# Patient Record
Sex: Male | Born: 1970 | Race: White | Hispanic: No | Marital: Married | Smoking: Former smoker
Health system: Southern US, Community
[De-identification: ages and names within clinical notes are randomized; demographics above are authoritative.]

## PROBLEM LIST (undated history)

## (undated) DIAGNOSIS — I639 Cerebral infarction, unspecified: Secondary | ICD-10-CM

## (undated) DIAGNOSIS — F909 Attention-deficit hyperactivity disorder, unspecified type: Secondary | ICD-10-CM

## (undated) DIAGNOSIS — Z87442 Personal history of urinary calculi: Secondary | ICD-10-CM

## (undated) DIAGNOSIS — E119 Type 2 diabetes mellitus without complications: Secondary | ICD-10-CM

## (undated) DIAGNOSIS — F329 Major depressive disorder, single episode, unspecified: Secondary | ICD-10-CM

## (undated) DIAGNOSIS — F32A Depression, unspecified: Secondary | ICD-10-CM

## (undated) DIAGNOSIS — F419 Anxiety disorder, unspecified: Secondary | ICD-10-CM

## (undated) DIAGNOSIS — I509 Heart failure, unspecified: Secondary | ICD-10-CM

## (undated) HISTORY — DX: Major depressive disorder, single episode, unspecified: F32.9

## (undated) HISTORY — DX: Anxiety disorder, unspecified: F41.9

## (undated) HISTORY — PX: CYSTOSCOPY WITH URETEROSCOPY, STONE BASKETRY AND STENT PLACEMENT: SHX6378

## (undated) HISTORY — DX: Depression, unspecified: F32.A

---

## 2004-03-18 ENCOUNTER — Emergency Department (HOSPITAL_COMMUNITY): Admission: EM | Admit: 2004-03-18 | Discharge: 2004-03-18 | Payer: Self-pay

## 2004-04-15 ENCOUNTER — Ambulatory Visit (HOSPITAL_BASED_OUTPATIENT_CLINIC_OR_DEPARTMENT_OTHER): Admission: RE | Admit: 2004-04-15 | Discharge: 2004-04-15 | Payer: Self-pay | Admitting: Urology

## 2005-03-08 ENCOUNTER — Emergency Department (HOSPITAL_COMMUNITY): Admission: EM | Admit: 2005-03-08 | Discharge: 2005-03-08 | Payer: Self-pay | Admitting: Emergency Medicine

## 2017-01-05 ENCOUNTER — Emergency Department (HOSPITAL_COMMUNITY): Payer: Self-pay

## 2017-01-05 ENCOUNTER — Encounter (HOSPITAL_COMMUNITY): Payer: Self-pay | Admitting: Emergency Medicine

## 2017-01-05 ENCOUNTER — Emergency Department (HOSPITAL_COMMUNITY)
Admission: EM | Admit: 2017-01-05 | Discharge: 2017-01-06 | Disposition: A | Discharge status: Home / Self Care | Payer: Self-pay | Attending: Emergency Medicine | Admitting: Emergency Medicine

## 2017-01-05 DIAGNOSIS — F14150 Cocaine abuse with cocaine-induced psychotic disorder with delusions: Secondary | ICD-10-CM | POA: Insufficient documentation

## 2017-01-05 DIAGNOSIS — Z79899 Other long term (current) drug therapy: Secondary | ICD-10-CM | POA: Insufficient documentation

## 2017-01-05 DIAGNOSIS — R404 Transient alteration of awareness: Secondary | ICD-10-CM | POA: Insufficient documentation

## 2017-01-05 DIAGNOSIS — F17210 Nicotine dependence, cigarettes, uncomplicated: Secondary | ICD-10-CM | POA: Insufficient documentation

## 2017-01-05 LAB — AMMONIA: AMMONIA: 34 umol/L (ref 9–35)

## 2017-01-05 LAB — URINALYSIS, ROUTINE W REFLEX MICROSCOPIC
Bilirubin Urine: NEGATIVE
Glucose, UA: NEGATIVE mg/dL
Hgb urine dipstick: NEGATIVE
KETONES UR: NEGATIVE mg/dL
Leukocytes, UA: NEGATIVE
Nitrite: NEGATIVE
PH: 5 (ref 5.0–8.0)
PROTEIN: 30 mg/dL — AB
SQUAMOUS EPITHELIAL / LPF: NONE SEEN
Specific Gravity, Urine: 1.026 (ref 1.005–1.030)

## 2017-01-05 LAB — CBC WITH DIFFERENTIAL/PLATELET
BASOS ABS: 0 10*3/uL (ref 0.0–0.1)
BASOS PCT: 0 %
EOS ABS: 0 10*3/uL (ref 0.0–0.7)
EOS PCT: 0 %
HCT: 47.6 % (ref 39.0–52.0)
Hemoglobin: 16.4 g/dL (ref 13.0–17.0)
Lymphocytes Relative: 20 %
Lymphs Abs: 3.7 10*3/uL (ref 0.7–4.0)
MCH: 31.1 pg (ref 26.0–34.0)
MCHC: 34.5 g/dL (ref 30.0–36.0)
MCV: 90.3 fL (ref 78.0–100.0)
Monocytes Absolute: 1.4 10*3/uL — ABNORMAL HIGH (ref 0.1–1.0)
Monocytes Relative: 8 %
Neutro Abs: 13.4 10*3/uL — ABNORMAL HIGH (ref 1.7–7.7)
Neutrophils Relative %: 72 %
PLATELETS: 300 10*3/uL (ref 150–400)
RBC: 5.27 MIL/uL (ref 4.22–5.81)
RDW: 13.7 % (ref 11.5–15.5)
WBC: 18.5 10*3/uL — AB (ref 4.0–10.5)

## 2017-01-05 LAB — COMPREHENSIVE METABOLIC PANEL
ALBUMIN: 4.9 g/dL (ref 3.5–5.0)
ALT: 36 U/L (ref 17–63)
AST: 31 U/L (ref 15–41)
Alkaline Phosphatase: 82 U/L (ref 38–126)
Anion gap: 11 (ref 5–15)
BUN: 11 mg/dL (ref 6–20)
CHLORIDE: 106 mmol/L (ref 101–111)
CO2: 22 mmol/L (ref 22–32)
Calcium: 9.4 mg/dL (ref 8.9–10.3)
Creatinine, Ser: 1.03 mg/dL (ref 0.61–1.24)
GFR calc Af Amer: >60 mL/min (ref >60)
GLUCOSE: 123 mg/dL — AB (ref 65–99)
POTASSIUM: 3.9 mmol/L (ref 3.5–5.1)
Sodium: 139 mmol/L (ref 135–145)
Total Bilirubin: 0.7 mg/dL (ref 0.3–1.2)
Total Protein: 8.3 g/dL — ABNORMAL HIGH (ref 6.5–8.1)

## 2017-01-05 LAB — RAPID URINE DRUG SCREEN, HOSP PERFORMED
AMPHETAMINES: NOT DETECTED
BENZODIAZEPINES: NOT DETECTED
Barbiturates: NOT DETECTED
Cocaine: POSITIVE — AB
OPIATES: NOT DETECTED
TETRAHYDROCANNABINOL: POSITIVE — AB

## 2017-01-05 LAB — ETHANOL: Alcohol, Ethyl (B): <5 mg/dL (ref <5)

## 2017-01-05 NOTE — ED Provider Notes (Signed)
[facility]-EMERGENCY DEPT Provider Note   CSN: [id number] Arrival date & time: 01/05/17  2041     History   Chief Complaint Chief Complaint  Patient presents with  . Altered Mental Status    HPI Angel Davies is a 46 y.o. male.  The history is provided by the patient.  Altered Mental Status   This is a new problem. The current episode started 3 to 5 hours ago. The problem has been gradually improving. Associated symptoms include confusion. Risk factors include illicit drug use. His past medical history is significant for psychotropic medication treatment and head trauma (patient reports hitting his head on the banister approximately 1-2pm.). His past medical history does not include seizures, CVA, TIA, AIDS or hypertension.   Last known normal at 1500pm today by her wife. Patient and wife endorsed possible marijuana use today.   Past Medical History:  Diagnosis Date  . Kidney stones     There are no active problems to display for this patient.   History reviewed. No pertinent surgical history.     Home Medications    Prior to Admission medications   Medication Sig Start Date End Date Taking? Authorizing Provider  amphetamine-dextroamphetamine (ADDERALL) 20 MG tablet Take 1 tablet by mouth 4 (four) times daily.   Yes [provider]  clonazePAM (KLONOPIN) 1 MG tablet Take 1 mg by mouth 2 (two) times daily as needed (jitters from adderall).    Yes [provider]    Family History No family history on file.  Social History Social History  Substance Use Topics  . Smoking status: Current Every Day Smoker    Packs/day: 1.00    Types: Cigarettes  . Smokeless tobacco: Never Used  . Alcohol use Yes     Comment: occasionally     Allergies   Patient has no known allergies.   Review of Systems Review of Systems  Unable to perform ROS: Mental status change  Psychiatric/Behavioral: Positive for confusion.     Physical Exam Updated Vital  Signs BP 137/80   Pulse 88   Temp 98.4 F (36.9 C) (Oral)   Resp 18   Ht 5\' 9"  (1.753 m)   Wt 74.8 kg (165 lb)   SpO2 97%   BMI 24.37 kg/m   Physical Exam  Constitutional: He appears well-developed and well-nourished. No distress.  HENT:  Head: Normocephalic and atraumatic.  Nose: Nose normal.  Eyes: Conjunctivae and EOM are normal. Pupils are equal, round, and reactive to light. Right eye exhibits no discharge. Left eye exhibits no discharge. No scleral icterus.  Neck: Normal range of motion. Neck supple.  Cardiovascular: Normal rate and regular rhythm.  Exam reveals no gallop and no friction rub.   No murmur heard. Pulmonary/Chest: Effort normal and breath sounds normal. No stridor. No respiratory distress. He has no rales.  Abdominal: Soft. He exhibits no distension. There is no tenderness.  Musculoskeletal: He exhibits no edema or tenderness.  Neurological: He is alert. He is disoriented.  Mental Status: Alert and oriented to person and place, not to time. Attention and concentration normal. Speech clear. Recent memory is impaired (1 out of 3 word recall)  Cranial Nerves  II Visual Fields: Intact to confrontation. Visual fields intact. III, IV, VI: Pupils equal and reactive to light and near. Full eye movement without nystagmus  V Facial Sensation: Normal. No weakness of masticatory muscles  VII: No facial weakness or asymmetry  VIII Auditory Acuity: Grossly normal  IX/X: The uvula is  midline; the palate elevates symmetrically  XI: Normal sternocleidomastoid and trapezius strength  XII: The tongue is midline. No atrophy or fasciculations.   Motor System: Muscle Strength: 5/5 and symmetric in the upper and lower extremities. No pronation or drift.  Muscle Tone: Tone and muscle bulk are normal in the upper and lower extremities.   Reflexes: DTRs: 1+ and symmetrical in all four extremities. Plantar responses are flexor bilaterally.  Coordination: Intact finger-to-nose,  heel-to-shin. No tremor.  Sensation: Intact to light touch, and pinprick. Negative Romberg test.  Gait: Routine and tandem gait normal.    Skin: Skin is warm and dry. No rash noted. He is not diaphoretic. No erythema.  Psychiatric: He has a normal mood and affect.  Vitals reviewed.    ED Treatments / Results  Labs (all labs ordered are listed, but only abnormal results are displayed) Labs Reviewed  CBC WITH DIFFERENTIAL/PLATELET - Abnormal; Notable for the following:       Result Value   WBC 18.5 (*)    Neutro Abs 13.4 (*)    Monocytes Absolute 1.4 (*)    All other components within normal limits  COMPREHENSIVE METABOLIC PANEL - Abnormal; Notable for the following:    Glucose, Bld 123 (*)    Total Protein 8.3 (*)    All other components within normal limits  RAPID URINE DRUG SCREEN, HOSP PERFORMED - Abnormal; Notable for the following:    Cocaine POSITIVE (*)    Tetrahydrocannabinol POSITIVE (*)    All other components within normal limits  URINALYSIS, ROUTINE W REFLEX MICROSCOPIC - Abnormal; Notable for the following:    APPearance CLOUDY (*)    Protein, ur 30 (*)    Bacteria, UA RARE (*)    All other components within normal limits  AMMONIA  ETHANOL    EKG  EKG Interpretation None       Radiology Ct Head Wo Contrast  Result Date: 01/05/2017 CLINICAL DATA:  Altered mental status.  Trauma today. EXAM: CT HEAD WITHOUT CONTRAST TECHNIQUE: Contiguous axial images were obtained from the base of the skull through the vertex without intravenous contrast. COMPARISON:  None. FINDINGS: Brain: No mass lesion, hemorrhage, hydrocephalus, acute infarct, intra-axial, or extra-axial fluid collection. Vascular: No hyperdense vessel or unexpected calcification. Skull: No significant soft tissue swelling.  No skull fracture. Sinuses/Orbits: Normal imaged portions of the orbits and globes. Other: None. IMPRESSION: Normal head CT. Electronically Signed   By: Jeronimo Greaves M.D.   On:  01/05/2017 21:31    Procedures Procedures (including critical care time)  Medications Ordered in ED Medications - No data to display   Initial Impression / Assessment and Plan / ED Course  I have reviewed the triage vital signs and the nursing notes.  Pertinent labs & imaging results that were available during my care of the patient were reviewed by me and considered in my medical decision making (see chart for details).     Altered mental status workup obtained. Low suspicion for stroke.  With the reported head trauma, CT scan was obtained. CT head without acute intracranial hemorrhage or mass effect. Possible postconcussive syndrome however the mechanism did not seem to be significant enough to cause severe symptoms.   Labs revealed leukocytosis. However, Patient and family denied recent fevers or infections in patient here is afebrile, doubt meningitis.   UDS revealed positive THC and cocaine. Leukocytosis likely secondary to cocaine use. On confronting the patient, he denied any cocaine use but says that it could be possible  that the marijuana that he had smoked was laced. On reassessment. Patient's mental status is improving. This is likely secondary to cocaine-induced psychosis.  Given his improving mental status. Feel that the patient is appropriate for discharge to the care of his family members with strict return precautions.  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 Discharge instructions discussed at great length. The patient was given strict return precautions who verbalized understanding of the instructions. No further questions at time of discharge.    Discharge  Medication List as of 01/05/2017 11:59 PM      Follow Up: Primary care provider  Schedule an appointment as soon as possible for a visit  As needed      Adalberto Metzgar, Amadeo GarnetPedro Eduardo, MD 01/06/17 [phone]

## 2017-01-05 NOTE — ED Triage Notes (Addendum)
Pt comes in with wife with complaints of AMS since 1715.  Pt's wife states today she was speaking to him on the phone and he was slurring his speech.  She came home from work and he was pacing around looking for car keys for a car that he has not had for 6-7 years.  Pt was unable to tell the correct day or current president.  Pt's wife asked patient during triage what kind of car he drove and he did not answer correctly.  Pt was unable to tell me why he was here. He kept saying to ask his wife.  Pt also has small red area noted to left head where he said he hit his head in the storage room today.

## 2017-06-18 ENCOUNTER — Ambulatory Visit (INDEPENDENT_AMBULATORY_CARE_PROVIDER_SITE_OTHER): Payer: Managed Care, Other (non HMO) | Admitting: Neurology

## 2017-06-18 ENCOUNTER — Encounter: Payer: Self-pay | Admitting: Neurology

## 2017-06-18 ENCOUNTER — Encounter (INDEPENDENT_AMBULATORY_CARE_PROVIDER_SITE_OTHER): Payer: Self-pay

## 2017-06-18 VITALS — BP 160/91 | HR 112 | Ht 69.0 in | Wt 195.8 lb

## 2017-06-18 DIAGNOSIS — R51 Headache: Secondary | ICD-10-CM

## 2017-06-18 DIAGNOSIS — F1210 Cannabis abuse, uncomplicated: Secondary | ICD-10-CM

## 2017-06-18 DIAGNOSIS — F1490 Cocaine use, unspecified, uncomplicated: Secondary | ICD-10-CM

## 2017-06-18 DIAGNOSIS — R419 Unspecified symptoms and signs involving cognitive functions and awareness: Secondary | ICD-10-CM

## 2017-06-18 DIAGNOSIS — R410 Disorientation, unspecified: Secondary | ICD-10-CM

## 2017-06-18 DIAGNOSIS — R413 Other amnesia: Secondary | ICD-10-CM | POA: Diagnosis not present

## 2017-06-18 DIAGNOSIS — R519 Headache, unspecified: Secondary | ICD-10-CM

## 2017-06-18 DIAGNOSIS — H5462 Unqualified visual loss, left eye, normal vision right eye: Secondary | ICD-10-CM | POA: Diagnosis not present

## 2017-06-18 NOTE — Progress Notes (Signed)
[facility]    Provider:  Dr Lucia GaskinsAhern Referring Provider: Milagros EvenerKaur, Rupinder, MD Primary Care Physician:  Patient, No Pcp Per  CC:  History of strokes in family  HPI:  Angel Davies is a 46 y.o. male here as a referral from Dr. Evelene CroonKaur for headaches. PMHx depression and anxiety, kidney stones. Here with wife who provides much information. He went to the ED, this happened in June. He hit his head, he fell down some stairs and since then he has vision loss just in the left eye peripherally and headaches. The back of his head hurts. His left hand and foot went numb gradually returned but he is afraid of strokes, but he was laying on his left side which may have caused the problem. His mother had six "massive strokes" and he is scared. He hit his head on the top left (point to the left frontal area), had a significant "knot" on his head. He smokes pot every once in a while, several times a week, he feels he has short-term memory loss. No other drug or alcohol use. He has cut back on his alcohol, at one point he was drinking 5 drinks  2-3 times a week and now is less. He is not using cocaine anymore. No other focal neurologic deficits, associated symptoms, inciting events or modifiable factors.  Reviewed notes, labs and imaging from outside physicians, which showed:    Ct showed No acute intracranial abnormalities including mass lesion or mass effect, hydrocephalus, extra-axial fluid collection, midline shift, hemorrhage, or acute infarction, large ischemic events (personally reviewed images)  Drug screen +cocaine and +THC    Review of Systems: Patient complains of symptoms per HPI as well as the following symptoms: blurred vision, fatigue. Pertinent negatives and positives per HPI. All others negative.   Social History   Socioeconomic History  . Marital status: Married    Spouse name: Not on file  . Number of children: Not on file  . Years of education: Not on file  . Highest  education level: Not on file  Social Needs  . Financial resource strain: Not on file  . Food insecurity - worry: Not on file  . Food insecurity - inability: Not on file  . Transportation needs - medical: Not on file  . Transportation needs - non-medical: Not on file  Occupational History  . Not on file  Tobacco Use  . Smoking status: Current Every Day Smoker    Packs/day: 1.00    Types: Cigarettes  . Smokeless tobacco: Never Used  Substance and Sexual Activity  . Alcohol use: Yes    Comment: occasionally  . Drug use: Yes    Types: Marijuana  . Sexual activity: Not on file  Other Topics Concern  . Not on file  Social History Narrative  . Not on file    Family History  Problem Relation Age of Onset  . Stroke Mother   . Colon cancer Mother   . Prostate cancer Father   . Seizures Neg Hx     Past Medical History:  Diagnosis Date  . Anxiety   . Depression   . Kidney stones     Past Surgical History:  Procedure Laterality Date  . NO PAST SURGERIES      Current Outpatient Medications  Medication Sig Dispense Refill  . amphetamine-dextroamphetamine (ADDERALL) 20 MG tablet Take 1 tablet by mouth 4 (four) times daily.    . clonazePAM (KLONOPIN) 1 MG tablet Take 1 mg by mouth  2 (two) times daily as needed (jitters from adderall).      No current facility-administered medications for this visit.     Allergies as of 06/18/2017  . (No Known Allergies)    Vitals: BP (!) 160/91   Pulse (!) 112   Ht 5\' 9"  (1.753 m)   Wt 195 lb 12.8 oz (88.8 kg)   BMI 28.91 kg/m  Last Weight:  Wt Readings from Last 1 Encounters:  06/18/17 195 lb 12.8 oz (88.8 kg)   Last Height:   Ht Readings from Last 1 Encounters:  06/18/17 5\' 9"  (1.753 m)    Physical exam: Exam: Gen: NAD, conversant, well nourised, obese, well groomed                     CV: RRR, no MRG. No Carotid Bruits. No peripheral edema, warm, nontender Eyes: Conjunctivae clear without exudates or  hemorrhage  Neuro: Detailed Neurologic Exam  Speech:    Speech is normal; fluent and spontaneous with normal comprehension.  Cognition:    The patient is oriented to person, place, and time;     recent and remote memory intact;     language fluent;     normal attention, concentration,     fund of knowledge Cranial Nerves:    The pupils are equal, round, and reactive to light. The fundi are normal and spontaneous venous pulsations are present. Vision loss left upper quadrant left eye. Extraocular movements are intact. Trigeminal sensation is intact and the muscles of mastication are normal. The face is symmetric. The palate elevates in the midline. Hearing intact. Voice is normal. Shoulder shrug is normal. The tongue has normal motion without fasciculations.   Coordination:    Normal finger to nose and heel to shin. Normal rapid alternating movements.   Gait:    Heel-toe and tandem gait are normal.   Motor Observation:    No asymmetry, no atrophy, and no involuntary movements noted. Tone:    Normal muscle tone.    Posture:    Posture is normal. normal erect    Strength:    Strength is V/V in the upper and lower limbs.      Sensation: intact to LT     Reflex Exam:  DTR's:    Deep tendon reflexes in the upper and lower extremities are normal bilaterally.   Toes:    The toes are downgoing bilaterally.   Clonus:    Clonus is absent.     Assessment/Plan:  46 year old male here for left eye loss of vision in the setting of cocaine use, confusion and fall and head trauma.    MRI of the brain w/wo contrast for alteration or awareness, eval for seizure focus, headache, left eye loss to eval for lesion, space occupying mass, compression optic nerve lesion.  Ophthalmology for left eye vision loss, upper outer quandrant left eye impaired on exam  Discussed if he is afraid of having strokes he should probably not do any cocaine, manage risk factors such as blood pressure,  weight, cholesterol, diet, exercise and any other risk factors such as smoking.  Memory loss: smoke marijuana regularly, recommend stopping  Orders Placed This Encounter  Procedures  . MR BRAIN W WO CONTRAST  . Ambulatory referral to Ophthalmology   Discussed: To prevent or relieve headaches, try the following: Cool Compress. Lie down and place a cool compress on your head.  Avoid headache triggers. If certain foods or odors seem to have triggered  your migraines in the past, avoid them. A headache diary might help you identify triggers.  Include physical activity in your daily routine. Try a daily walk or other moderate aerobic exercise.  Manage stress. Find healthy ways to cope with the stressors, such as delegating tasks on your to-do list.  Practice relaxation techniques. Try deep breathing, yoga, massage and visualization.  Eat regularly. Eating regularly scheduled meals and maintaining a healthy diet might help prevent headaches. Also, drink plenty of fluids.  Follow a regular sleep schedule. Sleep deprivation might contribute to headaches Consider biofeedback. With this mind-body technique, you learn to control certain bodily functions - such as muscle tension, heart rate and blood pressure - to prevent headaches or reduce headache pain.    Proceed to emergency room if you experience new or worsening symptoms or symptoms do not resolve, if you have new neurologic symptoms or if headache is severe, or for any concerning symptom.    Naomie DeanAntonia Ica Daye, MD  [facility] [address] [place], [place] [id number]  Phone [phone] Fax [phone]

## 2017-06-18 NOTE — Patient Instructions (Signed)
MRI brain  Primary care Ophthalmology

## 2017-06-20 ENCOUNTER — Encounter: Payer: Self-pay | Admitting: Neurology

## 2017-06-20 DIAGNOSIS — F1490 Cocaine use, unspecified, uncomplicated: Secondary | ICD-10-CM | POA: Insufficient documentation

## 2017-06-20 DIAGNOSIS — F1210 Cannabis abuse, uncomplicated: Secondary | ICD-10-CM | POA: Insufficient documentation

## 2017-07-02 ENCOUNTER — Encounter (HOSPITAL_COMMUNITY): Payer: Self-pay | Admitting: Neurology

## 2017-07-02 ENCOUNTER — Emergency Department (HOSPITAL_COMMUNITY): Payer: Managed Care, Other (non HMO)

## 2017-07-02 ENCOUNTER — Inpatient Hospital Stay (HOSPITAL_COMMUNITY): Payer: Managed Care, Other (non HMO)

## 2017-07-02 ENCOUNTER — Other Ambulatory Visit: Payer: Self-pay

## 2017-07-02 ENCOUNTER — Inpatient Hospital Stay (HOSPITAL_COMMUNITY)
Admission: EM | Admit: 2017-07-02 | Discharge: 2017-07-06 | DRG: 064 | Disposition: A | Discharge status: Rehabilitation Facility | Payer: Managed Care, Other (non HMO) | Attending: Family Medicine | Admitting: Family Medicine

## 2017-07-02 DIAGNOSIS — F1410 Cocaine abuse, uncomplicated: Secondary | ICD-10-CM | POA: Diagnosis present

## 2017-07-02 DIAGNOSIS — I255 Ischemic cardiomyopathy: Secondary | ICD-10-CM | POA: Diagnosis present

## 2017-07-02 DIAGNOSIS — I63531 Cerebral infarction due to unspecified occlusion or stenosis of right posterior cerebral artery: Secondary | ICD-10-CM | POA: Diagnosis present

## 2017-07-02 DIAGNOSIS — Z823 Family history of stroke: Secondary | ICD-10-CM

## 2017-07-02 DIAGNOSIS — F329 Major depressive disorder, single episode, unspecified: Secondary | ICD-10-CM | POA: Diagnosis present

## 2017-07-02 DIAGNOSIS — R471 Dysarthria and anarthria: Secondary | ICD-10-CM | POA: Diagnosis present

## 2017-07-02 DIAGNOSIS — F419 Anxiety disorder, unspecified: Secondary | ICD-10-CM | POA: Diagnosis present

## 2017-07-02 DIAGNOSIS — R29810 Facial weakness: Secondary | ICD-10-CM | POA: Diagnosis present

## 2017-07-02 DIAGNOSIS — R030 Elevated blood-pressure reading, without diagnosis of hypertension: Secondary | ICD-10-CM | POA: Diagnosis present

## 2017-07-02 DIAGNOSIS — I2129 ST elevation (STEMI) myocardial infarction involving other sites: Secondary | ICD-10-CM | POA: Diagnosis not present

## 2017-07-02 DIAGNOSIS — E669 Obesity, unspecified: Secondary | ICD-10-CM | POA: Diagnosis present

## 2017-07-02 DIAGNOSIS — E782 Mixed hyperlipidemia: Secondary | ICD-10-CM | POA: Diagnosis present

## 2017-07-02 DIAGNOSIS — R7303 Prediabetes: Secondary | ICD-10-CM | POA: Diagnosis present

## 2017-07-02 DIAGNOSIS — F32A Depression, unspecified: Secondary | ICD-10-CM | POA: Diagnosis present

## 2017-07-02 DIAGNOSIS — I2510 Atherosclerotic heart disease of native coronary artery without angina pectoris: Secondary | ICD-10-CM | POA: Diagnosis present

## 2017-07-02 DIAGNOSIS — I513 Intracardiac thrombosis, not elsewhere classified: Secondary | ICD-10-CM | POA: Diagnosis not present

## 2017-07-02 DIAGNOSIS — R7309 Other abnormal glucose: Secondary | ICD-10-CM | POA: Diagnosis not present

## 2017-07-02 DIAGNOSIS — F17210 Nicotine dependence, cigarettes, uncomplicated: Secondary | ICD-10-CM | POA: Diagnosis present

## 2017-07-02 DIAGNOSIS — F1090 Alcohol use, unspecified, uncomplicated: Secondary | ICD-10-CM | POA: Diagnosis present

## 2017-07-02 DIAGNOSIS — Z7289 Other problems related to lifestyle: Secondary | ICD-10-CM | POA: Diagnosis present

## 2017-07-02 DIAGNOSIS — I639 Cerebral infarction, unspecified: Secondary | ICD-10-CM | POA: Diagnosis present

## 2017-07-02 DIAGNOSIS — I429 Cardiomyopathy, unspecified: Secondary | ICD-10-CM

## 2017-07-02 DIAGNOSIS — R269 Unspecified abnormalities of gait and mobility: Secondary | ICD-10-CM | POA: Diagnosis not present

## 2017-07-02 DIAGNOSIS — I236 Thrombosis of atrium, auricular appendage, and ventricle as current complications following acute myocardial infarction: Secondary | ICD-10-CM

## 2017-07-02 DIAGNOSIS — Z79899 Other long term (current) drug therapy: Secondary | ICD-10-CM

## 2017-07-02 DIAGNOSIS — F909 Attention-deficit hyperactivity disorder, unspecified type: Secondary | ICD-10-CM | POA: Diagnosis present

## 2017-07-02 DIAGNOSIS — F1210 Cannabis abuse, uncomplicated: Secondary | ICD-10-CM | POA: Diagnosis present

## 2017-07-02 DIAGNOSIS — I5023 Acute on chronic systolic (congestive) heart failure: Secondary | ICD-10-CM | POA: Diagnosis present

## 2017-07-02 DIAGNOSIS — Z789 Other specified health status: Secondary | ICD-10-CM | POA: Diagnosis present

## 2017-07-02 DIAGNOSIS — R791 Abnormal coagulation profile: Secondary | ICD-10-CM | POA: Diagnosis not present

## 2017-07-02 DIAGNOSIS — R29702 NIHSS score 2: Secondary | ICD-10-CM | POA: Diagnosis present

## 2017-07-02 DIAGNOSIS — I672 Cerebral atherosclerosis: Secondary | ICD-10-CM | POA: Diagnosis present

## 2017-07-02 DIAGNOSIS — I6312 Cerebral infarction due to embolism of basilar artery: Secondary | ICD-10-CM | POA: Diagnosis not present

## 2017-07-02 DIAGNOSIS — E785 Hyperlipidemia, unspecified: Secondary | ICD-10-CM

## 2017-07-02 DIAGNOSIS — I69354 Hemiplegia and hemiparesis following cerebral infarction affecting left non-dominant side: Secondary | ICD-10-CM | POA: Diagnosis not present

## 2017-07-02 DIAGNOSIS — Z6829 Body mass index (BMI) 29.0-29.9, adult: Secondary | ICD-10-CM | POA: Diagnosis not present

## 2017-07-02 DIAGNOSIS — I63012 Cerebral infarction due to thrombosis of left vertebral artery: Secondary | ICD-10-CM | POA: Diagnosis not present

## 2017-07-02 DIAGNOSIS — H5462 Unqualified visual loss, left eye, normal vision right eye: Secondary | ICD-10-CM | POA: Diagnosis present

## 2017-07-02 DIAGNOSIS — R739 Hyperglycemia, unspecified: Secondary | ICD-10-CM | POA: Diagnosis present

## 2017-07-02 DIAGNOSIS — I5021 Acute systolic (congestive) heart failure: Secondary | ICD-10-CM

## 2017-07-02 DIAGNOSIS — G8190 Hemiplegia, unspecified affecting unspecified side: Secondary | ICD-10-CM | POA: Diagnosis not present

## 2017-07-02 DIAGNOSIS — I110 Hypertensive heart disease with heart failure: Secondary | ICD-10-CM | POA: Diagnosis present

## 2017-07-02 DIAGNOSIS — I6350 Cerebral infarction due to unspecified occlusion or stenosis of unspecified cerebral artery: Secondary | ICD-10-CM | POA: Diagnosis not present

## 2017-07-02 DIAGNOSIS — M791 Myalgia, unspecified site: Secondary | ICD-10-CM | POA: Diagnosis not present

## 2017-07-02 DIAGNOSIS — G8194 Hemiplegia, unspecified affecting left nondominant side: Secondary | ICD-10-CM | POA: Diagnosis not present

## 2017-07-02 DIAGNOSIS — I6340 Cerebral infarction due to embolism of unspecified cerebral artery: Secondary | ICD-10-CM | POA: Diagnosis not present

## 2017-07-02 DIAGNOSIS — I238 Other current complications following acute myocardial infarction: Secondary | ICD-10-CM | POA: Diagnosis not present

## 2017-07-02 DIAGNOSIS — K649 Unspecified hemorrhoids: Secondary | ICD-10-CM | POA: Diagnosis not present

## 2017-07-02 DIAGNOSIS — D72829 Elevated white blood cell count, unspecified: Secondary | ICD-10-CM | POA: Diagnosis present

## 2017-07-02 DIAGNOSIS — I69398 Other sequelae of cerebral infarction: Secondary | ICD-10-CM | POA: Diagnosis not present

## 2017-07-02 DIAGNOSIS — F1010 Alcohol abuse, uncomplicated: Secondary | ICD-10-CM | POA: Diagnosis present

## 2017-07-02 DIAGNOSIS — I2589 Other forms of chronic ischemic heart disease: Secondary | ICD-10-CM | POA: Diagnosis not present

## 2017-07-02 DIAGNOSIS — I5030 Unspecified diastolic (congestive) heart failure: Secondary | ICD-10-CM | POA: Diagnosis not present

## 2017-07-02 HISTORY — DX: Cerebral infarction, unspecified: I63.9

## 2017-07-02 HISTORY — DX: Personal history of urinary calculi: Z87.442

## 2017-07-02 HISTORY — DX: Attention-deficit hyperactivity disorder, unspecified type: F90.9

## 2017-07-02 LAB — URINALYSIS, ROUTINE W REFLEX MICROSCOPIC
Bilirubin Urine: NEGATIVE
Glucose, UA: NEGATIVE mg/dL
Hgb urine dipstick: NEGATIVE
KETONES UR: NEGATIVE mg/dL
LEUKOCYTES UA: NEGATIVE
NITRITE: NEGATIVE
PH: 5 (ref 5.0–8.0)
Protein, ur: NEGATIVE mg/dL
SPECIFIC GRAVITY, URINE: 1.016 (ref 1.005–1.030)

## 2017-07-02 LAB — COMPREHENSIVE METABOLIC PANEL
ALBUMIN: 4.4 g/dL (ref 3.5–5.0)
ALT: 34 U/L (ref 17–63)
AST: 32 U/L (ref 15–41)
Alkaline Phosphatase: 83 U/L (ref 38–126)
Anion gap: 12 (ref 5–15)
BUN: 12 mg/dL (ref 6–20)
CHLORIDE: 106 mmol/L (ref 101–111)
CO2: 19 mmol/L — ABNORMAL LOW (ref 22–32)
Calcium: 9.6 mg/dL (ref 8.9–10.3)
Creatinine, Ser: 1.14 mg/dL (ref 0.61–1.24)
GFR calc Af Amer: >60 mL/min (ref >60)
GFR calc non Af Amer: >60 mL/min (ref >60)
GLUCOSE: 140 mg/dL — AB (ref 65–99)
POTASSIUM: 4.6 mmol/L (ref 3.5–5.1)
SODIUM: 137 mmol/L (ref 135–145)
Total Bilirubin: 0.6 mg/dL (ref 0.3–1.2)
Total Protein: 7.9 g/dL (ref 6.5–8.1)

## 2017-07-02 LAB — CBC
HCT: 48.4 % (ref 39.0–52.0)
HEMOGLOBIN: 16.4 g/dL (ref 13.0–17.0)
MCH: 31 pg (ref 26.0–34.0)
MCHC: 33.9 g/dL (ref 30.0–36.0)
MCV: 91.5 fL (ref 78.0–100.0)
Platelets: 310 10*3/uL (ref 150–400)
RBC: 5.29 MIL/uL (ref 4.22–5.81)
RDW: 13.8 % (ref 11.5–15.5)
WBC: 13.6 10*3/uL — ABNORMAL HIGH (ref 4.0–10.5)

## 2017-07-02 LAB — I-STAT TROPONIN, ED: TROPONIN I, POC: 0 ng/mL (ref 0.00–0.08)

## 2017-07-02 LAB — DIFFERENTIAL
BASOS ABS: 0 10*3/uL (ref 0.0–0.1)
BASOS PCT: 0 %
EOS ABS: 0 10*3/uL (ref 0.0–0.7)
Eosinophils Relative: 0 %
Lymphocytes Relative: 23 %
Lymphs Abs: 3.1 10*3/uL (ref 0.7–4.0)
Monocytes Absolute: 1.1 10*3/uL — ABNORMAL HIGH (ref 0.1–1.0)
Monocytes Relative: 8 %
NEUTROS PCT: 69 %
Neutro Abs: 9.3 10*3/uL — ABNORMAL HIGH (ref 1.7–7.7)

## 2017-07-02 LAB — I-STAT CHEM 8, ED
BUN: 13 mg/dL (ref 6–20)
CHLORIDE: 106 mmol/L (ref 101–111)
CREATININE: 1 mg/dL (ref 0.61–1.24)
Calcium, Ion: 1.14 mmol/L — ABNORMAL LOW (ref 1.15–1.40)
GLUCOSE: 153 mg/dL — AB (ref 65–99)
HCT: 51 % (ref 39.0–52.0)
Hemoglobin: 17.3 g/dL — ABNORMAL HIGH (ref 13.0–17.0)
POTASSIUM: 4.4 mmol/L (ref 3.5–5.1)
Sodium: 140 mmol/L (ref 135–145)
TCO2: 21 mmol/L — ABNORMAL LOW (ref 22–32)

## 2017-07-02 LAB — PROTIME-INR
INR: 0.96
Prothrombin Time: 12.7 seconds (ref 11.4–15.2)

## 2017-07-02 LAB — APTT: APTT: 28 s (ref 24–36)

## 2017-07-02 LAB — ETHANOL

## 2017-07-02 LAB — CBG MONITORING, ED: Glucose-Capillary: 147 mg/dL — ABNORMAL HIGH (ref 65–99)

## 2017-07-02 MED ORDER — VITAMIN B-1 100 MG PO TABS
100.0000 mg | ORAL_TABLET | Freq: Every day | ORAL | Status: DC
  Administered 2017-07-02 – 2017-07-06 (×4): 100 mg via ORAL
  Filled 2017-07-02 (×4): qty 1

## 2017-07-02 MED ORDER — AMPHETAMINE-DEXTROAMPHETAMINE 10 MG PO TABS
20.0000 mg | ORAL_TABLET | Freq: Four times a day (QID) | ORAL | Status: DC
  Filled 2017-07-02: qty 2

## 2017-07-02 MED ORDER — ACETAMINOPHEN 325 MG PO TABS: 650.0000 mg | ORAL_TABLET | ORAL | Status: DC | PRN

## 2017-07-02 MED ORDER — CLONAZEPAM 0.5 MG PO TABS: 1.0000 mg | ORAL_TABLET | Freq: Three times a day (TID) | ORAL | Status: DC | PRN

## 2017-07-02 MED ORDER — TRAZODONE HCL 50 MG PO TABS
100.0000 mg | ORAL_TABLET | Freq: Every evening | ORAL | Status: DC | PRN
  Filled 2017-07-02 (×2): qty 3

## 2017-07-02 MED ORDER — ASPIRIN 325 MG PO TABS
325.0000 mg | ORAL_TABLET | Freq: Every day | ORAL | Status: DC
  Administered 2017-07-02 – 2017-07-05 (×4): 325 mg via ORAL
  Filled 2017-07-02 (×4): qty 1

## 2017-07-02 MED ORDER — ATORVASTATIN CALCIUM 40 MG PO TABS
40.0000 mg | ORAL_TABLET | Freq: Every day | ORAL | Status: DC
Start: 2017-07-03 — End: 2017-07-03

## 2017-07-02 MED ORDER — LORAZEPAM 1 MG PO TABS: 1.0000 mg | ORAL_TABLET | Freq: Four times a day (QID) | ORAL | Status: AC | PRN

## 2017-07-02 MED ORDER — ASPIRIN 300 MG RE SUPP: 300.0000 mg | Freq: Every day | RECTAL | Status: DC

## 2017-07-02 MED ORDER — NICOTINE 14 MG/24HR TD PT24
14.0000 mg | MEDICATED_PATCH | Freq: Every day | TRANSDERMAL | Status: DC
  Administered 2017-07-03 – 2017-07-06 (×3): 14 mg via TRANSDERMAL
  Filled 2017-07-02 (×4): qty 1

## 2017-07-02 MED ORDER — HYDRALAZINE HCL 20 MG/ML IJ SOLN: 10.0000 mg | Freq: Four times a day (QID) | INTRAMUSCULAR | Status: DC | PRN

## 2017-07-02 MED ORDER — STROKE: EARLY STAGES OF RECOVERY BOOK
Freq: Once | Status: DC
  Filled 2017-07-02: qty 1

## 2017-07-02 MED ORDER — ACETAMINOPHEN 650 MG RE SUPP: 650.0000 mg | RECTAL | Status: DC | PRN

## 2017-07-02 MED ORDER — ENOXAPARIN SODIUM 40 MG/0.4ML ~~LOC~~ SOLN
40.0000 mg | SUBCUTANEOUS | Status: DC
  Administered 2017-07-02 – 2017-07-04 (×3): 40 mg via SUBCUTANEOUS
  Filled 2017-07-02 (×3): qty 0.4

## 2017-07-02 MED ORDER — FOLIC ACID 1 MG PO TABS
1.0000 mg | ORAL_TABLET | Freq: Every day | ORAL | Status: DC
  Administered 2017-07-02 – 2017-07-06 (×4): 1 mg via ORAL
  Filled 2017-07-02 (×4): qty 1

## 2017-07-02 MED ORDER — ACETAMINOPHEN 160 MG/5ML PO SOLN: 650.0000 mg | ORAL | Status: DC | PRN

## 2017-07-02 MED ORDER — ADULT MULTIVITAMIN W/MINERALS CH
1.0000 | ORAL_TABLET | Freq: Every day | ORAL | Status: DC
  Administered 2017-07-02 – 2017-07-06 (×4): 1 via ORAL
  Filled 2017-07-02 (×4): qty 1

## 2017-07-02 MED ORDER — THIAMINE HCL 100 MG/ML IJ SOLN: 100.0000 mg | Freq: Every day | INTRAMUSCULAR | Status: DC

## 2017-07-02 MED ORDER — LORAZEPAM 2 MG/ML IJ SOLN: 1.0000 mg | Freq: Four times a day (QID) | INTRAMUSCULAR | Status: AC | PRN

## 2017-07-02 NOTE — H&P (Signed)
History and Physical    Angel Davies ONG:[id number] DOB: 06/08/1971 DOA: 07/02/2017   PCP: Patient, No Pcp Per   Attending physician: Mariea ClontsEmokpae  Patient coming from/Resides with: Private residence/wife  Chief Complaint: Ataxia/strokelike symptoms  HPI: Angel CooperJames D Davies is a 46 y.o. male with medical history significant for ADHD, obesity, anxiety with depression.  Patient presented via private vehicle with strokelike symptoms.  Reportedly went to bed around 2 AM last night and was normal.  Awakened around 6 AM.  When he stood up he was unable to weight-bear on the left leg and at that time noticed not only left leg weakness but left-sided numbness.  His speech was slurred and he apparently had a left facial droop.  Apparently he did not notify anyone of these abnormalities noting he presented to the ER around 2:54 PM when his mother brought him to the hospital.  According to his stepbrother at bedside his wife was notified around 5312 PM.  Patient admits to drinking half a box of wine last night which is not typical for him.  He also admitted to using cocaine last night as well.  In review of the medical record he has been evaluated by neurology Dr. Lucia GaskinsAhern on 11/19 with a chief complaint of history of strokes in the family.  In review of that note the patient had fallen down in June and gone to the ER.  The fall was preceded by vision loss in the left eye peripherally and was associated with headaches.  At the same time his left hand and foot went numb gradually but returned to baseline.  At that same neurology visit he was also concerned about short-term memory deficits but it was also documented that he has a history of regular use of marijuana 4 times a week.  An outpatient MRI of the brain with and without contrast had been planned for 07/06/17.  CT of the head completed in the ER today revealed small bilateral occipital infarcts that have become apparent since 01/15/17 but the radiologist also  documents that these are chronic in appearance.  On exam patient still has slurred speech, short-term memory deficits at times and drowsiness, left facial drooping, left lower extremity weakness and subtle left upper extremity weakness with noted dysmetria of left upper extremity.  He was mildly hypertensive with a BP of 154/93.  Also was noted with mild hyperglycemia and mild leukocytosis.  Neurology has been consulted.  A code stroke was called but due to no evidence of large vessel occlusion and significant time passing its last seen normal he was not considered a candidate for thrombolytic therapies.  ED Course:  Vital Signs: BP (!) 166/97 (BP Location: Right Arm)   Pulse 97   Temp 97.6 F (36.4 C) (Oral)   Resp 19   Ht 5\' 9"  (1.753 m)   Wt 87.5 kg (193 lb)   SpO2 96%   BMI 28.50 kg/m  CT head without contrast: As above Lab data: Sodium 137, potassium 4.6, chloride 106, CO2 19, glucose 140, BUN 12, creatinine 1.14, anion gap 12, LFTs normal, poc troponin 0 0.00, white count 13,600 with neutrophils 89% and absolute neutrophils 9.3%, hemoglobin 16.4, platelets 310,000, coags normal, serum ETOH and UDS pending. Medications and treatments: None  Review of Systems:  In addition to the HPI above,  No Fever-chills, myalgias or other constitutional symptoms No Headache, changes with Vision or hearing, tingling, dizziness, tremors or seizure activity No problems swallowing food or Liquids, indigestion/reflux, choking  or coughing while eating, abdominal pain with or after eating No Chest pain, Cough or Shortness of Breath, palpitations, orthopnea or DOE No Abdominal pain, N/V, melena,hematochezia, dark tarry stools, constipation No dysuria, malodorous urine, hematuria or flank pain No new skin rashes, lesions, masses or bruises, No new joint pains, aches, swelling or redness No recent unintentional weight gain or loss No polyuria, polydypsia or polyphagia   Past Medical History:  Diagnosis  Date  . Anxiety   . Depression   . Kidney stones     Past Surgical History:  Procedure Laterality Date  . NO PAST SURGERIES      Social History   Socioeconomic History  . Marital status: Married    Spouse name: Not on file  . Number of children: Not on file  . Years of education: Not on file  . Highest education level: Not on file  Social Needs  . Financial resource strain: Not on file  . Food insecurity - worry: Not on file  . Food insecurity - inability: Not on file  . Transportation needs - medical: Not on file  . Transportation needs - non-medical: Not on file  Occupational History  . Not on file  Tobacco Use  . Smoking status: Current Every Day Smoker    Packs/day: 1.00    Types: Cigarettes  . Smokeless tobacco: Never Used  Substance and Sexual Activity  . Alcohol use: Yes    Comment: occasionally  . Drug use: Yes    Types: Marijuana  . Sexual activity: Not on file  Other Topics Concern  . Not on file  Social History Narrative  . 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 Stroke Mother   . Colon cancer Mother   . Prostate cancer Father   . Seizures Neg Hx      Prior to Admission medications   Medication Sig Start Date End Date Taking? Authorizing Provider  acetaminophen (TYLENOL) 500 MG tablet Take 1,000 mg by mouth every 6 (six) hours as needed for headache (pain).   Yes [provider]  amphetamine-dextroamphetamine (ADDERALL) 20 MG tablet Take 20 mg by mouth 4 (four) times daily.    Yes [provider]  clonazePAM (KLONOPIN) 1 MG tablet Take 1 mg by mouth 3 (three) times daily as needed (jitters from adderall).    Yes [provider]  traZODone (DESYREL) 50 MG tablet Take 100-150 mg by mouth at bedtime as needed for sleep.   Yes [provider]    Physical  Exam: Vitals:   07/02/17 1455 07/02/17 1507  BP:  (!) 166/97  Pulse:  97  Resp:  19  Temp:  97.6 F (36.4 C)  TempSrc:  Oral  SpO2:  96%  Weight: 87.5 kg (193 lb)   Height: 5\' 9"  (1.753 m)       Constitutional: NAD, calm, comfortable and somewhat drowsy Eyes: PERRL, lids and conjunctivae normal ENMT: Mucous membranes are moist. Posterior pharynx clear of any exudate or lesions. Normal dentition.  Neck: normal, supple, no masses, no thyromegaly Respiratory: clear to auscultation bilaterally, no wheezing, no crackles. Normal respiratory effort. No accessory muscle use.  Cardiovascular: Regular rate and rhythm, no murmurs / rubs / gallops. No extremity edema. 2+ pedal pulses. No carotid bruits.  Abdomen: no tenderness, no masses palpated. No hepatosplenomegaly. Bowel sounds positive.  Musculoskeletal: no clubbing / cyanosis. No joint deformity upper and lower extremities. Good ROM, no contractures. Normal muscle tone.  Skin: no rashes, lesions, ulcers. No induration Neurologic: CN 2-12 grossly intact except for noted subtle left facial drooping. Sensation intact, DTR normal. Strength 5/5 on the right.  Left upper extremity subtly weak with dysmetria on testing; left lower extremity with both flexor and extensor weakness 4/5 Psychiatric: Normal judgment and insight although at times is somewhat drowsy and has some difficulty with short-term memory recall.  Fully awake he is oriented x 3. Normal mood and once fully awake he began making jokes.    Labs on Admission: I have personally reviewed following labs and imaging studies  CBC: Recent Labs  Lab 07/02/17 1440 07/02/17 1448  WBC 13.6*  --   NEUTROABS 9.3*  --   HGB 16.4 17.3*  HCT 48.4 51.0  MCV 91.5  --   PLT 310  --    Basic Metabolic Panel: Recent Labs  Lab 07/02/17 1440 07/02/17 1448  NA 137 140  K 4.6 4.4  CL 106 106  CO2 19*  --   GLUCOSE 140* 153*  BUN 12 13  CREATININE 1.14 1.00  CALCIUM 9.6  --     GFR: Estimated Creatinine Clearance: 100 mL/min (by C-G formula based on SCr of 1 mg/dL). Liver Function Tests: Recent Labs  Lab 07/02/17 1440  AST 32  ALT 34  ALKPHOS 83  BILITOT 0.6  PROT 7.9  ALBUMIN 4.4   No results for input(s): LIPASE, AMYLASE in the last 168 hours. No results for input(s): AMMONIA in the last 168 hours. Coagulation Profile: Recent Labs  Lab 07/02/17 1440  INR 0.96   Cardiac Enzymes: No results for input(s): CKTOTAL, CKMB, CKMBINDEX, TROPONINI in the last 168 hours. BNP (last 3 results) No results for input(s): PROBNP in the last 8760 hours. HbA1C: No results for input(s): HGBA1C in the last 72 hours. CBG: Recent Labs  Lab 07/02/17 1440  GLUCAP 147*   Lipid Profile: No results for input(s): CHOL, HDL, LDLCALC, TRIG, CHOLHDL, LDLDIRECT in the last 72 hours. Thyroid Function Tests: No results for input(s): TSH, T4TOTAL, FREET4, T3FREE, THYROIDAB in the last 72 hours. Anemia Panel: No results for input(s): VITAMINB12, FOLATE, FERRITIN, TIBC, IRON, RETICCTPCT in the last 72 hours. Urine analysis:    Component Value Date/Time   COLORURINE YELLOW 01/05/2017 2203   APPEARANCEUR CLOUDY (A) 01/05/2017 2203   LABSPEC 1.026 01/05/2017 2203   PHURINE 5.0 01/05/2017 2203   GLUCOSEU NEGATIVE 01/05/2017 2203   HGBUR NEGATIVE 01/05/2017 2203   BILIRUBINUR NEGATIVE 01/05/2017 2203   KETONESUR NEGATIVE 01/05/2017 2203   PROTEINUR 30 (A) 01/05/2017 2203   NITRITE NEGATIVE 01/05/2017 2203   LEUKOCYTESUR NEGATIVE 01/05/2017 2203   Sepsis Labs: @LABRCNTIP (procalcitonin:4,lacticidven:4) )No results found for this or any previous visit (from the past 240 hour(s)).   Radiological Exams on Admission: Ct Head Code Stroke Wo Contrast  Result Date: 07/02/2017 CLINICAL DATA:  Code stroke. Left facial droop and weakness. Slurred speech. EXAM: CT HEAD WITHOUT CONTRAST TECHNIQUE: Contiguous axial images were obtained from the base of the skull through the vertex  without intravenous contrast. COMPARISON:  01/05/2017 FINDINGS: Brain: No evidence of acute infarction, hemorrhage, hydrocephalus, extra-axial collection or mass lesion/mass effect. Chronic small bilateral occipital infarcts. Vascular: No hyperdense vessel. Skull: No acute or aggressive finding Sinuses/Orbits: Negative Other: These results were communicated to Dr. Laurence Slate at 07/02/2017 2:49 pmon 07/02/2017 by text page via the [facility] messaging system. Aspects is  10. IMPRESSION: 1. No acute finding. 2. Small bilateral occipital infarcts that have become apparent since 01/15/2017 but are chronic. Electronically Signed   By: Marnee Spring M.D.   On: 07/02/2017 15:04    EKG: (Independently reviewed) sinus tachycardia with ventricular rate 108 bpm, QTC 487 ms, low voltage R waves in leads V2 and V3 associated elevated J-point; normal R wave rotation and no acute ischemic changes  Assessment/Plan Principal Problem:   CVA (cerebral vascular accident)  -Patient presents with new onset left-sided deficits with LSN around 2 AM -CT head demonstrates chronic bilateral occipital infarcts; EKG and telemetry with sinus rhythm -Telemetry -Positive family history of strokes on maternal side -Echocardiogram/bilateral carotid duplex -FLP/HgbA1c -Frequent neurological checks -NPO until completes swallow evaluation -MRI/MRA brain -Antiplatelet with aspirin -Neurology formally consulted -PT/OT/SLP evaluation  Active Problems:   Acute hyperglycemia -Likely secondary to acute stress from stroke symptoms -Follow-up on HgbA1c -Patient is obese and could have undetected diabetes    Elevated blood-pressure reading without diagnosis of hypertension -Continue to monitor trends -Given acute stroke symptoms will allow for permissive hypertension -IV hydralazine prn SBP>/= 220 or DBP>/= 110    Cocaine abuse/marijuana abuse -Patient admits to using cocaine last night prior to onset of symptoms -Counseled regarding  absolute cessation of both substances -UDS pending    Alcohol use -Patient denies daily use of alcohol -Admitted to drinking one half of a box 1 last night -CIWA prn    Anxiety/Depression -Continue preadmission Klonopin and trazodone    ADHD -Continue preadmission Adderall      DVT prophylaxis: Lovenox Code Status: Full Family Communication: Brother Disposition Plan: Home Consults called: Neurology/Aroor    Russella Dar ANP-BC [facility] Pager [phone]   If 7PM-7AM, please contact night-coverage [email or web address] Password [email or web address]  07/02/2017, 4:02 PM

## 2017-07-02 NOTE — ED Notes (Signed)
Pt aware that urine specimen needed.  Unable to urinate at this time.

## 2017-07-02 NOTE — Consult Note (Addendum)
Requesting Physician: Dr. Rubin Payor    Chief Complaint: Slurred speech & left sided weakness  History obtained from:  Patient and Chart Review  HPI:                                                                                                                                       Angel Davies is an 46 y.o. male with a past medical history of anxiety, depression, tobacco, marijuana and cocaine abuse presenting with complaints of slurred speech and left sided weakness. He reports that last night he was drinking ETOH and used cocaine. He went to bed around 2 AM and had no symptoms at that time. Upon waking this morning around 6 am his wife noted some slurred speech. He went back to sleep and upon waking later (~06-1229 pm) he had left leg weakness and fell trying to get out bed. He denies any UE extremity weakness at any point. He denies any sensory changes initially but now reports numbness in his left fingers and toes. Brother at bedside reports the patient's wife called him around 1230 this afternoon noting that the patient's voice sounded weird and he had fallen. The brother checked on the patient and he was walked to walk but his gait was abnormal like he was dragging that leg. Noted to have slurred speech at that time as well. Patient reports significant family history of strokes on his mother's side; unsure what age.   Date last known well: 07/02/17 Time last known well: 2 AM tPA Given: No, outside tPA window NIHSS: 2 Baseline MRS: 0  Past Medical History:  Diagnosis Date  . Anxiety   . Depression   . Kidney stones     Past Surgical History:  Procedure Laterality Date  . NO PAST SURGERIES      Family History  Problem Relation Age of Onset  . Stroke Mother   . Colon cancer Mother   . Prostate cancer Father   . Seizures Neg Hx    Social History:  reports that he has been smoking cigarettes.  He has been smoking about 1.00 pack per day. he has never used smokeless tobacco. He  reports that he drinks alcohol. He reports that he uses drugs. Drug: Marijuana.  Allergies: No Known Allergies  Medications:  I reviewed home medications   ROS:                                                                                                                                     14 systems reviewed and negative except above    Examination:                                                                                                      General: Appears well-developed and well-nourished.  Psych: Affect appropriate to situation Eyes: No scleral injection HENT: No OP obstrucion Head: Normocephalic.  Cardiovascular: Normal rate and regular rhythm.  Respiratory: Effort normal and breath sounds normal to anterior ascultation GI: Soft.  No distension. There is no tenderness.  Skin: WDI   Neurological Examination Mental Status: Alert, oriented, thought content appropriate.  Speech fluent with mild dysarthria but easily understandable; no evidence of aphasia.  Able to follow 3 step commands without difficulty. Cranial Nerves: II: Discs flat bilaterally; Visual fields grossly normal III,IV, VI: ptosis not present, extra-ocular motions intact bilaterally, pupils equal, round, reactive to light and accommodation V,VII: subtle left facial droop, facial light touch sensation normal bilaterally VIII: hearing normal bilaterally IX,X: uvula rises symmetrically XI: bilateral shoulder shrug XII: midline tongue extension Motor: Right : Upper extremity  5/5  Lower extremity  5/5     Left:     Upper extremity   5/5; mildly decreased left grip strength 4+/5  Lower extremity   5/5; 5/5 with plantar flexion, 4/5 on dorsiflexion Tone and bulk:normal tone throughout; no atrophy noted Sensory: Pinprick and light touch intact throughout, bilaterally Deep Tendon  Reflexes: 2+ and symmetric throughout Plantars: Right: downgoing   Left: downgoing Cerebellar: Mild dysmetria with finger-to-nose on left, normal rapid alternating movements and normal heel-to-shin test Gait: not tested     Lab Results: Basic Metabolic Panel: Recent Labs  Lab 07/02/17 1440 07/02/17 1448  NA 137 140  K 4.6 4.4  CL 106 106  CO2 19*  --   GLUCOSE 140* 153*  BUN 12 13  CREATININE 1.14 1.00  CALCIUM 9.6  --     CBC: Recent Labs  Lab 07/02/17 1440 07/02/17 1448  WBC 13.6*  --   NEUTROABS 9.3*  --   HGB 16.4 17.3*  HCT 48.4 51.0  MCV 91.5  --   PLT 310  --     Coagulation Studies: Recent Labs    07/02/17 1440  LABPROT 12.7  INR 0.96  Imaging: Ct Head Code Stroke Wo Contrast  Result Date: 07/02/2017 CLINICAL DATA:  Code stroke. Left facial droop and weakness. Slurred speech. EXAM: CT HEAD WITHOUT CONTRAST TECHNIQUE: Contiguous axial images were obtained from the base of the skull through the vertex without intravenous contrast. COMPARISON:  01/05/2017 FINDINGS: Brain: No evidence of acute infarction, hemorrhage, hydrocephalus, extra-axial collection or mass lesion/mass effect. Chronic small bilateral occipital infarcts. Vascular: No hyperdense vessel. Skull: No acute or aggressive finding Sinuses/Orbits: Negative Other: These results were communicated to Dr. Laurence SlateAroor at 07/02/2017 2:49 pmon 07/02/2017 by text page via the [facility] messaging system. Aspects is 10. IMPRESSION: 1. No acute finding. 2. Small bilateral occipital infarcts that have become apparent since 01/15/2017 but are chronic. Electronically Signed   By: Marnee SpringJonathon  Watts M.D.   On: 07/02/2017 15:04     ASSESSMENT AND PLAN  46 year old male presenting with slurred speech and left leg weakness. Last known normal approximately 2 AM this morning with symptom onset upon waking this morning around 6 AM. CT head with no acute abnormalities but does show evidence of chronic bilateral occipital infracts.  Deficits seem to be improving based on initial presentation with only mild dysarthria, left dysmetria and mild decreased dorsiflexion on examination. Symptoms concerning for new CVA. Will admit for further stroke workup.    Acute Ischemic Stroke   Risk factors: tobacco use, substance abuse Etiology: needs eval   Recommend # MRI of the brain without contrast #MRA Head and neck  #Transthoracic Echo  # Start patient on ASA 325 mg daily #Start or continue Atorvastatin 80 mg/other high intensity statin # BP goal: permissive HTN upto 210 systolic, PRNs above 210 # HBAIC and Lipid profile # Telemetry monitoring # Frequent neuro checks # NPO until passes stroke swallow screen  Please page stroke NP  Or  PA  Or MD from 8am -4 pm  as this patient from this time will be  followed by the stroke.   You can look them up on [email or web address]  Password [email or web address]   Valentino NoseNathan Boswell, MD Internal Medicine Teaching Program PGY-3   NEUROHOSPITALIST ADDENDUM Seen and examined the patient this AM. Formulated plan as documented above.   [age] m with substance abuse, tobacco abuse, likely HTN presents with left side ataxia and hemiparesis. Likely lacunar stroke ( pontine)- however needs MRI brain and stroke workup to determine etiology.   Georgiana SpinnerSushanth Aroor MD [facility] [phone]  If 7pm to 7am, please call on call as listed on AMION.

## 2017-07-02 NOTE — ED Notes (Signed)
activated code stroke with [facility]- spoke with thomas @ 14:36

## 2017-07-02 NOTE — ED Triage Notes (Signed)
Pt reports went to bed at 0200 last night (LSN), woke up at 0600 with left sided facial droop, slurred speech, left sided weakness. Pt is a x 4. Was ambulatory. His mother brought him here.

## 2017-07-02 NOTE — ED Provider Notes (Signed)
[facility]   CSN: [id number] Arrival date & time: 07/02/17  1428     History   Chief Complaint Chief Complaint  Patient presents with  . Code Stroke    HPI Angel Davies is a 46 y.o. male.  HPI Patient presents with weakness and difficulty speaking.   last normal at 2 AM last night.  Went to sleep at that time.  When he woke up this morning to say goodbye to his wife he was slurring speech.  He then went back to sleep because he had the day off.  Now difficulty walking continued slurred speech.  Has some left-sided facial droop also.  States his left side feels weak.  No headache.  Had a fall a few months ago and is seen by neurology for it.  Seen a couple weeks ago and was scheduled to have an MRI but has not had it yet.  Did have some left-sided weakness at that time.  Also states that his mother has had strokes.  Patient does smoke some marijuana and use cocaine.  Last use cocaine this weekend. Past Medical History:  Diagnosis Date  . Anxiety   . Depression   . Kidney stones     Patient Active Problem List   Diagnosis Date Noted  . Marijuana abuse 06/20/2017  . Cocaine use 06/20/2017    Past Surgical History:  Procedure Laterality Date  . NO PAST SURGERIES         Home Medications    Prior to Admission medications   Medication Sig Start Date End Date Taking? Authorizing Provider  acetaminophen (TYLENOL) 500 MG tablet Take 1,000 mg by mouth every 6 (six) hours as needed for headache (pain).   Yes [provider]  amphetamine-dextroamphetamine (ADDERALL) 20 MG tablet Take 20 mg by mouth 4 (four) times daily.    Yes [provider]  clonazePAM (KLONOPIN) 1 MG tablet Take 1 mg by mouth 3 (three) times daily as needed (jitters from adderall).    Yes [provider]  traZODone (DESYREL) 50 MG tablet Take 100-150 mg by mouth at bedtime as needed for sleep.   Yes [provider]     Family History Family History  Problem Relation Age of Onset  . Stroke Mother   . Colon cancer Mother   . Prostate cancer Father   . Seizures Neg Hx     Social History Social History   Tobacco Use  . Smoking status: Current Every Day Smoker    Packs/day: 1.00    Types: Cigarettes  . Smokeless tobacco: Never Used  Substance Use Topics  . Alcohol use: Yes    Comment: occasionally  . Drug use: Yes    Types: Marijuana     Allergies   Patient has no known allergies.   Review of Systems Review of Systems  Constitutional: Negative for appetite change.  HENT: Negative for congestion.   Eyes: Negative for pain.  Respiratory: Negative for shortness of breath.   Gastrointestinal: Negative for abdominal distention.  Genitourinary: Negative for enuresis.  Musculoskeletal: Negative for arthralgias.  Neurological: Positive for speech difficulty, weakness, numbness and headaches. Negative for syncope.  Hematological: Negative for adenopathy.  Psychiatric/Behavioral: Negative for confusion.     Physical Exam Updated Vital Signs BP (!) 166/97 (BP Location: Right Arm)   Pulse 97   Temp 97.6 F (36.4 C) (Oral)   Resp 19   Ht 5\' 9"  (1.753 m)   Wt  87.5 kg (193 lb)   SpO2 96%   BMI 28.50 kg/m   Physical Exam  Constitutional: He is oriented to person, place, and time. He appears well-developed.  HENT:  Head: Atraumatic.  Eyes: EOM are normal. Pupils are equal, round, and reactive to light.  Neck: Neck supple.  Pulmonary/Chest: Effort normal.  Abdominal: Soft.  Musculoskeletal: He exhibits no edema.  Neurological: He is alert and oriented to person, place, and time.  Patient is awake and appropriate.  Does have drooling out of the left side of his mouth.  Good eyebrow raise bilaterally.  Looks like he may have some difficulty smiling on the right side.   tongue sticks out on the midline.  Good grip strength bilaterally.  However there is some difficulty with  finger-nose and unsteadiness on the left upper extremity.  Sensation grossly intact.  Good straight leg raise bilaterally but some difficulty with heel shin with the left foot on the right shin.  Skin: Skin is warm. Capillary refill takes less than 2 seconds.     ED Treatments / Results  Labs (all labs ordered are listed, but only abnormal results are displayed) Labs Reviewed  CBC - Abnormal; Notable for the following components:      Result Value   WBC 13.6 (*)    All other components within normal limits  DIFFERENTIAL - Abnormal; Notable for the following components:   Neutro Abs 9.3 (*)    Monocytes Absolute 1.1 (*)    All other components within normal limits  I-STAT CHEM 8, ED - Abnormal; Notable for the following components:   Glucose, Bld 153 (*)    Calcium, Ion 1.14 (*)    TCO2 21 (*)    Hemoglobin 17.3 (*)    All other components within normal limits  CBG MONITORING, ED - Abnormal; Notable for the following components:   Glucose-Capillary 147 (*)    All other components within normal limits  PROTIME-INR  APTT  ETHANOL  COMPREHENSIVE METABOLIC PANEL  RAPID URINE DRUG SCREEN, HOSP PERFORMED  URINALYSIS, ROUTINE W REFLEX MICROSCOPIC  I-STAT TROPONIN, ED    EKG  EKG Interpretation  Date/Time:  Monday July 02 2017 15:01:53 EST Ventricular Rate:  108 PR Interval:    QRS Duration: 96 QT Interval:  363 QTC Calculation: 487 R Axis:   56 Text Interpretation:  Sinus tachycardia Anterior infarct, old Borderline repolarization abnormality Confirmed by Benjiman Core [phone]) on 07/02/2017 3:30:03 PM       Radiology Ct Head Code Stroke Wo Contrast  Result Date: 07/02/2017 CLINICAL DATA:  Code stroke. Left facial droop and weakness. Slurred speech. EXAM: CT HEAD WITHOUT CONTRAST TECHNIQUE: Contiguous axial images were obtained from the base of the skull through the vertex without intravenous contrast. COMPARISON:  01/05/2017 FINDINGS: Brain: No evidence of acute  infarction, hemorrhage, hydrocephalus, extra-axial collection or mass lesion/mass effect. Chronic small bilateral occipital infarcts. Vascular: No hyperdense vessel. Skull: No acute or aggressive finding Sinuses/Orbits: Negative Other: These results were communicated to Dr. Laurence Slate at 07/02/2017 2:49 pmon 07/02/2017 by text page via the [facility] messaging system. Aspects is 10. IMPRESSION: 1. No acute finding. 2. Small bilateral occipital infarcts that have become apparent since 01/15/2017 but are chronic. Electronically Signed   By: Marnee Spring M.D.   On: 07/02/2017 15:04    Procedures Procedures (including critical care time)  Medications Ordered in ED Medications - No data to display   Initial Impression / Assessment and Plan / ED Course  I have reviewed  the triage vital signs and the nursing notes.  Pertinent labs & imaging results that were available during my care of the patient were reviewed by me and considered in my medical decision making (see chart for details).     Patient with difficulty speaking and difficulty moving his left side.  Likely a stroke however he is out of the TPA window due to last normal of around 2 in the morning last night.  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  Will admit to medicine with neurology consult.  Final Clinical Impressions(s) / ED Diagnoses   Final diagnoses:  Cerebrovascular accident (CVA), unspecified mechanism [facility](HCC)    ED Discharge Orders    None       Benjiman CorePickering, Sukhman Kocher, MD 07/02/17 [phone]

## 2017-07-02 NOTE — Progress Notes (Signed)
New Admission Note:  Arrival Method: On stretcher from ED Mental Orientation: Alert x $ Telemetry:3w20 Assessment: Completed Skin: assessment complete  IV: L hand Pain:0/10 Safety Measures: Safety Fall Prevention Plan was given, discussed. Admission: Completed 3W20: Patient has been orientated to the room, unit and the staff. Family: At the bedside   Orders have been reviewed and implemented. Will continue to monitor the patient. Call light has been placed within reach and bed alarm has been activated.   Angel Davies ,RN

## 2017-07-03 ENCOUNTER — Inpatient Hospital Stay (HOSPITAL_COMMUNITY): Payer: Managed Care, Other (non HMO)

## 2017-07-03 DIAGNOSIS — F419 Anxiety disorder, unspecified: Secondary | ICD-10-CM

## 2017-07-03 DIAGNOSIS — I639 Cerebral infarction, unspecified: Secondary | ICD-10-CM

## 2017-07-03 DIAGNOSIS — I6340 Cerebral infarction due to embolism of unspecified cerebral artery: Secondary | ICD-10-CM

## 2017-07-03 DIAGNOSIS — F1410 Cocaine abuse, uncomplicated: Secondary | ICD-10-CM

## 2017-07-03 DIAGNOSIS — R030 Elevated blood-pressure reading, without diagnosis of hypertension: Secondary | ICD-10-CM

## 2017-07-03 DIAGNOSIS — I5030 Unspecified diastolic (congestive) heart failure: Secondary | ICD-10-CM

## 2017-07-03 DIAGNOSIS — G8194 Hemiplegia, unspecified affecting left nondominant side: Secondary | ICD-10-CM

## 2017-07-03 DIAGNOSIS — I429 Cardiomyopathy, unspecified: Secondary | ICD-10-CM

## 2017-07-03 DIAGNOSIS — E782 Mixed hyperlipidemia: Secondary | ICD-10-CM

## 2017-07-03 DIAGNOSIS — E785 Hyperlipidemia, unspecified: Secondary | ICD-10-CM

## 2017-07-03 DIAGNOSIS — F909 Attention-deficit hyperactivity disorder, unspecified type: Secondary | ICD-10-CM

## 2017-07-03 LAB — CBC WITH DIFFERENTIAL/PLATELET
BASOS PCT: 0 %
Basophils Absolute: 0 10*3/uL (ref 0.0–0.1)
EOS ABS: 0.2 10*3/uL (ref 0.0–0.7)
Eosinophils Relative: 2 %
HEMATOCRIT: 48.7 % (ref 39.0–52.0)
HEMOGLOBIN: 16.4 g/dL (ref 13.0–17.0)
LYMPHS ABS: 5.1 10*3/uL — AB (ref 0.7–4.0)
Lymphocytes Relative: 40 %
MCH: 30.8 pg (ref 26.0–34.0)
MCHC: 33.7 g/dL (ref 30.0–36.0)
MCV: 91.4 fL (ref 78.0–100.0)
MONOS PCT: 10 %
Monocytes Absolute: 1.3 10*3/uL — ABNORMAL HIGH (ref 0.1–1.0)
NEUTROS ABS: 6 10*3/uL (ref 1.7–7.7)
NEUTROS PCT: 48 %
Platelets: 281 10*3/uL (ref 150–400)
RBC: 5.33 MIL/uL (ref 4.22–5.81)
RDW: 13.7 % (ref 11.5–15.5)
WBC: 12.6 10*3/uL — AB (ref 4.0–10.5)

## 2017-07-03 LAB — BASIC METABOLIC PANEL
Anion gap: 9 (ref 5–15)
BUN: 7 mg/dL (ref 6–20)
CALCIUM: 9.4 mg/dL (ref 8.9–10.3)
CHLORIDE: 105 mmol/L (ref 101–111)
CO2: 23 mmol/L (ref 22–32)
CREATININE: 0.85 mg/dL (ref 0.61–1.24)
GFR calc Af Amer: >60 mL/min (ref >60)
GFR calc non Af Amer: >60 mL/min (ref >60)
GLUCOSE: 143 mg/dL — AB (ref 65–99)
POTASSIUM: 3.8 mmol/L (ref 3.5–5.1)
Sodium: 137 mmol/L (ref 135–145)

## 2017-07-03 LAB — LIPID PANEL
CHOL/HDL RATIO: 9.8 ratio
CHOLESTEROL: 303 mg/dL — AB (ref 0–200)
HDL: 31 mg/dL — AB (ref >40)
LDL Cholesterol: UNDETERMINED mg/dL (ref 0–99)
TRIGLYCERIDES: 539 mg/dL — AB (ref <150)
VLDL: UNDETERMINED mg/dL (ref 0–40)

## 2017-07-03 LAB — HIV ANTIBODY (ROUTINE TESTING W REFLEX): HIV Screen 4th Generation wRfx: NONREACTIVE

## 2017-07-03 LAB — HEMOGLOBIN A1C
Hgb A1c MFr Bld: 6 % — ABNORMAL HIGH (ref 4.8–5.6)
MEAN PLASMA GLUCOSE: 125.5 mg/dL

## 2017-07-03 LAB — RAPID URINE DRUG SCREEN, HOSP PERFORMED
AMPHETAMINES: NOT DETECTED
BARBITURATES: NOT DETECTED
Benzodiazepines: NOT DETECTED
COCAINE: POSITIVE — AB
OPIATES: NOT DETECTED
TETRAHYDROCANNABINOL: POSITIVE — AB

## 2017-07-03 MED ORDER — FENOFIBRATE 160 MG PO TABS
160.0000 mg | ORAL_TABLET | Freq: Every day | ORAL | Status: DC
  Administered 2017-07-03 – 2017-07-06 (×3): 160 mg via ORAL
  Filled 2017-07-03 (×3): qty 1

## 2017-07-03 MED ORDER — IOPAMIDOL (ISOVUE-370) INJECTION 76%
INTRAVENOUS | Status: AC
  Administered 2017-07-03: 50 mL via INTRAVENOUS
  Filled 2017-07-03: qty 50

## 2017-07-03 MED ORDER — ATORVASTATIN CALCIUM 80 MG PO TABS
80.0000 mg | ORAL_TABLET | Freq: Every day | ORAL | Status: DC
  Administered 2017-07-03 – 2017-07-05 (×3): 80 mg via ORAL
  Filled 2017-07-03 (×3): qty 1

## 2017-07-03 NOTE — Progress Notes (Signed)
  Echocardiogram 2D Echocardiogram has been performed.  Angel SavoyCasey N Lynn Davies 07/03/2017, 1:56 PM

## 2017-07-03 NOTE — Progress Notes (Signed)
PROGRESS NOTE  CORDIE BUENING ZOX:[id number] DOB: 1971-07-10 DOA: 07/02/2017 PCP: Patient, No Pcp Per  HPI/Recap of past 24 hours: HPI from Junious Silk NP on 07/02/17  EMIN FOREE is a 46 y.o. male with medical history significant for ADHD, obesity, anxiety with depression.  Patient presented via private vehicle with strokelike symptoms.  Reportedly went to bed, woke up, was unable to bear weight on the left leg and at that time noticed not only left leg weakness but left-sided numbness, slurred speech and a left facial droop. Patient didn't seek any medical attention till later in the afternoon on 07/02/17. Patient admits to drinking half a box of wine and using cocaine the night before presentation. Pt also uses marijuana 4 times a week  Of note, pt had been seen by neurology Dr. Lucia Gaskins on 11/19 with a chief complaint of history of strokes in the family, hx of fall, last in June, which was preceded by vision loss in the left eye peripherally and was associated with headaches. An outpatient MRI of the brain with and without contrast had been planned for 07/06/17. In the ED, CT of the head revealed small bilateral occipital infarcts. Neurology was consulted.  A code stroke was called but pt was not a candidate for thrombolytic therapy  Today, pt reported feeling "ok". Denies any new complaints, no chest pain, SOB, abdominal pain, N/V/D/C, fever/chills. Pt still noted with LUE & LLE weakness, facial droop with improved speech. Wife at bedside.   Assessment/Plan: Principal Problem:   CVA (cerebral vascular accident) (HCC) Active Problems:   Alcohol use   Anxiety   Depression   Acute hyperglycemia   Elevated blood-pressure reading without diagnosis of hypertension   ADHD   Cocaine abuse (HCC)  # Acute right pontine infarct Pt with LUE/LLE weakness, facial droop Strong positive FH of strokes, cocaine abuse, HLD, HTN -CT head demonstrates chronic bilateral occipital infarcts -MRI head:  Acute 2.3 x 1.2 cm nonhemorrhagic RIGHT pontine infarct. Subacute small RIGHT   temporal occipital lobe/PCA territory infarct. Old LEFT occipital lobe/PCA territory infarct.  -CT angio head/neck: Prominent noncalcified atherosclerotic plaque in the left subclavian artery with    up to 40% stenosis. No superimposed ulceration. Intracranial atherosclerosis along the bilateral    carotid siphons with mild left paraclinoid ICA narrowing. Known high-grade right P2 segment    Stenosis -Echocardiogram: LVEF 20-25%, Grade II DD, +RWMA  -Telemetry, EKG SR -Lipid panel: TC 303, TG 539, LDL unable to calculate -A1c: 6, pre-diabetic -ASA 325mg , lipitor 80mg , fenofibrate 160mg  daily -Frequent neuro checks -Neurology consulted, apprec recs -Consider consulting cardiology in am for a recent drop in 20-25%, for possible TEE/loop recorder -PT/OT/SLP evaluation, recommend CIR, consult placed  #HLD -Lipid panel: TC 303, TG 539, LDL unable to calculate -Lipitor + fenofibrate  #Newly diagnosed CHF -Appears euvolemic, ??ischemic -LVEF 20-25%, Grade II DD, +RWMA -Consider consulting cardiology in am for a recent drop in 20-25%, for possible TEE/loop recorder  #Pre-diabetes -A1c 6 -Follow up as outpt, lifestyle modification advised  #??hypertension -Continue to monitor trends -Given acute stroke symptoms will allow for permissive hypertension -IV hydralazine prn SBP>/= 220 or DBP>/= 110  #Polysubstance abuse +Cocaine, marijuana, tobacco abuse -UDS, + for cocaine, THC -Counseled regarding absolute cessation of both substances -Nicotine patch provided  #Alcohol use -Patient denies daily use of alcohol -Admitted to drinking one half of a box of wine on 07/02/17 -CIWA prn -FA, MVT, Thiamine  #Anxiety/Depression -Continue home Klonopin and trazodone  #ADHD -Continue  home Adderall    Code Status: Full  Family Communication: Spoke with wife  Disposition Plan: CIR     Consultants:  Neurology  Procedures:  None   Antimicrobials:  None  DVT prophylaxis:  Lovenox   Objective: Vitals:   07/03/17 0400 07/03/17 0833 07/03/17 1437 07/03/17 1648  BP: 130/86 127/70 (!) 147/82 127/73  Pulse: 91 85 74 86  Resp: 18 18 20 20   Temp: 98.1 F (36.7 C) 98.4 F (36.9 C) 97.9 F (36.6 C) 98.3 F (36.8 C)  TempSrc: Oral Oral Oral Oral  SpO2: 97% 98% 95% 95%  Weight:      Height:        Intake/Output Summary (Last 24 hours) at 07/03/2017 1657 Last data filed at 07/03/2017 0600 Gross per 24 hour  Intake -  Output 400 ml  Net -400 ml   Filed Weights   07/02/17 1455  Weight: 87.5 kg (193 lb)    Exam:   General:  AAO X 3, facial droop, improved speech  Cardiovascular: S1-S2 present, no added heart sound  Respiratory: Chest clear B/L  Abdomen: Soft, non-tender, non-distended, BS present  Musculoskeletal: No pedal edema bilaterally   Skin: Normal  Psychiatry: Depressed mood  Neuro: LUE & LLE 4/5, RUE/RLE 5/5, +dysmetria with finger to nose on left   Data Reviewed: CBC: Recent Labs  Lab 07/02/17 1440 07/02/17 1448 07/03/17 0759  WBC 13.6*  --  12.6*  NEUTROABS 9.3*  --  6.0  HGB 16.4 17.3* 16.4  HCT 48.4 51.0 48.7  MCV 91.5  --  91.4  PLT 310  --  281   Basic Metabolic Panel: Recent Labs  Lab 07/02/17 1440 07/02/17 1448 07/03/17 0759  NA 137 140 137  K 4.6 4.4 3.8  CL 106 106 105  CO2 19*  --  23  GLUCOSE 140* 153* 143*  BUN 12 13 7   CREATININE 1.14 1.00 0.85  CALCIUM 9.6  --  9.4   GFR: Estimated Creatinine Clearance: 117.6 mL/min (by C-G formula based on SCr of 0.85 mg/dL). Liver Function Tests: Recent Labs  Lab 07/02/17 1440  AST 32  ALT 34  ALKPHOS 83  BILITOT 0.6  PROT 7.9  ALBUMIN 4.4   No results for input(s): LIPASE, AMYLASE in the last 168 hours. No results for input(s): AMMONIA in the last 168 hours. Coagulation Profile: Recent Labs  Lab 07/02/17 1440  INR 0.96   Cardiac  Enzymes: No results for input(s): CKTOTAL, CKMB, CKMBINDEX, TROPONINI in the last 168 hours. BNP (last 3 results) No results for input(s): PROBNP in the last 8760 hours. HbA1C: Recent Labs    07/03/17 0233  HGBA1C 6.0*   CBG: Recent Labs  Lab 07/02/17 1440  GLUCAP 147*   Lipid Profile: Recent Labs    07/03/17 0233  CHOL 303*  HDL 31*  LDLCALC UNABLE TO CALCULATE IF TRIGLYCERIDE OVER 400 mg/dL  TRIG [id number]*  CHOLHDL 9.8   Thyroid Function Tests: No results for input(s): TSH, T4TOTAL, FREET4, T3FREE, THYROIDAB in the last 72 hours. Anemia Panel: No results for input(s): VITAMINB12, FOLATE, FERRITIN, TIBC, IRON, RETICCTPCT in the last 72 hours. Urine analysis:    Component Value Date/Time   COLORURINE YELLOW 07/02/2017 1758   APPEARANCEUR CLEAR 07/02/2017 1758   LABSPEC 1.016 07/02/2017 1758   PHURINE 5.0 07/02/2017 1758   GLUCOSEU NEGATIVE 07/02/2017 1758   HGBUR NEGATIVE 07/02/2017 1758   BILIRUBINUR NEGATIVE 07/02/2017 1758   KETONESUR NEGATIVE 07/02/2017 1758   PROTEINUR NEGATIVE 07/02/2017 1758  NITRITE NEGATIVE 07/02/2017 1758   LEUKOCYTESUR NEGATIVE 07/02/2017 1758   Sepsis Labs: @LABRCNTIP (procalcitonin:4,lacticidven:4)  )No results found for this or any previous visit (from the past 240 hour(s)).    Studies: Ct Angio Head W Or Wo Contrast  Result Date: 07/03/2017 CLINICAL DATA:  Stroke follow-up. EXAM: CT ANGIOGRAPHY HEAD AND NECK TECHNIQUE: Multidetector CT imaging of the head and neck was performed using the standard protocol during bolus administration of intravenous contrast. Multiplanar CT image reconstructions and MIPs were obtained to evaluate the vascular anatomy. Carotid stenosis measurements (when applicable) are obtained utilizing NASCET criteria, using the distal internal carotid diameter as the denominator. CONTRAST:  <See Chart> ISOVUE-370 IOPAMIDOL (ISOVUE-370) INJECTION 76%50 cc Isovue 370 intravenous COMPARISON:  Brain MRI and intracranial  MRA from yesterday FINDINGS: CTA NECK FINDINGS Aortic arch: 3 vessel branching. Noncalcified atheromatous wall thickening. No acute finding or dilatation. Right carotid system: Questionable atheromatous wall thickening of the common carotid artery and ICA bulb. No ulceration, dissection, or beading. Left carotid system: No stenosis, ulceration, or beading. No definitive atherosclerosis. Vertebral arteries: Proximal subclavian noncalcified atherosclerosis with up to 40% stenosis. Right dominant vertebral artery. Limited visualization of distal left V1 segment due to neighboring streak artifact from intravenous contrast. No dissection or beading is visualized. Skeleton: No acute or aggressive finding Other neck: No noted mass or inflammation. Upper chest: Centrilobular emphysema. Review of the MIP images confirms the above findings CTA HEAD FINDINGS Anterior circulation: Atheromatous plaque on the carotid siphons that is mild. Mild narrowing at the left paraclinoid ICA segment. No branch occlusion or flow limiting stenosis. Negative for aneurysm; bulbous appearance of the left MCA bifurcation on thick MIPS is related to trifurcation. Reference 3D reformats. Posterior circulation: Dominant right vertebral artery. Most of left vertebral flow is into the left PICA. Dominant rightAICA. No branch occlusion. High-grade right P2 segment stenosis. Negative for aneurysm. Venous sinuses: Patent Anatomic variants: None significant Delayed phase: No abnormal intracranial enhancement. Known acute right pontine infarct. Known bilateral occipital infarcts. Review of the MIP images confirms the above findings IMPRESSION: 1. No emergent vascular finding. 2. Prominent noncalcified atherosclerotic plaque in the left subclavian artery with up to 40% stenosis. No superimposed ulceration. 3. Intracranial atherosclerosis along the bilateral carotid siphons with mild left paraclinoid ICA narrowing. Known high-grade right P2 segment stenosis.  4.  Emphysema (ICD10-J43.9). Electronically Signed   By: Marnee Spring M.D.   On: 07/03/2017 15:03   Ct Angio Neck W Or Wo Contrast  Result Date: 07/03/2017 CLINICAL DATA:  Stroke follow-up. EXAM: CT ANGIOGRAPHY HEAD AND NECK TECHNIQUE: Multidetector CT imaging of the head and neck was performed using the standard protocol during bolus administration of intravenous contrast. Multiplanar CT image reconstructions and MIPs were obtained to evaluate the vascular anatomy. Carotid stenosis measurements (when applicable) are obtained utilizing NASCET criteria, using the distal internal carotid diameter as the denominator. CONTRAST:  <See Chart> ISOVUE-370 IOPAMIDOL (ISOVUE-370) INJECTION 76%50 cc Isovue 370 intravenous COMPARISON:  Brain MRI and intracranial MRA from yesterday FINDINGS: CTA NECK FINDINGS Aortic arch: 3 vessel branching. Noncalcified atheromatous wall thickening. No acute finding or dilatation. Right carotid system: Questionable atheromatous wall thickening of the common carotid artery and ICA bulb. No ulceration, dissection, or beading. Left carotid system: No stenosis, ulceration, or beading. No definitive atherosclerosis. Vertebral arteries: Proximal subclavian noncalcified atherosclerosis with up to 40% stenosis. Right dominant vertebral artery. Limited visualization of distal left V1 segment due to neighboring streak artifact from intravenous contrast. No dissection or beading is visualized. Skeleton:  No acute or aggressive finding Other neck: No noted mass or inflammation. Upper chest: Centrilobular emphysema. Review of the MIP images confirms the above findings CTA HEAD FINDINGS Anterior circulation: Atheromatous plaque on the carotid siphons that is mild. Mild narrowing at the left paraclinoid ICA segment. No branch occlusion or flow limiting stenosis. Negative for aneurysm; bulbous appearance of the left MCA bifurcation on thick MIPS is related to trifurcation. Reference 3D reformats.  Posterior circulation: Dominant right vertebral artery. Most of left vertebral flow is into the left PICA. Dominant rightAICA. No branch occlusion. High-grade right P2 segment stenosis. Negative for aneurysm. Venous sinuses: Patent Anatomic variants: None significant Delayed phase: No abnormal intracranial enhancement. Known acute right pontine infarct. Known bilateral occipital infarcts. Review of the MIP images confirms the above findings IMPRESSION: 1. No emergent vascular finding. 2. Prominent noncalcified atherosclerotic plaque in the left subclavian artery with up to 40% stenosis. No superimposed ulceration. 3. Intracranial atherosclerosis along the bilateral carotid siphons with mild left paraclinoid ICA narrowing. Known high-grade right P2 segment stenosis. 4.  Emphysema (ICD10-J43.9). Electronically Signed   By: Marnee Spring M.D.   On: 07/03/2017 15:03   Mr Brain Wo Contrast  Result Date: 07/02/2017 CLINICAL DATA:  Ataxia.  LEFT-sided numbness.  Suspect stroke. EXAM: MRI HEAD WITHOUT CONTRAST MRA HEAD WITHOUT CONTRAST TECHNIQUE: Multiplanar, multiecho pulse sequences of the brain and surrounding structures were obtained without intravenous contrast. Angiographic images of the head were obtained using MRA technique without contrast. COMPARISON:  CT HEAD July 02, 2017 at 1447 hours FINDINGS: MRI HEAD FINDINGS BRAIN: 2.3 x 1.2 cm reduced diffusion RIGHT pons with low ADC values. Faint reduced diffusion RIGHT temporal occipital lobe with T2 shine through and, intermediate FLAIR signal, minimal T1 shortening consistent with mineralization. Small area mesial LEFT occipital lobe encephalomalacia. No susceptibility artifact to suggest hemorrhage. No midline shift, mass effect or masses. Ventricles and sulci are normal for patient's age. No abnormal extra-axial fluid collections. VASCULAR: See below. SKULL AND UPPER CERVICAL SPINE: No abnormal sellar expansion. No suspicious calvarial bone marrow signal.  Craniocervical junction maintained. SINUSES/ORBITS: Trace paranasal sinus mucosal thickening. Mastoid air cells are well aerated. The included ocular globes and orbital contents are non-suspicious. OTHER: None. MRA HEAD FINDINGS ANTERIOR CIRCULATION: Normal flow related enhancement of the included cervical, petrous, cavernous and supraclinoid internal carotid arteries. Patent anterior communicating artery. Patent anterior and middle cerebral arteries, including distal segments. No large vessel occlusion, flow limiting stenosis, aneurysm. POSTERIOR CIRCULATION: RIGHT vertebral artery is dominant. Basilar artery is patent, with normal flow related enhancement of the main branch vessels. Proximal bilateral posterior cerebral artery's patent. Severe stenosis distal RIGHT P2 segment, moderate irregularity and poor flow related enhancement distal bilateral posterior cerebral artery's. No large vessel occlusion, aneurysm. ANATOMIC VARIANTS: None. Source images and MIP images were reviewed. IMPRESSION: MRI HEAD: 1. 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 2. Subacute small RIGHT temporal occipital lobe/PCA territory infarct. Old LEFT occipital lobe/PCA territory infarct. MRA HEAD: 1. No emergent large vessel occlusion. 2. Severe stenosis distal RIGHT P2 segment and, poor flow related enhancement distal posterior cerebral artery's. Given posterior circulation territory infarcts of varying ages, recommend CTA HEAD and neck to exclude vertebral artery injury and further assess posterior cerebral artery's. These results will be called to the ordering clinician or representative by the Radiologist Assistant, and communication documented in the PACS or zVision Dashboard. Electronically Signed   By: Awilda Metro M.D.   On: 07/02/2017 21:13   Mr [facility]  Wo Contrast  Result Date: 07/02/2017 CLINICAL DATA:  Ataxia.  LEFT-sided numbness.  Suspect stroke. EXAM: MRI HEAD WITHOUT CONTRAST MRA HEAD WITHOUT  CONTRAST TECHNIQUE: Multiplanar, multiecho pulse sequences of the brain and surrounding structures were obtained without intravenous contrast. Angiographic images of the head were obtained using MRA technique without contrast. COMPARISON:  CT HEAD July 02, 2017 at 1447 hours FINDINGS: MRI HEAD FINDINGS BRAIN: 2.3 x 1.2 cm reduced diffusion RIGHT pons with low ADC values. Faint reduced diffusion RIGHT temporal occipital lobe with T2 shine through and, intermediate FLAIR signal, minimal T1 shortening consistent with mineralization. Small area mesial LEFT occipital lobe encephalomalacia. No susceptibility artifact to suggest hemorrhage. No midline shift, mass effect or masses. Ventricles and sulci are normal for patient's age. No abnormal extra-axial fluid collections. VASCULAR: See below. SKULL AND UPPER CERVICAL SPINE: No abnormal sellar expansion. No suspicious calvarial bone marrow signal. Craniocervical junction maintained. SINUSES/ORBITS: Trace paranasal sinus mucosal thickening. Mastoid air cells are well aerated. The included ocular globes and orbital contents are non-suspicious. OTHER: None. MRA HEAD FINDINGS ANTERIOR CIRCULATION: Normal flow related enhancement of the included cervical, petrous, cavernous and supraclinoid internal carotid arteries. Patent anterior communicating artery. Patent anterior and middle cerebral arteries, including distal segments. No large vessel occlusion, flow limiting stenosis, aneurysm. POSTERIOR CIRCULATION: RIGHT vertebral artery is dominant. Basilar artery is patent, with normal flow related enhancement of the main branch vessels. Proximal bilateral posterior cerebral artery's patent. Severe stenosis distal RIGHT P2 segment, moderate irregularity and poor flow related enhancement distal bilateral posterior cerebral artery's. No large vessel occlusion, aneurysm. ANATOMIC VARIANTS: None. Source images and MIP images were reviewed. IMPRESSION: MRI HEAD: 1. Acute 2.3 x 1.2 cm  nonhemorrhagic RIGHT pontine infarct. 2. Subacute small RIGHT temporal occipital lobe/PCA territory infarct. Old LEFT occipital lobe/PCA territory infarct. MRA HEAD: 1. No emergent large vessel occlusion. 2. Severe stenosis distal RIGHT P2 segment and, poor flow related enhancement distal posterior cerebral artery's. Given posterior circulation territory infarcts of varying ages, recommend CTA HEAD and neck to exclude vertebral artery injury and further assess posterior cerebral artery's. These results will be called to the ordering clinician or representative by the Radiologist Assistant, and communication documented in the PACS or zVision Dashboard. Electronically Signed   By: Awilda Metroourtnay  Bloomer M.D.   On: 07/02/2017 21:13    Scheduled Meds: .  stroke: mapping our early stages of recovery book   Does not apply Once  . aspirin  300 mg Rectal Daily   Or  . aspirin  325 mg Oral Daily  . atorvastatin  80 mg Oral q1800  . enoxaparin (LOVENOX) injection  40 mg Subcutaneous Q24H  . fenofibrate  160 mg Oral Daily  . folic acid  1 mg Oral Daily  . multivitamin with minerals  1 tablet Oral Daily  . nicotine  14 mg Transdermal Daily  . thiamine  100 mg Oral Daily    Continuous Infusions:   LOS: 1 day     Briant CedarNkeiruka J Grantley Savage, MD [facility]   If 7PM-7AM, please contact night-coverage [email or web address] Password [email or web address] 07/03/2017, 4:57 PM

## 2017-07-03 NOTE — Evaluation (Signed)
Occupational Therapy Evaluation Patient Details Name: Angel CooperJames D Davies MRN: [id number] DOB: 12/11/1970 Today's Date: 07/03/2017    History of Present Illness 46 y.o. male with medical history significant for ADHD, obesity, anxiety with depression. Presenting with L-sided weakness/numbness, left facial droop, and slurred speech. MRI showing 2.3 x 1.2 cm reduced diffusion RIGHT pons and faint reduced diffusion RIGHT temporal occipital lobe.   Clinical Impression   PTA, pt was living with his wife and was independent with ADLs, driving, and working. Pt presenting with decreased coordination/strength with LUE and LLE, vision deficits, and poor balance. Pt requiring Min A for UB ADLs and Max A for LB ADLs. Pt requiring Mod-Max A for functional mobility due to poor dynamic balance. Pt will require acute OT to facilitate safe dc. Due to pt's age, PLOF, and deficits, recommend dc to CIR for intensive OT to optimize safety, independence, and return to PLOF.    Follow Up Recommendations  CIR    Equipment Recommendations  Other (comment)(Defer to next venue)    Recommendations for Other Services PT consult     Precautions / Restrictions Precautions Precautions: Fall Restrictions Weight Bearing Restrictions: No      Mobility Bed Mobility Overal bed mobility: Needs Assistance Bed Mobility: Supine to Sit     Supine to sit: Min guard     General bed mobility comments: Min Guard for safety  Transfers Overall transfer level: Needs assistance Equipment used: None Transfers: Sit to/from Stand Sit to Stand: Min assist         General transfer comment: Min A for safety and standing balance during sit<>Stand.     Balance Overall balance assessment: Needs assistance Sitting-balance support: No upper extremity supported;Feet supported Sitting balance-Leahy Scale: Fair     Standing balance support: No upper extremity supported;During functional activity Standing balance-Leahy Scale:  Poor Standing balance comment: Pt able to maintain static standing balance with Min A. Poor strength and balance with dynamic movement requiring Mod-Max A                           ADL either performed or assessed with clinical judgement   ADL Overall ADL's : Needs assistance/impaired Eating/Feeding: Set up;Sitting   Grooming: Set up;Sitting   Upper Body Bathing: Minimal assistance;Sitting;Cueing for compensatory techniques Upper Body Bathing Details (indicate cue type and reason): Pt performing UB bathing while seated in recliner with Min A for back and cues for compensatory techniques due to weakness in LUE. Pt able to bring LUE to R axialry area with increased time and effort, pt reporting "It is hard to apply any pressure to clean under my arm." Lower Body Bathing: Maximal assistance;Sit to/from stand   Upper Body Dressing : Minimal assistance;Sitting Upper Body Dressing Details (indicate cue type and reason): Pt doffing/donning gown with Min A for managing gown and VCs for compensatory tehcniques. Donning LUE first. Lower Body Dressing: Maximal assistance;Sit to/from stand Lower Body Dressing Details (indicate cue type and reason): Max A for standing balance during dynamic movements. Max hand over hand to facilitate donning of socks; pt able to put L thumb inside sock and then required Max A to maintain grasp on sock.  Toilet Transfer: Maximal assistance;Ambulation Toilet Transfer Details (indicate cue type and reason): Simulated to recliner. Max A due to poor balance and buckling of knees.         Functional mobility during ADLs: Maximal assistance General ADL Comments: Pt demonstrating significant change from baseline function  and good potential for rehab. Pt requiring increase physical A for ADLs in standing due to poor balance. Demonstrates poor functional use of LUE.     Vision Baseline Vision/History: No visual deficits Patient Visual Report: Blurring of  vision;Other (comment)("Grey area on L visual field") Vision Assessment?: Vision impaired- to be further tested in functional context;Yes Eye Alignment: Within Functional Limits Tracking/Visual Pursuits: Decreased smoothness of horizontal tracking;Decreased smoothness of vertical tracking Convergence: Within functional limits     Perception     Praxis      Pertinent Vitals/Pain Pain Assessment: 0-10 Pain Score: 2  Pain Location: Back Pain Descriptors / Indicators: Constant;Discomfort Pain Intervention(s): Monitored during session;Repositioned     Hand Dominance Right   Extremity/Trunk Assessment Upper Extremity Assessment Upper Extremity Assessment: LUE deficits/detail LUE Deficits / Details: Pt at brunnstrom level 4 with decreased fucnitonal use of LUE. Pt tendency for flexion synergy pattern. Decreased strength and AROM. Difficulty brining LUE to mouth. Poor finger opposition only able to bring thumb to index finger.  LUE Coordination: decreased fine motor;decreased gross motor   Lower Extremity Assessment Lower Extremity Assessment: Defer to PT evaluation       Communication Communication Communication: No difficulties;Other (comment)(Slow processing)   Cognition Arousal/Alertness: Awake/alert Behavior During Therapy: WFL for tasks assessed/performed Overall Cognitive Status: Within Functional Limits for tasks assessed                                 General Comments: Pt oriented and able to verablize needs.    General Comments  Wife present throughout session.    Exercises Exercises: Other exercises Other Exercises Other Exercises: Provided education on flexion synergy patterns. Educating pt on hand and elbow AROM as well as elevation.    Shoulder Instructions      Home Living Family/patient expects to be discharged to:: Private residence Living Arrangements: Parent;Spouse/significant other Available Help at Discharge: Family;Available 24  hours/day Type of Home: House Home Access: Stairs to enter Entergy CorporationEntrance Stairs-Number of Steps: 1   Home Layout: Two level Alternate Level Stairs-Number of Steps: flight   Bathroom Shower/Tub: IT trainerTub/shower unit;Curtain   Bathroom Toilet: Standard     Home Equipment: Tub bench(Has DME for dad)          Prior Functioning/Environment Level of Independence: Independent        Comments: Warehouse work; usually desk work. driving        OT Problem List: Decreased strength;Decreased range of motion;Decreased activity tolerance;Impaired balance (sitting and/or standing);Impaired vision/perception;Decreased safety awareness;Decreased knowledge of use of DME or AE;Decreased knowledge of precautions;Impaired UE functional use      OT Treatment/Interventions: Self-care/ADL training;Therapeutic exercise;Energy conservation;DME and/or AE instruction;Therapeutic activities;Patient/family education    OT Goals(Current goals can be found in the care plan section) Acute Rehab OT Goals Patient Stated Goal: Get back pt PLOF OT Goal Formulation: With patient Time For Goal Achievement: 07/17/17 Potential to Achieve Goals: Good  OT Frequency: Min 2X/week   Barriers to D/C:            Co-evaluation              AM-PAC PT "6 Clicks" Daily Activity     Outcome Measure Help from another person eating meals?: A Little Help from another person taking care of personal grooming?: A Little Help from another person toileting, which includes using toliet, bedpan, or urinal?: A Lot Help from another person bathing (including washing, rinsing, drying)?: A  Lot Help from another person to put on and taking off regular upper body clothing?: A Little Help from another person to put on and taking off regular lower body clothing?: A Lot 6 Click Score: 15   End of Session Equipment Utilized During Treatment: Gait belt Nurse Communication: Mobility status  Activity Tolerance: Patient tolerated  treatment well Patient left: in chair;with call bell/phone within reach;with chair alarm set;with family/visitor present  OT Visit Diagnosis: Unsteadiness on feet (R26.81);Other abnormalities of gait and mobility (R26.89);Muscle weakness (generalized) (M62.81);Hemiplegia and hemiparesis Hemiplegia - Right/Left: Left Hemiplegia - dominant/non-dominant: Non-Dominant Hemiplegia - caused by: Cerebral infarction                Time: 9528-4132 OT Time Calculation (min): 32 min Charges:  OT General Charges $OT Visit: 1 Visit OT Evaluation $OT Eval Moderate Complexity: 1 Mod OT Treatments $Self Care/Home Management : 8-22 mins G-Codes:     Kalin Amrhein MSOT, OTR/L Acute Rehab Pager: [phone] Office: [phone]  Theodoro Grist Jevon Shells 07/03/2017, 10:59 AM

## 2017-07-03 NOTE — Progress Notes (Signed)
STROKE TEAM PROGRESS NOTE   SUBJECTIVE (INTERVAL HISTORY) Wife is at the bedside. Patient is found sitting in chair in NAD  Overall he feels his condition is unchanged. Depressed affect. Voices no new complaints. No new events reported overnight.  OBJECTIVE Lab Results: CBC:  Recent Labs  Lab 07/02/17 1440 07/02/17 1448 07/03/17 0759  WBC 13.6*  --  12.6*  HGB 16.4 17.3* 16.4  HCT 48.4 51.0 48.7  MCV 91.5  --  91.4  PLT 310  --  281   BMP: Recent Labs  Lab 07/02/17 1440 07/02/17 1448 07/03/17 0759  NA 137 140 137  K 4.6 4.4 3.8  CL 106 106 105  CO2 19*  --  23  GLUCOSE 140* 153* 143*  BUN 12 13 7   CREATININE 1.14 1.00 0.85  CALCIUM 9.6  --  9.4   Coagulation Studies:  Recent Labs    07/02/17 1440  APTT 28  INR 0.96   Urine Drug Screen:     Component Value Date/Time   LABOPIA NONE DETECTED 07/02/2017 1758   COCAINSCRNUR POSITIVE (A) 07/02/2017 1758   LABBENZ NONE DETECTED 07/02/2017 1758   AMPHETMU NONE DETECTED 07/02/2017 1758   THCU POSITIVE (A) 07/02/2017 1758   LABBARB NONE DETECTED 07/02/2017 1758    Alcohol Level:  Recent Labs  Lab 07/02/17 1551  ETH <10   PHYSICAL EXAM Temp:  [97.6 F (36.4 C)-98.4 F (36.9 C)] 98.4 F (36.9 C) (12/04 0833) Pulse Rate:  [78-99] 85 (12/04 0833) Resp:  [13-20] 18 (12/04 0833) BP: (120-166)/(62-97) 127/70 (12/04 0833) SpO2:  [93 %-98 %] 98 % (12/04 0833) Weight:  [87.5 kg (193 lb)] 87.5 kg (193 lb) (12/03 1455) General - Well nourished, well developed, in no apparent distress Respiratory - Lungs clear bilaterally. No wheezing. Cardiovascular - Regular rate and rhythm   Neurological Examination Mental Status: Alert, oriented, thought content appropriate.  Speech fluent with mild dysarthria but easily understandable; no evidence of aphasia.  Able to follow 3 step commands without difficulty. Cranial Nerves: II: +Left upper and lower quadrant vision loss III,IV, VI: ptosis not present, extra-ocular motions  intact bilaterally, pupils equal, round, reactive to light and accommodation.  V,VII: subtle left facial droop, facial light touch sensation normal bilaterally VIII: hearing normal bilaterally IX,X: uvula rises symmetrically XI: bilateral shoulder shrug XII: midline tongue extension Motor: Right :  Upper extremity  5/5             Lower extremity  5/5                             Left:     Upper extremity   4/5; mildly decreased left grip strength 4/5             Lower extremity   5/5; 5/5 with plantar flexion, 4/5 on dorsiflexion Tone and bulk:normal tone throughout; no atrophy noted Sensory: Pinprick and light touch intact throughout, bilaterally Deep Tendon Reflexes: 2+ and symmetric throughout Plantars: Right: downgoing                                Left: downgoing Cerebellar: +dysmetria with finger-to-nose on left,  Gait: not tested  IMAGING: I have personally reviewed the radiological images below and agree with the radiology interpretations.  MRI/ MRA Brain& Head Wo Contrast Result Date: 07/02/2017 IMPRESSION: MRI HEAD: 1. 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 2. Subacute small RIGHT temporal occipital lobe/PCA territory infarct. Old LEFT occipital lobe/PCA territory infarct. MRA HEAD: 1. No emergent large vessel occlusion. 2. Severe stenosis distal RIGHT P2 segment and, poor flow related enhancement distal posterior cerebral artery's. Given posterior circulation territory infarcts of varying ages, recommend CTA HEAD and neck to exclude vertebral artery injury and further assess posterior cerebral artery's.   Ct Head Code Stroke Wo Contrast Result Date: 07/02/2017 IMPRESSION: 1. No acute finding. 2. Small bilateral occipital infarcts that have become apparent since 01/15/2017 but are chronic.   Echocardiogram:                                               PENDING CTA Head/Neck:                                                 PENDING _____________________________________________________________________ ASSESSMENT: [name] is a 46 y.o. male with PMH Polysubstance Abuse, Anxiety and Depression presenting with slurred speech, Left arm/leg weakness. CT head with no acute abnormalities but does show evidence of chronic bilateral occipital infracts. 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 CTA Head and Neck are pending along with ECHO w/bubble.  07/03/17: Patient continues to have Left eye vision loss, Left sided weakness, Left facial droop. Imaging thus far and lab results reviewed with patient and wife at bedside. Long discussion regarding stroke risk factors and lifestyle choices. Patient explains that he recently went to see an outpatient Neurologist Dr Lucia GaskinsAhern on 06/18/17 for a complete stroke work up because of his family hx and he has been experiencing some H/A's and Left low quadrant vision loss x few months after experiencing a fall with head trauma. She ordered an MRI Brain schedule for 07/06/17 and a referral to outpatient Opthalmology. Awaiting results of CTA and ECHO to make further recommendations. 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 Stroke Risk Factors: family history, hyperlipidemia, smoking and polysubstance abuse Other Stroke Risk Factors: Cigarette smoker, Polysubstance Abuse, ETOH use, Family hx stroke   Outstanding Stroke Work-up Studies: Echocardiogram w/bubble:                                     PENDING CTA Head/Neck:                                                     PENDING  PLAN  07/03/2017: Continue Aspirin/ Statin & Fenofibrate Ongoing aggressive stroke risk factor management Aggressive PT/OT/SLP CIR Consultation Case Management consultation for PCP Patient counseled to be compliant with his antithrombotic medications  HYPERTENSION:  Stable Permissive hypertension (OK if <220/120) for 24-48 hours post stroke and then gradually normalized within 5-7 days. Long term BP goal normotensive. Home Meds: NONE  HYPERLIPIDEMIA:    Component Value Date/Time   CHOL 303 (H) 07/03/2017 0233   TRIG 539 (H) 07/03/2017 0233   HDL 31 (L) 07/03/2017 0233   CHOLHDL 9.8 07/03/2017 0233   VLDL UNABLE TO CALCULATE IF TRIGLYCERIDE OVER 400 mg/dL 16/05/9603 5409   LDLCALC UNABLE TO CALCULATE IF TRIGLYCERIDE OVER 400 mg/dL 81/19/1478 2956  Home Meds:  NONE LDL  goal < 70 Started on  Lipitor to 80 mg daily and Fenofibrate 160 mg daily Continue statin at discharge  DIABETES: Lab Results  Component Value Date   HGBA1C 6.0 (H) 07/03/2017  HgbA1c goal < 7.0  TOBACCO ABUSE Current smoker Smoking cessation counseling provided Nicotine patch provided   ADHD Adderall 20 mg QID - attempt to decrease dose at discharge   Polysubstance Abuse Counseled to quit CIWA PRN Management per Medicine Team  Other Active Problems: Principal Problem:   CVA (cerebral vascular accident) [facility]) Active Problems:   Alcohol use   Anxiety   Depression   Acute hyperglycemia   Elevated blood-pressure reading without diagnosis of hypertension   ADHD   Cocaine abuse [facility])  Hospital day # 1  VTE prophylaxis: Lovenox  Diet : Diet Heart Room service appropriate? Yes; Fluid consistency: Thin   Prior Home Stroke Medications: No antithrombotic Hospital Current Stroke Medications:  aspirin 325 mg daily and Lipitor 80 mg and Fenofibrate 160 mg Stroke New Meds Plan: Now on aspirin 325 mg daily and Lipitor  80 mg and Fenofibrate 160 mg Discharge Stroke Meds: Please discharge patient on: Aspirin 325 mg daily and Lipitor 80 mg and Fenofibrate 160 mg  Disposition: 01-Home or Self Care Therapy Recs:   Follow Recs:  Follow-up Information    Anson Fret, MD. Schedule an appointment as soon as possible for a visit in 6 week(s).   Specialty:  Neurology Contact information: [address] [place] [address] [phone]          Patient, No Pcp Per- Case Management aware  FAMILY UPDATES: Wife at bedside  TEAM UPDATES: Briant Cedar, MD  Brita Romp Stroke Neurology Team 07/03/2017 12:37 PM   Attending note: I reviewed above note and agree with the assessment and plan. I have made any additions or clarifications directly to the above note. Pt was seen and examined.   46 year old male with history of smoker, anxiety, substance abuse admitted for left sided weakness and slurred speech.  MRI showed acute right pontine infarct, subacute right PCA infarct, and old left PCA infarct.  MRA showed right P2 stenosis and left siphon atherosclerosis.  CTA head and neck confirmed high-grade right P2 stenosis and bilateral carotid siphon atherosclerosis.  TTE showed EF 20-25%.  LDL unable to calculate due to high TG at 539, A1c 6.0.  UDS showed positive cocaine and THC.  His cardiomyopathy may likely due to substance abuse. Recommend cardiology consultation for cardiomyopathy.  He is currently on aspirin and Lipitor 80 with fenofibrate.  Due to stroke with low EF, may consider Coumadin for stroke prevention, and repeat 2D echo in 3 months, if EF more than 30%, may consider to discontinue Coumadin.   Marvel Plan, MD PhD Stroke Neurology 07/03/2017 5:59 PM   To contact Stroke Continuity provider, please refer to [email or web address]. After hours, contact General Neurology

## 2017-07-03 NOTE — Evaluation (Signed)
Speech Language Pathology Evaluation Patient Details Name: Claretha CooperJames D Quist MRN: [id number] DOB: 10/06/1970 Today's Date: 07/03/2017 Time: [id number] SLP Time Calculation (min) (ACUTE ONLY): 35 min  Problem List:  Patient Active Problem List   Diagnosis Date Noted  . CVA (cerebral vascular accident) (HCC) 07/02/2017  . Alcohol use 07/02/2017  . Anxiety 07/02/2017  . Depression 07/02/2017  . Acute hyperglycemia 07/02/2017  . Elevated blood-pressure reading without diagnosis of hypertension 07/02/2017  . ADHD 07/02/2017  . Cocaine abuse (HCC) 07/02/2017  . Marijuana abuse 06/20/2017  . Cocaine use 06/20/2017   Past Medical History:  Past Medical History:  Diagnosis Date  . ADHD (attention deficit hyperactivity disorder)   . Anxiety   . CVA (cerebral vascular accident) (HCC) 07/02/2017   "left sided weakness" (07/02/2017)  . Depression   . History of kidney stones    Past Surgical History:  Past Surgical History:  Procedure Laterality Date  . CYSTOSCOPY WITH URETEROSCOPY, STONE BASKETRY AND STENT PLACEMENT     HPI:  46 y.o.malewith PMH Polysubstance Abuse, Anxiety and Depressionpresenting with slurred speech, Left arm/leg weakness. MRI: 1. 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 2. Subacute small RIGHT temporal occipital lobe/PCA territory infarct. Old LEFT occipital lobe/PCA territory infarct.    Assessment / Plan / Recommendation Clinical Impression  Pt presents primarily with a dysarthria of speech.  Cognitive assessment revealed adequate short-term recall, higher level attention, problem solving, naming fluency, divergent thinking.  Pt with good sense of humor.  Intermittently emotional during assessment, but appropriate to the context of the conversation.  SLP will follow acutely for speech therapy.      SLP Assessment  SLP Recommendation/Assessment: Patient needs continued Speech Lanaguage Pathology Services    Follow Up Recommendations  Inpatient  Rehab    Frequency and Duration min 2x/week  1 week      SLP Evaluation Cognition  Overall Cognitive Status: Within Functional Limits for tasks assessed Arousal/Alertness: Awake/alert Orientation Level: Oriented X4 Attention: Alternating Alternating Attention: Appears intact Memory: Appears intact Awareness: Appears intact Problem Solving: Appears intact Executive Function: Reasoning Reasoning: Appears intact       Comprehension  Auditory Comprehension Overall Auditory Comprehension: Appears within functional limits for tasks assessed    Expression Expression Primary Mode of Expression: Verbal Verbal Expression Overall Verbal Expression: Appears within functional limits for tasks assessed Written Expression Dominant Hand: Right   Oral / Motor  Oral Motor/Sensory Function Overall Oral Motor/Sensory Function: Other (comment)(mild left facial asymmetry) Motor Speech Overall Motor Speech: Impaired Articulation: Impaired Level of Impairment: Conversation Intelligibility: Intelligibility reduced Conversation: 75-100% accurate Motor Planning: Witnin functional limits   GO                    Blenda MountsCouture, Domonick Sittner Laurice 07/03/2017, 3:16 PM

## 2017-07-03 NOTE — Evaluation (Signed)
Physical Therapy Evaluation Patient Details Name: Angel CooperJames D Davies MRN: [id number] DOB: 06/17/1971 Today's Date: 07/03/2017   History of Present Illness  46 y.o. male with medical history significant for ADHD, obesity, anxiety with depression. Presenting with L-sided weakness/numbness, left facial droop, and slurred speech. MRI showing 2.3 x 1.2 cm reduced diffusion RIGHT pons and faint reduced diffusion RIGHT temporal occipital lobe.  Clinical Impression  Pt presents with left hemiparesis, impaired mobility, decreased strength, decreased coordination, and decreased balance secondary to above. Pt tolerates PT treatment session well. Pt demonstrates sit-to-stand with min assist and UE support. Pt ambulates with min assist and RUE support using right handrail in hallway. Pt demonstrates poor motor control, however is able to make corrections with verbal cueing. Pt is educated on weight-bearing through affected UE/LE during sit-to-stand to encourage quicker recovery. Pt is great candidate for CIR, with promising prognosis, strong family support, and high motivation to improve. PT will follow acutely.     Follow Up Recommendations CIR    Equipment Recommendations  None recommended by PT    Recommendations for Other Services       Precautions / Restrictions Precautions Precautions: Fall Restrictions Weight Bearing Restrictions: No      Mobility  Bed Mobility Overal bed mobility: Needs Assistance Bed Mobility: Supine to Sit     Supine to sit: Min assist     General bed mobility comments: Pt utilizes therapists arm with his RUE in order to pull himself up from supine to sitting EOB.   Transfers Overall transfer level: Needs assistance   Transfers: Sit to/from Stand Sit to Stand: Min assist;Min guard         General transfer comment: sit to-stand x5 with min A for safety and standing balance. Pt able to stand from EOB using back of chair for UE support and PT min guard. Pt stands  from chair with right UE support on railing and PT min assist.  Pt educated on placement of LUE on LLE in order to encourage weight bearing through paretic extremities.   Ambulation/Gait Ambulation/Gait assistance: Min assist Ambulation Distance (Feet): 20 Feet(+10 +10) Assistive device: None(railing on right side) Gait Pattern/deviations: Decreased step length - left;Narrow base of support;Step-through pattern;Decreased dorsiflexion - right     General Gait Details: Pt demosntrates abnormal gait pattern, taking large steps with right LE. pt with poor motor control, however pt able to decrease gait speed and take smaller steps with verbal cueing. Pt's left knee buckles during ambulation --> therapist blocks left knee for safety.    Stairs            Wheelchair Mobility    Modified Rankin (Stroke Patients Only)       Balance Overall balance assessment: Needs assistance Sitting-balance support: No upper extremity supported;Feet supported Sitting balance-Leahy Scale: Fair Sitting balance - Comments: pt able to sit EOB with BUEs in lap and shift weight forward.    Standing balance support: Single extremity supported;During functional activity Standing balance-Leahy Scale: Poor Standing balance comment: Pt able to stand with with external support of UEs and/or PT assist. Pt able to shift weight from one LE to the other, but only with external support.                              Pertinent Vitals/Pain Pain Assessment: No/denies pain    Home Living Family/patient expects to be discharged to:: Private residence Living Arrangements: Parent;Spouse/significant other Available Help at  Discharge: Other (Comment)(father who has caregiver support 3x/week s/p cva) Type of Home: House Home Access: Stairs to enter   [employer] of Steps: 1 Home Layout: Two level Home Equipment: Tub bench      Prior Function Level of Independence: Independent          Comments: Warehouse work; usually desk work. driving     Hand Dominance   Dominant Hand: Right    Extremity/Trunk Assessment   Upper Extremity Assessment Upper Extremity Assessment: Defer to OT evaluation;LUE deficits/detail LUE Coordination: decreased fine motor;decreased gross motor    Lower Extremity Assessment Lower Extremity Assessment: LLE deficits/detail LLE Deficits / Details: hip flexion 4-/5, knee extension 4+/5, ankle dorsiflexion 2+/5 LLE Coordination: decreased gross motor;decreased fine motor    Cervical / Trunk Assessment Cervical / Trunk Assessment: Normal  Communication      Cognition Arousal/Alertness: Awake/alert Behavior During Therapy: WFL for tasks assessed/performed;Impulsive Overall Cognitive Status: Within Functional Limits for tasks assessed Area of Impairment: Memory;Problem solving                     Memory: Decreased short-term memory       Problem Solving: Requires verbal cues;Difficulty sequencing General Comments: Pt jokes around intermittently, making cognition difficult to assess. 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 Pt unable to recall many details of OT session earlier in the day. Pt does well with verbal cueing in order to carry out tasks.       General Comments General comments (skin integrity, edema, etc.): Pt's wife present throughout session. Pt used to be a [employer] features editor. Pt motivated to return to prior level of function.     Exercises     Assessment/Plan    PT Assessment Patient needs continued PT services  PT Problem List Decreased strength;Decreased safety awareness;Decreased mobility;Decreased balance;Decreased coordination;Impaired tone;Decreased cognition       PT Treatment Interventions DME instruction;Therapeutic activities;Gait training;Therapeutic exercise;Patient/family education;Stair training;Balance  training;Functional mobility training;Neuromuscular re-education    PT Goals (Current goals can be found in the Care Plan section)  Acute Rehab PT Goals Patient Stated Goal: return to prior level of function PT Goal Formulation: With patient Time For Goal Achievement: 07/17/17 Potential to Achieve Goals: Good    Frequency Min 3X/week   Barriers to discharge        Co-evaluation               AM-PAC PT "6 Clicks" Daily Activity  Outcome Measure Difficulty turning over in bed (including adjusting bedclothes, sheets and blankets)?: None Difficulty moving from lying on back to sitting on the side of the bed? : Unable Difficulty sitting down on and standing up from a chair with arms (e.g., wheelchair, bedside commode, etc,.)?: Unable Help needed moving to and from a bed to chair (including a wheelchair)?: A Little Help needed walking in hospital room?: A Little Help needed climbing 3-5 steps with a railing? : A Lot 6 Click Score: 14    End of Session Equipment Utilized During Treatment: Gait belt Activity Tolerance: Patient tolerated treatment well Patient left: in chair;with family/visitor present;with call bell/phone within reach   PT Visit Diagnosis: Hemiplegia and hemiparesis;Other abnormalities of gait and mobility (R26.89);Difficulty in walking, not elsewhere classified (R26.2) Hemiplegia - Right/Left: Left Hemiplegia - dominant/non-dominant: Dominant Hemiplegia - caused by: Unspecified    Time: 1315-1330 PT Time Calculation (min) (ACUTE ONLY): 15 min  Charges:   PT Evaluation $PT Eval Low Complexity: 1 Low     PT G Codes:        Angel Davies, SPT  Angel Davies 07/03/2017, 3:56 PM

## 2017-07-03 NOTE — Progress Notes (Signed)
Rehab Admissions Coordinator Note:  Patient was screened by Trish MageLogue, Ajahni Nay M for appropriateness for an Inpatient Acute Rehab Consult.  At this time, we are recommending Inpatient Rehab consult.  Trish MageLogue, Ameliyah Sarno M 07/03/2017, 12:17 PM  I can be reached at [phone].

## 2017-07-03 NOTE — Consult Note (Signed)
Physical Medicine and Rehabilitation Consult   Reason for Consult: Left sided weakness and slurred speech Referring Physician: Dr. Pearlean Brownie.    HPI: Angel Davies is a 46 y.o. male with history of anxiety, depression/anxiety, fall 12/2016 with loss of peripheral vision and onset of HA, polysubtance abuse who was admitted on 07/02/17 with fall, slurred speech and left sided weakness. Patient reported that he was drinking ETOH and used cocaine the night before--UDS positive for cocaine and THC. CT head negative. MRI/MRA brain done revealing " Acute RIGHT pontine infarct, Subacute small RIGHT temporal occipital lobe/PCA territory Infarct and severe stenosis distal rigth P2 segment --CTA recommended to rule out [facility] injury.  Work up underway and therapy evaluations doone    Review of Systems  Constitutional: Negative for chills and fever.  HENT: Negative for hearing loss and tinnitus.   Eyes: Negative for blurred vision and double vision.  Respiratory: Negative for cough and shortness of breath.   Cardiovascular: Negative for chest pain and palpitations.  Gastrointestinal: Positive for heartburn. Negative for abdominal pain.  Genitourinary: Negative for dysuria, frequency and urgency.  Musculoskeletal: Negative for back pain and myalgias.  Neurological: Positive for speech change, focal weakness, weakness and headaches.       LLE spasms  Psychiatric/Behavioral: Negative for memory loss. The patient does not have insomnia.       Past Medical History:  Diagnosis Date  . ADHD (attention deficit hyperactivity disorder)   . Anxiety   . CVA (cerebral vascular accident) (HCC) 07/02/2017   "left sided weakness" (07/02/2017)  . Depression   . History of kidney stones     Past Surgical History:  Procedure Laterality Date  . CYSTOSCOPY WITH URETEROSCOPY, STONE BASKETRY AND STENT PLACEMENT      Family History  Problem Relation Age of Onset  . Stroke Mother   . Colon cancer Mother   .  Prostate cancer Father   . Seizures Neg Hx     Social History:  Married. Independent and  reports that he has been smoking cigarettes--1 PPD.  He has a 30.00 pack-year smoking history.  He has never used smokeless tobacco. He reports that he drinks about 2.4 oz of alcohol per week. He reports that he uses drugs. Drugs: Marijuana and "Crack" cocaine.    Allergies: No Known Allergies    Medications Prior to Admission  Medication Sig Dispense Refill  . acetaminophen (TYLENOL) 500 MG tablet Take 1,000 mg by mouth every 6 (six) hours as needed for headache (pain).    Marland Kitchen amphetamine-dextroamphetamine (ADDERALL) 20 MG tablet Take 20 mg by mouth 4 (four) times daily.     . clonazePAM (KLONOPIN) 1 MG tablet Take 1 mg by mouth 3 (three) times daily as needed (jitters from adderall).     . traZODone (DESYREL) 50 MG tablet Take 100-150 mg by mouth at bedtime as needed for sleep.      Home: Home Living Family/patient expects to be discharged to:: Private residence Living Arrangements: Parent, Spouse/significant other Available Help at Discharge: Other (Comment)(father who has caregiver support 3x/week s/p cva) Type of Home: House Home Access: Stairs to enter [employer] of Steps: 1 Home Layout: Two level Alternate Level Stairs-Number of Steps: flight Bathroom Shower/Tub: Tub/shower unit, Engineer, building services: Standard Home Equipment: Tub bench  Lives With: Spouse  Functional History: Prior Function Level of Independence: Independent Comments: Warehouse work; usually desk work. driving Functional Status:  Mobility: Bed Mobility Overal bed mobility: Needs Assistance Bed Mobility:  Supine to Sit Supine to sit: Min assist General bed mobility comments: Pt utilizes therapists arm with his RUE in order to pull himself up from supine to sitting EOB.  Transfers Overall transfer level: Needs assistance Equipment used: None Transfers: Sit to/from Stand Sit to Stand: Min assist,  Min guard General transfer comment: sit to-stand x5 with min A for safety and standing balance. Pt able to stand from EOB using back of chair for UE support and PT min guard. Pt stands from chair with right UE support on railing and PT min assist.  Pt educated on placement of LUE on LLE in order to encourage weight bearing through paretic extremities.  Ambulation/Gait Ambulation/Gait assistance: Min assist Ambulation Distance (Feet): 20 Feet(+10 +10) Assistive device: None(railing on right side) Gait Pattern/deviations: Decreased step length - left, Narrow base of support, Step-through pattern, Decreased dorsiflexion - right General Gait Details: Pt demosntrates abnormal gait pattern, taking large steps with right LE. pt with poor motor control, however pt able to decrease gait speed and take smaller steps with verbal cueing. Pt's left knee buckles during ambulation --> therapist blocks left knee for safety.      ADL: ADL Overall ADL's : Needs assistance/impaired Eating/Feeding: Set up, Sitting Grooming: Set up, Sitting Upper Body Bathing: Minimal assistance, Sitting, Cueing for compensatory techniques Upper Body Bathing Details (indicate cue type and reason): Pt performing UB bathing while seated in recliner with Min A for back and cues for compensatory techniques due to weakness in LUE. Pt able to bring LUE to R axialry area with increased time and effort, pt reporting "It is hard to apply any pressure to clean under my arm." Lower Body Bathing: Maximal assistance, Sit to/from stand Upper Body Dressing : Minimal assistance, Sitting Upper Body Dressing Details (indicate cue type and reason): Pt doffing/donning gown with Min A for managing gown and VCs for compensatory tehcniques. Donning LUE first. Lower Body Dressing: Maximal assistance, Sit to/from stand Lower Body Dressing Details (indicate cue type and reason): Max A for standing balance during dynamic movements. Max hand over hand to  facilitate donning of socks; pt able to put L thumb inside sock and then required Max A to maintain grasp on sock.  Toilet Transfer: Maximal assistance, Ambulation Toilet Transfer Details (indicate cue type and reason): Simulated to recliner. Max A due to poor balance and buckling of knees. Functional mobility during ADLs: Maximal assistance General ADL Comments: Pt demonstrating good potensial for therapy. Pt    Cognition: Cognition Overall Cognitive Status: Within Functional Limits for tasks assessed Arousal/Alertness: Awake/alert Orientation Level: Oriented X4 Attention: Alternating Alternating Attention: Appears intact Memory: Appears intact Awareness: Appears intact Problem Solving: Appears intact Executive Function: Reasoning Reasoning: Appears intact Cognition Arousal/Alertness: Awake/alert Behavior During Therapy: WFL for tasks assessed/performed, Impulsive Overall Cognitive Status: Within Functional Limits for tasks assessed Area of Impairment: Memory, Problem solving Memory: Decreased short-term memory Problem Solving: Requires verbal cues, Difficulty sequencing General Comments: Pt jokes around intermittently, making cognition difficult to assess. 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 Pt unable to recall many details of OT session earlier in the day. Pt does well with verbal cueing in order to carry out tasks.    Blood pressure (!) 147/82, pulse 74, temperature 97.9 F (36.6 C), temperature source Oral, resp. rate 20, height 5\' 9"  (1.753 m), weight 87.5 kg (193 lb), SpO2 95 %. Physical Exam  Nursing note and vitals  reviewed. Constitutional: He is oriented to person, place, and time. He appears well-developed and well-nourished. No distress.  HENT:  Head: Normocephalic and atraumatic.  Eyes: Conjunctivae and EOM are normal. Pupils are equal, round, and reactive to light. Right eye exhibits no  discharge. Left eye exhibits no discharge.  Neck: Normal range of motion. Neck supple.  Cardiovascular: Normal rate and regular rhythm.  Respiratory: Effort normal and breath sounds normal. No stridor. No respiratory distress. He has no wheezes.  GI: Soft. Bowel sounds are normal. He exhibits no distension. There is no tenderness.  Musculoskeletal:  LLE spasms noted  Neurological: He is alert and oriented to person, place, and time.  Left facial weakness with moderate dysarthria. Left visual field deficits unchanged. Able to follow one and two step motor commands. Left sided weakness noted.  Patient is grossly  2- out of 5 left upper extremity proximal to distal.  Left lower extremity 3- out of 5 hip flexors, knee extensors 2 traced distally at the ankle.  Left heel cord tight  Skin: Skin is warm and dry. He is not diaphoretic.  Psychiatric: He has a normal mood and affect. His behavior is normal. Thought content normal.    Results for orders placed or performed during the hospital encounter of 07/02/17 (from the past 24 hour(s))  Urine rapid drug screen (hosp performed)     Status: Abnormal   Collection Time: 07/02/17  5:58 PM  Result Value Ref Range   Opiates NONE DETECTED NONE DETECTED   Cocaine POSITIVE (A) NONE DETECTED   Benzodiazepines NONE DETECTED NONE DETECTED   Amphetamines NONE DETECTED NONE DETECTED   Tetrahydrocannabinol POSITIVE (A) NONE DETECTED   Barbiturates NONE DETECTED NONE DETECTED  Urinalysis, Routine w reflex microscopic     Status: None   Collection Time: 07/02/17  5:58 PM  Result Value Ref Range   Color, Urine YELLOW YELLOW   APPearance CLEAR CLEAR   Specific Gravity, Urine 1.016 1.005 - 1.030   pH 5.0 5.0 - 8.0   Glucose, UA NEGATIVE NEGATIVE mg/dL   Hgb urine dipstick NEGATIVE NEGATIVE   Bilirubin Urine NEGATIVE NEGATIVE   Ketones, ur NEGATIVE NEGATIVE mg/dL   Protein, ur NEGATIVE NEGATIVE mg/dL   Nitrite NEGATIVE NEGATIVE   Leukocytes, UA NEGATIVE  NEGATIVE  Hemoglobin A1c     Status: Abnormal   Collection Time: 07/03/17  2:33 AM  Result Value Ref Range   Hgb A1c MFr Bld 6.0 (H) 4.8 - 5.6 %   Mean Plasma Glucose 125.5 mg/dL  Lipid panel     Status: Abnormal   Collection Time: 07/03/17  2:33 AM  Result Value Ref Range   Cholesterol 303 (H) 0 - 200 mg/dL   Triglycerides 295539 (H) <150 mg/dL   HDL 31 (L) >28>40 mg/dL   Total CHOL/HDL Ratio 9.8 RATIO   VLDL UNABLE TO CALCULATE IF TRIGLYCERIDE OVER 400 mg/dL 0 - 40 mg/dL   LDL Cholesterol UNABLE TO CALCULATE IF TRIGLYCERIDE OVER 400 mg/dL 0 - 99 mg/dL  Basic metabolic panel     Status: Abnormal   Collection Time: 07/03/17  7:59 AM  Result Value Ref Range   Sodium 137 135 - 145 mmol/L   Potassium 3.8 3.5 - 5.1 mmol/L   Chloride 105 101 - 111 mmol/L   CO2 23 22 - 32 mmol/L   Glucose, Bld 143 (H) 65 - 99 mg/dL   BUN 7 6 - 20 mg/dL   Creatinine, Ser 4.130.85 0.61 - 1.24 mg/dL   Calcium  9.4 8.9 - 10.3 mg/dL   GFR calc non Af Amer >60 >60 mL/min   GFR calc Af Amer >60 >60 mL/min   Anion gap 9 5 - 15  CBC with Differential/Platelet     Status: Abnormal   Collection Time: 07/03/17  7:59 AM  Result Value Ref Range   WBC 12.6 (H) 4.0 - 10.5 K/uL   RBC 5.33 4.22 - 5.81 MIL/uL   Hemoglobin 16.4 13.0 - 17.0 g/dL   HCT 69.6 29.5 - 28.4 %   MCV 91.4 78.0 - 100.0 fL   MCH 30.8 26.0 - 34.0 pg   MCHC 33.7 30.0 - 36.0 g/dL   RDW 13.2 44.0 - 10.2 %   Platelets 281 150 - 400 K/uL   Neutrophils Relative % 48 %   Neutro Abs 6.0 1.7 - 7.7 K/uL   Lymphocytes Relative 40 %   Lymphs Abs 5.1 (H) 0.7 - 4.0 K/uL   Monocytes Relative 10 %   Monocytes Absolute 1.3 (H) 0.1 - 1.0 K/uL   Eosinophils Relative 2 %   Eosinophils Absolute 0.2 0.0 - 0.7 K/uL   Basophils Relative 0 %   Basophils Absolute 0.0 0.0 - 0.1 K/uL   Ct Angio Head W Or Wo Contrast  Result Date: 07/03/2017 CLINICAL DATA:  Stroke follow-up. EXAM: CT ANGIOGRAPHY HEAD AND NECK TECHNIQUE: Multidetector CT imaging of the head and neck was  performed using the standard protocol during bolus administration of intravenous contrast. Multiplanar CT image reconstructions and MIPs were obtained to evaluate the vascular anatomy. Carotid stenosis measurements (when applicable) are obtained utilizing NASCET criteria, using the distal internal carotid diameter as the denominator. CONTRAST:  <See Chart> ISOVUE-370 IOPAMIDOL (ISOVUE-370) INJECTION 76%50 cc Isovue 370 intravenous COMPARISON:  Brain MRI and intracranial MRA from yesterday FINDINGS: CTA NECK FINDINGS Aortic arch: 3 vessel branching. Noncalcified atheromatous wall thickening. No acute finding or dilatation. Right carotid system: Questionable atheromatous wall thickening of the common carotid artery and ICA bulb. No ulceration, dissection, or beading. Left carotid system: No stenosis, ulceration, or beading. No definitive atherosclerosis. Vertebral arteries: Proximal subclavian noncalcified atherosclerosis with up to 40% stenosis. Right dominant vertebral artery. Limited visualization of distal left V1 segment due to neighboring streak artifact from intravenous contrast. No dissection or beading is visualized. Skeleton: No acute or aggressive finding Other neck: No noted mass or inflammation. Upper chest: Centrilobular emphysema. Review of the MIP images confirms the above findings CTA HEAD FINDINGS Anterior circulation: Atheromatous plaque on the carotid siphons that is mild. Mild narrowing at the left paraclinoid ICA segment. No branch occlusion or flow limiting stenosis. Negative for aneurysm; bulbous appearance of the left MCA bifurcation on thick MIPS is related to trifurcation. Reference 3D reformats. Posterior circulation: Dominant right vertebral artery. Most of left vertebral flow is into the left PICA. Dominant rightAICA. No branch occlusion. High-grade right P2 segment stenosis. Negative for aneurysm. Venous sinuses: Patent Anatomic variants: None significant Delayed phase: No abnormal  intracranial enhancement. Known acute right pontine infarct. Known bilateral occipital infarcts. Review of the MIP images confirms the above findings IMPRESSION: 1. No emergent vascular finding. 2. Prominent noncalcified atherosclerotic plaque in the left subclavian artery with up to 40% stenosis. No superimposed ulceration. 3. Intracranial atherosclerosis along the bilateral carotid siphons with mild left paraclinoid ICA narrowing. Known high-grade right P2 segment stenosis. 4.  Emphysema (ICD10-J43.9). Electronically Signed   By: Marnee Spring M.D.   On: 07/03/2017 15:03   Ct Angio Neck W Or Wo Contrast  Result  Date: 07/03/2017 CLINICAL DATA:  Stroke follow-up. EXAM: CT ANGIOGRAPHY HEAD AND NECK TECHNIQUE: Multidetector CT imaging of the head and neck was performed using the standard protocol during bolus administration of intravenous contrast. Multiplanar CT image reconstructions and MIPs were obtained to evaluate the vascular anatomy. Carotid stenosis measurements (when applicable) are obtained utilizing NASCET criteria, using the distal internal carotid diameter as the denominator. CONTRAST:  <See Chart> ISOVUE-370 IOPAMIDOL (ISOVUE-370) INJECTION 76%50 cc Isovue 370 intravenous COMPARISON:  Brain MRI and intracranial MRA from yesterday FINDINGS: CTA NECK FINDINGS Aortic arch: 3 vessel branching. Noncalcified atheromatous wall thickening. No acute finding or dilatation. Right carotid system: Questionable atheromatous wall thickening of the common carotid artery and ICA bulb. No ulceration, dissection, or beading. Left carotid system: No stenosis, ulceration, or beading. No definitive atherosclerosis. Vertebral arteries: Proximal subclavian noncalcified atherosclerosis with up to 40% stenosis. Right dominant vertebral artery. Limited visualization of distal left V1 segment due to neighboring streak artifact from intravenous contrast. No dissection or beading is visualized. Skeleton: No acute or aggressive  finding Other neck: No noted mass or inflammation. Upper chest: Centrilobular emphysema. Review of the MIP images confirms the above findings CTA HEAD FINDINGS Anterior circulation: Atheromatous plaque on the carotid siphons that is mild. Mild narrowing at the left paraclinoid ICA segment. No branch occlusion or flow limiting stenosis. Negative for aneurysm; bulbous appearance of the left MCA bifurcation on thick MIPS is related to trifurcation. Reference 3D reformats. Posterior circulation: Dominant right vertebral artery. Most of left vertebral flow is into the left PICA. Dominant rightAICA. No branch occlusion. High-grade right P2 segment stenosis. Negative for aneurysm. Venous sinuses: Patent Anatomic variants: None significant Delayed phase: No abnormal intracranial enhancement. Known acute right pontine infarct. Known bilateral occipital infarcts. Review of the MIP images confirms the above findings IMPRESSION: 1. No emergent vascular finding. 2. Prominent noncalcified atherosclerotic plaque in the left subclavian artery with up to 40% stenosis. No superimposed ulceration. 3. Intracranial atherosclerosis along the bilateral carotid siphons with mild left paraclinoid ICA narrowing. Known high-grade right P2 segment stenosis. 4.  Emphysema (ICD10-J43.9). Electronically Signed   By: Marnee Spring M.D.   On: 07/03/2017 15:03   Mr Brain Wo Contrast  Result Date: 07/02/2017 CLINICAL DATA:  Ataxia.  LEFT-sided numbness.  Suspect stroke. EXAM: MRI HEAD WITHOUT CONTRAST MRA HEAD WITHOUT CONTRAST TECHNIQUE: Multiplanar, multiecho pulse sequences of the brain and surrounding structures were obtained without intravenous contrast. Angiographic images of the head were obtained using MRA technique without contrast. COMPARISON:  CT HEAD July 02, 2017 at 1447 hours FINDINGS: MRI HEAD FINDINGS BRAIN: 2.3 x 1.2 cm reduced diffusion RIGHT pons with low ADC values. Faint reduced diffusion RIGHT temporal occipital lobe  with T2 shine through and, intermediate FLAIR signal, minimal T1 shortening consistent with mineralization. Small area mesial LEFT occipital lobe encephalomalacia. No susceptibility artifact to suggest hemorrhage. No midline shift, mass effect or masses. Ventricles and sulci are normal for patient's age. No abnormal extra-axial fluid collections. VASCULAR: See below. SKULL AND UPPER CERVICAL SPINE: No abnormal sellar expansion. No suspicious calvarial bone marrow signal. Craniocervical junction maintained. SINUSES/ORBITS: Trace paranasal sinus mucosal thickening. Mastoid air cells are well aerated. The included ocular globes and orbital contents are non-suspicious. OTHER: None. MRA HEAD FINDINGS ANTERIOR CIRCULATION: Normal flow related enhancement of the included cervical, petrous, cavernous and supraclinoid internal carotid arteries. Patent anterior communicating artery. Patent anterior and middle cerebral arteries, including distal segments. No large vessel occlusion, flow limiting stenosis, aneurysm. POSTERIOR CIRCULATION: RIGHT vertebral artery  is dominant. Basilar artery is patent, with normal flow related enhancement of the main branch vessels. Proximal bilateral posterior cerebral artery's patent. Severe stenosis distal RIGHT P2 segment, moderate irregularity and poor flow related enhancement distal bilateral posterior cerebral artery's. No large vessel occlusion, aneurysm. ANATOMIC VARIANTS: None. Source images and MIP images were reviewed. IMPRESSION: MRI HEAD: 1. 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 2. Subacute small RIGHT temporal occipital lobe/PCA territory infarct. Old LEFT occipital lobe/PCA territory infarct. MRA HEAD: 1. No emergent large vessel occlusion. 2. Severe stenosis distal RIGHT P2 segment and, poor flow related enhancement distal posterior cerebral artery's. Given posterior circulation territory infarcts of varying ages, recommend CTA HEAD and neck to exclude vertebral  artery injury and further assess posterior cerebral artery's. These results will be called to the ordering clinician or representative by the Radiologist Assistant, and communication documented in the PACS or zVision Dashboard. Electronically Signed   By: Awilda Metro M.D.   On: 07/02/2017 21:13   Mr Maxine Glenn Head Wo Contrast  Result Date: 07/02/2017 CLINICAL DATA:  Ataxia.  LEFT-sided numbness.  Suspect stroke. EXAM: MRI HEAD WITHOUT CONTRAST MRA HEAD WITHOUT CONTRAST TECHNIQUE: Multiplanar, multiecho pulse sequences of the brain and surrounding structures were obtained without intravenous contrast. Angiographic images of the head were obtained using MRA technique without contrast. COMPARISON:  CT HEAD July 02, 2017 at 1447 hours FINDINGS: MRI HEAD FINDINGS BRAIN: 2.3 x 1.2 cm reduced diffusion RIGHT pons with low ADC values. Faint reduced diffusion RIGHT temporal occipital lobe with T2 shine through and, intermediate FLAIR signal, minimal T1 shortening consistent with mineralization. Small area mesial LEFT occipital lobe encephalomalacia. No susceptibility artifact to suggest hemorrhage. No midline shift, mass effect or masses. Ventricles and sulci are normal for patient's age. No abnormal extra-axial fluid collections. VASCULAR: See below. SKULL AND UPPER CERVICAL SPINE: No abnormal sellar expansion. No suspicious calvarial bone marrow signal. Craniocervical junction maintained. SINUSES/ORBITS: Trace paranasal sinus mucosal thickening. Mastoid air cells are well aerated. The included ocular globes and orbital contents are non-suspicious. OTHER: None. MRA HEAD FINDINGS ANTERIOR CIRCULATION: Normal flow related enhancement of the included cervical, petrous, cavernous and supraclinoid internal carotid arteries. Patent anterior communicating artery. Patent anterior and middle cerebral arteries, including distal segments. No large vessel occlusion, flow limiting stenosis, aneurysm. POSTERIOR CIRCULATION: RIGHT  vertebral artery is dominant. Basilar artery is patent, with normal flow related enhancement of the main branch vessels. Proximal bilateral posterior cerebral artery's patent. Severe stenosis distal RIGHT P2 segment, moderate irregularity and poor flow related enhancement distal bilateral posterior cerebral artery's. No large vessel occlusion, aneurysm. ANATOMIC VARIANTS: None. Source images and MIP images were reviewed. IMPRESSION: MRI HEAD: 1. 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 2. Subacute small RIGHT temporal occipital lobe/PCA territory infarct. Old LEFT occipital lobe/PCA territory infarct. MRA HEAD: 1. No emergent large vessel occlusion. 2. Severe stenosis distal RIGHT P2 segment and, poor flow related enhancement distal posterior cerebral artery's. Given posterior circulation territory infarcts of varying ages, recommend CTA HEAD and neck to exclude vertebral artery injury and further assess posterior cerebral artery's. These results will be called to the ordering clinician or representative by the Radiologist Assistant, and communication documented in the PACS or zVision Dashboard. Electronically Signed   By: Awilda Metro M.D.   On: 07/02/2017 21:13   Ct Head Code Stroke Wo Contrast  Result Date: 07/02/2017 CLINICAL DATA:  Code stroke. Left facial droop and weakness. Slurred speech. EXAM: CT HEAD WITHOUT CONTRAST TECHNIQUE: Contiguous  axial images were obtained from the base of the skull through the vertex without intravenous contrast. COMPARISON:  01/05/2017 FINDINGS: Brain: No evidence of acute infarction, hemorrhage, hydrocephalus, extra-axial collection or mass lesion/mass effect. Chronic small bilateral occipital infarcts. Vascular: No hyperdense vessel. Skull: No acute or aggressive finding Sinuses/Orbits: Negative Other: These results were communicated to Dr. Laurence Slate at 07/02/2017 2:49 pmon 07/02/2017 by text page via the [facility] messaging system. Aspects is 10. IMPRESSION: 1.  No acute finding. 2. Small bilateral occipital infarcts that have become apparent since 01/15/2017 but are chronic. Electronically Signed   By: Marnee Spring M.D.   On: 07/02/2017 15:04    Assessment/Plan: Diagnosis: AcuteRight pontine/subacute right temporal occipital infarcts with left hemiparesis 1. Does the need for close, 24 hr/day medical supervision in concert with the patient's rehab needs make it unreasonable for this patient to be served in a less intensive setting? Yes 2. Co-Morbidities requiring supervision/potential complications: Alcohol use, cocaine abuse, hypertension 3. Due to bladder management, bowel management, safety, skin/wound care, disease management, medication administration, pain management and patient education, does the patient require 24 hr/day rehab nursing? Yes 4. Does the patient require coordinated care of a physician, rehab nurse, PT (1-2 hrs/day, 5 days/week), OT (1-2 hrs/day, 5 days/week) and SLP (1-2 hrs/day, 5 days/week) to address physical and functional deficits in the context of the above medical diagnosis(es)? Yes Addressing deficits in the following areas: balance, endurance, locomotion, strength, transferring, bowel/bladder control, bathing, dressing, feeding, grooming, toileting, speech and psychosocial support 5. Can the patient actively participate in an intensive therapy program of at least 3 hrs of therapy per day at least 5 days per week? Yes 6. The potential for patient to make measurable gains while on inpatient rehab is excellent 7. Anticipated functional outcomes upon discharge from inpatient rehab are modified independent and supervision  with PT, modified independent and supervision with OT, modified independent with SLP. 8. Estimated rehab length of stay to reach the above functional goals is: 13-18 days 9. Anticipated D/C setting: Home 10. Anticipated post D/C treatments: HH therapy and Outpatient therapy 11. Overall Rehab/Functional  Prognosis: excellent  RECOMMENDATIONS: This patient's condition is appropriate for continued rehabilitative care in the following setting: CIR Patient has agreed to participate in recommended program. Yes Note that insurance prior authorization may be required for reimbursement for recommended care.  Comment: Rehab Admissions Coordinator to follow up.  Thanks,  Ranelle Oyster, MD, Earlie Counts, PA-C 07/03/2017

## 2017-07-03 NOTE — Progress Notes (Signed)
Pt was found sitting on the floor in his room near the bathroom.  He has urine all over his gown.  Fall was witnessed by his wife.  Staff was alerted by alarm. Wife stated that as she was getting the urinal in the bathroom, pt got up from the bed and walked to the bathroom loss his balance and fell on the floor.  Assessment done pt with no injuries. He denies pain or discomfort.  Pt cleaned up and assisted  back to bed.  Pt stable, neuro intact. Md notified. Pt and wife educated and instructed to notify staff for transferring. Pt urinal bedside. Safety measures in place.

## 2017-07-03 NOTE — Progress Notes (Signed)
Being taken out for further testing. Will follow up later to complete CIR consult.

## 2017-07-03 NOTE — Plan of Care (Signed)
  Education: Knowledge of disease or condition will improve 07/03/2017 0241 - Progressing by Olena Materobinson, Aisa Schoeppner G, RN Note POC reviewed with pt.

## 2017-07-04 DIAGNOSIS — I6312 Cerebral infarction due to embolism of basilar artery: Secondary | ICD-10-CM

## 2017-07-04 DIAGNOSIS — I5021 Acute systolic (congestive) heart failure: Secondary | ICD-10-CM

## 2017-07-04 LAB — CBC WITH DIFFERENTIAL/PLATELET
BASOS ABS: 0 10*3/uL (ref 0.0–0.1)
BASOS PCT: 0 %
Eosinophils Absolute: 0.2 10*3/uL (ref 0.0–0.7)
Eosinophils Relative: 2 %
HEMATOCRIT: 49.7 % (ref 39.0–52.0)
HEMOGLOBIN: 16.4 g/dL (ref 13.0–17.0)
Lymphocytes Relative: 50 %
Lymphs Abs: 5.7 10*3/uL — ABNORMAL HIGH (ref 0.7–4.0)
MCH: 30.5 pg (ref 26.0–34.0)
MCHC: 33 g/dL (ref 30.0–36.0)
MCV: 92.6 fL (ref 78.0–100.0)
Monocytes Absolute: 1.1 10*3/uL — ABNORMAL HIGH (ref 0.1–1.0)
Monocytes Relative: 10 %
NEUTROS ABS: 4.3 10*3/uL (ref 1.7–7.7)
NEUTROS PCT: 38 %
Platelets: 282 10*3/uL (ref 150–400)
RBC: 5.37 MIL/uL (ref 4.22–5.81)
RDW: 13.7 % (ref 11.5–15.5)
WBC: 11.4 10*3/uL — ABNORMAL HIGH (ref 4.0–10.5)

## 2017-07-04 LAB — GLUCOSE, CAPILLARY: Glucose-Capillary: 113 mg/dL — ABNORMAL HIGH (ref 65–99)

## 2017-07-04 LAB — BASIC METABOLIC PANEL
ANION GAP: 11 (ref 5–15)
BUN: 9 mg/dL (ref 6–20)
CALCIUM: 9.4 mg/dL (ref 8.9–10.3)
CO2: 23 mmol/L (ref 22–32)
Chloride: 102 mmol/L (ref 101–111)
Creatinine, Ser: 1.01 mg/dL (ref 0.61–1.24)
Glucose, Bld: 113 mg/dL — ABNORMAL HIGH (ref 65–99)
Potassium: 4.2 mmol/L (ref 3.5–5.1)
Sodium: 136 mmol/L (ref 135–145)

## 2017-07-04 NOTE — Progress Notes (Signed)
Physical Therapy Treatment Patient Details Name: Angel CooperJames D Olesen MRN: [id number] DOB: 12/24/1970 Today's Date: 07/04/2017    History of Present Illness 46 y.o. male with medical history significant for ADHD, obesity, anxiety with depression. Presenting with L-sided weakness/numbness, left facial droop, and slurred speech. MRI showing 2.3 x 1.2 cm reduced diffusion RIGHT pons and faint reduced diffusion RIGHT temporal occipital lobe.    PT Comments    Pt emotional today stating "I don't want to be a burden" but is motivated to walk again and participate in PT. Worked on L LE strengthening and gait training today. Pt with improved ambulation distance and ability to control L LE despite drop foot and L knee instability. Pt remains appropriate for CIR upon d/c as pt is 46yo and was indep and working PTA. Acute PT to con't to follow.   Follow Up Recommendations  CIR     Equipment Recommendations  None recommended by PT    Recommendations for Other Services       Precautions / Restrictions Precautions Precautions: Fall Restrictions Weight Bearing Restrictions: No    Mobility  Bed Mobility Overal bed mobility: Needs Assistance Bed Mobility: Rolling;Sidelying to Sit Rolling: Min guard Sidelying to sit: Min assist       General bed mobility comments: directional v/c's, for sequencing and attempt to use L UE  Transfers Overall transfer level: Needs assistance Equipment used: None Transfers: Sit to/from Stand Sit to Stand: Min assist         General transfer comment: pt placed L hand on knee to push up, pt with lean to the L requiring minA to maintain balance, L knee instability  Ambulation/Gait Ambulation/Gait assistance: Min assist Ambulation Distance (Feet): 30 Feet Assistive device: 1 person hand held assist(2nd person for chair follow) Gait Pattern/deviations: Decreased stride length;Step-through pattern;Decreased weight shift to left;Decreased dorsiflexion - left Gait  velocity: slow Gait velocity interpretation: Below normal speed for age/gender General Gait Details: pt with L LE weakness/instability causing buckling when in stance phase requiring modA to prevent. when pt focuses on terminal knee extension pt able to maintain 50% of time. v/c's for sequencing steps and modA for assisting with weight shifting   Stairs            Wheelchair Mobility    Modified Rankin (Stroke Patients Only) Modified Rankin (Stroke Patients Only) Pre-Morbid Rankin Score: Moderately severe disability     Balance Overall balance assessment: Needs assistance Sitting-balance support: No upper extremity supported;Feet supported Sitting balance-Leahy Scale: Fair Sitting balance - Comments: pt with posterior lean when performing LE seated ther ex   Standing balance support: Single extremity supported Standing balance-Leahy Scale: Poor Standing balance comment: pt with L LE instabilit                            Cognition Arousal/Alertness: Awake/alert Behavior During Therapy: WFL for tasks assessed/performed Overall Cognitive Status: Within Functional Limits for tasks assessed Area of Impairment: Problem solving                             Problem Solving: Requires verbal cues;Difficulty sequencing;Requires tactile cues General Comments: pt emotional today stating "I don't want to be a burden"      Exercises General Exercises - Lower Extremity Quad Sets: AROM;Left;10 reps;Supine Long Arc Quad: AROM;Left;10 reps;Seated(worked on eccentric control )    General Comments        Pertinent  Vitals/Pain Pain Assessment: No/denies pain Pain Intervention(s): Monitored during session    Home Living                      Prior Function            PT Goals (current goals can now be found in the care plan section) Acute Rehab PT Goals Patient Stated Goal: return to walking Progress towards PT goals: Progressing toward goals     Frequency    Min 4X/week      PT Plan Current plan remains appropriate    Co-evaluation              AM-PAC PT "6 Clicks" Daily Activity  Outcome Measure  Difficulty turning over in bed (including adjusting bedclothes, sheets and blankets)?: Unable Difficulty moving from lying on back to sitting on the side of the bed? : Unable Difficulty sitting down on and standing up from a chair with arms (e.g., wheelchair, bedside commode, etc,.)?: Unable Help needed moving to and from a bed to chair (including a wheelchair)?: A Little Help needed walking in hospital room?: A Little Help needed climbing 3-5 steps with a railing? : A Lot 6 Click Score: 11    End of Session Equipment Utilized During Treatment: Gait belt Activity Tolerance: Patient tolerated treatment well Patient left: in chair;with family/visitor present;with call bell/phone within reach   PT Visit Diagnosis: Hemiplegia and hemiparesis;Other abnormalities of gait and mobility (R26.89);Difficulty in walking, not elsewhere classified (R26.2) Hemiplegia - Right/Left: Left Hemiplegia - dominant/non-dominant: Dominant Hemiplegia - caused by: Unspecified     Time: [id number] PT Time Calculation (min) (ACUTE ONLY): 24 min  Charges:  $Gait Training: 8-22 mins $Neuromuscular Re-education: 8-22 mins                    G Codes:       Lewis ShockAshly Dianca Owensby, PT, DPT Pager #: [phone] Office #: [phone]    Jerrine Urschel M Kiyara Bouffard 07/04/2017, 1:49 PM

## 2017-07-04 NOTE — Progress Notes (Signed)
PROGRESS NOTE  Angel CooperJames D Pagliarulo ZOX:[id number] DOB: 02/06/1971 DOA: 07/02/2017 PCP: Patient, No Pcp Per  HPI/Recap of past 24 hours: HPI from [name], Allison NP on 07/02/17  Angel Davies is a 46 y.o. male with medical history significant for ADHD, obesity, anxiety with depression.  Patient presented via private vehicle with strokelike symptoms.  Reportedly went to bed, woke up, was unable to bear weight on the left leg and at that time noticed not only left leg weakness but left-sided numbness, slurred speech and a left facial droop. Patient didn't seek any medical attention till later in the afternoon on 07/02/17. Patient admits to drinking half a box of wine and using cocaine the night before presentation. Pt also uses marijuana 4 times a week  Of note, pt had been seen by neurology Dr. Lucia GaskinsAhern on 11/19 with a chief complaint of history of strokes in the family, hx of fall, last in June, which was preceded by vision loss in the left eye peripherally and was associated with headaches. An outpatient MRI of the brain with and without contrast had been planned for 07/06/17. In the ED, CT of the head revealed small bilateral occipital infarcts. Neurology was consulted.  A code stroke was called but pt was not a candidate for thrombolytic therapy  Today, pt reported feeling "ok". Denies any new complaints, no chest pain, SOB, abdominal pain, N/V/D/C, fever/chills. Pt still noted with LUE & LLE weakness, facial droop with improved speech. Wife at bedside.   Assessment/Plan: Principal Problem:   CVA (cerebral vascular accident) (HCC) Active Problems:   Alcohol use   Anxiety   Depression   Acute hyperglycemia   Elevated blood-pressure reading without diagnosis of hypertension   ADHD   Cocaine abuse (HCC)   Myocardiopathy (HCC)   Mixed hyperlipidemia  # Acute right pontine infarct Pt with LUE/LLE weakness, facial droop Strong positive FH of strokes, cocaine abuse, HLD, HTN -CT head demonstrates  chronic bilateral occipital infarcts -MRI head: Acute 2.3 x 1.2 cm nonhemorrhagic RIGHT pontine infarct. Subacute small RIGHT   temporal occipital lobe/PCA territory infarct. Old LEFT occipital lobe/PCA territory infarct.  -CT angio head/neck: Prominent noncalcified atherosclerotic plaque in the left subclavian artery with    up to 40% stenosis. No superimposed ulceration. Intracranial atherosclerosis along the bilateral    carotid siphons with mild left paraclinoid ICA narrowing. Known high-grade right P2 segment    Stenosis -Echocardiogram: LVEF 20-25%, Grade II DD, +RWMA  -Telemetry, EKG SR -Lipid panel: TC 303, TG 539, LDL unable to calculate -A1c: 6, pre-diabetic -ASA 325mg , lipitor 80mg , fenofibrate 160mg  daily -Frequent neuro checks -Neurology consulted, apprec recs -Consulted cardiology for cardiomyopathy -PT/OT/SLP evaluation, recommend CIR, consult placed  #HLD -Lipid panel: TC 303, TG 539, LDL unable to calculate -Lipitor + fenofibrate  #Newly diagnosed CHF -Appears euvolemic, ??ischemic -LVEF 20-25%, Grade II DD, +RWMA - Cards on board awaiting evaluation and recommendations.  #Pre-diabetes -A1c 6 -Follow up as outpt, lifestyle modification advised  #??hypertension -Continue to monitor trends -Given acute stroke symptoms will allow for permissive hypertension -IV hydralazine prn SBP>/= 220 or DBP>/= 110  #Polysubstance abuse +Cocaine, marijuana, tobacco abuse -UDS, + for cocaine, THC -Counseled regarding absolute cessation of both substances -Nicotine patch provided  #Alcohol use -Patient denies daily use of alcohol -Admitted to drinking one half of a box of wine on 07/02/17 -CIWA prn -FA, MVT, Thiamine  #Anxiety/Depression -Continue home Klonopin and trazodone  #ADHD -Continue home Adderall  Code Status: Full  Family Communication: Spoke  with wife  Disposition Plan: CIR    Consultants:  Neurology  Procedures:  None    Antimicrobials:  None  DVT prophylaxis:  Lovenox   Objective: Vitals:   07/04/17 0108 07/04/17 0505 07/04/17 0800 07/04/17 1420  BP: 116/79 127/86 106/72 110/68  Pulse: (!) 106 94 73 76  Resp: 18 18 18 18   Temp: 97.6 F (36.4 C) 97.9 F (36.6 C) 98.2 F (36.8 C) 98.3 F (36.8 C)  TempSrc: Oral Oral Oral Oral  SpO2: 98% 97% 97% 98%  Weight:      Height:        Intake/Output Summary (Last 24 hours) at 07/04/2017 1827 Last data filed at 07/04/2017 0100 Gross per 24 hour  Intake -  Output 400 ml  Net -400 ml   Filed Weights   07/02/17 1455  Weight: 87.5 kg (193 lb)    Exam:   General:  AAO X 3, facial droop, improved speech  Cardiovascular: S1-S2 present, no added heart sound  Respiratory: Chest clear B/L, equal chest rise  Abdomen: Soft, non-tender, non-distended, BS present  Musculoskeletal: No pedal edema bilaterally   Skin: Normal  Psychiatry: Depressed mood  Neuro: LUE & LLE 4/5, RUE/RLE 5/5, +dysmetria with finger to nose on left   Data Reviewed: CBC: Recent Labs  Lab 07/02/17 1440 07/02/17 1448 07/03/17 0759 07/04/17 0250  WBC 13.6*  --  12.6* 11.4*  NEUTROABS 9.3*  --  6.0 4.3  HGB 16.4 17.3* 16.4 16.4  HCT 48.4 51.0 48.7 49.7  MCV 91.5  --  91.4 92.6  PLT 310  --  281 282   Basic Metabolic Panel: Recent Labs  Lab 07/02/17 1440 07/02/17 1448 07/03/17 0759 07/04/17 0250  NA 137 140 137 136  K 4.6 4.4 3.8 4.2  CL 106 106 105 102  CO2 19*  --  23 23  GLUCOSE 140* 153* 143* 113*  BUN 12 13 7 9   CREATININE 1.14 1.00 0.85 1.01  CALCIUM 9.6  --  9.4 9.4   GFR: Estimated Creatinine Clearance: 97.9 mL/min (by C-G formula based on SCr of 1.01 mg/dL). Liver Function Tests: Recent Labs  Lab 07/02/17 1440  AST 32  ALT 34  ALKPHOS 83  BILITOT 0.6  PROT 7.9  ALBUMIN 4.4   No results for input(s): LIPASE, AMYLASE in the last 168 hours. No results for input(s): AMMONIA in the last 168 hours. Coagulation Profile: Recent Labs   Lab 07/02/17 1440  INR 0.96   Cardiac Enzymes: No results for input(s): CKTOTAL, CKMB, CKMBINDEX, TROPONINI in the last 168 hours. BNP (last 3 results) No results for input(s): PROBNP in the last 8760 hours. HbA1C: Recent Labs    07/03/17 0233  HGBA1C 6.0*   CBG: Recent Labs  Lab 07/02/17 1440  GLUCAP 147*   Lipid Profile: Recent Labs    07/03/17 0233  CHOL 303*  HDL 31*  LDLCALC UNABLE TO CALCULATE IF TRIGLYCERIDE OVER 400 mg/dL  TRIG 782*  CHOLHDL 9.8   Thyroid Function Tests: No results for input(s): TSH, T4TOTAL, FREET4, T3FREE, THYROIDAB in the last 72 hours. Anemia Panel: No results for input(s): VITAMINB12, FOLATE, FERRITIN, TIBC, IRON, RETICCTPCT in the last 72 hours. Urine analysis:    Component Value Date/Time   COLORURINE YELLOW 07/02/2017 1758   APPEARANCEUR CLEAR 07/02/2017 1758   LABSPEC 1.016 07/02/2017 1758   PHURINE 5.0 07/02/2017 1758   GLUCOSEU NEGATIVE 07/02/2017 1758   HGBUR NEGATIVE 07/02/2017 1758   BILIRUBINUR NEGATIVE 07/02/2017 1758   KETONESUR  NEGATIVE 07/02/2017 1758   PROTEINUR NEGATIVE 07/02/2017 1758   NITRITE NEGATIVE 07/02/2017 1758   LEUKOCYTESUR NEGATIVE 07/02/2017 1758   Sepsis Labs: @LABRCNTIP (procalcitonin:4,lacticidven:4)  )No results found for this or any previous visit (from the past 240 hour(s)).    Studies: No results found.  Scheduled Meds: .  stroke: mapping our early stages of recovery book   Does not apply Once  . aspirin  300 mg Rectal Daily   Or  . aspirin  325 mg Oral Daily  . atorvastatin  80 mg Oral q1800  . enoxaparin (LOVENOX) injection  40 mg Subcutaneous Q24H  . fenofibrate  160 mg Oral Daily  . folic acid  1 mg Oral Daily  . multivitamin with minerals  1 tablet Oral Daily  . nicotine  14 mg Transdermal Daily  . thiamine  100 mg Oral Daily    Continuous Infusions:   LOS: 2 days     Penny PiaVEGA, Marne Meline, MD [facility]   If 7PM-7AM, please contact  night-coverage [email or web address] Password [email or web address] 07/04/2017, 6:27 PM

## 2017-07-04 NOTE — Consult Note (Signed)
Advanced Heart Failure Team Consult Note   Primary Physician: None Primary Cardiologist:  New (Dr. Gala RomneyBensimhon)  Reason for Consultation: New systolic CHF  HPI:    Claretha CooperJames D Langill is seen today for evaluation of New systolic HF at the request of Dr. Cena BentonVega.   Claretha CooperJames D Angelini is a 46 y.o. male with PMH of ADHD, obesity, anxiety, and depression.   Pt admitted 07/02/17 with stroke like symptoms with left sided weakness, facial droop, and visual changes. Confirmed to have R pontine infarct and sub acute R temporal occipital/PCA territory infarct, along with old left occipital lobe/PCA territory infarct.. Pertinent labs on admission include, Triglyceride 539 (therfore unable to estimate LDL), A1c 6.0, Creatinine 1.611.14, K 4.6, negative troponin, Hgb 16.4  Pt had previously seen an outpatient neurologist 06/18/17 for complete stroke work up after headaches and left/lower quadrant vision loss were recurrent after a fall where he hit his head. CTA was planned for 07/06/17.  Pt was at his "[facility]" up until Monday morning, when his wife noticed his slurred speech and he was acting strange. She initially went to work but came back to check on him and made him seek care.   Pt states PTA he was working his job at [employer] without any difficult. He denies any DOE, lightheadedness, dizziness, or CP.  His mother had CAD and CHF, and his sister has "very high cholesterol". He did not know his biological father. Pt does not follow up with PCP regularly.  He last used cocaine 07/01/17. He states he "didn't have a problem with using" but states he used it at least once a week. He has 15 pack years of smoking with 1 ppd habit up to admission. Drinks 5-6 ETOH beverages weekly. Denies any history of IV drug use.   Echo 07/03/17 with LVEF 20-25%, grade 2 DD, Trivial MR, Trivial TR. Akinesis of the anterior and apical myocardium.  MRI Brain/Head 07/02/17 1. 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 2.  Subacute small RIGHT temporal occipital lobe/PCA territory infarct. Old LEFT occipital lobe/PCA territory infarct. MRA HEAD:  1. No emergent large vessel occlusion. 2. Severe stenosis distal RIGHT P2 segment and, poor flow related enhancement distal posterior cerebral artery's. Given posterior circulation territory infarcts of varying ages, recommend CTA HEAD and neck to exclude vertebral artery injury and further assess posterior cerebral artery's.  CTA Head/Neck 07/03/17 1. No emergent vascular finding. 2. Prominent noncalcified atherosclerotic plaque in the left subclavian artery with up to 40% stenosis. No superimposed ulceration. 3. Intracranial atherosclerosis along the bilateral carotid siphons with mild left paraclinoid ICA narrowing. Known high-grade right P2 segment stenosis. 4.  Emphysema (ICD10-J43.9).  Review of Systems: [y] = yes, [ ]  = no   General: Weight gain [ ] ; Weight loss [ ] ; Anorexia [ ] ; Fatigue [y]; Fever [ ] ; Chills [ ] ; Weakness [ ]   Cardiac: Chest pain/pressure [ ] ; Resting SOB [ ] ; Exertional SOB [ ] ; Orthopnea [ ] ; Pedal Edema [ ] ; Palpitations [ ] ; Syncope [ ] ; Presyncope [ ] ; Paroxysmal nocturnal dyspnea[ ]   Pulmonary: Cough [y]; Wheezing[ ] ; Hemoptysis[ ] ; Sputum [ ] ; Snoring [ ]   GI: Vomiting[ ] ; Dysphagia[ ] ; Melena[ ] ; Hematochezia [ ] ; Heartburn[ ] ; Abdominal pain [ ] ; Constipation [ ] ; Diarrhea [ ] ; BRBPR [ ]   GU: Hematuria[ ] ; Dysuria [ ] ; Nocturia[ ]   Vascular: Pain in legs with walking [ ] ; Pain in feet with lying flat [ ] ; Non-healing sores [ ] ; Stroke [ ] ;  TIA [ ] ; Slurred speech [ ] ;  Neuro: Headaches[ ] ; Vertigo[ ] ; Seizures[ ] ; Paresthesias[ ] ;Blurred vision [y]; Diplopia [ ] ; Vision changes [y]  Ortho/Skin: Arthritis [y]; Joint pain [ ] ; Muscle pain [ ] ; Joint swelling [ ] ; Back Pain [ ] ; Rash [ ]  Psych: Depression[ ] ; Anxiety[ ]   Heme: Bleeding problems [ ] ; Clotting disorders [ ] ; Anemia [ ]   Endocrine: Diabetes [ ] ; Thyroid  dysfunction[ ]   Home Medications Prior to Admission medications   Medication Sig Start Date End Date Taking? Authorizing Provider  acetaminophen (TYLENOL) 500 MG tablet Take 1,000 mg by mouth every 6 (six) hours as needed for headache (pain).   Yes [provider]  amphetamine-dextroamphetamine (ADDERALL) 20 MG tablet Take 20 mg by mouth 4 (four) times daily.    Yes [provider]  clonazePAM (KLONOPIN) 1 MG tablet Take 1 mg by mouth 3 (three) times daily as needed (jitters from adderall).    Yes [provider]  traZODone (DESYREL) 50 MG tablet Take 100-150 mg by mouth at bedtime as needed for sleep.   Yes [provider]   Past Medical History: Past Medical History:  Diagnosis Date  . ADHD (attention deficit hyperactivity disorder)   . Anxiety   . CVA (cerebral vascular accident) (HCC) 07/02/2017   "left sided weakness" (07/02/2017)  . Depression   . History of kidney stones    Past Surgical History: Past Surgical History:  Procedure Laterality Date  . CYSTOSCOPY WITH URETEROSCOPY, STONE BASKETRY AND STENT PLACEMENT     Family History: Family History  Problem Relation Age of Onset  . Stroke Mother   . Colon cancer Mother   . Prostate cancer Father   . Seizures Neg Hx    Social History: Social History   Socioeconomic History  . Marital status: Married    Spouse name: None  . Number of children: None  . Years of education: None  . Highest education level: None  Social Needs  . Financial resource strain: None  . Food insecurity - worry: None  . Food insecurity - inability: None  . Transportation needs - medical: None  . Transportation needs - non-medical: None  Occupational History  . None  Tobacco Use  . Smoking status: Current Every Day Smoker    Packs/day: 1.00    Years: 30.00    Pack years: 30.00    Types: Cigarettes  . Smokeless tobacco: Never Used  Substance and Sexual Activity  . Alcohol use: Yes    Alcohol/week: 2.4  oz    Types: 4 Glasses of wine per week  . Drug use: Yes    Types: Marijuana, "Crack" cocaine    Comment: 07/02/2017 "smokes marijuana a couple days/week"  . Sexual activity: Yes  Other Topics Concern  . None  Social History Narrative  . None   Allergies:  No Known Allergies  Objective:    Vital Signs:   Temp:  [97.6 F (36.4 C)-98.4 F (36.9 C)] 98.2 F (36.8 C) (12/05 0800) Pulse Rate:  [73-106] 73 (12/05 0800) Resp:  [18-20] 18 (12/05 0800) BP: (106-154)/(68-86) 106/72 (12/05 0800) SpO2:  [95 %-98 %] 97 % (12/05 0800) Last BM Date: 07/02/17  Weight change: Filed Weights   07/02/17 1455  Weight: 193 lb (87.5 kg)   Intake/Output:   Intake/Output Summary (Last 24 hours) at 07/04/2017 1400 Last data filed at 07/04/2017 0100 Gross per 24 hour  Intake -  Output 400 ml  Net -400 ml  Physical Exam    General:  Fatigued. NAD. Speech slightly slurred HEENT: normal Neck: supple. JVP does not appear elevated. Carotids 2+ bilat; no bruits. No lymphadenopathy or thyromegaly appreciated. Cor: PMI nondisplaced. Regular rate & rhythm. No rubs, gallops or murmurs. Lungs: clear Abdomen: soft, nontender, nondistended. No hepatosplenomegaly. No bruits or masses. Good bowel sounds. Extremities: no cyanosis, clubbing, rash, edema Neuro: alert & orientedx3, cranial nerves grossly intact. Moves R side without difficult. Left sided weakness ULE/LLE. Mild L facial droop.   Telemetry   NSR, personally reviewed  EKG    Sinus Tach 108 bpm, personally reviewed  Labs   Basic Metabolic Panel: Recent Labs  Lab 07/02/17 1440 07/02/17 1448 07/03/17 0759 07/04/17 0250  NA 137 140 137 136  K 4.6 4.4 3.8 4.2  CL 106 106 105 102  CO2 19*  --  23 23  GLUCOSE 140* 153* 143* 113*  BUN 12 13 7 9   CREATININE 1.14 1.00 0.85 1.01  CALCIUM 9.6  --  9.4 9.4   Liver Function Tests: Recent Labs  Lab 07/02/17 1440  AST 32  ALT 34  ALKPHOS 83  BILITOT 0.6  PROT 7.9  ALBUMIN 4.4    No results for input(s): LIPASE, AMYLASE in the last 168 hours. No results for input(s): AMMONIA in the last 168 hours.  CBC: Recent Labs  Lab 07/02/17 1440 07/02/17 1448 07/03/17 0759 07/04/17 0250  WBC 13.6*  --  12.6* 11.4*  NEUTROABS 9.3*  --  6.0 4.3  HGB 16.4 17.3* 16.4 16.4  HCT 48.4 51.0 48.7 49.7  MCV 91.5  --  91.4 92.6  PLT 310  --  281 282   Cardiac Enzymes: No results for input(s): CKTOTAL, CKMB, CKMBINDEX, TROPONINI in the last 168 hours.  BNP: BNP (last 3 results) No results for input(s): BNP in the last 8760 hours.  ProBNP (last 3 results) No results for input(s): PROBNP in the last 8760 hours.  CBG: Recent Labs  Lab 07/02/17 1440  GLUCAP 147*   Coagulation Studies: Recent Labs    07/02/17 1440  LABPROT 12.7  INR 0.96   Imaging   Ct Angio Head W Or Wo Contrast  Result Date: 07/03/2017 CLINICAL DATA:  Stroke follow-up. EXAM: CT ANGIOGRAPHY HEAD AND NECK TECHNIQUE: Multidetector CT imaging of the head and neck was performed using the standard protocol during bolus administration of intravenous contrast. Multiplanar CT image reconstructions and MIPs were obtained to evaluate the vascular anatomy. Carotid stenosis measurements (when applicable) are obtained utilizing NASCET criteria, using the distal internal carotid diameter as the denominator. CONTRAST:  <See Chart> ISOVUE-370 IOPAMIDOL (ISOVUE-370) INJECTION 76%50 cc Isovue 370 intravenous COMPARISON:  Brain MRI and intracranial MRA from yesterday FINDINGS: CTA NECK FINDINGS Aortic arch: 3 vessel branching. Noncalcified atheromatous wall thickening. No acute finding or dilatation. Right carotid system: Questionable atheromatous wall thickening of the common carotid artery and ICA bulb. No ulceration, dissection, or beading. Left carotid system: No stenosis, ulceration, or beading. No definitive atherosclerosis. Vertebral arteries: Proximal subclavian noncalcified atherosclerosis with up to 40% stenosis.  Right dominant vertebral artery. Limited visualization of distal left V1 segment due to neighboring streak artifact from intravenous contrast. No dissection or beading is visualized. Skeleton: No acute or aggressive finding Other neck: No noted mass or inflammation. Upper chest: Centrilobular emphysema. Review of the MIP images confirms the above findings CTA HEAD FINDINGS Anterior circulation: Atheromatous plaque on the carotid siphons that is mild. Mild narrowing at the left paraclinoid ICA segment. No branch  occlusion or flow limiting stenosis. Negative for aneurysm; bulbous appearance of the left MCA bifurcation on thick MIPS is related to trifurcation. Reference 3D reformats. Posterior circulation: Dominant right vertebral artery. Most of left vertebral flow is into the left PICA. Dominant rightAICA. No branch occlusion. High-grade right P2 segment stenosis. Negative for aneurysm. Venous sinuses: Patent Anatomic variants: None significant Delayed phase: No abnormal intracranial enhancement. Known acute right pontine infarct. Known bilateral occipital infarcts. Review of the MIP images confirms the above findings IMPRESSION: 1. No emergent vascular finding. 2. Prominent noncalcified atherosclerotic plaque in the left subclavian artery with up to 40% stenosis. No superimposed ulceration. 3. Intracranial atherosclerosis along the bilateral carotid siphons with mild left paraclinoid ICA narrowing. Known high-grade right P2 segment stenosis. 4.  Emphysema (ICD10-J43.9). Electronically Signed   By: Marnee Spring M.D.   On: 07/03/2017 15:03   Ct Angio Neck W Or Wo Contrast  Result Date: 07/03/2017 CLINICAL DATA:  Stroke follow-up. EXAM: CT ANGIOGRAPHY HEAD AND NECK TECHNIQUE: Multidetector CT imaging of the head and neck was performed using the standard protocol during bolus administration of intravenous contrast. Multiplanar CT image reconstructions and MIPs were obtained to evaluate the vascular anatomy.  Carotid stenosis measurements (when applicable) are obtained utilizing NASCET criteria, using the distal internal carotid diameter as the denominator. CONTRAST:  <See Chart> ISOVUE-370 IOPAMIDOL (ISOVUE-370) INJECTION 76%50 cc Isovue 370 intravenous COMPARISON:  Brain MRI and intracranial MRA from yesterday FINDINGS: CTA NECK FINDINGS Aortic arch: 3 vessel branching. Noncalcified atheromatous wall thickening. No acute finding or dilatation. Right carotid system: Questionable atheromatous wall thickening of the common carotid artery and ICA bulb. No ulceration, dissection, or beading. Left carotid system: No stenosis, ulceration, or beading. No definitive atherosclerosis. Vertebral arteries: Proximal subclavian noncalcified atherosclerosis with up to 40% stenosis. Right dominant vertebral artery. Limited visualization of distal left V1 segment due to neighboring streak artifact from intravenous contrast. No dissection or beading is visualized. Skeleton: No acute or aggressive finding Other neck: No noted mass or inflammation. Upper chest: Centrilobular emphysema. Review of the MIP images confirms the above findings CTA HEAD FINDINGS Anterior circulation: Atheromatous plaque on the carotid siphons that is mild. Mild narrowing at the left paraclinoid ICA segment. No branch occlusion or flow limiting stenosis. Negative for aneurysm; bulbous appearance of the left MCA bifurcation on thick MIPS is related to trifurcation. Reference 3D reformats. Posterior circulation: Dominant right vertebral artery. Most of left vertebral flow is into the left PICA. Dominant rightAICA. No branch occlusion. High-grade right P2 segment stenosis. Negative for aneurysm. Venous sinuses: Patent Anatomic variants: None significant Delayed phase: No abnormal intracranial enhancement. Known acute right pontine infarct. Known bilateral occipital infarcts. Review of the MIP images confirms the above findings IMPRESSION: 1. No emergent vascular  finding. 2. Prominent noncalcified atherosclerotic plaque in the left subclavian artery with up to 40% stenosis. No superimposed ulceration. 3. Intracranial atherosclerosis along the bilateral carotid siphons with mild left paraclinoid ICA narrowing. Known high-grade right P2 segment stenosis. 4.  Emphysema (ICD10-J43.9). Electronically Signed   By: Marnee Spring M.D.   On: 07/03/2017 15:03     Medications:    Current Medications: .  stroke: mapping our early stages of recovery book   Does not apply Once  . aspirin  300 mg Rectal Daily   Or  . aspirin  325 mg Oral Daily  . atorvastatin  80 mg Oral q1800  . enoxaparin (LOVENOX) injection  40 mg Subcutaneous Q24H  . fenofibrate  160 mg Oral  Daily  . folic acid  1 mg Oral Daily  . multivitamin with minerals  1 tablet Oral Daily  . nicotine  14 mg Transdermal Daily  . thiamine  100 mg Oral Daily     Infusions:   Patient Profile   TAEGAN STANDAGE is a 46 y.o. male with PMH of HTN, ADHD, obesity, anxiety, and depression.   Presented 07/02/17 with CVA. Found to have systolic CHF by echo.  UDS notably + for cocaine.  Assessment/Plan   1. 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 - With EF < 30%, will need anticoagulation moving forward. Suspect cardioembolic in etiology.  2. Acute systolic CHF - Echo 07/02/17 with LVEF 20-25%, grade 2 DD, Trivial MR, Trivial TR - Unclear etiology, suspect CAD based on Echo with WMAs and apex hypokinesis. ? Component from substance abuse as well. - BP stable in 110s. Will need to be gentle with HF med titration in setting of stroke.  - Will need L/RHC once recovered for etiology of CHF - Will need repeat echo 3 months after med optimization.   3. HTN - Will adjust meds in setting of treating his HF. Will not be overly aggressive with recent CVA.   4. Cocaine abuse - Stressed importance of complete  cessation.   5. Alcohol abuse - Stressed importance of complete cessation.  6. HLD - Started on lipitor and fenofibrate.  7. Hypertriglyceridemia - Now on Lipitor and Fenofibrate  Medication concerns reviewed with patient and pharmacy team. Barriers identified: Compliance may be an issue, but patient seems sincere to try and recover at this time.   Length of Stay: 2  Graciella Freer, PA-C  07/04/2017, 2:00 PM  Advanced Heart Failure Team Pager [phone] (M-F; 7a - 4p)  Please contact [facility] for night-coverage after hours (4p -7a ) and weekends on [email or web address]  Patient seen and examined with the above-signed Advanced Practice Provider and/or Housestaff. I personally reviewed laboratory data, imaging studies and relevant notes. I independently examined the patient and formulated the important aspects of the plan. I have edited the note to reflect any of my changes or salient points. I have personally discussed the plan with the patient and/or family.  46 y/o male with polysubstance abuse, HTN  and very strong FHx of CAD admitted with acute left-sided CVA. Found to have EF 20% with regional wall motion abnormalities and normal RV (echo reviewed personally). Suspect CVA almost certainly cardio-embolic in setting of low EF but he denies any antecedent symptoms of HF. Currently volume status looks ok on exam. Typically I would wait several weeks for cath after acute CVA but given high suspicion for underlying ischemic heart disease will proceed with L/R heart cath tomorrow. Will need anticoagulation with heparin/warfarin after cath. Continue statin. Will let BP ride for a day or two after CVA prior to starting HF meds.   Arvilla Meres, MD  8:25 PM

## 2017-07-04 NOTE — Progress Notes (Signed)
Nutrition Education Note  RD consulted for nutrition education regarding a Heart Healthy diet.  46 y.o. male with PMH Polysubstance Abuse, Anxiety and Depression presenting with slurred speech, Left arm/leg weakness. MRI: 1. 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 2. Subacute small RIGHT temporal occipital lobe/PCA territory infarct. Old LEFT occipital lobe/PCA territory infarct.     Lipid Panel     Component Value Date/Time   CHOL 303 (H) 07/03/2017 0233   TRIG 539 (H) 07/03/2017 0233   HDL 31 (L) 07/03/2017 0233   CHOLHDL 9.8 07/03/2017 0233   VLDL UNABLE TO CALCULATE IF TRIGLYCERIDE OVER 400 mg/dL 16/10/960412/10/2016 [id number]   LDLCALC UNABLE TO CALCULATE IF TRIGLYCERIDE OVER 400 mg/dL 81/19/147812/10/2016 [id number]    RD provided "Heart Healthy Nutrition Therapy" handout from the Academy of [employer]. Reviewed patient's dietary recall. Provided examples on ways to decrease sodium and fat intake in diet. Discouraged intake of processed foods and use of salt shaker. Encouraged fresh fruits and vegetables as well as whole grain sources of carbohydrates to maximize fiber intake. Teach back method used.  Expect fair to poor compliance.  Body mass index is 28.5 kg/m. Pt meets criteria for overweight based on current BMI. Per chart, pt with weight gain.   Current diet order is heart healthy, patient is consuming approximately 100% of meals at this time. Labs and medications reviewed. Pt on CIWA protocol and declines all supplements.   No further nutrition interventions warranted at this time. RD contact information provided. If additional nutrition issues arise, please re-consult RD.  Betsey Holidayasey Keauna Brasel MS, RD, LDN Pager #- [phone] After Hours Pager: [phone]

## 2017-07-04 NOTE — Progress Notes (Signed)
I met with pt and his wife to begin discussions for goals and expectations of an inpt rehab admit. They are in agreement. I will begin insurance approval with [employer]. Admission pending insurance approval. [phone]

## 2017-07-04 NOTE — H&P (Signed)
Physical Medicine and Rehabilitation Admission H&P    Chief Complaint  Patient presents with  . Functional deficits due to stroke with left sided weakness and slurred speech.     HPI: Angel Davies is a 46 y.o. male with history of ADHD, anxiety, depression/anxiety, fall 12/2016 with loss of peripheral vision and onset of HA, polysubtance abuse who was admitted on 07/02/17 with fall, slurred speech and left sided weakness. Patient reported that he was drinking ETOH and used cocaine the night before--UDS positive for cocaine and THC. CT head negative. MRI/MRA brain done revealing " Acute RIGHT pontine infarct, Subacute small RIGHT temporal occipital lobe/PCA territory infarct and severe stenosis distal rigth P2 segment.  CTA head/neck was negative for emergent vascular finding, 40% left SA stenosis due to plaque and emphysema.  2D echo showed EF 20-25% with moderately dilated LV, akinesis of anterior and apical myocardium with grade 2 diastolic dysfunction.   He was evaluated by Dr. Haroldine Laws and underwent cardiac cath on 12/6 revealing severe CAD with occluded mid LAD and well compensated filling pressures with moderately reduced cardiac output. Cardiac MRI ordered today for work up of decrease in EF out of proportion to CAD. To be treated medically with repeat echo in 3 months.   He continues on heparin/coumadin bridge and     Review of Systems  Constitutional: Negative for chills and fever.  HENT: Negative for hearing loss and tinnitus.   Eyes: Negative for blurred vision and double vision.  Cardiovascular: Negative for chest pain and palpitations.  Gastrointestinal: Positive for constipation. Negative for heartburn and nausea.  Genitourinary: Negative for dysuria and urgency.  Musculoskeletal: Negative for back pain, myalgias and neck pain.  Skin: Negative for itching.  Neurological: Positive for dizziness, sensory change, speech change, focal weakness and weakness. Negative for  headaches.  Psychiatric/Behavioral: Positive for substance abuse. The patient does not have insomnia.       Past Medical History:  Diagnosis Date  . ADHD (attention deficit hyperactivity disorder)   . Anxiety   . CVA (cerebral vascular accident) ([place]) 07/02/2017   "left sided weakness" (07/02/2017)  . Depression   . History of kidney stones     Past Surgical History:  Procedure Laterality Date  . CYSTOSCOPY WITH URETEROSCOPY, STONE BASKETRY AND STENT PLACEMENT    . RIGHT/LEFT HEART CATH AND CORONARY ANGIOGRAPHY N/A 07/05/2017   Procedure: RIGHT/LEFT HEART CATH AND CORONARY ANGIOGRAPHY;  Surgeon: Jolaine Artist, MD;  Location: [facility];  Service: Cardiovascular;  Laterality: N/A;    Family History  Problem Relation Age of Onset  . Stroke Mother   . Colon cancer Mother   . Prostate cancer Father   . Seizures Neg Hx     Social History:   Married. Independent and  reports that he has been smoking cigarettes--1 PPD.  He has a 30.00 pack-year smoking history.  He has never used smokeless tobacco. He reports that he drinks about 2 glasses of wine daily.  He denies use of illicit substances--did marijuana and cocaine once.    Allergies: No Known Allergies    Medications Prior to Admission  Medication Sig Dispense Refill  . acetaminophen (TYLENOL) 500 MG tablet Take 1,000 mg by mouth every 6 (six) hours as needed for headache (pain).    Marland Kitchen amphetamine-dextroamphetamine (ADDERALL) 20 MG tablet Take 20 mg by mouth 4 (four) times daily.     . clonazePAM (KLONOPIN) 1 MG tablet Take 1 mg by mouth 3 (three) times  daily as needed (jitters from adderall).     . traZODone (DESYREL) 50 MG tablet Take 100-150 mg by mouth at bedtime as needed for sleep.      Drug Regimen Review  Drug regimen was reviewed and remains appropriate with no significant issues identified  Home: Home Living Family/patient expects to be discharged to:: Private residence Living Arrangements:  Spouse/significant other Available Help at Discharge: Family, Available 24 hours/day Type of Home: House Home Access: Stairs to enter [employer] of Steps: 1 Home Layout: Two level Alternate Level Stairs-Number of Steps: flight Bathroom Shower/Tub: Tub/shower unit, Industrial/product designer: Yes Home Equipment: Tub bench Additional Comments: pt also assists with his Dad as caregiver  Lives With: Spouse   Functional History: Prior Function Level of Independence: Independent Comments: Warehouse work; usually desk work. driving  Functional Status:  Mobility: Bed Mobility Overal bed mobility: Needs Assistance Bed Mobility: Supine to Sit Rolling: Min guard Sidelying to sit: Min assist Supine to sit: Min guard General bed mobility comments: increased time and effort pulling up on rails. Transfers Overall transfer level: Needs assistance Equipment used: None Transfers: Sit to/from Stand Sit to Stand: Min assist General transfer comment: cues for technique.  Assist for safety and balance Ambulation/Gait Ambulation/Gait assistance: Mod assist Ambulation Distance (Feet): 40 Feet(x 3; and 50') Assistive device: 1 person hand held assist(and wall rail at times) Gait Pattern/deviations: Step-to pattern, Step-through pattern, Shuffle, Decreased dorsiflexion - left, Ataxic General Gait Details: cues for L foot placement due to tendency for ankle/foot supination.  Cues for "step L, control knee, step right"; seated rest at times and stop for trunk extension and rebalanceing for standing rest at times Gait velocity: slow Gait velocity interpretation: Below normal speed for age/gender    ADL: ADL Overall ADL's : Needs assistance/impaired Eating/Feeding: Set up, Sitting Grooming: Moderate assistance, Standing, Wash/dry hands Grooming Details (indicate cue type and reason): for standing balance Upper Body Bathing: Minimal assistance, Sitting,  Cueing for compensatory techniques Upper Body Bathing Details (indicate cue type and reason): Pt performing UB bathing while seated in recliner with Min A for back and cues for compensatory techniques due to weakness in LUE. Pt able to bring LUE to R axialry area with increased time and effort, pt reporting "It is hard to apply any pressure to clean under my arm." Lower Body Bathing: Maximal assistance, Sit to/from stand Upper Body Dressing : Minimal assistance, Sitting Upper Body Dressing Details (indicate cue type and reason): Pt doffing/donning gown with Min A for managing gown and VCs for compensatory tehcniques. Donning LUE first. Lower Body Dressing: Moderate assistance Lower Body Dressing Details (indicate cue type and reason): to don socks in sitting Toilet Transfer: Moderate assistance, Ambulation(HHA and gait belt) Toilet Transfer Details (indicate cue type and reason): Simulated with functional mobility in room Functional mobility during ADLs: Moderate assistance(HHA) General ADL Comments: Encouraged functional use of LUE and purposeful movement/exercises  Cognition: Cognition Overall Cognitive Status: Within Functional Limits for tasks assessed Arousal/Alertness: Awake/alert Orientation Level: Oriented X4 Attention: Alternating Alternating Attention: Appears intact Memory: Appears intact Awareness: Appears intact Problem Solving: Appears intact Executive Function: Reasoning Reasoning: Appears intact Cognition Arousal/Alertness: Awake/alert Behavior During Therapy: WFL for tasks assessed/performed Overall Cognitive Status: Within Functional Limits for tasks assessed Area of Impairment: Problem solving Memory: Decreased short-term memory Problem Solving: Requires verbal cues, Difficulty sequencing, Requires tactile cues General Comments: pt emotional today stating "I don't want to be a burden"   Blood pressure 122/88, pulse (!) 107,  temperature 98.1 F (36.7 C),  temperature source Oral, resp. rate (!) 24, height '5\' 9"'$  (1.753 m), weight 88 kg (194 lb 0.1 oz), SpO2 97 %. Physical Exam  Nursing note and vitals reviewed. Constitutional: He is oriented to person, place, and time. He appears well-developed and well-nourished. No distress.  HENT:  Head: Normocephalic and atraumatic.  Eyes: Conjunctivae and EOM are normal. Pupils are equal, round, and reactive to light.  Neck: Normal range of motion. Neck supple.  Cardiovascular: Regular rhythm. Tachycardia present.  HR ranging from 110-120 at rest  Respiratory: Effort normal and breath sounds normal. No stridor. No respiratory distress. He has no wheezes.  GI: Soft. Bowel sounds are normal. He exhibits no distension. There is no tenderness.  Musculoskeletal: He exhibits no edema or tenderness.  Neurological: He is alert and oriented to person, place, and time.  Left facial weakness with mild dysarthria. Able to follow basic commands without difficulty. Left sided weakness noted. LUE grossly 2- out of 5 proximal to distal.  Left lower extremity: Hip flexors 2+ knee extensors 3- ankle dorsiflexors and plantar flexors trace.  Left heel cord is tight  Skin: Skin is warm and dry. He is not diaphoretic.  Psychiatric: He has a normal mood and affect. His speech is normal. Cognition and memory are normal. He expresses impulsivity.  Mood slightly depressed     Results for orders placed or performed during the hospital encounter of 07/02/17 (from the past 48 hour(s))  Glucose, capillary     Status: Abnormal   Collection Time: 07/04/17  9:03 PM  Result Value Ref Range   Glucose-Capillary 113 (H) 65 - 99 mg/dL  Basic metabolic panel     Status: Abnormal   Collection Time: 07/05/17  2:57 AM  Result Value Ref Range   Sodium 137 135 - 145 mmol/L   Potassium 3.8 3.5 - 5.1 mmol/L   Chloride 101 101 - 111 mmol/L   CO2 24 22 - 32 mmol/L   Glucose, Bld 107 (H) 65 - 99 mg/dL   BUN 12 6 - 20 mg/dL   Creatinine, Ser  0.91 0.61 - 1.24 mg/dL   Calcium 9.5 8.9 - 10.3 mg/dL   GFR calc non Af Amer >60 >60 mL/min   GFR calc Af Amer >60 >60 mL/min    Comment: (NOTE) The eGFR has been calculated using the CKD EPI equation. This calculation has not been validated in all clinical situations. eGFR's persistently <60 mL/min signify possible Chronic Kidney Disease.    Anion gap 12 5 - 15  CBC with Differential/Platelet     Status: Abnormal   Collection Time: 07/05/17  2:57 AM  Result Value Ref Range   WBC 11.6 (H) 4.0 - 10.5 K/uL   RBC 5.46 4.22 - 5.81 MIL/uL   Hemoglobin 16.5 13.0 - 17.0 g/dL   HCT 49.8 39.0 - 52.0 %   MCV 91.2 78.0 - 100.0 fL   MCH 30.2 26.0 - 34.0 pg   MCHC 33.1 30.0 - 36.0 g/dL   RDW 13.7 11.5 - 15.5 %   Platelets 311 150 - 400 K/uL   Neutrophils Relative % 42 %   Neutro Abs 4.9 1.7 - 7.7 K/uL   Lymphocytes Relative 48 %   Lymphs Abs 5.6 (H) 0.7 - 4.0 K/uL   Monocytes Relative 8 %   Monocytes Absolute 1.0 0.1 - 1.0 K/uL   Eosinophils Relative 2 %   Eosinophils Absolute 0.2 0.0 - 0.7 K/uL   Basophils Relative 0 %  Basophils Absolute 0.0 0.0 - 0.1 K/uL  Glucose, capillary     Status: Abnormal   Collection Time: 07/05/17  6:47 AM  Result Value Ref Range   Glucose-Capillary 115 (H) 65 - 99 mg/dL  Surgical pcr screen     Status: None   Collection Time: 07/05/17  8:07 AM  Result Value Ref Range   MRSA, PCR NEGATIVE NEGATIVE   Staphylococcus aureus NEGATIVE NEGATIVE    Comment: (NOTE) The Xpert SA Assay (FDA approved for NASAL specimens in patients 81 years of age and older), is one component of a comprehensive surveillance program. It is not intended to diagnose infection nor to guide or monitor treatment.   I-STAT 3, arterial blood gas (G3+)     Status: Abnormal   Collection Time: 07/05/17  1:15 PM  Result Value Ref Range   pH, Arterial 7.408 7.350 - 7.450   pCO2 arterial 32.9 32.0 - 48.0 mmHg   pO2, Arterial 72.0 (L) 83.0 - 108.0 mmHg   Bicarbonate 20.7 20.0 - 28.0  mmol/L   TCO2 22 22 - 32 mmol/L   O2 Saturation 95.0 %   Acid-base deficit 3.0 (H) 0.0 - 2.0 mmol/L   Patient temperature HIDE    Sample type ARTERIAL   I-STAT 3, venous blood gas (G3P V)     Status: Abnormal   Collection Time: 07/05/17  1:19 PM  Result Value Ref Range   pH, Ven 7.372 7.250 - 7.430   pCO2, Ven 41.7 (L) 44.0 - 60.0 mmHg   pO2, Ven 35.0 32.0 - 45.0 mmHg   Bicarbonate 24.2 20.0 - 28.0 mmol/L   TCO2 25 22 - 32 mmol/L   O2 Saturation 66.0 %   Acid-base deficit 1.0 0.0 - 2.0 mmol/L   Patient temperature HIDE    Sample type VENOUS    Comment NOTIFIED PHYSICIAN   I-STAT 3, venous blood gas (G3P V)     Status: Abnormal   Collection Time: 07/05/17  1:20 PM  Result Value Ref Range   pH, Ven 7.380 7.250 - 7.430   pCO2, Ven 41.1 (L) 44.0 - 60.0 mmHg   pO2, Ven 35.0 32.0 - 45.0 mmHg   Bicarbonate 24.3 20.0 - 28.0 mmol/L   TCO2 26 22 - 32 mmol/L   O2 Saturation 67.0 %   Acid-base deficit 1.0 0.0 - 2.0 mmol/L   Patient temperature HIDE    Sample type VENOUS    Comment NOTIFIED PHYSICIAN   Basic metabolic panel     Status: Abnormal   Collection Time: 07/06/17  3:18 AM  Result Value Ref Range   Sodium 138 135 - 145 mmol/L   Potassium 4.1 3.5 - 5.1 mmol/L   Chloride 106 101 - 111 mmol/L   CO2 22 22 - 32 mmol/L   Glucose, Bld 103 (H) 65 - 99 mg/dL   BUN 13 6 - 20 mg/dL   Creatinine, Ser 0.94 0.61 - 1.24 mg/dL   Calcium 9.6 8.9 - 10.3 mg/dL   GFR calc non Af Amer >60 >60 mL/min   GFR calc Af Amer >60 >60 mL/min    Comment: (NOTE) The eGFR has been calculated using the CKD EPI equation. This calculation has not been validated in all clinical situations. eGFR's persistently <60 mL/min signify possible Chronic Kidney Disease.    Anion gap 10 5 - 15  CBC with Differential/Platelet     Status: Abnormal   Collection Time: 07/06/17  3:18 AM  Result Value Ref Range   WBC 12.6 (  H) 4.0 - 10.5 K/uL   RBC 5.32 4.22 - 5.81 MIL/uL   Hemoglobin 16.4 13.0 - 17.0 g/dL   HCT 49.0  39.0 - 52.0 %   MCV 92.1 78.0 - 100.0 fL   MCH 30.8 26.0 - 34.0 pg   MCHC 33.5 30.0 - 36.0 g/dL   RDW 13.7 11.5 - 15.5 %   Platelets 300 150 - 400 K/uL   Neutrophils Relative % 40 %   Lymphocytes Relative 49 %   Monocytes Relative 9 %   Eosinophils Relative 2 %   Basophils Relative 0 %   Neutro Abs 5.0 1.7 - 7.7 K/uL   Lymphs Abs 6.2 (H) 0.7 - 4.0 K/uL   Monocytes Absolute 1.1 (H) 0.1 - 1.0 K/uL   Eosinophils Absolute 0.3 0.0 - 0.7 K/uL   Basophils Absolute 0.0 0.0 - 0.1 K/uL   WBC Morphology ABSOLUTE LYMPHOCYTOSIS   Pathologist smear review     Status: None   Collection Time: 07/06/17  3:18 AM  Result Value Ref Range   Path Review Lymphocytosis with reactive appearing lymphocyte     Comment: Reviewed by Lennox Solders. Lyndon Code, M.D. Date 07/06/2017    No results found.     Medical Problem List and Plan: 1.  Functional deficits and left hemiparesis secondary to acute right pontine infarct/subacute right temporal/occipital infarcts 2.  DVT Prophylaxis/Anticoagulation: Pharmaceutical: Coumadin and Heparin 3. Pain Management: tylenol prn  4. Mood: LCSW to follow for evaluation and support.   Will ask neuropsychology to-follow-up with the patient as he is expressing signs and symptoms of depression 5. Neuropsych: This patient is capable of making decisions on his own behalf. 6. Skin/Wound Care: Routine pressure relief measures.  7. Fluids/Electrolytes/Nutrition: Monitor I/O. Check lytes in am.  8. Severe CAD with CM: Treated medically with ASA, statin and  9. Acute systolic CHF: Was started spironolactone and digoxin on 12/7. Monitor BP and avoid hypotension/hypoperfusion.  10. Prediabetes: Hgb A1C- 6.0. Will have dietitian educate patient on CM diet--monitor BS for trends.  57. Leucocytosis:Monitor for signs of infection.  12. Polysubstance abuse: Has been counseled on importance of alcohol, cocaine and THC cessation.    Post Admission Physician Evaluation: 1. Functional deficits  secondary  to right pontine infarct as well as other subacute infarcts. 2. Patient is admitted to receive collaborative, interdisciplinary care between the physiatrist, rehab nursing staff, and therapy team. 3. Patient's level of medical complexity and substantial therapy needs in context of that medical necessity cannot be provided at a lesser intensity of care such as a SNF. 4. Patient has experienced substantial functional loss from his/her baseline which was documented above under the "Functional History" and "Functional Status" headings.  Judging by the patient's diagnosis, physical exam, and functional history, the patient has potential for functional progress which will result in measurable gains while on inpatient rehab.  These gains will be of substantial and practical use upon discharge  in facilitating mobility and self-care at the household level. 5. Physiatrist will provide 24 hour management of medical needs as well as oversight of the therapy plan/treatment and provide guidance as appropriate regarding the interaction of the two. 6. The Preadmission Screening has been reviewed and patient status is unchanged unless otherwise stated above. 7. 24 hour rehab nursing will assist with bladder management, bowel management, safety, skin/wound care, disease management, medication administration, pain management and patient education  and help integrate therapy concepts, techniques,education, etc. 8. PT will assess and treat for/with: Lower extremity strength, range of  motion, stamina, balance, functional mobility, safety, adaptive techniques and equipment, neuromuscular reeducation, ego support, community reentry.   Goals are: Modified independent to supervision. 9. OT will assess and treat for/with: ADL's, functional mobility, safety, upper extremity strength, adaptive techniques and equipment, neuromuscular reeducation, use support, family education.   Goals are: Modified independent to supervision.  Therapy may proceed with showering this patient. 10. SLP will assess and treat for/with: Speech intelligibility, communication, swallowing.  Goals are: Modified independent. 11. Case Management and Social Worker will assess and treat for psychological issues and discharge planning. 12. Team conference will be held weekly to assess progress toward goals and to determine barriers to discharge. 13. Patient will receive at least 3 hours of therapy per day at least 5 days per week. 14. ELOS: 13-18 days       15. Prognosis:  excellent     Meredith Staggers, MD, [facility] 07/06/2017  Bary Leriche, Hershal Coria 07/06/2017

## 2017-07-04 NOTE — Progress Notes (Signed)
STROKE TEAM PROGRESS NOTE   SUBJECTIVE (INTERVAL HISTORY) Wife is at the bedside. Patient is found sitting in bed in NAD  Overall he feels his condition is unchanged. Affect better today. Imaging and testing results reviewed. Voices no new complaints. No new events reported overnight. TTE showed EF 20-25%. Cardiology consult requested. Hoping to be transferred to CIR soon.  OBJECTIVE Lab Results: CBC:  Recent Labs  Lab 07/02/17 1440 07/02/17 1448 07/03/17 0759 07/04/17 0250  WBC 13.6*  --  12.6* 11.4*  HGB 16.4 17.3* 16.4 16.4  HCT 48.4 51.0 48.7 49.7  MCV 91.5  --  91.4 92.6  PLT 310  --  281 282   BMP: Recent Labs  Lab 07/02/17 1440 07/02/17 1448 07/03/17 0759 07/04/17 0250  NA 137 140 137 136  K 4.6 4.4 3.8 4.2  CL 106 106 105 102  CO2 19*  --  23 23  GLUCOSE 140* 153* 143* 113*  BUN 12 13 7 9   CREATININE 1.14 1.00 0.85 1.01  CALCIUM 9.6  --  9.4 9.4   Coagulation Studies:  Recent Labs    07/02/17 1440  APTT 28  INR 0.96   Urine Drug Screen:     Component Value Date/Time   LABOPIA NONE DETECTED 07/02/2017 1758   COCAINSCRNUR POSITIVE (A) 07/02/2017 1758   LABBENZ NONE DETECTED 07/02/2017 1758   AMPHETMU NONE DETECTED 07/02/2017 1758   THCU POSITIVE (A) 07/02/2017 1758   LABBARB NONE DETECTED 07/02/2017 1758    Alcohol Level:  Recent Labs  Lab 07/02/17 1551  ETH <10   PHYSICAL EXAM Temp:  [97.6 F (36.4 C)-98.4 F (36.9 C)] 98.2 F (36.8 C) (12/05 0800) Pulse Rate:  [73-106] 73 (12/05 0800) Resp:  [18-20] 18 (12/05 0800) BP: (106-154)/(68-86) 106/72 (12/05 0800) SpO2:  [95 %-98 %] 97 % (12/05 0800) General - Well nourished, well developed, in no apparent distress Respiratory - Lungs clear bilaterally. No wheezing. Cardiovascular - Regular rate and rhythm   Neurological Examination Mental Status: Alert, oriented, thought content appropriate.  Speech fluent with mild dysarthria but easily understandable; no evidence of aphasia.  Able to  follow 3 step commands without difficulty. Cranial Nerves: II: +Left upper and lower quadrant vision loss III,IV, VI: ptosis not present, extra-ocular motions intact bilaterally, pupils equal, round, reactive to light and accommodation.  V,VII: subtle left facial droop, facial light touch sensation normal bilaterally VIII: hearing normal bilaterally IX,X: uvula rises symmetrically XI: bilateral shoulder shrug XII: midline tongue extension Motor: Right :  Upper extremity  5/5             Lower extremity  5/5                             Left:     Upper extremity   4/5; mildly decreased left grip strength 4/5             Lower extremity   5/5; 5/5 with plantar flexion, 4/5 on dorsiflexion Tone and bulk:normal tone throughout; no atrophy noted Sensory: Pinprick and light touch intact throughout, bilaterally Deep Tendon Reflexes: 2+ and symmetric throughout Plantars: Right: downgoing                                Left: downgoing Cerebellar: +dysmetria with finger-to-nose on left,  Gait: not tested  IMAGING: I have personally reviewed the radiological images below and agree  with the radiology interpretations.  MRI/ MRA Brain& Head Wo Contrast Result Date: 07/02/2017 IMPRESSION: MRI HEAD: 1. 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 2. Subacute small RIGHT temporal occipital lobe/PCA territory infarct. Old LEFT occipital lobe/PCA territory infarct. MRA HEAD: 1. No emergent large vessel occlusion. 2. Severe stenosis distal RIGHT P2 segment and, poor flow related enhancement distal posterior cerebral artery's. Given posterior circulation territory infarcts of varying ages, recommend CTA HEAD and neck to exclude vertebral artery injury and further assess posterior cerebral artery's.   Ct Head Code Stroke Wo Contrast Result Date: 07/02/2017 IMPRESSION: 1. No acute finding. 2. Small bilateral occipital infarcts that have become apparent since 01/15/2017 but are chronic.    Echocardiogram:                                            Study Conclusions - Left ventricle: The cavity size was moderately dilated. Systolic   function was severely reduced. The estimated ejection fraction   was in the range of 20% to 25%. Akinesis of the anterior and   apical myocardium. Features are consistent with a pseudonormal   left ventricular filling pattern, with concomitant abnormal   relaxation and increased filling pressure (grade 2 diastolic   dysfunction).  CTA Head/Neck:   IMPRESSION: 1. No emergent vascular finding. 2. Prominent noncalcified atherosclerotic plaque in the left subclavian artery with up to 40% stenosis. No superimposed ulceration. 3. Intracranial atherosclerosis along the bilateral carotid siphons with mild left paraclinoid ICA narrowing. Known high-grade right P2 segment stenosis. 4.  Emphysema                                                _____________________________________________________________________ ASSESSMENT: [name] is a 46 y.o. male with PMH Polysubstance Abuse, Anxiety and Depression presenting with slurred speech, Left arm/leg weakness. CT head with no acute abnormalities but does show evidence of chronic bilateral occipital infracts. 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 CTA Head CTA head and neck confirmed high-grade right P2 stenosis and bilateral carotid siphon atherosclerosis.  TTE showed EF 20-25%.  LDL unable to calculate due to high TG at 539, A1c 6.0.  UDS showed positive cocaine and THC.  His cardiomyopathy may likely due to substance abuse. Recommend cardiology consultation for cardiomyopathy.  He is currently on aspirin and Lipitor 80 with fenofibrate.  Due to stroke with low EF, may consider Coumadin for stroke prevention, and repeat 2D  echo in 3 months, if EF more than 30%, may consider to discontinue Coumadin.   07/03/17: Patient continues to have Left eye vision loss, Left sided weakness, Left facial droop. Imaging thus far and lab results reviewed with patient and wife at bedside. Long discussion regarding stroke risk factors and lifestyle choices. Patient explains that he recently went to see an outpatient Neurologist Dr Lucia GaskinsAhern on 06/18/17 for a complete stroke work up because of his family hx and he has been experiencing some H/A's and Left low quadrant vision loss x few months after experiencing a fall with head trauma. She ordered an MRI Brain schedule for 07/06/17 and  a referral to outpatient Opthalmology. Awaiting results of CTA and ECHO to make further recommendations. POC discussed with Dr Sharolyn Douglas  07/04/17: Neuro exam remains stable. Left sided weakness unchanged.  Working with therapies.  Will likely be discharged to CIR once insurance approved.  Plan of care discussed with Dr. Cena Benton for recommended cardiology consult and decision on anticoagulation therapy. Patient will follow up with cardiology and neurology once discharged from CIR.  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 Stroke Risk Factors: family history, hyperlipidemia, smoking and polysubstance abuse Other Stroke Risk Factors: Cigarette smoker, Polysubstance Abuse, ETOH use, Fam hx CVA  Outstanding Stroke Work-up Studies:      Workup is completed  PLAN  07/04/2017: Continue Aspirin/ Statin & Fenofibrate Ongoing aggressive stroke risk factor management Aggressive PT/OT/SLP CIR Consultation Case Management consultation for PCP Patient counseled to be  compliant with his antithrombotic medications Follow up with Neurology in 6 weeks  HYPERTENSION: Stable Permissive hypertension (OK if <220/120) for 24-48 hours post stroke and then gradually normalized within 5-7 days. Long term BP goal normotensive. Home Meds: NONE  HYPERLIPIDEMIA:    Component Value Date/Time   CHOL 303 (H) 07/03/2017 0233   TRIG 539 (H) 07/03/2017 0233   HDL 31 (L) 07/03/2017 0233   CHOLHDL 9.8 07/03/2017 0233   VLDL UNABLE TO CALCULATE IF TRIGLYCERIDE OVER 400 mg/dL 16/05/9603 5409   LDLCALC UNABLE TO CALCULATE IF TRIGLYCERIDE OVER 400 mg/dL 81/19/1478 2956  Home Meds:  NONE LDL  goal < 70 Started on  Lipitor to 80 mg daily and Fenofibrate 160 mg daily Continue statin at discharge  R/O DIABETES: Lab Results  Component Value Date   HGBA1C 6.0 (H) 07/03/2017  HgbA1c goal < 7.0  TOBACCO ABUSE Current smoker Smoking cessation counseling provided Nicotine patch provided   ADHD Adderall 20 mg QID - attempt to decrease dose at discharge   Polysubstance Abuse Counseled to quit CIWA PRN Management per Medicine Team  Other Active Problems: Principal Problem:   CVA (cerebral vascular accident) (HCC) Active Problems:   Alcohol use   Anxiety   Depression   Acute hyperglycemia   Elevated blood-pressure reading without diagnosis of hypertension   ADHD   Cocaine abuse (HCC)   Myocardiopathy (HCC)   Mixed hyperlipidemia  Hospital day # 2  VTE prophylaxis: Lovenox  Diet : Diet Heart Room service appropriate? Yes; Fluid consistency: Thin   Prior Home Stroke Medications: No antithrombotic Hospital Current Stroke Medications:  aspirin 325 mg daily and Lipitor 80 mg and Fenofibrate 160 mg Stroke New Meds Plan: Now on aspirin 325 mg daily and Lipitor 80 mg and Fenofibrate 160 mg Discharge Stroke Meds: Please discharge patient on: Aspirin 325 mg daily and Lipitor 80 mg and Fenofibrate 160 mg  Disposition: CIR - ins pending Therapy Recs:  CIR Follow  Recs:  Follow-up Information    Anson Fret, MD. Schedule an appointment as soon as possible for a visit in 6 week(s).   Specialty:  Neurology Contact information: [address] [place] [address] [phone]          Patient, No Pcp Per- Case Management aware  FAMILY UPDATES: Wife at bedside  TEAM UPDATES: Penny Pia, MD  Beryl Meager, ANP-C  Stroke Neurology Team 07/04/2017 12:44 PM   Attending note: I reviewed above note and agree with the assessment and plan. I have made any additions or clarifications directly to the above note. Pt was seen and examined.   46 year old male with history of smoker, anxiety, substance abuse admitted for left sided weakness and slurred speech.  MRI showed acute right pontine infarct, subacute right PCA infarct, and old left PCA infarct.  MRA showed right P2 stenosis and left siphon atherosclerosis.  CTA head and neck confirmed high-grade right P2 stenosis and bilateral carotid siphon atherosclerosis.  TTE showed EF 20-25%.  LDL unable to calculate due to high TG at 539, A1c 6.0.  UDS showed positive cocaine and THC.  His cardiomyopathy may likely due to substance abuse. Cardiology consultation pending.  He is currently on aspirin and Lipitor 80 with fenofibrate.  Due to stroke with low EF, recommend Coumadin with INR goal 2-3 for stroke prevention until EF > 30%.   Marvel Plan, MD PhD Stroke Neurology 07/04/2017 4:29 PM   To contact Stroke Continuity provider, please refer to [email or web address]. After hours, contact General Neurology

## 2017-07-05 ENCOUNTER — Encounter (HOSPITAL_COMMUNITY)
Admission: EM | Disposition: A | Discharge status: Rehabilitation Facility | Payer: Self-pay | Source: Home / Self Care | Attending: Family Medicine

## 2017-07-05 HISTORY — PX: RIGHT/LEFT HEART CATH AND CORONARY ANGIOGRAPHY: CATH118266

## 2017-07-05 LAB — SURGICAL PCR SCREEN
MRSA, PCR: NEGATIVE
STAPHYLOCOCCUS AUREUS: NEGATIVE

## 2017-07-05 LAB — BASIC METABOLIC PANEL
Anion gap: 12 (ref 5–15)
BUN: 12 mg/dL (ref 6–20)
CALCIUM: 9.5 mg/dL (ref 8.9–10.3)
CO2: 24 mmol/L (ref 22–32)
CREATININE: 0.91 mg/dL (ref 0.61–1.24)
Chloride: 101 mmol/L (ref 101–111)
GFR calc Af Amer: >60 mL/min (ref >60)
GLUCOSE: 107 mg/dL — AB (ref 65–99)
POTASSIUM: 3.8 mmol/L (ref 3.5–5.1)
SODIUM: 137 mmol/L (ref 135–145)

## 2017-07-05 LAB — POCT I-STAT 3, VENOUS BLOOD GAS (G3P V)
ACID-BASE DEFICIT: 1 mmol/L (ref 0.0–2.0)
ACID-BASE DEFICIT: 1 mmol/L (ref 0.0–2.0)
BICARBONATE: 24.2 mmol/L (ref 20.0–28.0)
BICARBONATE: 24.3 mmol/L (ref 20.0–28.0)
O2 SAT: 66 %
O2 SAT: 67 %
PH VEN: 7.38 (ref 7.250–7.430)
PO2 VEN: 35 mmHg (ref 32.0–45.0)
TCO2: 25 mmol/L (ref 22–32)
TCO2: 26 mmol/L (ref 22–32)
pCO2, Ven: 41.1 mmHg — ABNORMAL LOW (ref 44.0–60.0)
pCO2, Ven: 41.7 mmHg — ABNORMAL LOW (ref 44.0–60.0)
pH, Ven: 7.372 (ref 7.250–7.430)
pO2, Ven: 35 mmHg (ref 32.0–45.0)

## 2017-07-05 LAB — CBC WITH DIFFERENTIAL/PLATELET
BASOS ABS: 0 10*3/uL (ref 0.0–0.1)
BASOS PCT: 0 %
EOS ABS: 0.2 10*3/uL (ref 0.0–0.7)
EOS PCT: 2 %
HCT: 49.8 % (ref 39.0–52.0)
Hemoglobin: 16.5 g/dL (ref 13.0–17.0)
Lymphocytes Relative: 48 %
Lymphs Abs: 5.6 10*3/uL — ABNORMAL HIGH (ref 0.7–4.0)
MCH: 30.2 pg (ref 26.0–34.0)
MCHC: 33.1 g/dL (ref 30.0–36.0)
MCV: 91.2 fL (ref 78.0–100.0)
MONO ABS: 1 10*3/uL (ref 0.1–1.0)
Monocytes Relative: 8 %
Neutro Abs: 4.9 10*3/uL (ref 1.7–7.7)
Neutrophils Relative %: 42 %
PLATELETS: 311 10*3/uL (ref 150–400)
RBC: 5.46 MIL/uL (ref 4.22–5.81)
RDW: 13.7 % (ref 11.5–15.5)
WBC: 11.6 10*3/uL — AB (ref 4.0–10.5)

## 2017-07-05 LAB — GLUCOSE, CAPILLARY: GLUCOSE-CAPILLARY: 115 mg/dL — AB (ref 65–99)

## 2017-07-05 SURGERY — RIGHT/LEFT HEART CATH AND CORONARY ANGIOGRAPHY
Anesthesia: LOCAL

## 2017-07-05 MED ORDER — ASPIRIN 81 MG PO CHEW
81.0000 mg | CHEWABLE_TABLET | Freq: Every day | ORAL | Status: DC
  Administered 2017-07-06: 81 mg via ORAL
  Filled 2017-07-05: qty 1

## 2017-07-05 MED ORDER — MIDAZOLAM HCL 2 MG/2ML IJ SOLN
INTRAMUSCULAR | Status: DC | PRN
Start: 2017-07-05 — End: 2017-07-05
  Administered 2017-07-05: 2 mg via INTRAVENOUS

## 2017-07-05 MED ORDER — MIDAZOLAM HCL 2 MG/2ML IJ SOLN
INTRAMUSCULAR | Status: AC
  Filled 2017-07-05: qty 2

## 2017-07-05 MED ORDER — SODIUM CHLORIDE 0.9% FLUSH: 3.0000 mL | INTRAVENOUS | Status: DC | PRN

## 2017-07-05 MED ORDER — SODIUM CHLORIDE 0.9% FLUSH
3.0000 mL | Freq: Two times a day (BID) | INTRAVENOUS | Status: DC
  Administered 2017-07-06: 3 mL via INTRAVENOUS

## 2017-07-05 MED ORDER — SODIUM CHLORIDE 0.9 % IV SOLN: 250.0000 mL | INTRAVENOUS | Status: DC | PRN

## 2017-07-05 MED ORDER — LIDOCAINE HCL (PF) 1 % IJ SOLN
INTRAMUSCULAR | Status: AC
  Filled 2017-07-05: qty 30

## 2017-07-05 MED ORDER — HEPARIN SODIUM (PORCINE) 5000 UNIT/ML IJ SOLN
5000.0000 [IU] | Freq: Three times a day (TID) | INTRAMUSCULAR | Status: DC
  Administered 2017-07-05 – 2017-07-06 (×2): 5000 [IU] via SUBCUTANEOUS
  Filled 2017-07-05 (×2): qty 1

## 2017-07-05 MED ORDER — HEPARIN SODIUM (PORCINE) 1000 UNIT/ML IJ SOLN
INTRAMUSCULAR | Status: DC | PRN
  Administered 2017-07-05: 4500 [IU] via INTRAVENOUS

## 2017-07-05 MED ORDER — SODIUM CHLORIDE 0.9 % IV SOLN: INTRAVENOUS | Status: AC

## 2017-07-05 MED ORDER — LIDOCAINE HCL (PF) 1 % IJ SOLN
INTRAMUSCULAR | Status: DC | PRN
  Administered 2017-07-05: 5 mL via INTRADERMAL

## 2017-07-05 MED ORDER — IOPAMIDOL (ISOVUE-370) INJECTION 76%
INTRAVENOUS | Status: DC | PRN
  Administered 2017-07-05: 50 mL via INTRA_ARTERIAL

## 2017-07-05 MED ORDER — FENTANYL CITRATE (PF) 100 MCG/2ML IJ SOLN
INTRAMUSCULAR | Status: DC | PRN
  Administered 2017-07-05: 25 ug via INTRAVENOUS

## 2017-07-05 MED ORDER — HEPARIN (PORCINE) IN NACL 2-0.9 UNIT/ML-% IJ SOLN
INTRAMUSCULAR | Status: AC | PRN
  Administered 2017-07-05: 1000 mL

## 2017-07-05 MED ORDER — VERAPAMIL HCL 2.5 MG/ML IV SOLN
INTRAVENOUS | Status: DC | PRN
  Administered 2017-07-05: 10 mL via INTRA_ARTERIAL

## 2017-07-05 MED ORDER — VERAPAMIL HCL 2.5 MG/ML IV SOLN
INTRAVENOUS | Status: AC
  Filled 2017-07-05: qty 2

## 2017-07-05 MED ORDER — FENTANYL CITRATE (PF) 100 MCG/2ML IJ SOLN
INTRAMUSCULAR | Status: AC
  Filled 2017-07-05: qty 2

## 2017-07-05 MED ORDER — SODIUM CHLORIDE 0.9% FLUSH: 3.0000 mL | Freq: Two times a day (BID) | INTRAVENOUS | Status: DC

## 2017-07-05 MED ORDER — HEPARIN SODIUM (PORCINE) 1000 UNIT/ML IJ SOLN
INTRAMUSCULAR | Status: AC
  Filled 2017-07-05: qty 1

## 2017-07-05 MED ORDER — ONDANSETRON HCL 4 MG/2ML IJ SOLN: 4.0000 mg | Freq: Four times a day (QID) | INTRAMUSCULAR | Status: DC | PRN

## 2017-07-05 MED ORDER — SODIUM CHLORIDE 0.9 % IV SOLN
INTRAVENOUS | Status: DC
  Administered 2017-07-05: 05:00:00 via INTRAVENOUS

## 2017-07-05 MED ORDER — IOPAMIDOL (ISOVUE-370) INJECTION 76%
INTRAVENOUS | Status: AC
  Filled 2017-07-05: qty 100

## 2017-07-05 MED ORDER — ACETAMINOPHEN 325 MG PO TABS: 650.0000 mg | ORAL_TABLET | ORAL | Status: DC | PRN

## 2017-07-05 MED ORDER — HEPARIN (PORCINE) IN NACL 2-0.9 UNIT/ML-% IJ SOLN
INTRAMUSCULAR | Status: AC
  Filled 2017-07-05: qty 1000

## 2017-07-05 SURGICAL SUPPLY — 12 items
CATH 5FR JL3.5 JR4 ANG PIG MP (CATHETERS) ×1 IMPLANT
CATH BALLN WEDGE 5F 110CM (CATHETERS) ×1 IMPLANT
DEVICE RAD COMP TR BAND LRG (VASCULAR PRODUCTS) ×1 IMPLANT
GLIDESHEATH SLEND SS 6F .021 (SHEATH) ×1 IMPLANT
GUIDEWIRE INQWIRE 1.5J.035X260 (WIRE) IMPLANT
INQWIRE 1.5J .035X260CM (WIRE) ×2
KIT HEART LEFT (KITS) ×2 IMPLANT
PACK CARDIAC CATHETERIZATION (CUSTOM PROCEDURE TRAY) ×2 IMPLANT
SHEATH GLIDE SLENDER 4/5FR (SHEATH) ×1 IMPLANT
SYR MEDRAD MARK V 150ML (SYRINGE) ×1 IMPLANT
TRANSDUCER W/STOPCOCK (MISCELLANEOUS) ×2 IMPLANT
TUBING CIL FLEX 10 FLL-RA (TUBING) ×2 IMPLANT

## 2017-07-05 NOTE — Progress Notes (Signed)
  Speech Language Pathology Treatment: Cognitive-Linquistic  Patient Details Name: Angel Davies MRN: [id number] DOB: 11/17/1970 Today's Date: 07/05/2017 Time: [id number] SLP Time Calculation (min) (ACUTE ONLY): 16 min  Assessment / Plan / Recommendation Clinical Impression  Patient seen for dysarthria treat. SLP introduced speech intelligibility strategies including slow rate, loud intensity, and overarticulation/open mouth. Patient then able to utilize compensatory strategies into conversation with clinician with min cues to achieve 90% intelligibility. Wife present for education. Patient making gains with goals and will benefit from ongoing treatment.    HPI HPI: 46 y.o.malewith PMH Polysubstance Abuse, Anxiety and Depressionpresenting with slurred speech, Left arm/leg weakness. MRI: 1. 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 2. Subacute small RIGHT temporal occipital lobe/PCA territory infarct. Old LEFT occipital lobe/PCA territory infarct.       SLP Plan  Continue with current plan of care       Recommendations                   General recommendations: Rehab consult Oral Care Recommendations: Oral care BID Follow up Recommendations: Inpatient Rehab SLP Visit Diagnosis: Dysarthria and anarthria (R47.1) Plan: Continue with current plan of care                    [facility] Angel Asbridge MA, CCC-SLP [phone]    Angel Davies 07/05/2017, 1:44 PM

## 2017-07-05 NOTE — Progress Notes (Signed)
Order for TR Band removal verified per post procedural orders. Procedure explained to patient for the removal of the TR Band. Rt. Radial remains a level 0, palpable radial pulse. A gauze and Tegaderm dressing applied to site. Post procedural instructions discussed and return demonstration from patient.

## 2017-07-05 NOTE — H&P (View-Only) (Signed)
Advanced Heart Failure Rounding Note  Primary Cardiologist: New (Dr. Gala RomneyBensimhon)  Subjective:    No complaints this am. Denies CP, SOB, lightheadedness or dizziness. Slightly anxious about cath, but no further questions.   Creatinine stable. Hgb 16.5.   Objective:   Weight Range: 199 lb 4.7 oz (90.4 kg) Body mass index is 29.43 kg/m.   Vital Signs:   Temp:  [98.2 F (36.8 C)-98.7 F (37.1 C)] 98.7 F (37.1 C) (12/06 0500) Pulse Rate:  [76-98] 87 (12/06 0500) Resp:  [18] 18 (12/06 0500) BP: (110-129)/(65-84) 117/68 (12/06 0500) SpO2:  [96 %-98 %] 97 % (12/06 0500) Weight:  [199 lb 4.7 oz (90.4 kg)] 199 lb 4.7 oz (90.4 kg) (12/06 0500) Last BM Date: 07/02/17  Weight change: Filed Weights   07/02/17 1455 07/05/17 0500  Weight: 193 lb (87.5 kg) 199 lb 4.7 oz (90.4 kg)    Intake/Output:   Intake/Output Summary (Last 24 hours) at 07/05/2017 0947 Last data filed at 07/05/2017 0600 Gross per 24 hour  Intake 20 ml  Output 400 ml  Net -380 ml      Physical Exam    General:  Fatigued. NAD. Slurred Speech.  HEENT: Normal Neck: Supple. JVP does not appear elevated. Carotids 2+ bilat; no bruits. No lymphadenopathy or thyromegaly appreciated. Cor: PMI nondisplaced. Regular rate & rhythm. No rubs, gallops or murmurs. Lungs: Clear Abdomen: Soft, nontender, nondistended. No hepatosplenomegaly. No bruits or masses. Good bowel sounds. Extremities: No cyanosis, clubbing, rash, edema Neuro: Alert & orientedx3, cranial nerves grossly intact. ULE/LLE weakness. Mild L facial droop. Affect flat.   Telemetry   NSR, personally reviewed.   EKG    No new tracings.   Labs    CBC Recent Labs    07/04/17 0250 07/05/17 0257  WBC 11.4* 11.6*  NEUTROABS 4.3 4.9  HGB 16.4 16.5  HCT 49.7 49.8  MCV 92.6 91.2  PLT 282 311   Basic Metabolic Panel Recent Labs    02/72/5310/12/16 0250 07/05/17 0257  NA 136 137  K 4.2 3.8  CL 102 101  CO2 23 24  GLUCOSE 113* 107*  BUN 9 12    CREATININE 1.01 0.91  CALCIUM 9.4 9.5   Liver Function Tests Recent Labs    07/02/17 1440  AST 32  ALT 34  ALKPHOS 83  BILITOT 0.6  PROT 7.9  ALBUMIN 4.4   No results for input(s): LIPASE, AMYLASE in the last 72 hours. Cardiac Enzymes No results for input(s): CKTOTAL, CKMB, CKMBINDEX, TROPONINI in the last 72 hours.  BNP: BNP (last 3 results) No results for input(s): BNP in the last 8760 hours.  ProBNP (last 3 results) No results for input(s): PROBNP in the last 8760 hours.   D-Dimer No results for input(s): DDIMER in the last 72 hours. Hemoglobin A1C Recent Labs    07/03/17 0233  HGBA1C 6.0*   Fasting Lipid Panel Recent Labs    07/03/17 0233  CHOL 303*  HDL 31*  LDLCALC UNABLE TO CALCULATE IF TRIGLYCERIDE OVER 400 mg/dL  TRIG [id number]*  CHOLHDL 9.8   Thyroid Function Tests No results for input(s): TSH, T4TOTAL, T3FREE, THYROIDAB in the last 72 hours.  Invalid input(s): FREET3  Other results:   Imaging     No results found.   Medications:     Scheduled Medications: .  stroke: mapping our early stages of recovery book   Does not apply Once  . aspirin  300 mg Rectal Daily   Or  . aspirin  325 mg Oral Daily  . atorvastatin  80 mg Oral q1800  . enoxaparin (LOVENOX) injection  40 mg Subcutaneous Q24H  . fenofibrate  160 mg Oral Daily  . folic acid  1 mg Oral Daily  . multivitamin with minerals  1 tablet Oral Daily  . nicotine  14 mg Transdermal Daily  . sodium chloride flush  3 mL Intravenous Q12H  . thiamine  100 mg Oral Daily     Infusions: . sodium chloride    . sodium chloride 10 mL/hr at 07/05/17 0500     PRN Medications:  sodium chloride, acetaminophen **OR** acetaminophen (TYLENOL) oral liquid 160 mg/5 mL **OR** acetaminophen, clonazePAM, hydrALAZINE, LORazepam **OR** LORazepam, sodium chloride flush, traZODone    Patient Profile   Deloy D Carrier is a 46 y.o. male with PMH of HTN, ADHD, obesity, anxiety, and depression.    Presented 07/02/17 with CVA. Found to have systolic CHF by echo.  UDS notably + for cocaine.  Assessment/Plan   1. 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 - With EF < 30%, will need anticoagulation moving forward. Suspect cardioembolic in etiology. - Plan for CIR after cath.   2. Acute systolic CHF - Echo 07/02/17 with LVEF 20-25%, grade 2 DD, Trivial MR, Trivial TR - Unclear etiology, suspect CAD based on Echo with WMAs and apex hypokinesis. ? Component from substance abuse as well. - BPs remains in 100-110s.  - Plan L/RHC this afternoon.  - Will need repeat echo 3 months after med optimization.   3. HTN - Will adjust meds in setting of treating his HF. Will not be overly aggressive with recent CVA.  - Will await cath to decide best meds to start, and letting BP ride for with recent CVA.   4. Cocaine abuse - Stressed importance of complete cessation. No change.   5. Alcohol abuse - Stressed importance of complete cessation. No change.   6. HLD - Started on lipitor and fenofibrate. No change.   7. Hypertriglyceridemia - Now on Lipitor and Fenofibrate. No change.   Medication concerns reviewed with patient and pharmacy team. Barriers identified: None at this time.   Length of Stay: 3  [name], PA-C  07/05/2017, 9:47 AM  Advanced Heart Failure Team Pager [phone] (M-F; 7a - 4p)  Please contact [facility] for night-coverage after hours (4p -7a ) and weekends on [email or web address]  Patient seen and examined with the above-signed Advanced Practice Provider and/or Housestaff. I personally reviewed laboratory data, imaging studies and relevant notes. I independently examined the patient and formulated the important aspects of the plan. I have edited the note to reflect any of my changes or salient points. I have personally discussed the plan with the patient  and/or family.  Feels ok. Denies CP or SOB. Vitals stable. Still weak on left side. Planning cath today. Procedure discussed.   Daniel Bensimhon, MD  12:34 PM     

## 2017-07-05 NOTE — Progress Notes (Signed)
PROGRESS NOTE  Angel Davies ZOX:[id number] DOB: 04/15/1971 DOA: 07/02/2017 PCP: Patient, No Pcp Per  HPI/Recap of past 24 hours: HPI from Angel Davies, Angel Davies on 07/02/17  Angel Davies is a 46 y.o. male with medical history significant for ADHD, obesity, anxiety with depression.  Patient presented via private vehicle with strokelike symptoms.  Reportedly went to bed, woke up, was unable to bear weight on the left leg and at that time noticed not only left leg weakness but left-sided numbness, slurred speech and a left facial droop. Patient didn't seek any medical attention till later in the afternoon on 07/02/17. Patient admits to drinking half a box of wine and using cocaine the night before presentation. Pt also uses marijuana 4 times a week  Of note, pt had been seen by neurology Dr. Lucia Davies on 11/19 with a chief complaint of history of strokes in the family, hx of fall, last in June, which was preceded by vision loss in the left eye peripherally and was associated with headaches. An outpatient MRI of the brain with and without contrast had been planned for 07/06/17. In the ED, CT of the head revealed small bilateral occipital infarcts. Neurology was consulted.  A code stroke was called but pt was not a candidate for thrombolytic therapy  Today, pt reported feeling "ok". Denies any new complaints, no chest pain, SOB, abdominal pain, N/V/D/C, fever/chills. Pt still noted with LUE & LLE weakness, facial droop with improved speech. Wife at bedside.   Assessment/Plan: Principal Problem:   CVA (cerebral vascular accident) (HCC) Active Problems:   Alcohol use   Anxiety   Depression   Acute hyperglycemia   Elevated blood-pressure reading without diagnosis of hypertension   ADHD   Cocaine abuse (HCC)   Myocardiopathy (HCC)   Mixed hyperlipidemia   Acute systolic heart failure (HCC)  # Acute right pontine infarct Pt with LUE/LLE weakness, facial droop Strong positive FH of strokes, cocaine  abuse, HLD, HTN -CT head demonstrates chronic bilateral occipital infarcts -MRI head: Acute 2.3 x 1.2 cm nonhemorrhagic RIGHT pontine infarct. Subacute small RIGHT   temporal occipital lobe/PCA territory infarct. Old LEFT occipital lobe/PCA territory infarct.  -CT angio head/neck: Prominent noncalcified atherosclerotic plaque in the left subclavian artery with    up to 40% stenosis. No superimposed ulceration. Intracranial atherosclerosis along the bilateral    carotid siphons with mild left paraclinoid ICA narrowing. Known high-grade right P2 segment    Stenosis -Echocardiogram: LVEF 20-25%, Grade II DD, +RWMA  -Telemetry, EKG SR -Lipid panel: TC 303, TG 539, LDL unable to calculate -A1c: 6, pre-diabetic -ASA 325mg , lipitor 80mg , fenofibrate 160mg  daily -Frequent neuro checks -Neurology consulted, apprec recs -Consulted cardiology for cardiomyopathy -PT/OT/SLP evaluation, recommend CIR, consult placed  #HLD -Lipid panel: TC 303, TG 539, LDL unable to calculate -Lipitor + fenofibrate  #Newly diagnosed CHF -Appears euvolemic, ??ischemic -LVEF 20-25%, Grade II DD, +RWMA - Cards on board awaiting evaluation and recommendations. Plan is for cardiac cath today.   #Pre-diabetes -A1c 6 -Follow up as outpt, lifestyle modification advised  #??hypertension -Continue to monitor trends -Given acute stroke symptoms will allow for permissive hypertension -IV hydralazine prn SBP>/= 220 or DBP>/= 110  #Polysubstance abuse +Cocaine, marijuana, tobacco abuse -UDS, + for cocaine, THC -Counseled regarding absolute cessation of both substances -Nicotine patch provided  #Alcohol use -Patient denies daily use of alcohol -Admitted to drinking one half of a box of wine on 07/02/17 -CIWA prn -FA, MVT, Thiamine  #Anxiety/Depression -Continue home Klonopin and  trazodone  #ADHD -Continue home Adderall  Code Status: Full  Family Communication: Spoke with wife  Disposition Plan: CIR  after cardiology work up   Consultants:  Neurology  Procedures:  None   Antimicrobials:  None  DVT prophylaxis:  Lovenox   Objective: Vitals:   07/05/17 1420 07/05/17 1430 07/05/17 1451 07/05/17 1500  BP: 120/76 116/69 (!) 118/93   Pulse: 79 62 78 85  Resp: 19 18 18 19   Temp:      TempSrc:      SpO2: 99% 99% 99% 94%  Weight:      Height:        Intake/Output Summary (Last 24 hours) at 07/05/2017 1612 Last data filed at 07/05/2017 0600 Gross per 24 hour  Intake 20 ml  Output 400 ml  Net -380 ml   Filed Weights   07/02/17 1455 07/05/17 0500  Weight: 87.5 kg (193 lb) 90.4 kg (199 lb 4.7 oz)    Exam: Exam unchanged when compared to 12/6 2018   General:  AAO X 3, facial droop, improved speech  Cardiovascular: S1-S2 present, no added heart sound  Respiratory: Chest clear B/L, equal chest rise  Abdomen: Soft, non-tender, non-distended, BS present  Musculoskeletal: No pedal edema bilaterally   Skin: Normal  Psychiatry: Depressed mood  Neuro: LUE & LLE 4/5, RUE/RLE 5/5, +dysmetria with finger to nose on left   Data Reviewed: CBC: Recent Labs  Lab 07/02/17 1440 07/02/17 1448 07/03/17 0759 07/04/17 0250 07/05/17 0257  WBC 13.6*  --  12.6* 11.4* 11.6*  NEUTROABS 9.3*  --  6.0 4.3 4.9  HGB 16.4 17.3* 16.4 16.4 16.5  HCT 48.4 51.0 48.7 49.7 49.8  MCV 91.5  --  91.4 92.6 91.2  PLT 310  --  281 282 311   Basic Metabolic Panel: Recent Labs  Lab 07/02/17 1440 07/02/17 1448 07/03/17 0759 07/04/17 0250 07/05/17 0257  NA 137 140 137 136 137  K 4.6 4.4 3.8 4.2 3.8  CL 106 106 105 102 101  CO2 19*  --  23 23 24   GLUCOSE 140* 153* 143* 113* 107*  BUN 12 13 7 9 12   CREATININE 1.14 1.00 0.85 1.01 0.91  CALCIUM 9.6  --  9.4 9.4 9.5   GFR: Estimated Creatinine Clearance: 110.4 mL/min (by C-G formula based on SCr of 0.91 mg/dL). Liver Function Tests: Recent Labs  Lab 07/02/17 1440  AST 32  ALT 34  ALKPHOS 83  BILITOT 0.6  PROT 7.9  ALBUMIN  4.4   No results for input(s): LIPASE, AMYLASE in the last 168 hours. No results for input(s): AMMONIA in the last 168 hours. Coagulation Profile: Recent Labs  Lab 07/02/17 1440  INR 0.96   Cardiac Enzymes: No results for input(s): CKTOTAL, CKMB, CKMBINDEX, TROPONINI in the last 168 hours. BNP (last 3 results) No results for input(s): PROBNP in the last 8760 hours. HbA1C: Recent Labs    07/03/17 0233  HGBA1C 6.0*   CBG: Recent Labs  Lab 07/02/17 1440 07/04/17 2103 07/05/17 0647  GLUCAP 147* 113* 115*   Lipid Profile: Recent Labs    07/03/17 0233  CHOL 303*  HDL 31*  LDLCALC UNABLE TO CALCULATE IF TRIGLYCERIDE OVER 400 mg/dL  TRIG [id number]*  CHOLHDL 9.8   Thyroid Function Tests: No results for input(s): TSH, T4TOTAL, FREET4, T3FREE, THYROIDAB in the last 72 hours. Anemia Panel: No results for input(s): VITAMINB12, FOLATE, FERRITIN, TIBC, IRON, RETICCTPCT in the last 72 hours. Urine analysis:    Component Value Date/Time  COLORURINE YELLOW 07/02/2017 1758   APPEARANCEUR CLEAR 07/02/2017 1758   LABSPEC 1.016 07/02/2017 1758   PHURINE 5.0 07/02/2017 1758   GLUCOSEU NEGATIVE 07/02/2017 1758   HGBUR NEGATIVE 07/02/2017 1758   BILIRUBINUR NEGATIVE 07/02/2017 1758   KETONESUR NEGATIVE 07/02/2017 1758   PROTEINUR NEGATIVE 07/02/2017 1758   NITRITE NEGATIVE 07/02/2017 1758   LEUKOCYTESUR NEGATIVE 07/02/2017 1758   Sepsis Labs: @LABRCNTIP (procalcitonin:4,lacticidven:4)  ) Recent Results (from the past 240 hour(s))  Surgical pcr screen     Status: None   Collection Time: 07/05/17  8:07 AM  Result Value Ref Range Status   MRSA, PCR NEGATIVE NEGATIVE Final   Staphylococcus aureus NEGATIVE NEGATIVE Final    Comment: (NOTE) The Xpert SA Assay (FDA approved for NASAL specimens in patients 38 years of age and older), is one component of a comprehensive surveillance program. It is not intended to diagnose infection nor to guide or monitor treatment.        Studies: No results found.  Scheduled Meds: . [MAR Hold]  stroke: mapping our early stages of recovery book   Does not apply Once  . [MAR Hold] aspirin  300 mg Rectal Daily   Or  . [MAR Hold] aspirin  325 mg Oral Daily  . [MAR Hold] atorvastatin  80 mg Oral q1800  . [MAR Hold] enoxaparin (LOVENOX) injection  40 mg Subcutaneous Q24H  . [MAR Hold] fenofibrate  160 mg Oral Daily  . [MAR Hold] folic acid  1 mg Oral Daily  . [MAR Hold] multivitamin with minerals  1 tablet Oral Daily  . [MAR Hold] nicotine  14 mg Transdermal Daily  . sodium chloride flush  3 mL Intravenous Q12H  . [MAR Hold] thiamine  100 mg Oral Daily    Continuous Infusions: . sodium chloride    . sodium chloride 10 mL/hr at 07/05/17 0500  . sodium chloride       LOS: 3 days     Penny Pia, MD [facility]   If 7PM-7AM, please contact night-coverage [email or web address] Password [email or web address] 07/05/2017, 4:12 PM

## 2017-07-05 NOTE — Interval H&P Note (Signed)
History and Physical Interval Note:  07/05/2017 12:35 PM  Angel CooperJames D Ziolkowski  has presented today for surgery, with the diagnosis of hf  The various methods of treatment have been discussed with the patient and family. After consideration of risks, benefits and other options for treatment, the patient has consented to  Procedure(s): RIGHT/LEFT HEART CATH AND CORONARY ANGIOGRAPHY (N/A) and possible coronary angioplasty as a surgical intervention .  The patient's history has been reviewed, patient examined, no change in status, stable for surgery.  I have reviewed the patient's chart and labs.  Questions were answered to the patient's satisfaction.     Gwendolyne Welford

## 2017-07-05 NOTE — Progress Notes (Signed)
PT Cancellation Note  Patient Details Name: Angel CooperJames D Fukushima MRN: [id number] DOB: 10/13/1970   Cancelled Treatment:    Reason Eval/Treat Not Completed: Patient at procedure or test/unavailable(heart cath)   [facility] [place] LPWILLIAMS,Ciera Beckum 07/05/2017, 1:37 PM

## 2017-07-05 NOTE — Progress Notes (Signed)
I met with pt and his wife at bedside to make them aware that I have insurance approval for an inpt rehab admission pending medical workup completion. Note for cardiac cath today at 12 noon. I have notified Dr. Wendee Beavers, RN CM and SW. Likely admit Friday per Dr. Wendee Beavers. [phone]

## 2017-07-05 NOTE — Progress Notes (Signed)
Advanced Heart Failure Rounding Note  Primary Cardiologist: New (Dr. Gala RomneyBensimhon)  Subjective:    No complaints this am. Denies CP, SOB, lightheadedness or dizziness. Slightly anxious about cath, but no further questions.   Creatinine stable. Hgb 16.5.   Objective:   Weight Range: 199 lb 4.7 oz (90.4 kg) Body mass index is 29.43 kg/m.   Vital Signs:   Temp:  [98.2 F (36.8 C)-98.7 F (37.1 C)] 98.7 F (37.1 C) (12/06 0500) Pulse Rate:  [76-98] 87 (12/06 0500) Resp:  [18] 18 (12/06 0500) BP: (110-129)/(65-84) 117/68 (12/06 0500) SpO2:  [96 %-98 %] 97 % (12/06 0500) Weight:  [199 lb 4.7 oz (90.4 kg)] 199 lb 4.7 oz (90.4 kg) (12/06 0500) Last BM Date: 07/02/17  Weight change: Filed Weights   07/02/17 1455 07/05/17 0500  Weight: 193 lb (87.5 kg) 199 lb 4.7 oz (90.4 kg)    Intake/Output:   Intake/Output Summary (Last 24 hours) at 07/05/2017 0947 Last data filed at 07/05/2017 0600 Gross per 24 hour  Intake 20 ml  Output 400 ml  Net -380 ml      Physical Exam    General:  Fatigued. NAD. Slurred Speech.  HEENT: Normal Neck: Supple. JVP does not appear elevated. Carotids 2+ bilat; no bruits. No lymphadenopathy or thyromegaly appreciated. Cor: PMI nondisplaced. Regular rate & rhythm. No rubs, gallops or murmurs. Lungs: Clear Abdomen: Soft, nontender, nondistended. No hepatosplenomegaly. No bruits or masses. Good bowel sounds. Extremities: No cyanosis, clubbing, rash, edema Neuro: Alert & orientedx3, cranial nerves grossly intact. ULE/LLE weakness. Mild L facial droop. Affect flat.   Telemetry   NSR, personally reviewed.   EKG    No new tracings.   Labs    CBC Recent Labs    07/04/17 0250 07/05/17 0257  WBC 11.4* 11.6*  NEUTROABS 4.3 4.9  HGB 16.4 16.5  HCT 49.7 49.8  MCV 92.6 91.2  PLT 282 311   Basic Metabolic Panel Recent Labs    02/72/5310/12/16 0250 07/05/17 0257  NA 136 137  K 4.2 3.8  CL 102 101  CO2 23 24  GLUCOSE 113* 107*  BUN 9 12    CREATININE 1.01 0.91  CALCIUM 9.4 9.5   Liver Function Tests Recent Labs    07/02/17 1440  AST 32  ALT 34  ALKPHOS 83  BILITOT 0.6  PROT 7.9  ALBUMIN 4.4   No results for input(s): LIPASE, AMYLASE in the last 72 hours. Cardiac Enzymes No results for input(s): CKTOTAL, CKMB, CKMBINDEX, TROPONINI in the last 72 hours.  BNP: BNP (last 3 results) No results for input(s): BNP in the last 8760 hours.  ProBNP (last 3 results) No results for input(s): PROBNP in the last 8760 hours.   D-Dimer No results for input(s): DDIMER in the last 72 hours. Hemoglobin A1C Recent Labs    07/03/17 0233  HGBA1C 6.0*   Fasting Lipid Panel Recent Labs    07/03/17 0233  CHOL 303*  HDL 31*  LDLCALC UNABLE TO CALCULATE IF TRIGLYCERIDE OVER 400 mg/dL  TRIG [id number]*  CHOLHDL 9.8   Thyroid Function Tests No results for input(s): TSH, T4TOTAL, T3FREE, THYROIDAB in the last 72 hours.  Invalid input(s): FREET3  Other results:   Imaging     No results found.   Medications:     Scheduled Medications: .  stroke: mapping our early stages of recovery book   Does not apply Once  . aspirin  300 mg Rectal Daily   Or  . aspirin  325 mg Oral Daily  . atorvastatin  80 mg Oral q1800  . enoxaparin (LOVENOX) injection  40 mg Subcutaneous Q24H  . fenofibrate  160 mg Oral Daily  . folic acid  1 mg Oral Daily  . multivitamin with minerals  1 tablet Oral Daily  . nicotine  14 mg Transdermal Daily  . sodium chloride flush  3 mL Intravenous Q12H  . thiamine  100 mg Oral Daily     Infusions: . sodium chloride    . sodium chloride 10 mL/hr at 07/05/17 0500     PRN Medications:  sodium chloride, acetaminophen **OR** acetaminophen (TYLENOL) oral liquid 160 mg/5 mL **OR** acetaminophen, clonazePAM, hydrALAZINE, LORazepam **OR** LORazepam, sodium chloride flush, traZODone    Patient Profile   Angel Davies is a 46 y.o. male with PMH of HTN, ADHD, obesity, anxiety, and depression.    Presented 07/02/17 with CVA. Found to have systolic CHF by echo.  UDS notably + for cocaine.  Assessment/Plan   1. 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 - With EF < 30%, will need anticoagulation moving forward. Suspect cardioembolic in etiology. - Plan for CIR after cath.   2. Acute systolic CHF - Echo 07/02/17 with LVEF 20-25%, grade 2 DD, Trivial MR, Trivial TR - Unclear etiology, suspect CAD based on Echo with WMAs and apex hypokinesis. ? Component from substance abuse as well. - BPs remains in 100-110s.  - Plan L/RHC this afternoon.  - Will need repeat echo 3 months after med optimization.   3. HTN - Will adjust meds in setting of treating his HF. Will not be overly aggressive with recent CVA.  - Will await cath to decide best meds to start, and letting BP ride for with recent CVA.   4. Cocaine abuse - Stressed importance of complete cessation. No change.   5. Alcohol abuse - Stressed importance of complete cessation. No change.   6. HLD - Started on lipitor and fenofibrate. No change.   7. Hypertriglyceridemia - Now on Lipitor and Fenofibrate. No change.   Medication concerns reviewed with patient and pharmacy team. Barriers identified: None at this time.   Length of Stay: 3  Angel SchoolMichael Andrew Tillery, PA-C  07/05/2017, 9:47 AM  Advanced Heart Failure Team Pager [phone] (M-F; 7a - 4p)  Please contact [facility] for night-coverage after hours (4p -7a ) and weekends on [email or web address]  Patient seen and examined with the above-signed Advanced Practice Provider and/or Housestaff. I personally reviewed laboratory data, imaging studies and relevant notes. I independently examined the patient and formulated the important aspects of the plan. I have edited the note to reflect any of my changes or salient points. I have personally discussed the plan with the patient  and/or family.  Feels ok. Denies CP or SOB. Vitals stable. Still weak on left side. Planning cath today. Procedure discussed.   [name], MD  12:34 PM

## 2017-07-05 NOTE — Progress Notes (Signed)
Occupational Therapy Treatment Patient Details Name: Angel Davies MRN: [id number] DOB: 08/30/1970 Today's Date: 07/05/2017    History of present illness 46 y.o. male with medical history significant for ADHD, obesity, anxiety with depression. Presenting with L-sided weakness/numbness, left facial droop, and slurred speech. MRI showing 2.3 x 1.2 cm reduced diffusion RIGHT pons and faint reduced diffusion RIGHT temporal occipital lobe.   OT comments  Pt making good progress toward OT goals; requires mod HHA assist for functional mobility and grooming task standing at the sink. Pt able to complete LUE AAROM exercises x10 each. Encouraged functional use of LUE and purposeful movement/exercise as pt is compensating for LUE impairments with entire body. D/c plan remains appropriate. Will continue to follow acutely.   Follow Up Recommendations  CIR    Equipment Recommendations  Other (comment)(TBD at next venue)    Recommendations for Other Services      Precautions / Restrictions Precautions Precautions: Fall Restrictions Weight Bearing Restrictions: No       Mobility Bed Mobility Overal bed mobility: Needs Assistance Bed Mobility: Supine to Sit   Sidelying to sit: Min assist       General bed mobility comments: Assist for trunk elevation to sitting due to poor balance.  Transfers Overall transfer level: Needs assistance Equipment used: None Transfers: Sit to/from Stand Sit to Stand: Min assist         General transfer comment: Min assist for balance and safety    Balance Overall balance assessment: Needs assistance Sitting-balance support: No upper extremity supported;Feet supported Sitting balance-Leahy Scale: Fair     Standing balance support: Single extremity supported Standing balance-Leahy Scale: Poor Standing balance comment: min-mod assist for standing balance                           ADL either performed or assessed with clinical judgement    ADL Overall ADL's : Needs assistance/impaired     Grooming: Moderate assistance;Standing;Wash/dry hands Grooming Details (indicate cue type and reason): for standing balance             Lower Body Dressing: Moderate assistance Lower Body Dressing Details (indicate cue type and reason): to don socks in sitting Toilet Transfer: Moderate assistance;Ambulation(HHA and gait belt) Toilet Transfer Details (indicate cue type and reason): Simulated with functional mobility in room         Functional mobility during ADLs: Moderate assistance(HHA) General ADL Comments: Encouraged functional use of LUE and purposeful movement/exercises     Vision       Perception     Praxis      Cognition Arousal/Alertness: Awake/alert Behavior During Therapy: WFL for tasks assessed/performed Overall Cognitive Status: Within Functional Limits for tasks assessed                                          Exercises Exercises: General Upper Extremity;Other exercises General Exercises - Upper Extremity Shoulder Flexion: AAROM;Left;10 reps;Seated Shoulder Extension: AAROM;Left;10 reps;Seated Elbow Flexion: AAROM;Left;10 reps;Seated Elbow Extension: AAROM;Left;10 reps;Seated Digit Composite Flexion: AROM;Left;5 reps;Seated Composite Extension: AROM;Left;5 reps;Seated Other Exercises Other Exercises: bil shoulder shrugs x10   Shoulder Instructions       General Comments      Pertinent Vitals/ Pain       Pain Assessment: No/denies pain  Home Living  Prior Functioning/Environment              Frequency  Min 2X/week        Progress Toward Goals  OT Goals(current goals can now be found in the care plan section)  Progress towards OT goals: Progressing toward goals  Acute Rehab OT Goals Patient Stated Goal: return to walking OT Goal Formulation: With patient ADL Goals Pt Will Perform Grooming: with  supervision;standing Pt Will Perform Upper Body Bathing: with supervision;sitting Pt Will Perform Lower Body Bathing: with min assist;sit to/from stand Pt Will Transfer to Toilet: with min assist;ambulating;bedside commode Pt Will Perform Toileting - Clothing Manipulation and hygiene: with min assist;sit to/from stand  Plan Discharge plan remains appropriate    Co-evaluation                 AM-PAC PT "6 Clicks" Daily Activity     Outcome Measure   Help from another person eating meals?: A Little Help from another person taking care of personal grooming?: A Lot Help from another person toileting, which includes using toliet, bedpan, or urinal?: A Lot Help from another person bathing (including washing, rinsing, drying)?: A Lot Help from another person to put on and taking off regular upper body clothing?: A Little Help from another person to put on and taking off regular lower body clothing?: A Lot 6 Click Score: 14    End of Session Equipment Utilized During Treatment: Gait belt  OT Visit Diagnosis: Unsteadiness on feet (R26.81);Other abnormalities of gait and mobility (R26.89);Muscle weakness (generalized) (M62.81);Hemiplegia and hemiparesis Hemiplegia - Right/Left: Left Hemiplegia - dominant/non-dominant: Non-Dominant Hemiplegia - caused by: Cerebral infarction   Activity Tolerance Patient tolerated treatment well   Patient Left in chair;with call bell/phone within reach   Nurse Communication          Time: [id number] OT Time Calculation (min): 24 min  Charges: OT General Charges $OT Visit: 1 Visit OT Treatments $Self Care/Home Management : 8-22 mins $Therapeutic Exercise: 8-22 mins  Angel Davies A. Angel Davies, M.S., OTR/L Pager: [phone]   Angel AlkenBailey A Aviyana Davies 07/05/2017, 10:59 AM

## 2017-07-05 NOTE — Care Management Note (Signed)
Case Management Note  Patient Details  Name: Claretha CooperJames D Liera MRN: [id number] Date of Birth: 12/23/1970  Subjective/Objective:     Pt admitted for CVA. He is from home with his spouse. CM consulted for PCP.               Action/Plan: Plan is CIR once obtain authorization from insurance. Patient going for cardiac catheterization today. CM will follow patient and if he discharges to CIR they will arrange PCP at d/c. If he does not d/c to CIR CM will arrange a PCP prior to d/c. CM following.  Expected Discharge Date:                  Expected Discharge Plan:     In-House Referral:     Discharge planning Services     Post Acute Care Choice:    Choice offered to:     DME Arranged:    DME Agency:     HH Arranged:    HH Agency:     Status of Service:     If discussed at [employer] Meetings, dates discussed:    Additional Comments:  Kermit BaloKelli F Quy Lotts, RN 07/05/2017, 8:17 AM

## 2017-07-06 ENCOUNTER — Inpatient Hospital Stay (HOSPITAL_COMMUNITY)
Admission: RE | Admit: 2017-07-06 | Discharge: 2017-07-27 | DRG: 056 | Disposition: A | Discharge status: Home / Self Care | Payer: Managed Care, Other (non HMO) | Source: Intra-hospital | Attending: Physical Medicine & Rehabilitation | Admitting: Physical Medicine & Rehabilitation

## 2017-07-06 ENCOUNTER — Other Ambulatory Visit: Payer: Self-pay

## 2017-07-06 ENCOUNTER — Encounter (HOSPITAL_COMMUNITY): Payer: Self-pay | Admitting: Internal Medicine

## 2017-07-06 ENCOUNTER — Inpatient Hospital Stay (HOSPITAL_COMMUNITY): Payer: Managed Care, Other (non HMO)

## 2017-07-06 DIAGNOSIS — F1010 Alcohol abuse, uncomplicated: Secondary | ICD-10-CM | POA: Diagnosis present

## 2017-07-06 DIAGNOSIS — I69393 Ataxia following cerebral infarction: Secondary | ICD-10-CM

## 2017-07-06 DIAGNOSIS — I255 Ischemic cardiomyopathy: Secondary | ICD-10-CM | POA: Diagnosis present

## 2017-07-06 DIAGNOSIS — F419 Anxiety disorder, unspecified: Secondary | ICD-10-CM | POA: Diagnosis present

## 2017-07-06 DIAGNOSIS — Z7151 Drug abuse counseling and surveillance of drug abuser: Secondary | ICD-10-CM

## 2017-07-06 DIAGNOSIS — D72829 Elevated white blood cell count, unspecified: Secondary | ICD-10-CM | POA: Diagnosis present

## 2017-07-06 DIAGNOSIS — G4700 Insomnia, unspecified: Secondary | ICD-10-CM | POA: Diagnosis present

## 2017-07-06 DIAGNOSIS — K649 Unspecified hemorrhoids: Secondary | ICD-10-CM | POA: Diagnosis present

## 2017-07-06 DIAGNOSIS — F1410 Cocaine abuse, uncomplicated: Secondary | ICD-10-CM | POA: Diagnosis present

## 2017-07-06 DIAGNOSIS — G8194 Hemiplegia, unspecified affecting left nondominant side: Secondary | ICD-10-CM | POA: Diagnosis not present

## 2017-07-06 DIAGNOSIS — E785 Hyperlipidemia, unspecified: Secondary | ICD-10-CM | POA: Diagnosis present

## 2017-07-06 DIAGNOSIS — Z823 Family history of stroke: Secondary | ICD-10-CM | POA: Diagnosis not present

## 2017-07-06 DIAGNOSIS — R7303 Prediabetes: Secondary | ICD-10-CM | POA: Diagnosis present

## 2017-07-06 DIAGNOSIS — F1210 Cannabis abuse, uncomplicated: Secondary | ICD-10-CM | POA: Diagnosis present

## 2017-07-06 DIAGNOSIS — I236 Thrombosis of atrium, auricular appendage, and ventricle as current complications following acute myocardial infarction: Secondary | ICD-10-CM | POA: Diagnosis not present

## 2017-07-06 DIAGNOSIS — I6350 Cerebral infarction due to unspecified occlusion or stenosis of unspecified cerebral artery: Secondary | ICD-10-CM | POA: Diagnosis present

## 2017-07-06 DIAGNOSIS — F329 Major depressive disorder, single episode, unspecified: Secondary | ICD-10-CM | POA: Diagnosis present

## 2017-07-06 DIAGNOSIS — M791 Myalgia, unspecified site: Secondary | ICD-10-CM

## 2017-07-06 DIAGNOSIS — I5023 Acute on chronic systolic (congestive) heart failure: Secondary | ICD-10-CM | POA: Diagnosis present

## 2017-07-06 DIAGNOSIS — I69354 Hemiplegia and hemiparesis following cerebral infarction affecting left non-dominant side: Principal | ICD-10-CM

## 2017-07-06 DIAGNOSIS — I2510 Atherosclerotic heart disease of native coronary artery without angina pectoris: Secondary | ICD-10-CM | POA: Diagnosis present

## 2017-07-06 DIAGNOSIS — I69398 Other sequelae of cerebral infarction: Secondary | ICD-10-CM | POA: Diagnosis not present

## 2017-07-06 DIAGNOSIS — F909 Attention-deficit hyperactivity disorder, unspecified type: Secondary | ICD-10-CM | POA: Diagnosis present

## 2017-07-06 DIAGNOSIS — Z79899 Other long term (current) drug therapy: Secondary | ICD-10-CM | POA: Diagnosis not present

## 2017-07-06 DIAGNOSIS — Z960 Presence of urogenital implants: Secondary | ICD-10-CM | POA: Diagnosis present

## 2017-07-06 DIAGNOSIS — Z716 Tobacco abuse counseling: Secondary | ICD-10-CM | POA: Diagnosis not present

## 2017-07-06 DIAGNOSIS — I513 Intracardiac thrombosis, not elsewhere classified: Secondary | ICD-10-CM | POA: Diagnosis present

## 2017-07-06 DIAGNOSIS — F17210 Nicotine dependence, cigarettes, uncomplicated: Secondary | ICD-10-CM | POA: Diagnosis present

## 2017-07-06 LAB — POCT I-STAT 3, ART BLOOD GAS (G3+)
Acid-base deficit: 3 mmol/L — ABNORMAL HIGH (ref 0.0–2.0)
BICARBONATE: 20.7 mmol/L (ref 20.0–28.0)
O2 Saturation: 95 %
PH ART: 7.408 (ref 7.350–7.450)
PO2 ART: 72 mmHg — AB (ref 83.0–108.0)
TCO2: 22 mmol/L (ref 22–32)
pCO2 arterial: 32.9 mmHg (ref 32.0–48.0)

## 2017-07-06 LAB — CBC WITH DIFFERENTIAL/PLATELET
BASOS ABS: 0 10*3/uL (ref 0.0–0.1)
Basophils Relative: 0 %
EOS ABS: 0.3 10*3/uL (ref 0.0–0.7)
Eosinophils Relative: 2 %
HCT: 49 % (ref 39.0–52.0)
HEMOGLOBIN: 16.4 g/dL (ref 13.0–17.0)
LYMPHS PCT: 49 %
Lymphs Abs: 6.2 10*3/uL — ABNORMAL HIGH (ref 0.7–4.0)
MCH: 30.8 pg (ref 26.0–34.0)
MCHC: 33.5 g/dL (ref 30.0–36.0)
MCV: 92.1 fL (ref 78.0–100.0)
MONO ABS: 1.1 10*3/uL — AB (ref 0.1–1.0)
Monocytes Relative: 9 %
Neutro Abs: 5 10*3/uL (ref 1.7–7.7)
Neutrophils Relative %: 40 %
PLATELETS: 300 10*3/uL (ref 150–400)
RBC: 5.32 MIL/uL (ref 4.22–5.81)
RDW: 13.7 % (ref 11.5–15.5)
WBC: 12.6 10*3/uL — ABNORMAL HIGH (ref 4.0–10.5)

## 2017-07-06 LAB — BASIC METABOLIC PANEL
ANION GAP: 10 (ref 5–15)
BUN: 13 mg/dL (ref 6–20)
CALCIUM: 9.6 mg/dL (ref 8.9–10.3)
CO2: 22 mmol/L (ref 22–32)
CREATININE: 0.94 mg/dL (ref 0.61–1.24)
Chloride: 106 mmol/L (ref 101–111)
Glucose, Bld: 103 mg/dL — ABNORMAL HIGH (ref 65–99)
Potassium: 4.1 mmol/L (ref 3.5–5.1)
SODIUM: 138 mmol/L (ref 135–145)

## 2017-07-06 LAB — PATHOLOGIST SMEAR REVIEW: Path Review: REACTIVE

## 2017-07-06 MED ORDER — ASPIRIN 81 MG PO CHEW
243.0000 mg | CHEWABLE_TABLET | Freq: Once | ORAL | Status: AC
  Administered 2017-07-06: 243 mg via ORAL
  Filled 2017-07-06: qty 3

## 2017-07-06 MED ORDER — HEPARIN (PORCINE) IN NACL 100-0.45 UNIT/ML-% IJ SOLN
1500.0000 [IU]/h | INTRAMUSCULAR | Status: DC
Start: 2017-07-06 — End: 2017-07-08
  Administered 2017-07-06: 1300 [IU]/h via INTRAVENOUS
  Administered 2017-07-07: 1400 [IU]/h via INTRAVENOUS
  Filled 2017-07-06 (×3): qty 250

## 2017-07-06 MED ORDER — GADOBENATE DIMEGLUMINE 529 MG/ML IV SOLN
30.0000 mL | Freq: Once | INTRAVENOUS | Status: AC | PRN
  Administered 2017-07-06: 30 mL via INTRAVENOUS

## 2017-07-06 MED ORDER — SPIRONOLACTONE 12.5 MG HALF TABLET
12.5000 mg | ORAL_TABLET | Freq: Every day | ORAL | Status: DC
  Administered 2017-07-06 – 2017-07-26 (×21): 12.5 mg via ORAL
  Filled 2017-07-06 (×21): qty 1

## 2017-07-06 MED ORDER — VITAMIN B-1 100 MG PO TABS
100.0000 mg | ORAL_TABLET | Freq: Every day | ORAL | Status: DC
  Administered 2017-07-07 – 2017-07-27 (×21): 100 mg via ORAL
  Filled 2017-07-06 (×21): qty 1

## 2017-07-06 MED ORDER — DIGOXIN 125 MCG PO TABS
0.1250 mg | ORAL_TABLET | Freq: Every day | ORAL | Status: DC
  Administered 2017-07-07 – 2017-07-27 (×21): 0.125 mg via ORAL
  Filled 2017-07-06 (×21): qty 1

## 2017-07-06 MED ORDER — HEPARIN SODIUM (PORCINE) 5000 UNIT/ML IJ SOLN: 5000.0000 [IU] | Freq: Three times a day (TID) | INTRAMUSCULAR | Status: DC

## 2017-07-06 MED ORDER — SPIRONOLACTONE 25 MG PO TABS: 12.5000 mg | ORAL_TABLET | Freq: Every day | ORAL | 0 refills | Status: DC

## 2017-07-06 MED ORDER — COUMADIN BOOK
Freq: Once | Status: AC
  Administered 2017-07-06: 14:00:00
  Filled 2017-07-06: qty 1

## 2017-07-06 MED ORDER — ASPIRIN 81 MG PO CHEW: 81.0000 mg | CHEWABLE_TABLET | Freq: Every day | ORAL | Status: DC

## 2017-07-06 MED ORDER — ASPIRIN 325 MG PO TABS: 325.0000 mg | ORAL_TABLET | Freq: Every day | ORAL | Status: DC

## 2017-07-06 MED ORDER — ADULT MULTIVITAMIN W/MINERALS CH
1.0000 | ORAL_TABLET | Freq: Every day | ORAL | Status: DC
  Administered 2017-07-07 – 2017-07-27 (×21): 1 via ORAL
  Filled 2017-07-06 (×21): qty 1

## 2017-07-06 MED ORDER — WARFARIN SODIUM 5 MG PO TABS: 10.0000 mg | ORAL_TABLET | Freq: Once | ORAL | Status: AC

## 2017-07-06 MED ORDER — FENOFIBRATE 160 MG PO TABS: 160.0000 mg | ORAL_TABLET | Freq: Every day | ORAL | 0 refills | Status: DC

## 2017-07-06 MED ORDER — WARFARIN VIDEO
Freq: Once | Status: AC
  Administered 2017-07-06: 14:00:00

## 2017-07-06 MED ORDER — ATORVASTATIN CALCIUM 80 MG PO TABS: 80.0000 mg | ORAL_TABLET | Freq: Every day | ORAL | 0 refills | Status: DC

## 2017-07-06 MED ORDER — ACETAMINOPHEN 325 MG PO TABS
325.0000 mg | ORAL_TABLET | ORAL | Status: DC | PRN
  Administered 2017-07-17 – 2017-07-24 (×7): 650 mg via ORAL
  Filled 2017-07-06 (×7): qty 2

## 2017-07-06 MED ORDER — DIPHENHYDRAMINE HCL 12.5 MG/5ML PO ELIX: 12.5000 mg | ORAL_SOLUTION | Freq: Four times a day (QID) | ORAL | Status: DC | PRN

## 2017-07-06 MED ORDER — PROCHLORPERAZINE EDISYLATE 5 MG/ML IJ SOLN: 5.0000 mg | Freq: Four times a day (QID) | INTRAMUSCULAR | Status: DC | PRN

## 2017-07-06 MED ORDER — ASPIRIN 81 MG PO CHEW: 81.0000 mg | CHEWABLE_TABLET | Freq: Every day | ORAL | 0 refills | Status: DC

## 2017-07-06 MED ORDER — DIGOXIN 125 MCG PO TABS: 0.1250 mg | ORAL_TABLET | Freq: Every day | ORAL | 0 refills | Status: DC

## 2017-07-06 MED ORDER — WARFARIN - PHARMACIST DOSING INPATIENT
Freq: Every day | Status: DC
  Administered 2017-07-07 – 2017-07-24 (×6)
  Administered 2017-07-25: 1
  Administered 2017-07-26: 18:00:00

## 2017-07-06 MED ORDER — SPIRONOLACTONE 12.5 MG HALF TABLET
12.5000 mg | ORAL_TABLET | Freq: Every day | ORAL | Status: DC
  Filled 2017-07-06: qty 1

## 2017-07-06 MED ORDER — POLYETHYLENE GLYCOL 3350 17 G PO PACK: 17.0000 g | PACK | Freq: Every day | ORAL | Status: DC | PRN

## 2017-07-06 MED ORDER — GUAIFENESIN-DM 100-10 MG/5ML PO SYRP: 5.0000 mL | ORAL_SOLUTION | Freq: Four times a day (QID) | ORAL | Status: DC | PRN

## 2017-07-06 MED ORDER — PROCHLORPERAZINE MALEATE 5 MG PO TABS: 5.0000 mg | ORAL_TABLET | Freq: Four times a day (QID) | ORAL | Status: DC | PRN

## 2017-07-06 MED ORDER — TRAZODONE HCL 50 MG PO TABS
50.0000 mg | ORAL_TABLET | Freq: Every evening | ORAL | Status: DC | PRN
  Administered 2017-07-08 – 2017-07-13 (×6): 50 mg via ORAL
  Administered 2017-07-14 – 2017-07-26 (×12): 100 mg via ORAL
  Filled 2017-07-06 (×3): qty 2
  Filled 2017-07-06: qty 1
  Filled 2017-07-06 (×4): qty 2
  Filled 2017-07-06 (×2): qty 1
  Filled 2017-07-06 (×3): qty 2
  Filled 2017-07-06: qty 1
  Filled 2017-07-06 (×4): qty 2
  Filled 2017-07-06: qty 1

## 2017-07-06 MED ORDER — WARFARIN - PHARMACIST DOSING INPATIENT: Freq: Every day | Status: DC

## 2017-07-06 MED ORDER — BISACODYL 10 MG RE SUPP: 10.0000 mg | Freq: Every day | RECTAL | Status: DC | PRN

## 2017-07-06 MED ORDER — PROCHLORPERAZINE 25 MG RE SUPP: 12.5000 mg | Freq: Four times a day (QID) | RECTAL | Status: DC | PRN

## 2017-07-06 MED ORDER — WARFARIN SODIUM 10 MG PO TABS: 10.0000 mg | ORAL_TABLET | Freq: Once | ORAL | Status: DC

## 2017-07-06 MED ORDER — FENOFIBRATE 160 MG PO TABS
160.0000 mg | ORAL_TABLET | Freq: Every day | ORAL | Status: DC
  Administered 2017-07-07 – 2017-07-27 (×21): 160 mg via ORAL
  Filled 2017-07-06 (×21): qty 1

## 2017-07-06 MED ORDER — FOLIC ACID 1 MG PO TABS
1.0000 mg | ORAL_TABLET | Freq: Every day | ORAL | Status: DC
  Administered 2017-07-07 – 2017-07-27 (×21): 1 mg via ORAL
  Filled 2017-07-06 (×20): qty 1

## 2017-07-06 MED ORDER — WARFARIN SODIUM 5 MG PO TABS
10.0000 mg | ORAL_TABLET | Freq: Once | ORAL | Status: AC
  Administered 2017-07-06: 10 mg via ORAL
  Filled 2017-07-06: qty 2

## 2017-07-06 MED ORDER — FLEET ENEMA 7-19 GM/118ML RE ENEM: 1.0000 | ENEMA | Freq: Once | RECTAL | Status: DC | PRN

## 2017-07-06 MED ORDER — ALUM & MAG HYDROXIDE-SIMETH 200-200-20 MG/5ML PO SUSP: 30.0000 mL | ORAL | Status: DC | PRN

## 2017-07-06 MED ORDER — NICOTINE 14 MG/24HR TD PT24
14.0000 mg | MEDICATED_PATCH | Freq: Every day | TRANSDERMAL | Status: DC
  Administered 2017-07-07 – 2017-07-27 (×21): 14 mg via TRANSDERMAL
  Filled 2017-07-06 (×21): qty 1

## 2017-07-06 MED ORDER — DIGOXIN 125 MCG PO TABS
0.1250 mg | ORAL_TABLET | Freq: Every day | ORAL | Status: DC
  Administered 2017-07-06: 0.125 mg via ORAL
  Filled 2017-07-06: qty 1

## 2017-07-06 MED ORDER — ATORVASTATIN CALCIUM 80 MG PO TABS
80.0000 mg | ORAL_TABLET | Freq: Every day | ORAL | Status: DC
  Administered 2017-07-06 – 2017-07-26 (×21): 80 mg via ORAL
  Filled 2017-07-06 (×21): qty 1

## 2017-07-06 NOTE — Progress Notes (Signed)
Advanced Heart Failure Rounding Note  Primary Cardiologist: New (Dr. Gala Romney)  Subjective:    Doing well this am. Denies CP or SOB. Denies dizziness or lightheadedness.  Creatinine stable. Hgb 16.4.  R/LHC 07/05/17  Mid RCA to Dist RCA lesion is 100% stenosed.  Dist Cx lesion is 100% stenosed.  Mid LAD lesion is 100% stenosed.  Prox Cx to Dist Cx lesion is 30% stenosed.   Findings:  Ao = 104/68 (84) LV = 110/9 RA = 1 RV = 19/3 PA = 19/9 (11) PCW = 5 Fick cardiac output/index = 4.3/2.1 PVR = 1.5 WU SVR = 2122 Ao sat = 96% PA sat = 64%, 66%  Assessment: 1. Severe CAD with left-dominant system and occluded mLAD 2. Ischemic CM with EF 25% by echo 3. Well compensated filling pressures with moderately reduced cardiac output  Objective:   Weight Range: 194 lb 0.1 oz (88 kg) Body mass index is 28.65 kg/m.   Vital Signs:   Temp:  [98 F (36.7 C)-98.5 F (36.9 C)] 98 F (36.7 C) (12/07 0631) Pulse Rate:  [61-101] 80 (12/07 0631) Resp:  [7-25] 23 (12/07 0631) BP: (96-145)/(62-96) 108/80 (12/07 0631) SpO2:  [90 %-100 %] 96 % (12/07 0631) Weight:  [194 lb 0.1 oz (88 kg)] 194 lb 0.1 oz (88 kg) (12/07 0631) Last BM Date: 07/02/17  Weight change: Filed Weights   07/02/17 1455 07/05/17 0500 07/06/17 0631  Weight: 193 lb (87.5 kg) 199 lb 4.7 oz (90.4 kg) 194 lb 0.1 oz (88 kg)    Intake/Output:   Intake/Output Summary (Last 24 hours) at 07/06/2017 1002 Last data filed at 07/06/2017 0938 Gross per 24 hour  Intake 726 ml  Output 925 ml  Net -199 ml      Physical Exam    General: Fatigued. NAD. Slurred speech.  HEENT: Normal. Mild L facial droop. Neck: Supple. JVP 5-6. Carotids 2+ bilat; no bruits. No thyromegaly or nodule noted. Cor: PMI nondisplaced. RRR, No M/G/R noted Lungs: CTAB, normal effort. Abdomen: Soft, non-tender, non-distended, no HSM. No bruits or masses. +BS  Extremities: No cyanosis, clubbing, or rash. R and LLE no edema.  Neuro:  Alert & orientedx3, cranial nerves grossly intact. Left sided weakness. Mild L facial droop. Affect flat.   Telemetry   NSR, personally reviewed.   EKG    No new tracings.   Labs    CBC Recent Labs    07/05/17 0257 07/06/17 0318  WBC 11.6* 12.6*  NEUTROABS 4.9 5.0  HGB 16.5 16.4  HCT 49.8 49.0  MCV 91.2 92.1  PLT 311 300   Basic Metabolic Panel Recent Labs    41/32/44 0257 07/06/17 0318  NA 137 138  K 3.8 4.1  CL 101 106  CO2 24 22  GLUCOSE 107* 103*  BUN 12 13  CREATININE 0.91 0.94  CALCIUM 9.5 9.6   Liver Function Tests No results for input(s): AST, ALT, ALKPHOS, BILITOT, PROT, ALBUMIN in the last 72 hours. No results for input(s): LIPASE, AMYLASE in the last 72 hours. Cardiac Enzymes No results for input(s): CKTOTAL, CKMB, CKMBINDEX, TROPONINI in the last 72 hours.  BNP: BNP (last 3 results) No results for input(s): BNP in the last 8760 hours.  ProBNP (last 3 results) No results for input(s): PROBNP in the last 8760 hours.   D-Dimer No results for input(s): DDIMER in the last 72 hours. Hemoglobin A1C No results for input(s): HGBA1C in the last 72 hours. Fasting Lipid Panel No results for input(s):  CHOL, HDL, LDLCALC, TRIG, CHOLHDL, LDLDIRECT in the last 72 hours. Thyroid Function Tests No results for input(s): TSH, T4TOTAL, T3FREE, THYROIDAB in the last 72 hours.  Invalid input(s): FREET3  Other results:   Imaging    No results found.   Medications:     Scheduled Medications: .  stroke: mapping our early stages of recovery book   Does not apply Once  . aspirin  81 mg Oral Daily  . atorvastatin  80 mg Oral q1800  . fenofibrate  160 mg Oral Daily  . folic acid  1 mg Oral Daily  . heparin  5,000 Units Subcutaneous Q8H  . multivitamin with minerals  1 tablet Oral Daily  . nicotine  14 mg Transdermal Daily  . sodium chloride flush  3 mL Intravenous Q12H  . thiamine  100 mg Oral Daily    Infusions: . sodium chloride      PRN  Medications: sodium chloride, acetaminophen **OR** acetaminophen (TYLENOL) oral liquid 160 mg/5 mL **OR** acetaminophen, clonazePAM, hydrALAZINE, ondansetron (ZOFRAN) IV, sodium chloride flush, traZODone    Patient Profile   Angel Davies is a 46 y.o. male with PMH of HTN, ADHD, obesity, anxiety, and depression.   Presented 07/02/17 with CVA. Found to have systolic CHF by echo.  UDS notably + for cocaine.  Assessment/Plan   1. 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 - With EF < 30%, will need anticoagulation moving forward. Suspect cardioembolic in etiology. - Plan for CIR after cath.   2. Acute systolic CHF due to ICM.  - Echo 07/02/17 with LVEF 20-25%, grade 2 DD, Trivial MR, Trivial TR - BPs ranging 90-110s.  - Add spiro 12.5 qhs and follow pressures closely.  - Will add losartan as pressures tolerate and consider Entresto.  - Add digoxin 0.125 mg daily.  - Will need repeat echo 3 months after med optimization for ICD consideration.  - cMRI pending with EF decrease out of proportion to CAD.   3. CAD - LHC 07/05/17 with severe CAD with left-dominant system and occluded mLAD.  - Continue ASA and statin as below.   4. HTN - Will adjust meds in setting of HF. Gently and will watch pressures closely with recent CVA.   5. Cocaine abuse - Stressed importance of complete cessation. No change.   6. Alcohol abuse - Stressed importance of complete cessation. No change.   7. HLD - Started on atorvastatin 80 mg daily.  - Continue  fenofibrate. No change.   OK for CIR. HF team will see again on Monday, or sooner as needed.   Medication concerns reviewed with patient and pharmacy team. Barriers identified: None at this time.   Length of Stay: [address]  Graciella FreerMichael Andrew Tillery, [place] JerseyPA-C  07/06/2017, 10:02 AM  Advanced Heart Failure Team Pager [phone] (M-F; 7a - 4p)  Please contact  [facility] for night-coverage after hours (4p -7a ) and weekends on [email or web address]   Patient seen and examined with the above-signed Advanced Practice Provider and/or Housestaff. I personally reviewed laboratory data, imaging studies and relevant notes. I independently examined the patient and formulated the important aspects of the plan. I have edited the note to reflect any of my changes or salient points. I have personally discussed the plan with the patient and/or family.  Feels good. No dyspnea or volume overload.   cMRI reviewed personally 1) Severe LVE with EF 18% septal and apical  akinesis diffuse hypokinesis 2) Apical thrombus measuring 11 mm x 8 mm 3) Full thickness scar involving the mid and distal septum and apex Subendocardial scar involving the basal inferior lateral wall  Will begin to titrate HF meds. Start heparin/coumadin for LV thrombus.   Arvilla Meresaniel Orie Cuttino, MD  6:26 PM

## 2017-07-06 NOTE — PMR Pre-admission (Signed)
PMR Admission Coordinator Pre-Admission Assessment  Patient: Angel CooperJames D Fudala is an 46 y.o., male MRN: [id number] DOB: 02/13/1971 Height: 5\' 9"  (175.3 cm) Weight: 88 kg (194 lb 0.1 oz)              Insurance Information HMO:     PPO:      PCP:      IPA:      80/20:      OTHER: Choice fund HRA open access plan PRIMARY: [employer]      Policy#: [id number]      Subscriber: pt CM Name: Dartha LodgeDonna Hiscock      Phone#: [phone] ext [phone]     Fax#: [phone] has full Epic access Pre-Cert#: [id number]      Approved 12/6 through 07/11/17 Benefits:  Phone #: [phone]     Name: 07/04/2017 Eff. Date: 02/28/2017     Deduct: $1500      Out of Pocket Max: $3500 includes deductible      Life Max: none CIR: 80%      SNF: 80% Outpatient: 80%     Co-Pay: visits per medical neccesity preauth only Home Health: 80%      Co-Pay: 120 visits combined. preauth not required DME: 80%     Co-Pay: thorough [employer] [phone] Providers: in network  SECONDARY: none        Medicaid Application Date:      Case Manager:  Disability Application Date:       Case Worker:   Emergency Contact Information Contact Information    Name Relation Home Work Mobile   CherrylandBranly,Cynthia Spouse   [phone]     Current Medical History  Patient Admitting Diagnosis: right CVA  History of Present Illness:  HPI: Angel CooperJames D Wyble is a 46 y.o. male with history of anxiety, depression/anxiety, fall 12/2016 with loss of peripheral vision and onset of HA, polysubstance abuse who was admitted on 07/02/17 with fall, slurred speech and left sided weakness. Patient reported that he was drinking ETOH and used cocaine the night before--UDS positive for cocaine and THC. CT head negative. MRI/MRA brain done revealing " Acute RIGHT pontine infarct, Subacute small RIGHT temporal occipital lobe/PCA territory  Infarct and severe stenosis distal right P2 segment.  CTA head/neck was negative for emergent vascular finding, 40% left SA stenosis due to plaque  and emphysema.  2D echo showed EF 20-25% with moderately dilated LV, akinesis of anterior and apical myocardium with grade 2 diastolic dysfunction.  Cardiology consulted due to low EF and Cardiac catherization 12/6/ revealed [facility] 07/05[facility]17  Mid RCA to Dist RCA lesion is 100% stenosed.  Dist Cx lesion is 100% stenosed.  Mid LAD lesion is 100% stenosed.  Prox Cx to Dist Cx lesion is 30% stenosed.  Severe CAD with left dominant system and occluded mLAD, ischemic CM with EF 25 % by echo as well as well compensated filling pressures with moderatley reduced cardiac output. Plan - Add spiro 12.5 qhs and follow pressures closely.  - Will add losartan as pressures tolerate and consider Entresto.  - Add digoxin 0.125 mg daily.  - Will need repeat echo 3 months after med optimization for ICD consideration.  - cMRI pending with EF decrease out of proportion to CAD.   For HTN will adjust meds in setting of HF and gently will watch pressures closely with recent CVA.Cardiology will follow pt on CIR.   Total: 5 NIHSS    Past Medical History  Past Medical History:  Diagnosis Date  . ADHD (  attention deficit hyperactivity disorder)   . Anxiety   . CVA (cerebral vascular accident) (HCC) 07/02/2017   "left sided weakness" (07/02/2017)  . Depression   . History of kidney stones     Family History  family history includes Colon cancer in his mother; Prostate cancer in his father; Stroke in his mother.  Prior Rehab/Hospitalizations:  Has the patient had major surgery during 100 days prior to admission? No  Current Medications   Current Facility-Administered Medications:  .   stroke: mapping our early stages of recovery book, , Does not apply, Once, Junious Silk L, NP .  0.9 %  sodium chloride infusion, 250 mL, Intravenous, PRN, Bensimhon, Bevelyn Buckles, MD .  acetaminophen (TYLENOL) tablet 650 mg, 650 mg, Oral, Q4H PRN **OR** acetaminophen (TYLENOL) solution 650 mg, 650 mg, Per Tube, Q4H PRN  **OR** acetaminophen (TYLENOL) suppository 650 mg, 650 mg, Rectal, Q4H PRN, Russella Dar, NP .  aspirin chewable tablet 81 mg, 81 mg, Oral, Daily, Bensimhon, Bevelyn Buckles, MD, 81 mg at 07/06/17 0900 .  atorvastatin (LIPITOR) tablet 80 mg, 80 mg, Oral, q1800, Marvel Plan, MD, 80 mg at 07/05/17 2135 .  clonazePAM (KLONOPIN) tablet 1 mg, 1 mg, Oral, TID PRN, Russella Dar, NP .  digoxin (LANOXIN) tablet 0.125 mg, 0.125 mg, Oral, Daily, Graciella Freer, PA-C, 0.125 mg at 07/06/17 1140 .  fenofibrate tablet 160 mg, 160 mg, Oral, Daily, Marvel Plan, MD, 160 mg at 07/06/17 0901 .  folic acid (FOLVITE) tablet 1 mg, 1 mg, Oral, Daily, Junious Silk L, NP, 1 mg at 07/06/17 0907 .  heparin injection 5,000 Units, 5,000 Units, Subcutaneous, Q8H, Bensimhon, Bevelyn Buckles, MD, 5,000 Units at 07/05/17 2136 .  hydrALAZINE (APRESOLINE) injection 10 mg, 10 mg, Intravenous, Q6H PRN, Russella Dar, NP .  multivitamin with minerals tablet 1 tablet, 1 tablet, Oral, Daily, Russella Dar, NP, 1 tablet at 07/06/17 0901 .  nicotine (NICODERM CQ - dosed in mg/24 hours) patch 14 mg, 14 mg, Transdermal, Daily, Aroor, Dara Lords, MD, 14 mg at 07/06/17 0903 .  ondansetron (ZOFRAN) injection 4 mg, 4 mg, Intravenous, Q6H PRN, Bensimhon, Bevelyn Buckles, MD .  sodium chloride flush (NS) 0.9 % injection 3 mL, 3 mL, Intravenous, Q12H, Bensimhon, Bevelyn Buckles, MD, 3 mL at 07/06/17 0905 .  sodium chloride flush (NS) 0.9 % injection 3 mL, 3 mL, Intravenous, PRN, Bensimhon, Bevelyn Buckles, MD .  spironolactone (ALDACTONE) tablet 12.5 mg, 12.5 mg, Oral, QHS, Graciella Freer, PA-C .  thiamine (VITAMIN B-1) tablet 100 mg, 100 mg, Oral, Daily, 100 mg at 07/06/17 0901 **OR** [DISCONTINUED] thiamine (B-1) injection 100 mg, 100 mg, Intravenous, Daily, Russella Dar, NP .  traZODone (DESYREL) tablet 100-150 mg, 100-150 mg, Oral, QHS PRN, Russella Dar, NP  Patients Current Diet: Diet Heart Room service appropriate? Yes; Fluid  consistency: Thin  Precautions / Restrictions Precautions Precautions: Fall Restrictions Weight Bearing Restrictions: No   Has the patient had 2 or more falls or a fall with injury in the past year?No  Prior Activity Level Community (5-7x/wk): Indpeendent, driving and working  Journalist, newspaper / Corporate investment banker Devices/Equipment: None Home Equipment: Tub bench  Prior Device Use: Indicate devices/aids used by the patient prior to current illness, exacerbation or injury? None of the above  Prior Functional Level Prior Function Level of Independence: Independent Comments: Warehouse work; usually desk work. driving  Self Care: Did the patient need help bathing, dressing, using the toilet or eating?  Independent  Indoor Mobility: Did the patient need assistance with walking from room to room (with or without device)? Independent  Stairs: Did the patient need assistance with internal or external stairs (with or without device)? Independent  Functional Cognition: Did the patient need help planning regular tasks such as shopping or remembering to take medications? Independent  Current Functional Level Cognition  Arousal/Alertness: Awake/alert Overall Cognitive Status: Within Functional Limits for tasks assessed Orientation Level: Oriented X4 General Comments: pt emotional today stating "I don't want to be a burden" Attention: Alternating Alternating Attention: Appears intact Memory: Appears intact Awareness: Appears intact Problem Solving: Appears intact Executive Function: Reasoning Reasoning: Appears intact    Extremity Assessment (includes Sensation/Coordination)    Lower Extremity Assessment: LLE deficits/detail LLE Deficits / Details: hip flexion 4-/5, knee extension 4+/5, ankle dorsiflexion 2+/5 LLE Coordination: decreased gross motor, decreased fine motor    ADLs  Overall ADL's : Needs assistance/impaired Eating/Feeding: Set up, Sitting Grooming:  Moderate assistance, Standing, Wash/dry hands Grooming Details (indicate cue type and reason): for standing balance Upper Body Bathing: Minimal assistance, Sitting, Cueing for compensatory techniques Upper Body Bathing Details (indicate cue type and reason): Pt performing UB bathing while seated in recliner with Min A for back and cues for compensatory techniques due to weakness in LUE. Pt able to bring LUE to R axialry area with increased time and effort, pt reporting "It is hard to apply any pressure to clean under my arm." Lower Body Bathing: Maximal assistance, Sit to/from stand Upper Body Dressing : Minimal assistance, Sitting Upper Body Dressing Details (indicate cue type and reason): Pt doffing/donning gown with Min A for managing gown and VCs for compensatory tehcniques. Donning LUE first. Lower Body Dressing: Moderate assistance Lower Body Dressing Details (indicate cue type and reason): to don socks in sitting Toilet Transfer: Moderate assistance, Ambulation(HHA and gait belt) Toilet Transfer Details (indicate cue type and reason): Simulated with functional mobility in room Functional mobility during ADLs: Moderate assistance(HHA) General ADL Comments: Encouraged functional use of LUE and purposeful movement/exercises    Mobility  Overal bed mobility: Needs Assistance Bed Mobility: Supine to Sit Rolling: Min guard Sidelying to sit: Min assist Supine to sit: Min assist General bed mobility comments: Assist for trunk elevation to sitting due to poor balance.    Transfers  Overall transfer level: Needs assistance Equipment used: None Transfers: Sit to/from Stand Sit to Stand: Min assist General transfer comment: Min assist for balance and safety    Ambulation / Gait / Stairs / Wheelchair Mobility  Ambulation/Gait Ambulation/Gait assistance: ArchitectMin assist Ambulation Distance (Feet): 30 Feet Assistive device: 1 person hand held assist(2nd person for chair follow) Gait  Pattern/deviations: Decreased stride length, Step-through pattern, Decreased weight shift to left, Decreased dorsiflexion - left General Gait Details: pt with L LE weakness/instability causing buckling when in stance phase requiring modA to prevent. when pt focuses on terminal knee extension pt able to maintain 50% of time. v/c's for sequencing steps and modA for assisting with weight shifting Gait velocity: slow Gait velocity interpretation: Below normal speed for age/gender    Posture / Balance Dynamic Sitting Balance Sitting balance - Comments: pt with posterior lean when performing LE seated ther ex Balance Overall balance assessment: Needs assistance Sitting-balance support: No upper extremity supported, Feet supported Sitting balance-Leahy Scale: Fair Sitting balance - Comments: pt with posterior lean when performing LE seated ther ex Standing balance support: Single extremity supported Standing balance-Leahy Scale: Poor Standing balance comment: min-mod assist for standing balance  Special needs/care consideration BiPAP/CPAP n/a CPM n/a Continuous Drip IV n/a Dialysis n/a Life Vest  N/a Oxygen  N/a Special Bed n/a Trach Size n/a Wound Vac n/a Skin intact Bowel mgmt: continent Bladder mgmt: continent Diabetic mgmt n/a No PCP New CVA and new dx of heart issues   Previous Home Environment Living Arrangements: Spouse/significant other  Lives With: Spouse Available Help at Discharge: Family, Available 24 hours/day Type of Home: House Home Layout: Two level Alternate Level Stairs-Number of Steps: flight Home Access: Stairs to enter Secretary/administrator of Steps: 1 Bathroom Shower/Tub: Hydrographic surveyor, Engineer, building services: Pharmacist, community: Yes How Accessible: Accessible via walker Home Care Services: No Additional Comments: pt also assists with his Dad as caregiver  Discharge Living Setting Plans for Discharge Living Setting: Patient's home,  Lives with (comment)(spouse) Type of Home at Discharge: House Discharge Home Layout: Two level Alternate Level Stairs-Number of Steps: flight Discharge Home Access: Stairs to enter Entrance Stairs-Rails: None Entrance Stairs-Number of Steps: 1 Discharge Bathroom Shower/Tub: Tub/shower unit, Curtain Discharge Bathroom Toilet: Standard Discharge Bathroom Accessibility: Yes How Accessible: Accessible via walker Does the patient have any problems obtaining your medications?: No  Social/Family/Support Systems Patient Roles: Spouse, Caregiver, Other (Comment)(fulltime employee) Contact Information: Aram Beecham, wife Anticipated Caregiver: wife Anticipated Industrial/product designer Information: see above Ability/Limitations of Caregiver: wife to take FMLA as needed Caregiver Availability: 24/7 Discharge Plan Discussed with Primary Caregiver: Yes Is Caregiver In Agreement with Plan?: Yes Does Caregiver/Family have Issues with Lodging/Transportation while Pt is in Rehab?: No  Goals/Additional Needs Patient/Family Goal for Rehab: Mod I to supervision with PT, OT, and SLP Expected length of stay: [facility] 13-18 days Special Service Needs: needs folowup substance abuse resources; wife is aware of his substance abuse; other family members do not Pt/Family Agrees to Admission and willing to participate: Yes Program Orientation Provided & Reviewed with Pt/Caregiver Including Roles  & Responsibilities: Yes  Decrease burden of Care through IP rehab admission: n/a  Possible need for SNF placement upon discharge: no  Patient Condition: This patient's medical and functional status has changed since the consult dated: 07/03/2017 in which the Rehabilitation Physician determined and documented that the patient's condition is appropriate for intensive rehabilitative care in an inpatient rehabilitation facility. See "History of Present Illness" (above) for medical update. Functional changes are: mod assist. Patient's  medical and functional status update has been discussed with the Rehabilitation physician and patient remains appropriate for inpatient rehabilitation. Will admit to inpatient rehab today.  Preadmission Screen Completed By:  Clois Dupes, 07/06/2017 12:05 PM ______________________________________________________________________   Discussed status with Dr. Riley Kill on 07/06/2017 at 1219 and received telephone approval for admission today.  Admission Coordinator:  Clois Dupes, time 4098 Date 07/06/2017

## 2017-07-06 NOTE — Progress Notes (Signed)
Physical Medicine and Rehabilitation Consult   Reason for Consult: Left sided weakness and slurred speech Referring Physician: Dr. Pearlean Brownie.    HPI: Angel Davies is a 46 y.o. male with history of anxiety, depression/anxiety, fall 12/2016 with loss of peripheral vision and onset of HA, polysubtance abuse who was admitted on 07/02/17 with fall, slurred speech and left sided weakness. Patient reported that he was drinking ETOH and used cocaine the night before--UDS positive for cocaine and THC. CT head negative. MRI/MRA brain done revealing " Acute RIGHT pontine infarct, Subacute small RIGHT temporal occipital lobe/PCA territory Infarct and severe stenosis distal rigth P2 segment --CTA recommended to rule out [facility] injury.  Work up underway and therapy evaluations doone    Review of Systems  Constitutional: Negative for chills and fever.  HENT: Negative for hearing loss and tinnitus.   Eyes: Negative for blurred vision and double vision.  Respiratory: Negative for cough and shortness of breath.   Cardiovascular: Negative for chest pain and palpitations.  Gastrointestinal: Positive for heartburn. Negative for abdominal pain.  Genitourinary: Negative for dysuria, frequency and urgency.  Musculoskeletal: Negative for back pain and myalgias.  Neurological: Positive for speech change, focal weakness, weakness and headaches.       LLE spasms  Psychiatric/Behavioral: Negative for memory loss. The patient does not have insomnia.           Past Medical History:  Diagnosis Date  . ADHD (attention deficit hyperactivity disorder)   . Anxiety   . CVA (cerebral vascular accident) (HCC) 07/02/2017   "left sided weakness" (07/02/2017)  . Depression   . History of kidney stones          Past Surgical History:  Procedure Laterality Date  . CYSTOSCOPY WITH URETEROSCOPY, STONE BASKETRY AND STENT PLACEMENT           Family History  Problem Relation Age of Onset  . Stroke  Mother   . Colon cancer Mother   . Prostate cancer Father   . Seizures Neg Hx     Social History:  Married. Independent and  reports that he has been smoking cigarettes--1 PPD.  He has a 30.00 pack-year smoking history.  He has never used smokeless tobacco. He reports that he drinks about 2.4 oz of alcohol per week. He reports that he uses drugs. Drugs: Marijuana and "Crack" cocaine.    Allergies: No Known Allergies          Medications Prior to Admission  Medication Sig Dispense Refill  . acetaminophen (TYLENOL) 500 MG tablet Take 1,000 mg by mouth every 6 (six) hours as needed for headache (pain).    Marland Kitchen amphetamine-dextroamphetamine (ADDERALL) 20 MG tablet Take 20 mg by mouth 4 (four) times daily.     . clonazePAM (KLONOPIN) 1 MG tablet Take 1 mg by mouth 3 (three) times daily as needed (jitters from adderall).     . traZODone (DESYREL) 50 MG tablet Take 100-150 mg by mouth at bedtime as needed for sleep.      Home: Home Living Family/patient expects to be discharged to:: Private residence Living Arrangements: Parent, Spouse/significant other Available Help at Discharge: Other (Comment)(father who has caregiver support 3x/week s/p cva) Type of Home: House Home Access: Stairs to enter [employer] of Steps: 1 Home Layout: Two level Alternate Level Stairs-Number of Steps: flight Bathroom Shower/Tub: Tub/shower unit, Engineer, building services: Standard Home Equipment: Tub bench  Lives With: Spouse  Functional History: Prior Function Level of Independence: Independent Comments: Warehouse work;  usually desk work. driving Functional Status:  Mobility: Bed Mobility Overal bed mobility: Needs Assistance Bed Mobility: Supine to Sit Supine to sit: Min assist General bed mobility comments: Pt utilizes therapists arm with his RUE in order to pull himself up from supine to sitting EOB.  Transfers Overall transfer level: Needs assistance Equipment used:  None Transfers: Sit to/from Stand Sit to Stand: Min assist, Min guard General transfer comment: sit to-stand x5 with min A for safety and standing balance. Pt able to stand from EOB using back of chair for UE support and PT min guard. Pt stands from chair with right UE support on railing and PT min assist.  Pt educated on placement of LUE on LLE in order to encourage weight bearing through paretic extremities.  Ambulation/Gait Ambulation/Gait assistance: Min assist Ambulation Distance (Feet): 20 Feet(+10 +10) Assistive device: None(railing on right side) Gait Pattern/deviations: Decreased step length - left, Narrow base of support, Step-through pattern, Decreased dorsiflexion - right General Gait Details: Pt demosntrates abnormal gait pattern, taking large steps with right LE. pt with poor motor control, however pt able to decrease gait speed and take smaller steps with verbal cueing. Pt's left knee buckles during ambulation --> therapist blocks left knee for safety.    ADL: ADL Overall ADL's : Needs assistance/impaired Eating/Feeding: Set up, Sitting Grooming: Set up, Sitting Upper Body Bathing: Minimal assistance, Sitting, Cueing for compensatory techniques Upper Body Bathing Details (indicate cue type and reason): Pt performing UB bathing while seated in recliner with Min A for back and cues for compensatory techniques due to weakness in LUE. Pt able to bring LUE to R axialry area with increased time and effort, pt reporting "It is hard to apply any pressure to clean under my arm." Lower Body Bathing: Maximal assistance, Sit to/from stand Upper Body Dressing : Minimal assistance, Sitting Upper Body Dressing Details (indicate cue type and reason): Pt doffing/donning gown with Min A for managing gown and VCs for compensatory tehcniques. Donning LUE first. Lower Body Dressing: Maximal assistance, Sit to/from stand Lower Body Dressing Details (indicate cue type and reason): Max A for standing  balance during dynamic movements. Max hand over hand to facilitate donning of socks; pt able to put L thumb inside sock and then required Max A to maintain grasp on sock.  Toilet Transfer: Maximal assistance, Ambulation Toilet Transfer Details (indicate cue type and reason): Simulated to recliner. Max A due to poor balance and buckling of knees. Functional mobility during ADLs: Maximal assistance General ADL Comments: Pt demonstrating good potensial for therapy. Pt    Cognition: Cognition Overall Cognitive Status: Within Functional Limits for tasks assessed Arousal/Alertness: Awake/alert Orientation Level: Oriented X4 Attention: Alternating Alternating Attention: Appears intact Memory: Appears intact Awareness: Appears intact Problem Solving: Appears intact Executive Function: Reasoning Reasoning: Appears intact Cognition Arousal/Alertness: Awake/alert Behavior During Therapy: WFL for tasks assessed/performed, Impulsive Overall Cognitive Status: Within Functional Limits for tasks assessed Area of Impairment: Memory, Problem solving Memory: Decreased short-term memory Problem Solving: Requires verbal cues, Difficulty sequencing General Comments: Pt jokes around intermittently, making cognition difficult to assess. 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 Pt unable to recall many details of OT session earlier in the day. Pt does well with verbal cueing in order to carry out tasks.    Blood pressure (!) 147/82, pulse 74, temperature 97.9 F (36.6 C), temperature source Oral, resp. rate 20, height 5\' 9"  (1.753 m),  weight 87.5 kg (193 lb), SpO2 95 %. Physical Exam  Nursing note and vitals reviewed. Constitutional: He is oriented to person, place, and time. He appears well-developed and well-nourished. No distress.  HENT:  Head: Normocephalic and atraumatic.  Eyes: Conjunctivae and EOM are normal. Pupils are  equal, round, and reactive to light. Right eye exhibits no discharge. Left eye exhibits no discharge.  Neck: Normal range of motion. Neck supple.  Cardiovascular: Normal rate and regular rhythm.  Respiratory: Effort normal and breath sounds normal. No stridor. No respiratory distress. He has no wheezes.  GI: Soft. Bowel sounds are normal. He exhibits no distension. There is no tenderness.  Musculoskeletal:  LLE spasms noted  Neurological: He is alert and oriented to person, place, and time.  Left facial weakness with moderate dysarthria. Left visual field deficits unchanged. Able to follow one and two step motor commands. Left sided weakness noted.  Patient is grossly  2- out of 5 left upper extremity proximal to distal.  Left lower extremity 3- out of 5 hip flexors, knee extensors 2 traced distally at the ankle.  Left heel cord tight  Skin: Skin is warm and dry. He is not diaphoretic.  Psychiatric: He has a normal mood and affect. His behavior is normal. Thought content normal.    LabResultsLast24Hours       Results for orders placed or performed during the hospital encounter of 07/02/17 (from the past 24 hour(s))  Urine rapid drug screen (hosp performed)     Status: Abnormal   Collection Time: 07/02/17  5:58 PM  Result Value Ref Range   Opiates NONE DETECTED NONE DETECTED   Cocaine POSITIVE (A) NONE DETECTED   Benzodiazepines NONE DETECTED NONE DETECTED   Amphetamines NONE DETECTED NONE DETECTED   Tetrahydrocannabinol POSITIVE (A) NONE DETECTED   Barbiturates NONE DETECTED NONE DETECTED  Urinalysis, Routine w reflex microscopic     Status: None   Collection Time: 07/02/17  5:58 PM  Result Value Ref Range   Color, Urine YELLOW YELLOW   APPearance CLEAR CLEAR   Specific Gravity, Urine 1.016 1.005 - 1.030   pH 5.0 5.0 - 8.0   Glucose, UA NEGATIVE NEGATIVE mg/dL   Hgb urine dipstick NEGATIVE NEGATIVE   Bilirubin Urine NEGATIVE NEGATIVE   Ketones, ur NEGATIVE  NEGATIVE mg/dL   Protein, ur NEGATIVE NEGATIVE mg/dL   Nitrite NEGATIVE NEGATIVE   Leukocytes, UA NEGATIVE NEGATIVE  Hemoglobin A1c     Status: Abnormal   Collection Time: 07/03/17  2:33 AM  Result Value Ref Range   Hgb A1c MFr Bld 6.0 (H) 4.8 - 5.6 %   Mean Plasma Glucose 125.5 mg/dL  Lipid panel     Status: Abnormal   Collection Time: 07/03/17  2:33 AM  Result Value Ref Range   Cholesterol 303 (H) 0 - 200 mg/dL   Triglycerides 161539 (H) <150 mg/dL   HDL 31 (L) >09>40 mg/dL   Total CHOL/HDL Ratio 9.8 RATIO   VLDL UNABLE TO CALCULATE IF TRIGLYCERIDE OVER 400 mg/dL 0 - 40 mg/dL   LDL Cholesterol UNABLE TO CALCULATE IF TRIGLYCERIDE OVER 400 mg/dL 0 - 99 mg/dL  Basic metabolic panel     Status: Abnormal   Collection Time: 07/03/17  7:59 AM  Result Value Ref Range   Sodium 137 135 - 145 mmol/L   Potassium 3.8 3.5 - 5.1 mmol/L   Chloride 105 101 - 111 mmol/L   CO2 23 22 - 32 mmol/L   Glucose, Bld 143 (H) 65 -  99 mg/dL   BUN 7 6 - 20 mg/dL   Creatinine, Ser 3.66 0.61 - 1.24 mg/dL   Calcium 9.4 8.9 - 44.0 mg/dL   GFR calc non Af Amer >60 >60 mL/min   GFR calc Af Amer >60 >60 mL/min   Anion gap 9 5 - 15  CBC with Differential/Platelet     Status: Abnormal   Collection Time: 07/03/17  7:59 AM  Result Value Ref Range   WBC 12.6 (H) 4.0 - 10.5 K/uL   RBC 5.33 4.22 - 5.81 MIL/uL   Hemoglobin 16.4 13.0 - 17.0 g/dL   HCT 34.7 42.5 - 95.6 %   MCV 91.4 78.0 - 100.0 fL   MCH 30.8 26.0 - 34.0 pg   MCHC 33.7 30.0 - 36.0 g/dL   RDW 38.7 56.4 - 33.2 %   Platelets 281 150 - 400 K/uL   Neutrophils Relative % 48 %   Neutro Abs 6.0 1.7 - 7.7 K/uL   Lymphocytes Relative 40 %   Lymphs Abs 5.1 (H) 0.7 - 4.0 K/uL   Monocytes Relative 10 %   Monocytes Absolute 1.3 (H) 0.1 - 1.0 K/uL   Eosinophils Relative 2 %   Eosinophils Absolute 0.2 0.0 - 0.7 K/uL   Basophils Relative 0 %   Basophils Absolute 0.0 0.0 - 0.1 K/uL       ImagingResults(Last48hours)  Ct Angio Head W Or Wo Contrast  Result Date: 07/03/2017 CLINICAL DATA:  Stroke follow-up. EXAM: CT ANGIOGRAPHY HEAD AND NECK TECHNIQUE: Multidetector CT imaging of the head and neck was performed using the standard protocol during bolus administration of intravenous contrast. Multiplanar CT image reconstructions and MIPs were obtained to evaluate the vascular anatomy. Carotid stenosis measurements (when applicable) are obtained utilizing NASCET criteria, using the distal internal carotid diameter as the denominator. CONTRAST:  <See Chart> ISOVUE-370 IOPAMIDOL (ISOVUE-370) INJECTION 76%50 cc Isovue 370 intravenous COMPARISON:  Brain MRI and intracranial MRA from yesterday FINDINGS: CTA NECK FINDINGS Aortic arch: 3 vessel branching. Noncalcified atheromatous wall thickening. No acute finding or dilatation. Right carotid system: Questionable atheromatous wall thickening of the common carotid artery and ICA bulb. No ulceration, dissection, or beading. Left carotid system: No stenosis, ulceration, or beading. No definitive atherosclerosis. Vertebral arteries: Proximal subclavian noncalcified atherosclerosis with up to 40% stenosis. Right dominant vertebral artery. Limited visualization of distal left V1 segment due to neighboring streak artifact from intravenous contrast. No dissection or beading is visualized. Skeleton: No acute or aggressive finding Other neck: No noted mass or inflammation. Upper chest: Centrilobular emphysema. Review of the MIP images confirms the above findings CTA HEAD FINDINGS Anterior circulation: Atheromatous plaque on the carotid siphons that is mild. Mild narrowing at the left paraclinoid ICA segment. No branch occlusion or flow limiting stenosis. Negative for aneurysm; bulbous appearance of the left MCA bifurcation on thick MIPS is related to trifurcation. Reference 3D reformats. Posterior circulation: Dominant right vertebral artery. Most of left  vertebral flow is into the left PICA. Dominant rightAICA. No branch occlusion. High-grade right P2 segment stenosis. Negative for aneurysm. Venous sinuses: Patent Anatomic variants: None significant Delayed phase: No abnormal intracranial enhancement. Known acute right pontine infarct. Known bilateral occipital infarcts. Review of the MIP images confirms the above findings IMPRESSION: 1. No emergent vascular finding. 2. Prominent noncalcified atherosclerotic plaque in the left subclavian artery with up to 40% stenosis. No superimposed ulceration. 3. Intracranial atherosclerosis along the bilateral carotid siphons with mild left paraclinoid ICA narrowing. Known high-grade right P2 segment stenosis. 4.  Emphysema (ICD10-J43.9). Electronically Signed   By: Marnee Spring M.D.   On: 07/03/2017 15:03   Ct Angio Neck W Or Wo Contrast  Result Date: 07/03/2017 CLINICAL DATA:  Stroke follow-up. EXAM: CT ANGIOGRAPHY HEAD AND NECK TECHNIQUE: Multidetector CT imaging of the head and neck was performed using the standard protocol during bolus administration of intravenous contrast. Multiplanar CT image reconstructions and MIPs were obtained to evaluate the vascular anatomy. Carotid stenosis measurements (when applicable) are obtained utilizing NASCET criteria, using the distal internal carotid diameter as the denominator. CONTRAST:  <See Chart> ISOVUE-370 IOPAMIDOL (ISOVUE-370) INJECTION 76%50 cc Isovue 370 intravenous COMPARISON:  Brain MRI and intracranial MRA from yesterday FINDINGS: CTA NECK FINDINGS Aortic arch: 3 vessel branching. Noncalcified atheromatous wall thickening. No acute finding or dilatation. Right carotid system: Questionable atheromatous wall thickening of the common carotid artery and ICA bulb. No ulceration, dissection, or beading. Left carotid system: No stenosis, ulceration, or beading. No definitive atherosclerosis. Vertebral arteries: Proximal subclavian noncalcified atherosclerosis with up to  40% stenosis. Right dominant vertebral artery. Limited visualization of distal left V1 segment due to neighboring streak artifact from intravenous contrast. No dissection or beading is visualized. Skeleton: No acute or aggressive finding Other neck: No noted mass or inflammation. Upper chest: Centrilobular emphysema. Review of the MIP images confirms the above findings CTA HEAD FINDINGS Anterior circulation: Atheromatous plaque on the carotid siphons that is mild. Mild narrowing at the left paraclinoid ICA segment. No branch occlusion or flow limiting stenosis. Negative for aneurysm; bulbous appearance of the left MCA bifurcation on thick MIPS is related to trifurcation. Reference 3D reformats. Posterior circulation: Dominant right vertebral artery. Most of left vertebral flow is into the left PICA. Dominant rightAICA. No branch occlusion. High-grade right P2 segment stenosis. Negative for aneurysm. Venous sinuses: Patent Anatomic variants: None significant Delayed phase: No abnormal intracranial enhancement. Known acute right pontine infarct. Known bilateral occipital infarcts. Review of the MIP images confirms the above findings IMPRESSION: 1. No emergent vascular finding. 2. Prominent noncalcified atherosclerotic plaque in the left subclavian artery with up to 40% stenosis. No superimposed ulceration. 3. Intracranial atherosclerosis along the bilateral carotid siphons with mild left paraclinoid ICA narrowing. Known high-grade right P2 segment stenosis. 4.  Emphysema (ICD10-J43.9). Electronically Signed   By: Marnee Spring M.D.   On: 07/03/2017 15:03   Mr Brain Wo Contrast  Result Date: 07/02/2017 CLINICAL DATA:  Ataxia.  LEFT-sided numbness.  Suspect stroke. EXAM: MRI HEAD WITHOUT CONTRAST MRA HEAD WITHOUT CONTRAST TECHNIQUE: Multiplanar, multiecho pulse sequences of the brain and surrounding structures were obtained without intravenous contrast. Angiographic images of the head were obtained using MRA  technique without contrast. COMPARISON:  CT HEAD July 02, 2017 at 1447 hours FINDINGS: MRI HEAD FINDINGS BRAIN: 2.3 x 1.2 cm reduced diffusion RIGHT pons with low ADC values. Faint reduced diffusion RIGHT temporal occipital lobe with T2 shine through and, intermediate FLAIR signal, minimal T1 shortening consistent with mineralization. Small area mesial LEFT occipital lobe encephalomalacia. No susceptibility artifact to suggest hemorrhage. No midline shift, mass effect or masses. Ventricles and sulci are normal for patient's age. No abnormal extra-axial fluid collections. VASCULAR: See below. SKULL AND UPPER CERVICAL SPINE: No abnormal sellar expansion. No suspicious calvarial bone marrow signal. Craniocervical junction maintained. SINUSES/ORBITS: Trace paranasal sinus mucosal thickening. Mastoid air cells are well aerated. The included ocular globes and orbital contents are non-suspicious. OTHER: None. MRA HEAD FINDINGS ANTERIOR CIRCULATION: Normal flow related enhancement of the included cervical, petrous, cavernous and supraclinoid internal carotid  arteries. Patent anterior communicating artery. Patent anterior and middle cerebral arteries, including distal segments. No large vessel occlusion, flow limiting stenosis, aneurysm. POSTERIOR CIRCULATION: RIGHT vertebral artery is dominant. Basilar artery is patent, with normal flow related enhancement of the main branch vessels. Proximal bilateral posterior cerebral artery's patent. Severe stenosis distal RIGHT P2 segment, moderate irregularity and poor flow related enhancement distal bilateral posterior cerebral artery's. No large vessel occlusion, aneurysm. ANATOMIC VARIANTS: None. Source images and MIP images were reviewed. IMPRESSION: MRI HEAD: 1. 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 2. Subacute small RIGHT temporal occipital lobe/PCA territory infarct. Old LEFT occipital lobe/PCA territory infarct. MRA HEAD: 1. No emergent large vessel  occlusion. 2. Severe stenosis distal RIGHT P2 segment and, poor flow related enhancement distal posterior cerebral artery's. Given posterior circulation territory infarcts of varying ages, recommend CTA HEAD and neck to exclude vertebral artery injury and further assess posterior cerebral artery's. These results will be called to the ordering clinician or representative by the Radiologist Assistant, and communication documented in the PACS or zVision Dashboard. Electronically Signed   By: Awilda Metroourtnay  Bloomer M.D.   On: 07/02/2017 21:13   Mr Maxine GlennMra Head Wo Contrast  Result Date: 07/02/2017 CLINICAL DATA:  Ataxia.  LEFT-sided numbness.  Suspect stroke. EXAM: MRI HEAD WITHOUT CONTRAST MRA HEAD WITHOUT CONTRAST TECHNIQUE: Multiplanar, multiecho pulse sequences of the brain and surrounding structures were obtained without intravenous contrast. Angiographic images of the head were obtained using MRA technique without contrast. COMPARISON:  CT HEAD July 02, 2017 at 1447 hours FINDINGS: MRI HEAD FINDINGS BRAIN: 2.3 x 1.2 cm reduced diffusion RIGHT pons with low ADC values. Faint reduced diffusion RIGHT temporal occipital lobe with T2 shine through and, intermediate FLAIR signal, minimal T1 shortening consistent with mineralization. Small area mesial LEFT occipital lobe encephalomalacia. No susceptibility artifact to suggest hemorrhage. No midline shift, mass effect or masses. Ventricles and sulci are normal for patient's age. No abnormal extra-axial fluid collections. VASCULAR: See below. SKULL AND UPPER CERVICAL SPINE: No abnormal sellar expansion. No suspicious calvarial bone marrow signal. Craniocervical junction maintained. SINUSES/ORBITS: Trace paranasal sinus mucosal thickening. Mastoid air cells are well aerated. The included ocular globes and orbital contents are non-suspicious. OTHER: None. MRA HEAD FINDINGS ANTERIOR CIRCULATION: Normal flow related enhancement of the included cervical, petrous, cavernous and  supraclinoid internal carotid arteries. Patent anterior communicating artery. Patent anterior and middle cerebral arteries, including distal segments. No large vessel occlusion, flow limiting stenosis, aneurysm. POSTERIOR CIRCULATION: RIGHT vertebral artery is dominant. Basilar artery is patent, with normal flow related enhancement of the main branch vessels. Proximal bilateral posterior cerebral artery's patent. Severe stenosis distal RIGHT P2 segment, moderate irregularity and poor flow related enhancement distal bilateral posterior cerebral artery's. No large vessel occlusion, aneurysm. ANATOMIC VARIANTS: None. Source images and MIP images were reviewed. IMPRESSION: MRI HEAD: 1. 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 2. Subacute small RIGHT temporal occipital lobe/PCA territory infarct. Old LEFT occipital lobe/PCA territory infarct. MRA HEAD: 1. No emergent large vessel occlusion. 2. Severe stenosis distal RIGHT P2 segment and, poor flow related enhancement distal posterior cerebral artery's. Given posterior circulation territory infarcts of varying ages, recommend CTA HEAD and neck to exclude vertebral artery injury and further assess posterior cerebral artery's. These results will be called to the ordering clinician or representative by the Radiologist Assistant, and communication documented in the PACS or zVision Dashboard. Electronically Signed   By: Awilda Metroourtnay  Bloomer M.D.   On: 07/02/2017 21:13   Ct Head  Code Stroke Wo Contrast  Result Date: 07/02/2017 CLINICAL DATA:  Code stroke. Left facial droop and weakness. Slurred speech. EXAM: CT HEAD WITHOUT CONTRAST TECHNIQUE: Contiguous axial images were obtained from the base of the skull through the vertex without intravenous contrast. COMPARISON:  01/05/2017 FINDINGS: Brain: No evidence of acute infarction, hemorrhage, hydrocephalus, extra-axial collection or mass lesion/mass effect. Chronic small bilateral occipital infarcts. Vascular:  No hyperdense vessel. Skull: No acute or aggressive finding Sinuses/Orbits: Negative Other: These results were communicated to Dr. Laurence Slate at 07/02/2017 2:49 pmon 07/02/2017 by text page via the [facility] messaging system. Aspects is 10. IMPRESSION: 1. No acute finding. 2. Small bilateral occipital infarcts that have become apparent since 01/15/2017 but are chronic. Electronically Signed   By: Marnee Spring M.D.   On: 07/02/2017 15:04     Assessment/Plan: Diagnosis: AcuteRight pontine/subacute right temporal occipital infarcts with left hemiparesis 1. Does the need for close, 24 hr/day medical supervision in concert with the patient's rehab needs make it unreasonable for this patient to be served in a less intensive setting? Yes 2. Co-Morbidities requiring supervision/potential complications: Alcohol use, cocaine abuse, hypertension 3. Due to bladder management, bowel management, safety, skin/wound care, disease management, medication administration, pain management and patient education, does the patient require 24 hr/day rehab nursing? Yes 4. Does the patient require coordinated care of a physician, rehab nurse, PT (1-2 hrs/day, 5 days/week), OT (1-2 hrs/day, 5 days/week) and SLP (1-2 hrs/day, 5 days/week) to address physical and functional deficits in the context of the above medical diagnosis(es)? Yes Addressing deficits in the following areas: balance, endurance, locomotion, strength, transferring, bowel/bladder control, bathing, dressing, feeding, grooming, toileting, speech and psychosocial support 5. Can the patient actively participate in an intensive therapy program of at least 3 hrs of therapy per day at least 5 days per week? Yes 6. The potential for patient to make measurable gains while on inpatient rehab is excellent 7. Anticipated functional outcomes upon discharge from inpatient rehab are modified independent and supervision  with PT, modified independent and supervision with OT, modified  independent with SLP. 8. Estimated rehab length of stay to reach the above functional goals is: 13-18 days 9. Anticipated D/C setting: Home 10. Anticipated post D/C treatments: HH therapy and Outpatient therapy 11. Overall Rehab/Functional Prognosis: excellent  RECOMMENDATIONS: This patient's condition is appropriate for continued rehabilitative care in the following setting: CIR Patient has agreed to participate in recommended program. Yes Note that insurance prior authorization may be required for reimbursement for recommended care.  Comment: Rehab Admissions Coordinator to follow up.  Thanks,  Ranelle Oyster, MD, Earlie Counts, PA-C 07/03/2017

## 2017-07-06 NOTE — Progress Notes (Signed)
Service:  07/06/2017 12:05 PM       Related encounter: ED to Hosp-Admission (Current) from 07/02/2017 in [facility]      Signed           [] Hide copied text  [] Hover for details   PMR Admission Coordinator Pre-Admission Assessment  Patient: Angel Davies is an 46 y.o., male MRN: [id number] DOB: 12/08/1970 Height: 5\' 9"  (175.3 cm) Weight: 88 kg (194 lb 0.1 oz)                                                                                                                                                  Insurance Information HMO:     PPO:      PCP:      IPA:      80/20:      OTHER: Choice fund HRA open access Davies PRIMARY: [employer]      Policy#: [id number]      Subscriber: pt CM Name: Angel LodgeDonna Davies      Phone#: [phone] ext [phone]     Fax#: [phone] has full Epic access Pre-Cert#: [id number]      Approved 12/6 through 07/11/17 Benefits:  Phone #: [phone]     Name: 07/04/2017 Eff. Date: 02/28/2017     Deduct: $1500      Out of Pocket Max: $3500 includes deductible      Life Max: none CIR: 80%      SNF: 80% Outpatient: 80%     Co-Pay: visits per medical neccesity preauth only Home Health: 80%      Co-Pay: 120 visits combined. preauth not required DME: 80%     Co-Pay: thorough Care [employer] [phone] Providers: in network  SECONDARY: none        Medicaid Application Date:      Case Manager:  Disability Application Date:       Case Worker:   Emergency Contact Information        Contact Information    Name Relation Home Work Mobile   EastvilleBranly,Angel Davies   [phone]     Current Medical History  Patient Admitting Diagnosis: right CVA  History of Present Illness:  HCW:CBJSEHPI:Angel D Branlyis a 45 y.o.malewith history of anxiety, depression/anxiety, fall 12/2016 with loss of peripheral vision and onset of HA, polysubstance abuse who was admitted on 07/02/17 with fall, slurred speech and left sided weakness. Patient reported that he was  drinking ETOH and used cocaine the night before--UDS positive for cocaine and THC. CT head negative. MRI/MRA brain done revealing " Acute RIGHT pontine infarct, Subacute small RIGHT temporal occipital lobe/PCA territory  Infarct and severe stenosis distal right P2 segment.CTAhead/neck was negative for emergent vascular finding, 40% left SA stenosis due to plaque and emphysema. 2D echo showed EF 20-25% with moderately dilated LV, akinesis of anterior and apical myocardium with grade  2 diastolic dysfunction.  Cardiology consulted due to low EF and Cardiac catherization 12/6/ revealed [facility] 07/05/17  Mid RCA to Dist RCA lesion is 100% stenosed.  Dist Cx lesion is 100% stenosed.  Mid LAD lesion is 100% stenosed.  Prox Cx to Dist Cx lesion is 30% stenosed.  Severe CAD with left dominant system and occluded mLAD, ischemic CM with EF 25 % by echo as well as well compensated filling pressures with moderatley reduced cardiac output. Davies - Add spiro 12.5 qhs and follow pressures closely.  - Will add losartan as pressures tolerate and consider Entresto.  - Add digoxin 0.125 mg daily. - Will need repeat echo 3 months after med optimizationfor ICD consideration. - cMRI pending with EF decrease out of proportion to CAD.  For HTN will adjust meds in setting of HF and gently will watch pressures closely with recent CVA.Cardiology will follow pt on CIR.   Total: 5 NIHSS  Past Medical History      Past Medical History:  Diagnosis Date  . ADHD (attention deficit hyperactivity disorder)   . Anxiety   . CVA (cerebral vascular accident) (HCC) 07/02/2017   "left sided weakness" (07/02/2017)  . Depression   . History of kidney stones     Family History  family history includes Colon cancer in his mother; Prostate cancer in his father; Stroke in his mother.  Prior Rehab/Hospitalizations:  Has the patient had major surgery during 100 days prior to admission? No  Current  Medications   Current Facility-Administered Medications:  .   stroke: mapping our early stages of recovery book, , Does not apply, Once, Angel Silk L, NP .  0.9 %  sodium chloride infusion, 250 mL, Intravenous, PRN, Bensimhon, Angel Buckles, MD .  acetaminophen (TYLENOL) tablet 650 mg, 650 mg, Oral, Q4H PRN **OR** acetaminophen (TYLENOL) solution 650 mg, 650 mg, Per Tube, Q4H PRN **OR** acetaminophen (TYLENOL) suppository 650 mg, 650 mg, Rectal, Q4H PRN, Angel Dar, NP .  aspirin chewable tablet 81 mg, 81 mg, Oral, Daily, Bensimhon, Angel Buckles, MD, 81 mg at 07/06/17 0900 .  atorvastatin (LIPITOR) tablet 80 mg, 80 mg, Oral, q1800, Angel Plan, MD, 80 mg at 07/05/17 2135 .  clonazePAM (KLONOPIN) tablet 1 mg, 1 mg, Oral, TID PRN, Angel Dar, NP .  digoxin (LANOXIN) tablet 0.125 mg, 0.125 mg, Oral, Daily, Angel Freer, PA-C, 0.125 mg at 07/06/17 1140 .  fenofibrate tablet 160 mg, 160 mg, Oral, Daily, Angel Plan, MD, 160 mg at 07/06/17 0901 .  folic acid (FOLVITE) tablet 1 mg, 1 mg, Oral, Daily, Angel Silk L, NP, 1 mg at 07/06/17 0907 .  heparin injection 5,000 Units, 5,000 Units, Subcutaneous, Q8H, Bensimhon, Angel Buckles, MD, 5,000 Units at 07/05/17 2136 .  hydrALAZINE (APRESOLINE) injection 10 mg, 10 mg, Intravenous, Q6H PRN, Angel Dar, NP .  multivitamin with minerals tablet 1 tablet, 1 tablet, Oral, Daily, Angel Dar, NP, 1 tablet at 07/06/17 0901 .  nicotine (NICODERM CQ - dosed in mg/24 hours) patch 14 mg, 14 mg, Transdermal, Daily, Aroor, Angel Lords, MD, 14 mg at 07/06/17 0903 .  ondansetron (ZOFRAN) injection 4 mg, 4 mg, Intravenous, Q6H PRN, Bensimhon, Angel Buckles, MD .  sodium chloride flush (NS) 0.9 % injection 3 mL, 3 mL, Intravenous, Q12H, Bensimhon, Angel Buckles, MD, 3 mL at 07/06/17 0905 .  sodium chloride flush (NS) 0.9 % injection 3 mL, 3 mL, Intravenous, PRN, Bensimhon, Angel Buckles, MD .  spironolactone (ALDACTONE) tablet 12.5 mg,  12.5 mg, Oral, QHS, Angel Freerillery,  Angel Andrew, PA-C .  thiamine (VITAMIN B-1) tablet 100 mg, 100 mg, Oral, Daily, 100 mg at 07/06/17 0901 **OR** [DISCONTINUED] thiamine (B-1) injection 100 mg, 100 mg, Intravenous, Daily, Angel DarEllis, Allison L, NP .  traZODone (DESYREL) tablet 100-150 mg, 100-150 mg, Oral, QHS PRN, Angel DarEllis, Allison L, NP  Patients Current Diet: Diet Heart Room service appropriate? Yes; Fluid consistency: Thin  Precautions / Restrictions Precautions Precautions: Fall Restrictions Weight Bearing Restrictions: No   Has the patient had 2 or more falls or a fall with injury in the past year?No  Prior Activity Level Community (5-7x/wk): Indpeendent, driving and working  Journalist, newspaperHome Assistive Devices / Corporate investment bankerquipment Home Assistive Devices/Equipment: None Home Equipment: Tub bench  Prior Device Use: Indicate devices/aids used by the patient prior to current illness, exacerbation or injury? None of the above  Prior Functional Level Prior Function Level of Independence: Independent Comments: Warehouse work; usually desk work. driving  Self Care: Did the patient need help bathing, dressing, using the toilet or eating?  Independent  Indoor Mobility: Did the patient need assistance with walking from room to room (with or without device)? Independent  Stairs: Did the patient need assistance with internal or external stairs (with or without device)? Independent  Functional Cognition: Did the patient need help planning regular tasks such as shopping or remembering to take medications? Independent  Current Functional Level Cognition  Arousal/Alertness: Awake/alert Overall Cognitive Status: Within Functional Limits for tasks assessed Orientation Level: Oriented X4 General Comments: pt emotional today stating "I don't want to be a burden" Attention: Alternating Alternating Attention: Appears intact Memory: Appears intact Awareness: Appears intact Problem Solving: Appears intact Executive Function:  Reasoning Reasoning: Appears intact    Extremity Assessment (includes Sensation/Coordination)    Lower Extremity Assessment: LLE deficits/detail LLE Deficits / Details: hip flexion 4-/5, knee extension 4+/5, ankle dorsiflexion 2+/5 LLE Coordination: decreased gross motor, decreased fine motor    ADLs  Overall ADL's : Needs assistance/impaired Eating/Feeding: Set up, Sitting Grooming: Moderate assistance, Standing, Wash/dry hands Grooming Details (indicate cue type and reason): for standing balance Upper Body Bathing: Minimal assistance, Sitting, Cueing for compensatory techniques Upper Body Bathing Details (indicate cue type and reason): Pt performing UB bathing while seated in recliner with Min A for back and cues for compensatory techniques due to weakness in LUE. Pt able to bring LUE to R axialry area with increased time and effort, pt reporting "It is hard to apply any pressure to clean under my arm." Lower Body Bathing: Maximal assistance, Sit to/from stand Upper Body Dressing : Minimal assistance, Sitting Upper Body Dressing Details (indicate cue type and reason): Pt doffing/donning gown with Min A for managing gown and VCs for compensatory tehcniques. Donning LUE first. Lower Body Dressing: Moderate assistance Lower Body Dressing Details (indicate cue type and reason): to don socks in sitting Toilet Transfer: Moderate assistance, Ambulation(HHA and gait belt) Toilet Transfer Details (indicate cue type and reason): Simulated with functional mobility in room Functional mobility during ADLs: Moderate assistance(HHA) General ADL Comments: Encouraged functional use of LUE and purposeful movement/exercises    Mobility  Overal bed mobility: Needs Assistance Bed Mobility: Supine to Sit Rolling: Min guard Sidelying to sit: Min assist Supine to sit: Min assist General bed mobility comments: Assist for trunk elevation to sitting due to poor balance.    Transfers  Overall  transfer level: Needs assistance Equipment used: None Transfers: Sit to/from Stand Sit to Stand: Min assist General transfer comment: Min assist for balance  and safety    Ambulation / Gait / Stairs / Wheelchair Mobility  Ambulation/Gait Ambulation/Gait assistance: Architect (Feet): 30 Feet Assistive device: 1 person hand held assist(2nd person for chair follow) Gait Pattern/deviations: Decreased stride length, Step-through pattern, Decreased weight shift to left, Decreased dorsiflexion - left General Gait Details: pt with Davies LE weakness/instability causing buckling when in stance phase requiring modA to prevent. when pt focuses on terminal knee extension pt able to maintain 50% of time. v/c's for sequencing steps and modA for assisting with weight shifting Gait velocity: slow Gait velocity interpretation: Below normal speed for age/gender    Posture / Balance Dynamic Sitting Balance Sitting balance - Comments: pt with posterior lean when performing LE seated ther ex Balance Overall balance assessment: Needs assistance Sitting-balance support: No upper extremity supported, Feet supported Sitting balance-Leahy Scale: Fair Sitting balance - Comments: pt with posterior lean when performing LE seated ther ex Standing balance support: Single extremity supported Standing balance-Leahy Scale: Poor Standing balance comment: min-mod assist for standing balance    Special needs/care consideration BiPAP/CPAP n/a CPM n/a Continuous Drip IV n/a Dialysis n/a Life Vest  N/a Oxygen  N/a Special Bed n/a Trach Size n/a Wound Vac n/a Skin intact Bowel mgmt: continent Bladder mgmt: continent Diabetic mgmt n/a No PCP New CVA and new dx of heart issues   Previous Home Environment Living Arrangements: Davies/significant other  Lives With: Davies Available Help at Discharge: Family, Available 24 hours/day Type of Home: House Home Layout: Two level Alternate Level  Stairs-Number of Steps: flight Home Access: Stairs to enter Secretary/administrator of Steps: 1 Bathroom Shower/Tub: Hydrographic surveyor, Engineer, building services: Pharmacist, community: Yes How Accessible: Accessible via walker Home Care Services: No Additional Comments: pt also assists with his Dad as caregiver  Discharge Living Setting Plans for Discharge Living Setting: Patient's home, Lives with (comment)(Davies) Type of Home at Discharge: House Discharge Home Layout: Two level Alternate Level Stairs-Number of Steps: flight Discharge Home Access: Stairs to enter Entrance Stairs-Rails: None Entrance Stairs-Number of Steps: 1 Discharge Bathroom Shower/Tub: Tub/shower unit, Curtain Discharge Bathroom Toilet: Standard Discharge Bathroom Accessibility: Yes How Accessible: Accessible via walker Does the patient have any problems obtaining your medications?: No  Social/Family/Support Systems Patient Roles: Davies, Caregiver, Other (Comment)(fulltime employee) Contact Information: Aram Beecham, wife Anticipated Caregiver: wife Anticipated Industrial/product designer Information: see above Ability/Limitations of Caregiver: wife to take FMLA as needed Caregiver Availability: 24/7 Discharge Davies Discussed with Primary Caregiver: Yes Is Caregiver In Agreement with Davies?: Yes Does Caregiver/Family have Issues with Lodging/Transportation while Pt is in Rehab?: No  Goals/Additional Needs Patient/Family Goal for Rehab: Mod I to supervision with PT, OT, and SLP Expected length of stay: ELOS 13-18 days Special Service Needs: needs folowup substance abuse resources; wife is aware of his substance abuse; other family members do not Pt/Family Agrees to Admission and willing to participate: Yes Program Orientation Provided & Reviewed with Pt/Caregiver Including Roles  & Responsibilities: Yes  Decrease burden of Care through IP rehab admission: n/a  Possible need for SNF placement upon  discharge: no  Patient Condition: This patient's medical and functional status has changed since the consult dated: 07/03/2017 in which the Rehabilitation Physician determined and documented that the patient's condition is appropriate for intensive rehabilitative care in an inpatient rehabilitation facility. See "History of Present Illness" (above) for medical update. Functional changes are: mod assist. Patient's medical and functional status update has been discussed with the Rehabilitation physician and patient remains appropriate for inpatient rehabilitation.  Will admit to inpatient rehab today.  Preadmission Screen Completed By:  Clois Dupes, 07/06/2017 12:05 PM ______________________________________________________________________   Discussed status with Dr. Riley Kill on 07/06/2017 at 1219 and received telephone approval for admission today.  Admission Coordinator:  Clois Dupes, time 1610 Date 07/06/2017             Cosigned by: Ranelle Oyster, MD at 12

## 2017-07-06 NOTE — Progress Notes (Addendum)
STROKE TEAM PROGRESS NOTE   SUBJECTIVE (INTERVAL HISTORY) Wife is at the bedside. Patient is found sitting in chair in NAD  Overall he feels his condition is slowly improving. Affect slightly better today. Voices no new complaints. Hoping to be transferred to CIR soon.  OBJECTIVE Lab Results: CBC:  Recent Labs  Lab 07/04/17 0250 07/05/17 0257 07/06/17 0318  WBC 11.4* 11.6* 12.6*  HGB 16.4 16.5 16.4  HCT 49.7 49.8 49.0  MCV 92.6 91.2 92.1  PLT 282 311 300   BMP: Recent Labs  Lab 07/02/17 1440 07/02/17 1448 07/03/17 0759 07/04/17 0250 07/05/17 0257 07/06/17 0318  NA 137 140 137 136 137 138  K 4.6 4.4 3.8 4.2 3.8 4.1  CL 106 106 105 102 101 106  CO2 19*  --  23 23 24 22   GLUCOSE 140* 153* 143* 113* 107* 103*  BUN 12 13 7 9 12 13   CREATININE 1.14 1.00 0.85 1.01 0.91 0.94  CALCIUM 9.6  --  9.4 9.4 9.5 9.6   Urine Drug Screen:     Component Value Date/Time   LABOPIA NONE DETECTED 07/02/2017 1758   COCAINSCRNUR POSITIVE (A) 07/02/2017 1758   LABBENZ NONE DETECTED 07/02/2017 1758   AMPHETMU NONE DETECTED 07/02/2017 1758   THCU POSITIVE (A) 07/02/2017 1758   LABBARB NONE DETECTED 07/02/2017 1758    Alcohol Level:  Recent Labs  Lab 07/02/17 1551  ETH <10   PHYSICAL EXAM Temp:  [98 F (36.7 C)-98.5 F (36.9 C)] 98.1 F (36.7 C) (12/07 1446) Pulse Rate:  [73-107] 107 (12/07 1446) Resp:  [7-24] 24 (12/07 1446) BP: (96-122)/(62-88) 122/88 (12/07 1446) SpO2:  [90 %-98 %] 97 % (12/07 1446) Weight:  [88 kg (194 lb 0.1 oz)] 88 kg (194 lb 0.1 oz) (12/07 0631) General - Well nourished, well developed, in no apparent distress Respiratory - Lungs clear bilaterally. No wheezing. Cardiovascular - Regular rate and rhythm   Neurological Examination Mental Status: Alert, oriented, thought content appropriate.  Speech fluent with mild dysarthria but easily understandable; no evidence of aphasia.  Able to follow 3 step commands without difficulty. Cranial Nerves: II: +Left  upper and lower quadrant vision loss III,IV, VI: ptosis not present, extra-ocular motions intact bilaterally, pupils equal, round, reactive to light and accommodation.  V,VII: subtle left facial droop, facial light touch sensation normal bilaterally VIII: hearing normal bilaterally IX,X: uvula rises symmetrically XI: bilateral shoulder shrug XII: midline tongue extension Motor: Right :  Upper extremity  5/5             Lower extremity  5/5                             Left:     Upper extremity   4/5; mildly decreased left grip strength 4/5             Lower extremity   5/5; 5/5 with plantar flexion, 4/5 on dorsiflexion Tone and bulk:normal tone throughout; no atrophy noted Sensory: Pinprick and light touch intact throughout, bilaterally Deep Tendon Reflexes: 2+ and symmetric throughout Plantars: Right: downgoing                                Left: downgoing Cerebellar: +dysmetria with finger-to-nose on left,  Gait: not tested  IMAGING: I have personally reviewed the radiological images below and agree with the radiology interpretations.  MRI/ MRA Brain& Head Wo  Contrast Result Date: 07/02/2017 IMPRESSION: MRI HEAD: 1. 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 2. Subacute small RIGHT temporal occipital lobe/PCA territory infarct. Old LEFT occipital lobe/PCA territory infarct. MRA HEAD: 1. No emergent large vessel occlusion. 2. Severe stenosis distal RIGHT P2 segment and, poor flow related enhancement distal posterior cerebral artery's. Given posterior circulation territory infarcts of varying ages, recommend CTA HEAD and neck to exclude vertebral artery injury and further assess posterior cerebral artery's.   Ct Head Code Stroke Wo Contrast Result Date: 07/02/2017 IMPRESSION: 1. No acute finding. 2. Small bilateral occipital infarcts that have become apparent since 01/15/2017 but are chronic.   Echocardiogram:                                            Study  Conclusions - Left ventricle: The cavity size was moderately dilated. Systolic   function was severely reduced. The estimated ejection fraction   was in the range of 20% to 25%. Akinesis of the anterior and   apical myocardium. Features are consistent with a pseudonormal   left ventricular filling pattern, with concomitant abnormal   relaxation and increased filling pressure (grade 2 diastolic   dysfunction).  CTA Head/Neck:   IMPRESSION: 1. No emergent vascular finding. 2. Prominent noncalcified atherosclerotic plaque in the left subclavian artery with up to 40% stenosis. No superimposed ulceration. 3. Intracranial atherosclerosis along the bilateral carotid siphons with mild left paraclinoid ICA narrowing. Known high-grade right P2 segment stenosis. 4.  Emphysema                                                _____________________________________________________________________ ASSESSMENT: Mr. DAILY CRATE is a 46 y.o. male with PMH Polysubstance Abuse, Anxiety and Depression presenting with slurred speech, Left arm/leg weakness. CT head with no acute abnormalities but does show evidence of chronic bilateral occipital infracts. 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 CTA Head CTA head and neck confirmed high-grade right P2 stenosis and bilateral carotid siphon atherosclerosis.  TTE showed EF 20-25%.  LDL unable to calculate due to high TG at 539, A1c 6.0.  UDS showed positive cocaine and THC.  His cardiomyopathy may likely due to substance abuse. Recommend cardiology consultation for cardiomyopathy.  He is currently on aspirin and Lipitor 80 with fenofibrate.  Due to stroke with low EF, may consider Coumadin for stroke prevention, and repeat 2D echo in 3 months, if EF more than 30%, may consider to discontinue  Coumadin.   07/03/17: Patient continues to have Left eye vision loss, Left sided weakness, Left facial droop. Imaging thus far and lab results reviewed with patient and wife at bedside. Long discussion regarding stroke risk factors and lifestyle choices. Patient explains that he recently went to see an outpatient Neurologist Dr Lucia Gaskins on 06/18/17 for a complete stroke work up because of his family hx and he has been experiencing some H/A's and Left low quadrant vision loss x few months after experiencing a fall with head trauma. She ordered an MRI Brain schedule for 07/06/17 and a referral to outpatient Opthalmology. Awaiting results of CTA and  ECHO to make further recommendations. POC discussed with Dr Sharolyn DouglasEzenduka  07/04/17: Neuro exam remains stable. Left sided weakness unchanged.  Working with therapies.  Will likely be discharged to CIR once insurance approved.  Plan of care discussed with Dr. Cena BentonVega for recommended cardiology consult and decision on anticoagulation therapy. Patient will follow up with cardiology and neurology once discharged from CIR.  07/06/17:                         R/LHC 07/05/17  Mid RCA to Dist RCA lesion is 100% stenosed.  Dist Cx lesion is 100% stenosed.  Mid LAD lesion is 100% stenosed.  Prox Cx to Dist Cx lesion is 30% stenosed.  Neuro exam remains stable. Cardiology [facility].  Left sided weakness unchanged. CIR soon.   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 Stroke Risk Factors: family history, hyperlipidemia, smoking and polysubstance abuse Other Stroke Risk Factors: Cigarette smoker, Polysubstance Abuse, ETOH use, Fam hx  CVA  Outstanding Stroke Work-up Studies:      Workup is completed  PLAN  07/06/2017: Continue Aspirin/ Statin & Fenofibrate /Coumadin Ongoing aggressive stroke risk factor management Aggressive PT/OT/SLP CIR Consultation Case Management consultation for PCP Patient counseled to be compliant with his antithrombotic medications Follow up with Neurology in 6 weeks  HYPERTENSION: Stable Permissive hypertension (OK if <220/120) for 24-48 hours post stroke and then gradually normalized within 5-7 days. Long term BP goal normotensive. Home Meds: NONE  HYPERLIPIDEMIA:    Component Value Date/Time   CHOL 303 (H) 07/03/2017 0233   TRIG 539 (H) 07/03/2017 0233   HDL 31 (L) 07/03/2017 0233   CHOLHDL 9.8 07/03/2017 0233   VLDL UNABLE TO CALCULATE IF TRIGLYCERIDE OVER 400 mg/dL 40/98/119112/10/2016 [id number]   LDLCALC UNABLE TO CALCULATE IF TRIGLYCERIDE OVER 400 mg/dL 95/62/130812/10/2016 [id number]  Home Meds:  NONE LDL  goal < 70 Started on  Lipitor to 80 mg daily and Fenofibrate 160 mg daily Continue statin at discharge  R/O DIABETES: Lab Results  Component Value Date   HGBA1C 6.0 (H) 07/03/2017  HgbA1c goal < 7.0  TOBACCO ABUSE Current smoker Smoking cessation counseling provided Nicotine patch provided   ADHD Adderall 20 mg QID - attempt to decrease dose at discharge   Polysubstance Abuse Counseled to quit CIWA PRN Management per Medicine Team  Other Active Problems: Principal Problem:   CVA (cerebral vascular accident) (HCC) Active Problems:   Alcohol use   Anxiety   Depression   Acute hyperglycemia   Elevated blood-pressure reading without diagnosis of hypertension   ADHD   Cocaine abuse (HCC)   Myocardiopathy (HCC)   Mixed hyperlipidemia   Acute systolic heart failure [facility](HCC)  Hospital day # 4  VTE prophylaxis: Lovenox  Diet : Diet Heart Room service appropriate? Yes; Fluid consistency: Thin   Prior Home Stroke Medications: No antithrombotic Hospital Current Stroke Medications:   aspirin 325 mg daily and Lipitor 80 mg and Fenofibrate 160 mg Stroke New Meds Plan: Now on aspirin 325 mg daily and Lipitor 80 mg and Fenofibrate 160 mg Discharge Stroke Meds: Please discharge patient on: Aspirin 325 mg daily and Lipitor 80 mg and Fenofibrate 160 mg and Coumadin  Disposition: CIR - ins pending Therapy Recs:  CIR Follow Recs:  Follow-up Information    Anson Fret, MD. Schedule an appointment as soon as possible for a visit in 6 week(s).   Specialty:  Neurology Contact information: [address] [place] [address] [phone]          Patient, No Pcp Per- Case Management aware Follow up with Cardiology  FAMILY UPDATES: Wife at bedside  TEAM UPDATES: Penny Pia, MD  Brita Romp Stroke Neurology Team 07/06/2017 3:41 PM   Attending note: I reviewed above note and agree with the assessment and plan. I have made any additions or clarifications directly to the above note. Pt was seen and examined.  46 year old male with history of smoker, anxiety, substance abuse admitted for left sided weakness and slurred speech. MRI showed acute right pontine infarct, subacute right PCA infarct, and old left PCA infarct. MRA showed right P2 stenosis and left siphon atherosclerosis. CTA head and neck confirmed high-grade right P2 stenosis and bilateral carotid siphon atherosclerosis. TTE showed EF 20-25%. LDL unable to calculate due to high TG at 539, A1c 6.0. UDS showed positive cocaine and THC.His cardiomyopathy may likely due to substance abuse.cardiology consulted and cardiac cath showed multi-vessel occlusion. not candidate for revascularization procedure. MRI heart pending report.  Due to stroke with low EF, he is on Coumadin now with INR goal 2-3 for stroke prevention until EF > 30%.He is also on Lipitor 80with fenofibrate. Pending CIR. Educated on substance cessation. He is willing to do so.  Neurology will sign off. Please call with  questions. Pt will follow up with Dr. Lucia Gaskins at [facility] in about 6 weeks. Thanks for the consult.  Marvel Plan, MD PhD Stroke Neurology 07/06/2017 3:41 PM   To contact Stroke Continuity provider, please refer to [email or web address]. After hours, contact General Neurology

## 2017-07-06 NOTE — Progress Notes (Signed)
Pt admitted to 4M01 and is alert and oriented. Wife at bedside. Vitals are stable and within normal and pt complains of no pain. Pt educated on safety protocol and oriented to unit and call bell. Bed alarm in place. Continue plan of care.

## 2017-07-06 NOTE — Discharge Summary (Signed)
Physician Discharge Summary  Angel Davies:[id number] DOB: 1971-02-22 DOA: 07/02/2017  PCP: Patient, No Pcp Per  Admit date: 07/02/2017 Discharge date: 07/06/2017  Time spent: > 35 minutes  Recommendations for Outpatient Follow-up:  1. Please ensure patient follows up with Neurology and Cardiology 2. Started today on coumadin. Will need heparin to coumadin bridging that can be done by pharmacy while patient is in house.    Discharge Diagnoses:  Principal Problem:   CVA (cerebral vascular accident) [facility]) Active Problems:   Alcohol use   Anxiety   Depression   Acute hyperglycemia   Elevated blood-pressure reading without diagnosis of hypertension   ADHD   Cocaine abuse (HCC)   Myocardiopathy (HCC)   Mixed hyperlipidemia   Acute systolic heart failure [facility])   Discharge Condition: stable  Diet recommendation: Heart healthy  Filed Weights   07/02/17 1455 07/05/17 0500 07/06/17 0631  Weight: 87.5 kg (193 lb) 90.4 kg (199 lb 4.7 oz) 88 kg (194 lb 0.1 oz)    History of present illness:  HPI from Angel Silk NP on 07/02/17  Angel Davies a 45 y.o.malewith medical history significant forADHD, obesity, anxiety with depression. Patient presented via private vehicle with strokelike symptoms. Reportedly went to bed, woke up, was unable to bear weight on the left leg and at that time noticed not only left leg weakness but left-sided numbness, slurred speech and a left facial droop. Patient didn't seek any medical attention till later in the afternoon on 07/02/17. Patient admits to drinking half a box of wine and using cocaine the night before presentation. Pt also uses marijuana 4 times a week  Of note, pt had been seen by neurology Dr. Lucia Gaskins on 11/19 with a chief complaint of history of strokes in the family, hx of fall, last in June, which was preceded by vision loss in the left eye peripherally and was associated with headaches. An outpatient MRI of the brain with and  without contrast had been planned for 07/06/17. In the ED, CT of the head revealed small bilateral occipital infarcts. Neurology was consulted. A code stroke was called but pt was not a candidate for thrombolytic therapy  Hospital Course:  # Acute right pontine infarct Pt with LUE/LLE weakness, facial droop Strong positive FH of strokes, cocaine abuse, HLD, HTN - CT head demonstrates chronic bilateral occipital infarcts - MRI head: Acute 2.3 x 1.2 cm nonhemorrhagic RIGHT pontine infarct. Subacute small RIGHT   temporal occipital lobe/PCA territory infarct. Old LEFT occipital lobe/PCA territory infarct.  - CT angio head/neck: Prominent noncalcified atherosclerotic plaque in the left subclavian artery with    up to 40% stenosis. No superimposed ulceration. Intracranial atherosclerosis along the bilateral    carotid siphons with mild left paraclinoid ICA narrowing. Known high-grade right P2 segment    Stenosis - Echocardiogram: LVEF 20-25%, Grade II DD, +RWMA  - Telemetry, EKG SR - Lipid panel: TC 303, TG 539, LDL unable to calculate - W1X: 6, pre-diabetic - ASA 325mg , lipitor 80mg , fenofibrate 160mg  daily - Pt to transition to CIR with heparin to coumadin bridging. Would consult pharmacy to manage while patient is in house.  #HLD -Lipid panel: TC 303, TG 539, LDL unable to calculate -Lipitor + fenofibrate  #Newly diagnosed CHF -LVEF 20-25%, Grade II DD, +RWMA - Cardiology evaluated and performed [facility] Recommended the following: Acute systolic CHF due to ICM.  - Echo 07/02/17 with LVEF 20-25%, grade 2 DD, Trivial MR, Trivial TR - BPs ranging 90-110s.  - Add  spiro 12.5 qhs and follow pressures closely.  - Will add losartan as pressures tolerate and consider Entresto.  - Add digoxin 0.125 mg daily.  - Will need repeat echo 3 months after med optimization for ICD consideration.  - cMRI pending with EF decrease out of proportion to CAD.   #Pre-diabetes -A1c 6 -Follow up as outpt,  lifestyle modification advised  #hypertension -Medications as listed below.  #Polysubstance abuse +Cocaine, marijuana, tobacco abuse -UDS, + for cocaine, THC -Counseled regarding absolute cessation of both substances -Nicotine patch provided while patient was in house  #Alcohol use -Patient denies daily use of alcohol -Admitted to drinking one half of a box of wine on 07/02/17   #Anxiety/Depression -Continue home Klonopin and trazodone  #ADHD -Hold home Adderall on d/c  [facility]  Mid RCA to Dist RCA lesion is 100% stenosed.  Dist Cx lesion is 100% stenosed.  Mid LAD lesion is 100% stenosed.  Prox Cx to Dist Cx lesion is 30% stenosed.  Consultations:  Neurology  Cardiology: Dr. Lanna Pocheillery  Discharge Exam: Vitals:   07/06/17 1140 07/06/17 1446  BP:  122/88  Pulse: 90 (!) 107  Resp:  (!) 24  Temp:  98.1 F (36.7 C)  SpO2:  97%    General: Pt in nad, alert and awake Cardiovascular: rrr, no rubs Respiratory: no increased wob, no wheezes  Discharge Instructions   Discharge Instructions    Ambulatory referral to Neurology   Complete by:  As directed    An appointment is requested in approximately: 6-8 weeks   Call MD for:  difficulty breathing, headache or visual disturbances   Complete by:  As directed    Call MD for:  temperature >100.4   Complete by:  As directed    Diet - low sodium heart healthy   Complete by:  As directed    Discharge instructions   Complete by:  As directed    Pt needs to have pharmacy consulted for management of coumadin. Will need heparin to coumadin bridging.   Increase activity slowly   Complete by:  As directed      Allergies as of 07/06/2017   No Known Allergies     Medication List    STOP taking these medications   ADDERALL 20 MG tablet Generic drug:  amphetamine-dextroamphetamine     TAKE these medications   aspirin 81 MG chewable tablet Chew 1 tablet (81 mg total) by mouth daily. Start  taking on:  07/07/2017   atorvastatin 80 MG tablet Commonly known as:  LIPITOR Take 1 tablet (80 mg total) by mouth daily at 6 PM.   clonazePAM 1 MG tablet Commonly known as:  KLONOPIN Take 1 mg by mouth 3 (three) times daily as needed (jitters from adderall).   digoxin 0.125 MG tablet Commonly known as:  LANOXIN Take 1 tablet (0.125 mg total) by mouth daily. Start taking on:  07/07/2017   fenofibrate 160 MG tablet Take 1 tablet (160 mg total) by mouth daily. Start taking on:  07/07/2017   spironolactone 25 MG tablet Commonly known as:  ALDACTONE Take 0.5 tablets (12.5 mg total) by mouth at bedtime.   traZODone 50 MG tablet Commonly known as:  DESYREL Take 100-150 mg by mouth at bedtime as needed for sleep.   TYLENOL 500 MG tablet Generic drug:  acetaminophen Take 1,000 mg by mouth every 6 (six) hours as needed for headache (pain).      No Known Allergies Follow-up Information    Lucia Gaskinshern,  Griselda Miner, MD. Schedule an appointment as soon as possible for a visit in 6 week(s).   Specialty:  Neurology Contact information: [address] [place] [address] [phone]            The results of significant diagnostics from this hospitalization (including imaging, microbiology, ancillary and laboratory) are listed below for reference.    Significant Diagnostic Studies: Ct Angio Head W Or Wo Contrast  Result Date: 07/03/2017 CLINICAL DATA:  Stroke follow-up. EXAM: CT ANGIOGRAPHY HEAD AND NECK TECHNIQUE: Multidetector CT imaging of the head and neck was performed using the standard protocol during bolus administration of intravenous contrast. Multiplanar CT image reconstructions and MIPs were obtained to evaluate the vascular anatomy. Carotid stenosis measurements (when applicable) are obtained utilizing NASCET criteria, using the distal internal carotid diameter as the denominator. CONTRAST:  <See Chart> ISOVUE-370 IOPAMIDOL (ISOVUE-370) INJECTION 76%50 cc Isovue 370  intravenous COMPARISON:  Brain MRI and intracranial MRA from yesterday FINDINGS: CTA NECK FINDINGS Aortic arch: 3 vessel branching. Noncalcified atheromatous wall thickening. No acute finding or dilatation. Right carotid system: Questionable atheromatous wall thickening of the common carotid artery and ICA bulb. No ulceration, dissection, or beading. Left carotid system: No stenosis, ulceration, or beading. No definitive atherosclerosis. Vertebral arteries: Proximal subclavian noncalcified atherosclerosis with up to 40% stenosis. Right dominant vertebral artery. Limited visualization of distal left V1 segment due to neighboring streak artifact from intravenous contrast. No dissection or beading is visualized. Skeleton: No acute or aggressive finding Other neck: No noted mass or inflammation. Upper chest: Centrilobular emphysema. Review of the MIP images confirms the above findings CTA HEAD FINDINGS Anterior circulation: Atheromatous plaque on the carotid siphons that is mild. Mild narrowing at the left paraclinoid ICA segment. No branch occlusion or flow limiting stenosis. Negative for aneurysm; bulbous appearance of the left MCA bifurcation on thick MIPS is related to trifurcation. Reference 3D reformats. Posterior circulation: Dominant right vertebral artery. Most of left vertebral flow is into the left PICA. Dominant rightAICA. No branch occlusion. High-grade right P2 segment stenosis. Negative for aneurysm. Venous sinuses: Patent Anatomic variants: None significant Delayed phase: No abnormal intracranial enhancement. Known acute right pontine infarct. Known bilateral occipital infarcts. Review of the MIP images confirms the above findings IMPRESSION: 1. No emergent vascular finding. 2. Prominent noncalcified atherosclerotic plaque in the left subclavian artery with up to 40% stenosis. No superimposed ulceration. 3. Intracranial atherosclerosis along the bilateral carotid siphons with mild left paraclinoid ICA  narrowing. Known high-grade right P2 segment stenosis. 4.  Emphysema (ICD10-J43.9). Electronically Signed   By: Marnee Spring M.D.   On: 07/03/2017 15:03   Ct Angio Neck W Or Wo Contrast  Result Date: 07/03/2017 CLINICAL DATA:  Stroke follow-up. EXAM: CT ANGIOGRAPHY HEAD AND NECK TECHNIQUE: Multidetector CT imaging of the head and neck was performed using the standard protocol during bolus administration of intravenous contrast. Multiplanar CT image reconstructions and MIPs were obtained to evaluate the vascular anatomy. Carotid stenosis measurements (when applicable) are obtained utilizing NASCET criteria, using the distal internal carotid diameter as the denominator. CONTRAST:  <See Chart> ISOVUE-370 IOPAMIDOL (ISOVUE-370) INJECTION 76%50 cc Isovue 370 intravenous COMPARISON:  Brain MRI and intracranial MRA from yesterday FINDINGS: CTA NECK FINDINGS Aortic arch: 3 vessel branching. Noncalcified atheromatous wall thickening. No acute finding or dilatation. Right carotid system: Questionable atheromatous wall thickening of the common carotid artery and ICA bulb. No ulceration, dissection, or beading. Left carotid system: No stenosis, ulceration, or beading. No definitive atherosclerosis. Vertebral arteries: Proximal  subclavian noncalcified atherosclerosis with up to 40% stenosis. Right dominant vertebral artery. Limited visualization of distal left V1 segment due to neighboring streak artifact from intravenous contrast. No dissection or beading is visualized. Skeleton: No acute or aggressive finding Other neck: No noted mass or inflammation. Upper chest: Centrilobular emphysema. Review of the MIP images confirms the above findings CTA HEAD FINDINGS Anterior circulation: Atheromatous plaque on the carotid siphons that is mild. Mild narrowing at the left paraclinoid ICA segment. No branch occlusion or flow limiting stenosis. Negative for aneurysm; bulbous appearance of the left MCA bifurcation on thick MIPS is  related to trifurcation. Reference 3D reformats. Posterior circulation: Dominant right vertebral artery. Most of left vertebral flow is into the left PICA. Dominant rightAICA. No branch occlusion. High-grade right P2 segment stenosis. Negative for aneurysm. Venous sinuses: Patent Anatomic variants: None significant Delayed phase: No abnormal intracranial enhancement. Known acute right pontine infarct. Known bilateral occipital infarcts. Review of the MIP images confirms the above findings IMPRESSION: 1. No emergent vascular finding. 2. Prominent noncalcified atherosclerotic plaque in the left subclavian artery with up to 40% stenosis. No superimposed ulceration. 3. Intracranial atherosclerosis along the bilateral carotid siphons with mild left paraclinoid ICA narrowing. Known high-grade right P2 segment stenosis. 4.  Emphysema (ICD10-J43.9). Electronically Signed   By: Marnee SpringJonathon  Watts M.D.   On: 07/03/2017 15:03   Mr Brain Wo Contrast  Result Date: 07/02/2017 CLINICAL DATA:  Ataxia.  LEFT-sided numbness.  Suspect stroke. EXAM: MRI HEAD WITHOUT CONTRAST MRA HEAD WITHOUT CONTRAST TECHNIQUE: Multiplanar, multiecho pulse sequences of the brain and surrounding structures were obtained without intravenous contrast. Angiographic images of the head were obtained using MRA technique without contrast. COMPARISON:  CT HEAD July 02, 2017 at 1447 hours FINDINGS: MRI HEAD FINDINGS BRAIN: 2.3 x 1.2 cm reduced diffusion RIGHT pons with low ADC values. Faint reduced diffusion RIGHT temporal occipital lobe with T2 shine through and, intermediate FLAIR signal, minimal T1 shortening consistent with mineralization. Small area mesial LEFT occipital lobe encephalomalacia. No susceptibility artifact to suggest hemorrhage. No midline shift, mass effect or masses. Ventricles and sulci are normal for patient's age. No abnormal extra-axial fluid collections. VASCULAR: See below. SKULL AND UPPER CERVICAL SPINE: No abnormal sellar  expansion. No suspicious calvarial bone marrow signal. Craniocervical junction maintained. SINUSES/ORBITS: Trace paranasal sinus mucosal thickening. Mastoid air cells are well aerated. The included ocular globes and orbital contents are non-suspicious. OTHER: None. MRA HEAD FINDINGS ANTERIOR CIRCULATION: Normal flow related enhancement of the included cervical, petrous, cavernous and supraclinoid internal carotid arteries. Patent anterior communicating artery. Patent anterior and middle cerebral arteries, including distal segments. No large vessel occlusion, flow limiting stenosis, aneurysm. POSTERIOR CIRCULATION: RIGHT vertebral artery is dominant. Basilar artery is patent, with normal flow related enhancement of the main branch vessels. Proximal bilateral posterior cerebral artery's patent. Severe stenosis distal RIGHT P2 segment, moderate irregularity and poor flow related enhancement distal bilateral posterior cerebral artery's. No large vessel occlusion, aneurysm. ANATOMIC VARIANTS: None. Source images and MIP images were reviewed. IMPRESSION: MRI HEAD: 1. 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 2. Subacute small RIGHT temporal occipital lobe/PCA territory infarct. Old LEFT occipital lobe/PCA territory infarct. MRA HEAD: 1. No emergent large vessel occlusion. 2. Severe stenosis distal RIGHT P2 segment and, poor flow related enhancement distal posterior cerebral artery's. Given posterior circulation territory infarcts of varying ages, recommend CTA HEAD and neck to exclude vertebral artery injury and further assess posterior cerebral artery's. These results will be called to the ordering clinician  or representative by the Radiologist Assistant, and communication documented in the PACS or zVision Dashboard. Electronically Signed   By: Awilda Metro M.D.   On: 07/02/2017 21:13   Mr Cardiac Morphology W Wo Contrast  Result Date: 07/06/2017 CLINICAL DATA:  Ischemic Cardiomyopathy EXAM:  CARDIAC MRI TECHNIQUE: The patient was scanned on a 1.5 Tesla GE magnet. A dedicated cardiac coil was used. Functional imaging was done using Fiesta sequences. 2,3, and 4 chamber views were done to assess for RWMA's. Modified Simpson's rule using a short axis stack was used to calculate an ejection fraction on a dedicated work [employer]. The patient received 30 cc of Multihance. After 10 minutes inversion recovery sequences were used to assess for infiltration and scar tissue. CONTRAST:  30 cc Multihance FINDINGS: Atrial were normal in size. RV normal in size and function No pericardial effusion Normal AV and aortic root. Trivial MR. The LV was severely dilated. There was diffuse hypokinesis with akinesis of the septum and apex. There was a small apical thrombus measuring 11 mm x 8 mm. The quantitative EF was 18% (EDV 158 cc ESV 128 cc SV 28 cc) Delayed enhancement images showed full thickness scar involving the mid and distal septum and apex. There was subendocardial scar involving the basal inferior lateral wall. IMPRESSION: 1) Severe LVE with EF 18% septal and apical akinesis diffuse hypokinesis 2) Apical thrombus measuring 11 mm x 8 mm 3) Full thickness scar involving the mid and distal septum and apex Subendocardial scar involving the basal inferior lateral wall Charlton Haws Electronically Signed   By: Charlton Haws M.D.   On: 07/06/2017 15:57   Mr Maxine Glenn Head Wo Contrast  Result Date: 07/02/2017 CLINICAL DATA:  Ataxia.  LEFT-sided numbness.  Suspect stroke. EXAM: MRI HEAD WITHOUT CONTRAST MRA HEAD WITHOUT CONTRAST TECHNIQUE: Multiplanar, multiecho pulse sequences of the brain and surrounding structures were obtained without intravenous contrast. Angiographic images of the head were obtained using MRA technique without contrast. COMPARISON:  CT HEAD July 02, 2017 at 1447 hours FINDINGS: MRI HEAD FINDINGS BRAIN: 2.3 x 1.2 cm reduced diffusion RIGHT pons with low ADC values. Faint reduced  diffusion RIGHT temporal occipital lobe with T2 shine through and, intermediate FLAIR signal, minimal T1 shortening consistent with mineralization. Small area mesial LEFT occipital lobe encephalomalacia. No susceptibility artifact to suggest hemorrhage. No midline shift, mass effect or masses. Ventricles and sulci are normal for patient's age. No abnormal extra-axial fluid collections. VASCULAR: See below. SKULL AND UPPER CERVICAL SPINE: No abnormal sellar expansion. No suspicious calvarial bone marrow signal. Craniocervical junction maintained. SINUSES/ORBITS: Trace paranasal sinus mucosal thickening. Mastoid air cells are well aerated. The included ocular globes and orbital contents are non-suspicious. OTHER: None. MRA HEAD FINDINGS ANTERIOR CIRCULATION: Normal flow related enhancement of the included cervical, petrous, cavernous and supraclinoid internal carotid arteries. Patent anterior communicating artery. Patent anterior and middle cerebral arteries, including distal segments. No large vessel occlusion, flow limiting stenosis, aneurysm. POSTERIOR CIRCULATION: RIGHT vertebral artery is dominant. Basilar artery is patent, with normal flow related enhancement of the main branch vessels. Proximal bilateral posterior cerebral artery's patent. Severe stenosis distal RIGHT P2 segment, moderate irregularity and poor flow related enhancement distal bilateral posterior cerebral artery's. No large vessel occlusion, aneurysm. ANATOMIC VARIANTS: None. Source images and MIP images were reviewed. IMPRESSION: MRI HEAD: 1. 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 2. Subacute small RIGHT temporal occipital lobe/PCA territory infarct. Old LEFT occipital lobe/PCA territory infarct. MRA HEAD: 1. No emergent  large vessel occlusion. 2. Severe stenosis distal RIGHT P2 segment and, poor flow related enhancement distal posterior cerebral artery's. Given posterior circulation territory infarcts of varying ages,  recommend CTA HEAD and neck to exclude vertebral artery injury and further assess posterior cerebral artery's. These results will be called to the ordering clinician or representative by the Radiologist Assistant, and communication documented in the PACS or zVision Dashboard. Electronically Signed   By: Awilda Metro M.D.   On: 07/02/2017 21:13   Ct Head Code Stroke Wo Contrast  Result Date: 07/02/2017 CLINICAL DATA:  Code stroke. Left facial droop and weakness. Slurred speech. EXAM: CT HEAD WITHOUT CONTRAST TECHNIQUE: Contiguous axial images were obtained from the base of the skull through the vertex without intravenous contrast. COMPARISON:  01/05/2017 FINDINGS: Brain: No evidence of acute infarction, hemorrhage, hydrocephalus, extra-axial collection or mass lesion/mass effect. Chronic small bilateral occipital infarcts. Vascular: No hyperdense vessel. Skull: No acute or aggressive finding Sinuses/Orbits: Negative Other: These results were communicated to Dr. Laurence Slate at 07/02/2017 2:49 pmon 07/02/2017 by text page via the [facility] messaging system. Aspects is 10. IMPRESSION: 1. No acute finding. 2. Small bilateral occipital infarcts that have become apparent since 01/15/2017 but are chronic. Electronically Signed   By: Marnee Spring M.D.   On: 07/02/2017 15:04    Microbiology: Recent Results (from the past 240 hour(s))  Surgical pcr screen     Status: None   Collection Time: 07/05/17  8:07 AM  Result Value Ref Range Status   MRSA, PCR NEGATIVE NEGATIVE Final   Staphylococcus aureus NEGATIVE NEGATIVE Final    Comment: (NOTE) The Xpert SA Assay (FDA approved for NASAL specimens in patients 77 years of age and older), is one component of a comprehensive surveillance program. It is not intended to diagnose infection nor to guide or monitor treatment.      Labs: Basic Metabolic Panel: Recent Labs  Lab 07/02/17 1440 07/02/17 1448 07/03/17 0759 07/04/17 0250 07/05/17 0257 07/06/17 0318  NA  137 140 137 136 137 138  K 4.6 4.4 3.8 4.2 3.8 4.1  CL 106 106 105 102 101 106  CO2 19*  --  23 23 24 22   GLUCOSE 140* 153* 143* 113* 107* 103*  BUN 12 13 7 9 12 13   CREATININE 1.14 1.00 0.85 1.01 0.91 0.94  CALCIUM 9.6  --  9.4 9.4 9.5 9.6   Liver Function Tests: Recent Labs  Lab 07/02/17 1440  AST 32  ALT 34  ALKPHOS 83  BILITOT 0.6  PROT 7.9  ALBUMIN 4.4   No results for input(s): LIPASE, AMYLASE in the last 168 hours. No results for input(s): AMMONIA in the last 168 hours. CBC: Recent Labs  Lab 07/02/17 1440 07/02/17 1448 07/03/17 0759 07/04/17 0250 07/05/17 0257 07/06/17 0318  WBC 13.6*  --  12.6* 11.4* 11.6* 12.6*  NEUTROABS 9.3*  --  6.0 4.3 4.9 5.0  HGB 16.4 17.3* 16.4 16.4 16.5 16.4  HCT 48.4 51.0 48.7 49.7 49.8 49.0  MCV 91.5  --  91.4 92.6 91.2 92.1  PLT 310  --  281 282 311 300   Cardiac Enzymes: No results for input(s): CKTOTAL, CKMB, CKMBINDEX, TROPONINI in the last 168 hours. BNP: BNP (last 3 results) No results for input(s): BNP in the last 8760 hours.  ProBNP (last 3 results) No results for input(s): PROBNP in the last 8760 hours.  CBG: Recent Labs  Lab 07/02/17 1440 07/04/17 2103 07/05/17 0647  GLUCAP 147* 113* 115*  Signed:  Penny Pia MD.  [facility] 07/06/2017, 4:31 PM

## 2017-07-06 NOTE — Progress Notes (Addendum)
ADDENDUM:  Start IV heparin bridge (no bolus) for LV thrombus.  (see note below- Addendum)  ANTICOAGULATION CONSULT NOTE - Initial Consult (transfer to CIR) Pharmacy Consult for Coumadin Indication: stroke  No Known Allergies  Patient Measurements: Height: 5\' 9"  (175.3 cm) Weight: 190 lb 4.8 oz (86.3 kg) IBW/kg (Calculated) : 70.7 kg Heparin dosing weight: 86.3 kg (=total body weight)  Vital Signs: Temp: 98.1 F (36.7 C) (12/07 1446) Temp Source: Oral (12/07 1446) BP: 122/88 (12/07 1446) Pulse Rate: 107 (12/07 1446)  Labs: Recent Labs    07/04/17 0250 07/05/17 0257 07/06/17 0318  HGB 16.4 16.5 16.4  HCT 49.7 49.8 49.0  PLT 282 311 300  CREATININE 1.01 0.91 0.94    Estimated Creatinine Clearance: 104.5 mL/min (by C-G formula based on SCr of 0.94 mg/dL).   Medical History: Past Medical History:  Diagnosis Date  . ADHD (attention deficit hyperactivity disorder)   . Anxiety   . CVA (cerebral vascular accident) (HCC) 07/02/2017   "left sided weakness" (07/02/2017)  . Depression   . History of kidney stones    Assessment: 46yom s/p acute embolic stroke (didn't receive tPa) and found to have low EF. He will begin coumadin. Baseline INR 0.96. Coumadin score = 8. LFTs wnl. CBC wnl. No current drug interactions identified.   Prior to transfer to CIR, Dr.Vega requested IV heparin bridge. Noted that Dr. Gala RomneyBensimhon said therapeutic bridge not needed.  As I discussed with Dr. Roda ShuttersXu, Neurologist - appropriate to continue bridging with Aspirin 325 mg daily until INR at goal . Goal INR 2-3 . Okay to continue SQ heparin until INR 2-3.  This was discussed with Dr. Cena BentonVega, who agreed with plan.  Goal of Therapy:  INR 2-3 Monitor platelets by anticoagulation protocol: Yes   Plan:  1) Coumadin 10mg  tonight as previously ordered.  2) Daily INR 3) Coumadin book, video, education 4) D/C sq heparin once INR at goal (no therapeutic bridge needed per Dr. Gala RomneyBensimhon) 5) DC Aspirin once INR  at goal   Angel Davies, RPh Clinical Pharmacist 330p-1030p phone [phone] or [phone] Main pharmacy [phone] 07/06/2017,5:34 PM  _________________________________________________________________  ADDENDUM:   Start IV heparin bridge (no bolus) for LV thrombus. ANTICOAGULATION CONSULT NOTE - Initial Consult  Pharmacy Consult for IV Heparin/ Coumadin Indication: CVA and LV thrombus  Patient Measurements: Height: 5\' 9"  (175.3 cm) Weight: 190 lb 4.8 oz (86.3 kg) IBW/kg (Calculated) : 70.7 kg Heparin dosing weight: 86.3 kg (=total body weight)   Assessment: 46yom s/p acute embolic stroke (didn't receive tPa) and found to have low EF. Dr. Gala RomneyBensimhon has just ordered to start IV heparin no bolus for LV thrombus.  I called and discuss this with Dr. Kittie PlaterBensimhom- verified MRI result done today and revealed LV thrombus.   Goal: heparin level 0.3-0.5 (due to also acute CVA) INR = 2-3  PLAN:  Start IV heparin drip (no bolus) at rate of 1300 units/hr (15 units/kg/hr)  Check heparin level in 6 hours Daily heparin level and CBC. Coumadin has already been given tonight.  Discontinue Aspirin and SQ heparin.  --Clarify heparin level goal with cardiology in AM.  Angel Davies, RPh Clinical Pharmacist 8a-330p [phone] 330p-1030p phone [phone] or [phone] Main pharmacy [phone] 07/06/2017 7:13 PM

## 2017-07-06 NOTE — H&P (Signed)
Physical Medicine and Rehabilitation Admission H&P     Chief Complaint  Patient presents with  . Functional deficits due to stroke with left sided weakness and slurred speech.   HPI: Angel Davies is a 46 y.o. male with history of ADHD, anxiety, depression/anxiety, fall 12/2016 with loss of peripheral vision and onset of HA, polysubtance abuse who was admitted on 07/02/17 with fall, slurred speech and left sided weakness. Patient reported that he was drinking ETOH and used cocaine the night before--UDS positive for cocaine and THC. CT head negative. MRI/MRA brain done revealing " Acute RIGHT pontine infarct, Subacute small RIGHT temporal occipital lobe/PCA territory infarct and severe stenosis distal rigth P2 segment. CTA head/neck was negative for emergent vascular finding, 40% left SA stenosis due to plaque and emphysema. 2D echo showed EF 20-25% with moderately dilated LV, akinesis of anterior and apical myocardium with grade 2 diastolic dysfunction.  He was evaluated by Dr. Gala RomneyBensimhon and underwent cardiac cath on 12/6 revealing severe CAD with occluded mid LAD and well compensated filling pressures with moderately reduced cardiac output. Cardiac MRI ordered today for work up of decrease in EF out of proportion to CAD.  MRI reveals severe left ventricular dysfunction with ejection fraction of 18% and septal/apical akinesis with diffuse hypokinesis.  Also notable is an apical thrombus measuring 11 mm x 8 mm and a full-thickness scar involving the left mid and distal septum and apex.  Additionally a subendocardial scar involving the basal inferior lateral wall was seen.  Patient is to be treated medically with repeat echo in 3 months. He continues on heparin/coumadin bridge       Review of Systems  Constitutional: Negative for chills and fever.  HENT: Negative for hearing loss and tinnitus.  Eyes: Negative for blurred vision and double vision.  Cardiovascular: Negative for chest pain and  palpitations.  Gastrointestinal: Positive for constipation. Negative for heartburn and nausea.  Genitourinary: Negative for dysuria and urgency.  Musculoskeletal: Negative for back pain, myalgias and neck pain.  Skin: Negative for itching.  Neurological: Positive for dizziness, sensory change, speech change, focal weakness and weakness. Negative for headaches.  Psychiatric/Behavioral: Positive for substance abuse. The patient does not have insomnia.       Past Medical History:  Diagnosis Date  . ADHD (attention deficit hyperactivity disorder)   . Anxiety   . CVA (cerebral vascular accident) (HCC) 07/02/2017   "left sided weakness" (07/02/2017)  . Depression   . History of kidney stones         Past Surgical History:  Procedure Laterality Date  . CYSTOSCOPY WITH URETEROSCOPY, STONE BASKETRY AND STENT PLACEMENT    . RIGHT/LEFT HEART CATH AND CORONARY ANGIOGRAPHY N/A 07/05/2017   Procedure: RIGHT/LEFT HEART CATH AND CORONARY ANGIOGRAPHY; Surgeon: Dolores PattyBensimhon, Daniel R, MD; Location: [facility]; Service: Cardiovascular; Laterality: N/A;        Family History  Problem Relation Age of Onset  . Stroke Mother   . Colon cancer Mother   . Prostate cancer Father   . Seizures Neg Hx    Social History: Married. Independent and reports that he has been smoking cigarettes--1 PPD. He has a 30.00 pack-year smoking history. He has never used smokeless tobacco. He reports that he drinks about 2 glasses of wine daily. He denies use of illicit substances--did marijuana and cocaine once.  Allergies: No Known Allergies        Medications Prior to Admission  Medication Sig Dispense Refill  . acetaminophen (TYLENOL) 500 MG  tablet Take 1,000 mg by mouth every 6 (six) hours as needed for headache (pain).    Marland Kitchen. amphetamine-dextroamphetamine (ADDERALL) 20 MG tablet Take 20 mg by mouth 4 (four) times daily.     . clonazePAM (KLONOPIN) 1 MG tablet Take 1 mg by mouth 3 (three) times daily as needed  (jitters from adderall).     . traZODone (DESYREL) 50 MG tablet Take 100-150 mg by mouth at bedtime as needed for sleep.     Drug Regimen Review  Drug regimen was reviewed and remains appropriate with no significant issues identified  Home:  Home Living  Family/patient expects to be discharged to:: Private residence  Living Arrangements: Spouse/significant other  Available Help at Discharge: Family, Available 24 hours/day  Type of Home: House  Home Access: Stairs to enter  Entergy CorporationEntrance Stairs-Number of Steps: 1  Home Layout: Two level  Alternate Level Stairs-Number of Steps: flight  Bathroom Shower/Tub: Tub/shower unit, Teacher, musicCurtain  Bathroom Toilet: Standard  Bathroom Accessibility: Yes  Home Equipment: Tub bench  Additional Comments: pt also assists with his Dad as caregiver  Lives With: Spouse  Functional History:  Prior Function  Level of Independence: Independent  Comments: Warehouse work; usually desk work. driving  Functional Status:  Mobility:  Bed Mobility  Overal bed mobility: Needs Assistance  Bed Mobility: Supine to Sit  Rolling: Min guard  Sidelying to sit: Min assist  Supine to sit: Min guard  General bed mobility comments: increased time and effort pulling up on rails.  Transfers  Overall transfer level: Needs assistance  Equipment used: None  Transfers: Sit to/from Stand  Sit to Stand: Min assist  General transfer comment: cues for technique. Assist for safety and balance  Ambulation/Gait  Ambulation/Gait assistance: Mod assist  Ambulation Distance (Feet): 40 Feet(x 3; and 50')  Assistive device: 1 person hand held assist(and wall rail at times)  Gait Pattern/deviations: Step-to pattern, Step-through pattern, Shuffle, Decreased dorsiflexion - left, Ataxic  General Gait Details: cues for L foot placement due to tendency for ankle/foot supination. Cues for "step L, control knee, step right"; seated rest at times and stop for trunk extension and rebalanceing for  standing rest at times  Gait velocity: slow  Gait velocity interpretation: Below normal speed for age/gender   ADL:  ADL  Overall ADL's : Needs assistance/impaired  Eating/Feeding: Set up, Sitting  Grooming: Moderate assistance, Standing, Wash/dry hands  Grooming Details (indicate cue type and reason): for standing balance  Upper Body Bathing: Minimal assistance, Sitting, Cueing for compensatory techniques  Upper Body Bathing Details (indicate cue type and reason): Pt performing UB bathing while seated in recliner with Min A for back and cues for compensatory techniques due to weakness in LUE. Pt able to bring LUE to R axialry area with increased time and effort, pt reporting "It is hard to apply any pressure to clean under my arm."  Lower Body Bathing: Maximal assistance, Sit to/from stand  Upper Body Dressing : Minimal assistance, Sitting  Upper Body Dressing Details (indicate cue type and reason): Pt doffing/donning gown with Min A for managing gown and VCs for compensatory tehcniques. Donning LUE first.  Lower Body Dressing: Moderate assistance  Lower Body Dressing Details (indicate cue type and reason): to don socks in sitting  Toilet Transfer: Moderate assistance, Ambulation(HHA and gait belt)  Toilet Transfer Details (indicate cue type and reason): Simulated with functional mobility in room  Functional mobility during ADLs: Moderate assistance(HHA)  General ADL Comments: Encouraged functional use of LUE and purposeful  movement/exercises  Cognition:  Cognition  Overall Cognitive Status: Within Functional Limits for tasks assessed  Arousal/Alertness: Awake/alert  Orientation Level: Oriented X4  Attention: Alternating  Alternating Attention: Appears intact  Memory: Appears intact  Awareness: Appears intact  Problem Solving: Appears intact  Executive Function: Reasoning  Reasoning: Appears intact  Cognition  Arousal/Alertness: Awake/alert  Behavior During Therapy: WFL for tasks  assessed/performed  Overall Cognitive Status: Within Functional Limits for tasks assessed  Area of Impairment: Problem solving  Memory: Decreased short-term memory  Problem Solving: Requires verbal cues, Difficulty sequencing, Requires tactile cues  General Comments: pt emotional today stating "I don't want to be a burden"  Blood pressure 122/88, pulse (!) 107, temperature 98.1 F (36.7 C), temperature source Oral, resp. rate (!) 24, height 5\' 9"  (1.753 m), weight 88 kg (194 lb 0.1 oz), SpO2 97 %.  Physical Exam  Nursing note and vitals reviewed.  Constitutional: He is oriented to person, place, and time. He appears well-developed and well-nourished. No distress.  HENT:  Head: Normocephalic and atraumatic.  Eyes: Conjunctivae and EOM are normal. Pupils are equal, round, and reactive to light.  Neck: Normal range of motion. Neck supple.  Cardiovascular: Regular rhythm. Tachycardia present.  HR ranging from 110-120 at rest  Respiratory: Effort normal and breath sounds normal. No stridor. No respiratory distress. He has no wheezes.  GI: Soft. Bowel sounds are normal. He exhibits no distension. There is no tenderness.  Musculoskeletal: He exhibits no edema or tenderness.  Neurological: He is alert and oriented to person, place, and time.  Left facial weakness with mild dysarthria. Able to follow basic commands without difficulty. Left sided weakness noted. LUE grossly 2- out of 5 proximal to distal. Left lower extremity: Hip flexors 2+ knee extensors 3- ankle dorsiflexors and plantar flexors trace. Left heel cord is tight  Skin: Skin is warm and dry. He is not diaphoretic.  Psychiatric: He has a normal mood and affect. His speech is normal. Cognition and memory are normal. He expresses impulsivity.  Mood slightly depressed  Lab Results Last 48 Hours  Imaging Results (Last 48 hours)     Medical Problem List and Plan:  1. Functional deficits and left hemiparesis secondary to acute right pontine infarct/subacute right temporal/occipital infarcts  2. DVT Prophylaxis/Anticoagulation: Pharmaceutical: Coumadin with heparin bridge 3. Pain Management: tylenol prn  4. Mood: LCSW to follow for evaluation and support.  Will ask neuropsychology to-follow-up with the patient as he is expressing signs and symptoms of depression  5. Neuropsych: This patient is capable of making decisions on his own behalf.  6. Skin/Wound Care: Routine pressure relief measures.  7. Fluids/Electrolytes/Nutrition: Monitor I/O. Check lytes in am.  8. Severe CAD with CM: Treated medically with ASA, statin and  9. Acute systolic CHF: Was started spironolactone and digoxin on 12/7. Monitor BP and avoid hypotension/hypoperfusion.  10. Prediabetes: Hgb A1C- 6.0. Will have dietitian educate patient on CM diet--monitor BS for trends.  11. Leukocytosis:Monitor for signs of infection.  12. Polysubstance abuse: Has been counseled on importance of alcohol, cocaine and THC cessation  .  Post Admission Physician Evaluation:  1. Functional deficits secondary to right pontine  infarct as well as other subacute infarcts. 2. Patient is admitted to receive collaborative, interdisciplinary care between the physiatrist, rehab nursing staff, and therapy team. 3. Patient's level of medical complexity and substantial therapy needs in context of that medical necessity cannot be provided at a lesser intensity of care such as a SNF. 4. Patient has experienced substantial functional loss from his/her baseline which was documented above under the "Functional History" and "Functional Status" headings. Judging by the patient's diagnosis, physical exam, and functional history, the patient has potential for functional progress which will result in measurable gains while on inpatient rehab. These gains will be of substantial and practical use upon discharge in facilitating mobility and self-care at the household level. 5. Physiatrist will provide 24 hour management of medical needs as well as oversight of the therapy plan/treatment and provide guidance as appropriate regarding the interaction of the two. 6. The Preadmission Screening has been reviewed and patient status is unchanged unless otherwise stated above. 7. 24 hour rehab nursing will assist with bladder management, bowel management, safety, skin/wound care, disease management, medication administration, pain management and patient education and help integrate therapy concepts, techniques,education, etc. 8. PT will assess and treat for/with: Lower extremity strength, range of motion, stamina, balance, functional mobility, safety, adaptive techniques and equipment, neuromuscular reeducation, ego support, community reentry. Goals are: Modified independent to supervision. 9. OT will assess and treat for/with: ADL's, functional mobility, safety, upper extremity strength, adaptive techniques and equipment, neuromuscular reeducation, use support, family education. Goals are: Modified independent to supervision. Therapy may proceed with showering  this patient. 10. SLP will assess and treat for/with: Speech intelligibility, communication, swallowing. Goals are: Modified independent. 11. Case Management and Social Worker will assess and treat for psychological issues and discharge planning. 12. Team conference will be held weekly to assess progress toward goals and to determine barriers to discharge. 13. Patient will receive at least 3 hours of therapy per day at least 5 days per week. 14. ELOS: 13-18 days  15. Prognosis: excellent   Ranelle Oyster, MD, [facility]  [facility] Physical Medicine & Rehabilitation  07/06/2017  Jacquelynn Cree, Cordelia Poche  07/06/2017

## 2017-07-06 NOTE — Progress Notes (Signed)
ANTICOAGULATION CONSULT NOTE - Initial Consult  Pharmacy Consult for Coumadin Indication: stroke  No Known Allergies  Patient Measurements: Height: 5\' 9"  (175.3 cm) Weight: 194 lb 0.1 oz (88 kg) IBW/kg (Calculated) : 70.7  Vital Signs: Temp: 98 F (36.7 C) (12/07 0631) Temp Source: Oral (12/07 0631) BP: 108/80 (12/07 0631) Pulse Rate: 90 (12/07 1140)  Labs: Recent Labs    07/04/17 0250 07/05/17 0257 07/06/17 0318  HGB 16.4 16.5 16.4  HCT 49.7 49.8 49.0  PLT 282 311 300  CREATININE 1.01 0.91 0.94    Estimated Creatinine Clearance: 105.5 mL/min (by C-G formula based on SCr of 0.94 mg/dL).   Medical History: Past Medical History:  Diagnosis Date  . ADHD (attention deficit hyperactivity disorder)   . Anxiety   . CVA (cerebral vascular accident) (HCC) 07/02/2017   "left sided weakness" (07/02/2017)  . Depression   . History of kidney stones    Assessment: 46yom s/p acute embolic stroke (didn't receive tPa) and found to have low EF. He will begin coumadin. Baseline INR 0.96. Coumadin score = 8. LFTs wnl. CBC wnl. No current drug interactions identified.   Goal of Therapy:  INR 2-3 Monitor platelets by anticoagulation protocol: Yes   Plan:  1) Coumadin 10mg  tonight 2) Daily INR 3) Coumadin book, video, education 4) D/C sq heparin once INR at goal (no therapeutic bridge needed per Dr. Gala RomneyBensimhon)  Angel RumpfMarkle, Angel DienerJennifer Davies 07/06/2017,12:28 PM

## 2017-07-06 NOTE — Progress Notes (Signed)
I have insurance approval to admit pt to inpt rehab and bed available today. I have  contacted Dr. Vega andCena Davies will admit pt today. [phone]

## 2017-07-06 NOTE — Progress Notes (Signed)
Physical Therapy Treatment Patient Details Name: Claretha CooperJames D Burnsworth MRN: [id number] DOB: 08/05/1970 Today's Date: 07/06/2017    History of Present Illness 46 y.o. male with medical history significant for ADHD, obesity, anxiety with depression. Presenting with L-sided weakness/numbness, left facial droop, and slurred speech. MRI showing 2.3 x 1.2 cm reduced diffusion RIGHT pons and faint reduced diffusion RIGHT temporal occipital lobe.    PT Comments    Patient progressing with ambulation and L LE activation which improves with stretch to muscle.  Patient seems eager for transition to CIR and for progressing home when able.  Seems to have good understanding of plan and potential for recovery.  Agree with need for intensive comprehensive inpatient rehab prior to d/c home with wife assist.    Follow Up Recommendations  CIR     Equipment Recommendations  None recommended by PT    Recommendations for Other Services       Precautions / Restrictions Precautions Precautions: Fall    Mobility  Bed Mobility Overal bed mobility: Needs Assistance Bed Mobility: Supine to Sit     Supine to sit: Min guard     General bed mobility comments: increased time and effort pulling up on rails.  Transfers Overall transfer level: Needs assistance Equipment used: None Transfers: Sit to/from Stand Sit to Stand: Min assist         General transfer comment: cues for technique.  Assist for safety and balance  Ambulation/Gait Ambulation/Gait assistance: Mod assist Ambulation Distance (Feet): 40 Feet(x 3; and 50') Assistive device: 1 person hand held assist(and wall rail at times) Gait Pattern/deviations: Step-to pattern;Step-through pattern;Shuffle;Decreased dorsiflexion - left;Ataxic     General Gait Details: cues for L foot placement due to tendency for ankle/foot supination.  Cues for "step L, control knee, step right"; seated rest at times and stop for trunk extension and rebalanceing for  standing rest at times   Stairs            Wheelchair Mobility    Modified Rankin (Stroke Patients Only) Modified Rankin (Stroke Patients Only) Pre-Morbid Rankin Score: No symptoms Modified Rankin: Moderately severe disability     Balance Overall balance assessment: Independent   Sitting balance-Leahy Scale: Fair       Standing balance-Leahy Scale: Poor Standing balance comment: min A for standing balance                            Cognition Arousal/Alertness: Awake/alert Behavior During Therapy: WFL for tasks assessed/performed Overall Cognitive Status: Within Functional Limits for tasks assessed                                        Exercises Other Exercises Other Exercises: stand to squat x 5 min A for L knee control Other Exercises: seated heel slides for hamstring strength Other Exercises: attempted ankle DF/PF unable, but able to perform PF after quick stretch Other Exercises: educated wife in heel cord stretch L LE for ROM and she return demonstrated    General Comments        Pertinent Vitals/Pain Pain Assessment: No/denies pain    Home Living   Living Arrangements: Spouse/significant other Available Help at Discharge: Family;Available 24 hours/day           Additional Comments: pt also assists with his Dad as caregiver    Prior Function  PT Goals (current goals can now be found in the care plan section) Progress towards PT goals: Progressing toward goals    Frequency    Min 4X/week      PT Plan Current plan remains appropriate    Co-evaluation              AM-PAC PT "6 Clicks" Daily Activity  Outcome Measure  Difficulty turning over in bed (including adjusting bedclothes, sheets and blankets)?: A Little Difficulty moving from lying on back to sitting on the side of the bed? : Unable Difficulty sitting down on and standing up from a chair with arms (e.g., wheelchair, bedside  commode, etc,.)?: Unable Help needed moving to and from a bed to chair (including a wheelchair)?: A Little Help needed walking in hospital room?: A Lot Help needed climbing 3-5 steps with a railing? : Total 6 Click Score: 11    End of Session Equipment Utilized During Treatment: Gait belt Activity Tolerance: Patient tolerated treatment well Patient left: in chair;with family/visitor present;with call bell/phone within reach   PT Visit Diagnosis: Hemiplegia and hemiparesis;Other abnormalities of gait and mobility (R26.89);Other symptoms and signs involving the nervous system (R29.898) Hemiplegia - Right/Left: Left Hemiplegia - dominant/non-dominant: Dominant Hemiplegia - caused by: Cerebral infarction     Time: [id number] PT Time Calculation (min) (ACUTE ONLY): 28 min  Charges:  $Gait Training: 8-22 mins $Therapeutic Activity: 8-22 mins                    G CodesSheran Lawless:       Cyndi Shamiracle Gorden, [place] CarolinaPT [phone] 07/06/2017    Elray Mcgregorynthia Ethan Kasperski 07/06/2017, 2:29 PM

## 2017-07-07 ENCOUNTER — Inpatient Hospital Stay (HOSPITAL_COMMUNITY): Payer: Managed Care, Other (non HMO) | Admitting: Physical Therapy

## 2017-07-07 ENCOUNTER — Inpatient Hospital Stay (HOSPITAL_COMMUNITY): Payer: Managed Care, Other (non HMO) | Admitting: Speech Pathology

## 2017-07-07 ENCOUNTER — Inpatient Hospital Stay (HOSPITAL_COMMUNITY): Payer: Managed Care, Other (non HMO)

## 2017-07-07 DIAGNOSIS — I236 Thrombosis of atrium, auricular appendage, and ventricle as current complications following acute myocardial infarction: Secondary | ICD-10-CM

## 2017-07-07 DIAGNOSIS — R269 Unspecified abnormalities of gait and mobility: Secondary | ICD-10-CM

## 2017-07-07 DIAGNOSIS — I69398 Other sequelae of cerebral infarction: Secondary | ICD-10-CM

## 2017-07-07 LAB — COMPREHENSIVE METABOLIC PANEL
ALK PHOS: 78 U/L (ref 38–126)
ALT: 34 U/L (ref 17–63)
ANION GAP: 9 (ref 5–15)
AST: 26 U/L (ref 15–41)
Albumin: 3.8 g/dL (ref 3.5–5.0)
BUN: 12 mg/dL (ref 6–20)
CALCIUM: 9 mg/dL (ref 8.9–10.3)
CO2: 23 mmol/L (ref 22–32)
CREATININE: 0.93 mg/dL (ref 0.61–1.24)
Chloride: 104 mmol/L (ref 101–111)
Glucose, Bld: 134 mg/dL — ABNORMAL HIGH (ref 65–99)
Potassium: 3.8 mmol/L (ref 3.5–5.1)
SODIUM: 136 mmol/L (ref 135–145)
TOTAL PROTEIN: 6.7 g/dL (ref 6.5–8.1)
Total Bilirubin: 0.4 mg/dL (ref 0.3–1.2)

## 2017-07-07 LAB — CBC WITH DIFFERENTIAL/PLATELET
Basophils Absolute: 0 10*3/uL (ref 0.0–0.1)
Basophils Relative: 0 %
EOS ABS: 0.2 10*3/uL (ref 0.0–0.7)
EOS PCT: 2 %
HCT: 47.7 % (ref 39.0–52.0)
HEMOGLOBIN: 16.2 g/dL (ref 13.0–17.0)
LYMPHS ABS: 5.9 10*3/uL — AB (ref 0.7–4.0)
LYMPHS PCT: 48 %
MCH: 31 pg (ref 26.0–34.0)
MCHC: 34 g/dL (ref 30.0–36.0)
MCV: 91.4 fL (ref 78.0–100.0)
MONOS PCT: 10 %
Monocytes Absolute: 1.2 10*3/uL — ABNORMAL HIGH (ref 0.1–1.0)
Neutro Abs: 4.9 10*3/uL (ref 1.7–7.7)
Neutrophils Relative %: 40 %
Platelets: 292 10*3/uL (ref 150–400)
RBC: 5.22 MIL/uL (ref 4.22–5.81)
RDW: 13.5 % (ref 11.5–15.5)
WBC: 12.2 10*3/uL — ABNORMAL HIGH (ref 4.0–10.5)

## 2017-07-07 LAB — HEPARIN LEVEL (UNFRACTIONATED)
HEPARIN UNFRACTIONATED: 0.23 [IU]/mL — AB (ref 0.30–0.70)
HEPARIN UNFRACTIONATED: 0.33 [IU]/mL (ref 0.30–0.70)

## 2017-07-07 LAB — PROTIME-INR
INR: 1
Prothrombin Time: 13.1 seconds (ref 11.4–15.2)

## 2017-07-07 MED ORDER — CARVEDILOL 3.125 MG PO TABS
3.1250 mg | ORAL_TABLET | Freq: Two times a day (BID) | ORAL | Status: DC
  Administered 2017-07-07 – 2017-07-27 (×36): 3.125 mg via ORAL
  Filled 2017-07-07 (×40): qty 1

## 2017-07-07 MED ORDER — WARFARIN SODIUM 5 MG PO TABS
10.0000 mg | ORAL_TABLET | Freq: Once | ORAL | Status: AC
  Administered 2017-07-07: 10 mg via ORAL
  Filled 2017-07-07: qty 2

## 2017-07-07 MED ORDER — SORBITOL 70 % SOLN
30.0000 mL | Freq: Every day | Status: DC | PRN
  Filled 2017-07-07: qty 30

## 2017-07-07 MED ORDER — LOSARTAN POTASSIUM 25 MG PO TABS
25.0000 mg | ORAL_TABLET | Freq: Every day | ORAL | Status: DC
  Administered 2017-07-07 – 2017-07-27 (×20): 25 mg via ORAL
  Filled 2017-07-07 (×20): qty 1

## 2017-07-07 NOTE — Progress Notes (Signed)
Nutrition Education Note  RD consulted for nutrition education regarding a Heart Healthy diet.  Pt just educated by RD on 12/05 prior to transfer to Acute Inpatient Rehab.   Lipid Panel     Component Value Date/Time   CHOL 303 (H) 07/03/2017 0233   TRIG 539 (H) 07/03/2017 0233   HDL 31 (L) 07/03/2017 0233   CHOLHDL 9.8 07/03/2017 0233   VLDL UNABLE TO CALCULATE IF TRIGLYCERIDE OVER 400 mg/dL 16/10/960412/10/2016 [id number]   LDLCALC UNABLE TO CALCULATE IF TRIGLYCERIDE OVER 400 mg/dL 81/19/147812/10/2016 [id number]    RD reviewed Heart Healthy Nutrition Therapy. Pt with no questions at this time. Please refer to RD note on 12/05.    Expect fair compliance.  Body mass index is 28.1 kg/m.   Current diet order is Heart Healthy/Carb Mod, patient is consuming approximately 75-100% of meals at this time. Labs and medications reviewed. No further nutrition interventions warranted at this time. RD contact information provided. If additional nutrition issues arise, please re-consult RD.  Romelle Starcherate Tiffannie Sloss MS, RD, LDN, CNSC [phone] Pager  [phone] Weekend/On-Call Pager

## 2017-07-07 NOTE — Progress Notes (Signed)
Orthopedic Tech Progress Note Patient Details:  Angel CooperJames D Rumple 10/23/1970 [id number]  Patient ID: Angel Davies, male   DOB: 09/01/1970, 46 y.o.   MRN: [id number]   Angel Davies, Angel Davies 07/07/2017, 10:55 AM Called in hanger brace order; spoke with answering service

## 2017-07-07 NOTE — Progress Notes (Signed)
ANTICOAGULATION CONSULT NOTE - Follow Up Consult  Pharmacy Consult for Heparin  Indication: stroke and LV thrombus  No Known Allergies  Patient Measurements: Height: 5\' 9"  (175.3 cm) Weight: 190 lb 4.8 oz (86.3 kg) IBW/kg (Calculated) : 70.7  Vital Signs: Temp: 98.7 F (37.1 C) (12/07 1756) Temp Source: Oral (12/07 1756) BP: 115/80 (12/07 1756) Pulse Rate: 107 (12/07 1756)  Labs: Recent Labs    07/05/17 0257 07/06/17 0318 07/07/17 0223 07/07/17 0225  HGB 16.5 16.4 16.2  --   HCT 49.8 49.0 47.7  --   PLT 311 300 292  --   LABPROT  --   --  13.1  --   INR  --   --  1.00  --   HEPARINUNFRC  --   --   --  0.23*  CREATININE 0.91 0.94 0.93  --     Estimated Creatinine Clearance: 105.7 mL/min (by C-G formula based on SCr of 0.93 mg/dL).  Assessment: 46 y/o M s/p acute embolic stroke, also found to have LV thrombus, on heparin/warfarin bridge, initial heparin level is low this AM, no issues per RN.   Goal of Therapy:  Heparin level 0.3-0.5 units/mL Monitor platelets by anticoagulation protocol: Yes   Plan:  -Inc heparin to 1400 units/hr -1200 HL  Cattaleya Wien, [place] 07/07/2017,3:34 AM

## 2017-07-07 NOTE — Evaluation (Signed)
Speech Language Pathology Assessment and Plan  Patient Details  Name: Angel Davies MRN: [id number] Date of Birth: [birth year]  SLP Diagnosis: Dysarthria  Rehab Potential: Excellent ELOS: ~1 week for SLP    Today's Date: 07/07/2017 SLP Individual Time: 1055-1150 SLP Individual Time Calculation (min): 55 min   Problem List:  Patient Active Problem List   Diagnosis Date Noted  . Right pontine stroke ([place]) 07/06/2017  . Left hemiparesis ([place])   . Cardiomyopathy, ischemic   . LV (left ventricular) mural thrombus following MI ([place])   . Acute systolic heart failure ([place])   . Myocardiopathy ([place])   . Mixed hyperlipidemia   . CVA (cerebral vascular accident) ([place]) 07/02/2017  . Alcohol use 07/02/2017  . Anxiety 07/02/2017  . Depression 07/02/2017  . Acute hyperglycemia 07/02/2017  . Elevated blood-pressure reading without diagnosis of hypertension 07/02/2017  . ADHD 07/02/2017  . Cocaine abuse ([place]) 07/02/2017  . Marijuana abuse 06/20/2017  . Cocaine use 06/20/2017   Past Medical History:  Past Medical History:  Diagnosis Date  . ADHD (attention deficit hyperactivity disorder)   . Anxiety   . CVA (cerebral vascular accident) ([place]) 07/02/2017   "left sided weakness" (07/02/2017)  . Depression   . History of kidney stones    Past Surgical History:  Past Surgical History:  Procedure Laterality Date  . CYSTOSCOPY WITH URETEROSCOPY, STONE BASKETRY AND STENT PLACEMENT    . RIGHT/LEFT HEART CATH AND CORONARY ANGIOGRAPHY N/A 07/05/2017   Procedure: RIGHT/LEFT HEART CATH AND CORONARY ANGIOGRAPHY;  Surgeon: Jolaine Artist, MD;  Location: [facility];  Service: Cardiovascular;  Laterality: N/A;    Assessment / Plan / Recommendation Clinical Impression is a 46 y.o. male with history of ADHD, anxiety, depression/anxiety, fall 12/2016 with loss of peripheral vision and onset of HA, polysubtance abuse who was admitted on 07/02/17 with fall, slurred speech and left sided  weakness. Patient reported that he was drinking ETOH and used cocaine the night before--UDS positive for cocaine and THC. CT head negative. MRI/MRA brain done revealing " Acute RIGHT pontine infarct, Subacute small RIGHT temporal occipital lobe/PCA territory infarct and severe stenosis distal rigth P2 segment. CTA head/neck was negative for emergent vascular finding, 40% left SA stenosis due to plaque and emphysema. 2D echo showed EF 20-25% with moderately dilated LV, akinesis of anterior and apical myocardium with grade 2 diastolic dysfunction. He was evaluated by Dr. Haroldine Laws and underwent cardiac cath on 12/6 revealing severe CAD with occluded mid LAD and well compensated filling pressures with moderately reduced cardiac output. Cardiac MRI ordered today for work up of decrease in EF out of proportion to CAD.  MRI reveals severe left ventricular dysfunction with ejection fraction of 18% and septal/apical akinesis with diffuse hypokinesis.  Also notable is an apical thrombus measuring 11 mm x 8 mm and a full-thickness scar involving the left mid and distal septum and apex.  Additionally a subendocardial scar involving the basal inferior lateral wall was seen. Patient was transferred to [facility] 07/06/17.   Patient administered the MoCA (version 7.3) and scored 27/30 points with a score of 26 or above considered normal. Throughout evaluation, patient appeared distracted by multiple family members and was somewhat tangential. However, patient has a h/o ADHD and patient's family reports they have not noticed any cognitive or personality changes since hospitalization. Patient did demonstrate mild dysarthria characterized by imprecise consonants due to impaired coordination with mild weakness and an increased speech rate. Patient requested SLP services to maximize speech intelligibility  due to using the phone frequently for his job. Patient would benefit from skilled SLP intervention to maximize his speech intelligibility  prior to discharge.       Skilled Therapeutic Interventions          Administered a cognitive-linguistic evaluation. Please see above for details. Patient and his family education on current function and goals of skilled SLP intervention. All verbalized understanding and agreement.   SLP Assessment  Patient will need skilled [place] [facility] Services during CIR admission    Recommendations  Oral Care Recommendations: Oral care BID Recommendations for Other Services: Neuropsych consult Patient destination: Home Follow up Recommendations: None Equipment Recommended: None recommended by SLP    SLP Frequency 1 to 3 out of 7 days   SLP Duration  SLP Intensity  SLP Treatment/Interventions ~1 week for SLP  Minumum of 1-2 x/day, 30 to 90 minutes  Speech/Language facilitation;Cueing hierarchy;Functional tasks;Patient/family education;Therapeutic Activities;Environmental controls    Pain No/Denies Pain  Function:    Cognition Comprehension Comprehension assist level: Follows complex conversation/direction with extra time/assistive device  Expression   Expression assist level: Expresses basic 90% of the time/requires cueing < 10% of the time.  Social Interaction Social Interaction assist level: Interacts appropriately with others with medication or extra time (anti-anxiety, antidepressant).  Problem Solving Problem solving assist level: Solves basic problems with no assist  Memory Memory assist level: More than reasonable amount of time   Short Term Goals: Week 1: SLP Short Term Goal 1 (Week 1): STGs=LTGs  Refer to Care Plan for Long Term Goals  Recommendations for other services: Neuropsych  Discharge Criteria: Patient will be discharged from SLP if patient refuses treatment 3 consecutive times without medical reason, if treatment goals not met, if there is a change in medical status, if patient makes no progress towards goals or if patient is discharged from  hospital.  The above assessment, treatment plan, treatment alternatives and goals were discussed and mutually agreed upon: by patient and by family  Rivaldo Hineman 07/07/2017, 12:43 PM

## 2017-07-07 NOTE — Evaluation (Signed)
Occupational Therapy Assessment and Plan  Patient Details  Name: Angel Davies MRN: [id number] Date of Birth: Mar 23, 1971  OT Diagnosis: abnormal posture, ataxia, cognitive deficits, hemiplegia affecting non-dominant side, muscle weakness (generalized) and swelling of limb Rehab Potential: Rehab Potential (ACUTE ONLY): Excellent ELOS: 10-14   Today's Date: 07/07/2017 OT Individual Time: 1300-1400 OT Individual Time Calculation (min): 60 min     Problem List:  Patient Active Problem List   Diagnosis Date Noted  . 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 Left hemiparesis ([place])   . Cardiomyopathy, ischemic   . LV (left ventricular) mural thrombus following MI ([place])   . Acute systolic heart failure ([place])   . Myocardiopathy ([place])   . Mixed hyperlipidemia   . CVA (cerebral vascular accident) ([place]) 07/02/2017  . Alcohol use 07/02/2017  . Anxiety 07/02/2017  . Depression 07/02/2017  . Acute hyperglycemia 07/02/2017  . Elevated blood-pressure reading without diagnosis of hypertension 07/02/2017  . ADHD 07/02/2017  . Cocaine abuse ([place]) 07/02/2017  . Marijuana abuse 06/20/2017  . Cocaine use 06/20/2017    Past Medical History:  Past Medical History:  Diagnosis Date  . ADHD (attention deficit hyperactivity disorder)   . Anxiety   . CVA (cerebral vascular accident) ([place]) 07/02/2017   "left sided weakness" (07/02/2017)  . Depression   . History of kidney stones    Past Surgical History:  Past Surgical History:  Procedure Laterality Date  . CYSTOSCOPY WITH URETEROSCOPY, STONE BASKETRY AND STENT PLACEMENT    . RIGHT/LEFT HEART CATH AND CORONARY ANGIOGRAPHY N/A 07/05/2017   Procedure: RIGHT/LEFT HEART CATH AND CORONARY ANGIOGRAPHY;  Surgeon: Jolaine Artist, MD;  Location: [facility];  Service: Cardiovascular;  Laterality: N/A;    Assessment & Plan Clinical Impression: Angel Davies is a 46 y.o. male with history of ADHD, anxiety, depression/anxiety, fall 12/2016 with  loss of peripheral vision and onset of HA, polysubtance abuse who was admitted on 07/02/17 with fall, slurred speech and left sided weakness. Patient reported that he was drinking ETOH and used cocaine the night before--UDS positive for cocaine and THC. CT head negative. MRI/MRA brain done revealing " Acute RIGHT pontine infarct, Subacute small RIGHT temporal occipital lobe/PCA territory infarct and severe stenosis distal rigth P2 segment. CTA head/neck was negative for emergent vascular finding, 40% left SA stenosis due to plaque and emphysema. 2D echo showed EF 20-25% with moderately dilated LV, akinesis of anterior and apical myocardium with grade 2 diastolic dysfunction.  He was evaluated by Dr. Haroldine Laws and underwent cardiac cath on 12/6 revealing severe CAD with occluded mid LAD and well compensated filling pressures with moderately reduced cardiac output. Cardiac MRI ordered today for work up of decrease in EF out of proportion to CAD.  MRI reveals severe left ventricular dysfunction with ejection fraction of 18% and septal/apical akinesis with diffuse hypokinesis.  Also notable is an apical thrombus measuring 11 mm x 8 mm and a full-thickness scar involving the left mid and distal septum and apex.  Additionally a subendocardial scar involving the basal inferior lateral wall was seen.  Patient is to be treated medically with repeat echo in 3 months. He continues on heparin/coumadin bridge    Patient currently requires min- mod with basic self-care skills secondary to muscle weakness, decreased cardiorespiratoy endurance, impaired timing and sequencing, unbalanced muscle activation, ataxia, decreased coordination and decreased motor planning, decreased attention to left, decreased problem solving and decreased safety awareness and decreased standing balance, decreased postural control, hemiplegia and decreased  balance strategies.  Prior to hospitalization, patient could complete BADL/IADL with independent  .  Patient will benefit from skilled intervention to increase independence with basic self-care skills prior to discharge home with care partner.  Anticipate patient will require intermittent supervision and follow up home health.  OT - End of Session Activity Tolerance: Tolerates 30+ min activity with multiple rests Endurance Deficit: Yes OT Assessment Rehab Potential (ACUTE ONLY): Excellent OT Barriers to Discharge: Decreased caregiver support OT Barriers to Discharge Comments: wife works OT Patient demonstrates impairments in the following area(s): Balance;Behavior;Safety;Perception;Endurance;Motor;Vision OT Basic ADL's Functional Problem(s): Grooming;Bathing;Dressing;Toileting OT Advanced ADL's Functional Problem(s): Simple Meal Preparation OT Transfers Functional Problem(s): Toilet;Tub/Shower OT Additional Impairment(s): Fuctional Use of Upper Extremity OT Plan OT Intensity: Minimum of 1-2 x/day, 45 to 90 minutes OT Frequency: 5 out of 7 days OT Duration/Estimated Length of Stay: 10-14 OT Treatment/Interventions: Balance/vestibular training;Discharge planning;Functional electrical stimulation;Pain management;Self Care/advanced ADL retraining;Therapeutic Activities;UE/LE Coordination activities;Functional mobility training;Disease mangement/prevention;Patient/family education;Therapeutic Exercise;Visual/perceptual remediation/compensation;Community reintegration;DME/adaptive equipment instruction;Neuromuscular re-education;Psychosocial support;Splinting/orthotics;UE/LE Strength taining/ROM;Wheelchair propulsion/positioning OT Self Feeding Anticipated Outcome(s): MOD I OT Basic Self-Care Anticipated Outcome(s): MOD I OT Toileting Anticipated Outcome(s): MOD I OT Bathroom Transfers Anticipated Outcome(s): MOD I toilet; S shower OT Recommendation Patient destination: Home Follow Up Recommendations: Home health OT Equipment Recommended: To be determined Equipment Details: pt reports  having TTB from father   Skilled Therapeutic Intervention 1;1. RN present throughout beginning of session to hook up heparin IV. Pt dons pullover shirt with min A to thread LLE and Vc for hemi technique/push sleeve past elbow. OT provides steadying A to thread LLE into pants and min A for sit to stand as pt advances pants past hips. Pt unable to fasten pants 2/2 decreased FMC/hemiplegia. Pt sits at sink to shave with electric razor with set up. Pt stands iwht min A for balance while brushing teeth. Pt completes toilet transfer onto toilet with min A and VC for safety awareness as pt slightly impulsive with mild L inattention to L side of body/ankle supination. Pt able to complete clothing management with min-mod A for balance. Pt educated on TTB use and completes transfer with min A and Vc for sequencing. Pt stands at high/low table with min A for balance and tactile cue to relax shoulder hike as pt attempts gross grasp/release of pegs to place in bin.  Exited session with pt seated in bed with exit alarm on and family in room  OT Evaluation Precautions/Restrictions  Precautions Precautions: Fall Restrictions Weight Bearing Restrictions: No General Chart Reviewed: Yes Vital Signs   Pain Pain Assessment Pain Assessment: No/denies pain Home Living/Prior Functioning Home Living Family/patient expects to be discharged to:: Private residence Living Arrangements: Spouse/significant other Available Help at Discharge: Family, Available 24 hours/day Type of Home: House Home Access: Stairs to enter [employer] of Steps: 2 Entrance Stairs-Rails: None Home Layout: Two level Alternate Level Stairs-Number of Steps: flight Alternate Level Stairs-Rails: None Bathroom Shower/Tub: Government social research officer Accessibility: Yes Additional Comments: pt also assists with his Dad as caregiver  Lives With: Spouse, Family Prior Function Level of Independence:  Independent with basic ADLs, Independent with gait  Able to Take Stairs?: Yes Driving: Yes Vocation: Full time employment Comments: Warehouse work; usually desk work. driving. Rn is at [employer] the day to assist father.  ADL   Vision Baseline Vision/History: No visual deficits Patient Visual Report: Blurring of vision;Other (comment) Vision Assessment?: Vision impaired- to be further tested in functional context;Yes Eye Alignment: Within Functional Limits Perception  Perception: Impaired(mild L body inattention) Praxis Praxis: Intact Cognition Overall Cognitive Status: Within Functional Limits for tasks assessed Arousal/Alertness: Awake/alert Year: 2018 Month: December Day of Week: Correct Memory: Appears intact Immediate Memory Recall: Sock;Blue;Bed Memory Recall: Bed;Blue;Sock Memory Recall Sock: With Cue Memory Recall Blue: Without Cue Memory Recall Bed: With Cue Awareness: Appears intact Problem Solving: Appears intact Behaviors: Impulsive;Lability Safety/Judgment: Appears intact Comments: Impulsive. Labile.  Sensation Sensation Light Touch: Appears Intact Proprioception: Impaired by gross assessment Additional Comments: mild ataxia in the LLE Motor  Motor Motor: Hemiplegia;Ataxia Motor - Skilled Clinical Observations: L sided hemi plegia with mild ataxia Mobility  Transfers Transfers: Sit to Stand;Stand to Sit Sit to Stand: 4: Min guard Stand to Sit: 4: Min guard  Trunk/Postural Assessment  Cervical Assessment Cervical Assessment: Within Functional Limits Thoracic Assessment Thoracic Assessment: Within Functional Limits Lumbar Assessment Lumbar Assessment: Exceptions to [facility] Postural Control Postural Control: Deficits on evaluation(mild posterior lean and delayed/absent righting reactions on L)  Balance Balance Balance Assessed: Yes Static Sitting Balance Static Sitting - Level of Assistance: 6: Modified independent (Device/Increase time) Dynamic  Sitting Balance Dynamic Sitting - Level of Assistance: 5: Stand by assistance Sitting balance - Comments: pt with posterior lean when performing LE seated ther ex Static Standing Balance Static Standing - Level of Assistance: 4: Min assist Dynamic Standing Balance Dynamic Standing - Level of Assistance: 3: Mod assist Extremity/Trunk Assessment RUE Assessment RUE Assessment: Within Functional Limits LUE Assessment LUE Assessment: Exceptions to WFL(min ranges in shoulder/elbow, trace opposition in thumb/digits; ataxic )   See Function Navigator for Current Functional Status.   Refer to Care Plan for Long Term Goals  Recommendations for other services: Therapeutic Recreation  Pet therapy, Kitchen group, Stress management and Outing/community reintegration   Discharge Criteria: Patient will be discharged from OT if patient refuses treatment 3 consecutive times without medical reason, if treatment goals not met, if there is a change in medical status, if patient makes no progress towards goals or if patient is discharged from hospital.  The above assessment, treatment plan, treatment alternatives and goals were discussed and mutually agreed upon: by patient  Tonny Branch 07/07/2017, 2:02 PM

## 2017-07-07 NOTE — Progress Notes (Signed)
Subjective/Complaints: No issues overnite, anxious to start rehab  ROS-  No CP, SOB, N/V/D  Objective: Vital Signs: Blood pressure 110/70, pulse 80, temperature 98.1 F (36.7 C), temperature source Oral, resp. rate 16, height '5\' 9"'$  (1.753 m), weight 86.3 kg (190 lb 4.8 oz), SpO2 96 %. Mr Cardiac Morphology W Wo Contrast  Result Date: 07/06/2017 CLINICAL DATA:  Ischemic Cardiomyopathy EXAM: CARDIAC MRI TECHNIQUE: The patient was scanned on a 1.5 Tesla GE magnet. A dedicated cardiac coil was used. Functional imaging was done using Fiesta sequences. 2,3, and 4 chamber views were done to assess for RWMA's. Modified Simpson's rule using a short axis stack was used to calculate an ejection fraction on a dedicated work Conservation officer, nature. The patient received 30 cc of Multihance. After 10 minutes inversion recovery sequences were used to assess for infiltration and scar tissue. CONTRAST:  30 cc Multihance FINDINGS: Atrial were normal in size. RV normal in size and function No pericardial effusion Normal AV and aortic root. Trivial MR. The LV was severely dilated. There was diffuse hypokinesis with akinesis of the septum and apex. There was a small apical thrombus measuring 11 mm x 8 mm. The quantitative EF was 18% (EDV 158 cc ESV 128 cc SV 28 cc) Delayed enhancement images showed full thickness scar involving the mid and distal septum and apex. There was subendocardial scar involving the basal inferior lateral wall. IMPRESSION: 1) Severe LVE with EF 18% septal and apical akinesis diffuse hypokinesis 2) Apical thrombus measuring 11 mm x 8 mm 3) Full thickness scar involving the mid and distal septum and apex Subendocardial scar involving the basal inferior lateral wall [place] Electronically Signed   By: Jenkins Rouge M.D.   On: 07/06/2017 15:57   Results for orders placed or performed during the hospital encounter of 07/06/17 (from the past 72 hour(s))  Protime-INR     Status: None   Collection Time: 07/07/17  2:23 AM  Result Value Ref Range   Prothrombin Time 13.1 11.4 - 15.2 seconds   INR 1.00   CBC WITH DIFFERENTIAL     Status: Abnormal   Collection Time: 07/07/17  2:23 AM  Result Value Ref Range   WBC 12.2 (H) 4.0 - 10.5 K/uL   RBC 5.22 4.22 - 5.81 MIL/uL   Hemoglobin 16.2 13.0 - 17.0 g/dL   HCT 47.7 39.0 - 52.0 %   MCV 91.4 78.0 - 100.0 fL   MCH 31.0 26.0 - 34.0 pg   MCHC 34.0 30.0 - 36.0 g/dL   RDW 13.5 11.5 - 15.5 %   Platelets 292 150 - 400 K/uL   Neutrophils Relative % 40 %   Neutro Abs 4.9 1.7 - 7.7 K/uL   Lymphocytes Relative 48 %   Lymphs Abs 5.9 (H) 0.7 - 4.0 K/uL   Monocytes Relative 10 %   Monocytes Absolute 1.2 (H) 0.1 - 1.0 K/uL   Eosinophils Relative 2 %   Eosinophils Absolute 0.2 0.0 - 0.7 K/uL   Basophils Relative 0 %   Basophils Absolute 0.0 0.0 - 0.1 K/uL  Comprehensive metabolic panel     Status: Abnormal   Collection Time: 07/07/17  2:23 AM  Result Value Ref Range   Sodium 136 135 - 145 mmol/L   Potassium 3.8 3.5 - 5.1 mmol/L   Chloride 104 101 - 111 mmol/L   CO2 23 22 - 32 mmol/L   Glucose, Bld 134 (H) 65 - 99 mg/dL   BUN 12 6 - 20  mg/dL   Creatinine, Ser 0.93 0.61 - 1.24 mg/dL   Calcium 9.0 8.9 - 10.3 mg/dL   Total Protein 6.7 6.5 - 8.1 g/dL   Albumin 3.8 3.5 - 5.0 g/dL   AST 26 15 - 41 U/L   ALT 34 17 - 63 U/L   Alkaline Phosphatase 78 38 - 126 U/L   Total Bilirubin 0.4 0.3 - 1.2 mg/dL   GFR calc non Af Amer >60 >60 mL/min   GFR calc Af Amer >60 >60 mL/min    Comment: (NOTE) The eGFR has been calculated using the CKD EPI equation. This calculation has not been validated in all clinical situations. eGFR's persistently <60 mL/min signify possible Chronic Kidney Disease.    Anion gap 9 5 - 15  Heparin level (unfractionated)     Status: Abnormal   Collection Time: 07/07/17  2:25 AM  Result Value Ref Range   Heparin Unfractionated 0.23 (L) 0.30 - 0.70 IU/mL    Comment:        IF HEPARIN RESULTS ARE BELOW EXPECTED  VALUES, AND PATIENT DOSAGE HAS BEEN CONFIRMED, SUGGEST FOLLOW UP TESTING OF ANTITHROMBIN III LEVELS.      HEENT: normal Cardio: RRR and no murmur Resp: CTA B/L and unlabored GI: BS positive and NT, ND Extremity:  Pulses positive and No Edema Skin:   Intact and Other IV site ok Neuro: Alert/Oriented, Normal Sensory, Abnormal Motor 3- left delt bi tri FF, 3+ L HF, KE 0 ADF and Abnormal FMC Ataxic/ dec FMC Musc/Skel:  Normal Gen NAD   Assessment/Plan: 1. 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 Physiatrist is providing close team supervision and 24 hour management of active medical problems listed below. Physiatrist and rehab team continue to assess barriers to discharge/monitor patient progress toward functional and medical goals. FIM:       Function - Toileting Toileting steps completed by patient: Performs perineal hygiene Toileting steps completed by helper: Adjust clothing prior to toileting, Adjust clothing after toileting Toileting Assistive Devices: Grab bar or rail Assist level: Touching or steadying assistance (Pt.75%)  Function - Air cabin crew transfer assistive device: Grab bar Assist level to toilet: Moderate assist (Pt 50 - 74%/lift or lower) Assist level from toilet: Moderate assist (Pt 50 - 74%/lift or lower)        Function - Comprehension Comprehension: Auditory Comprehension assist level: Understands basic 90% of the time/cues < 10% of the time  Function - Expression Expression: Verbal Expression assist level: Expresses basic 90% of the time/requires cueing < 10% of the time.  Function - Social Interaction Social Interaction assist level: Interacts appropriately 90% of the time - Needs monitoring or encouragement for participation or interaction.  Function - Problem Solving Problem solving assist level:  Solves basic 90% of the time/requires cueing < 10% of the time  Function - Memory Memory assist level: Recognizes or recalls 90% of the time/requires cueing < 10% of the time Patient normally able to recall (first 3 days only): Current season  Medical Problem List and Plan:  1. Functional deficits and left hemiparesis secondary to acute right pontine infarct/subacute right temporal/occipital infarcts  CIR PT, OT, SLP 2. DVT Prophylaxis/Anticoagulation: Pharmaceutical: Coumadin with heparin bridge 3. Pain Management: tylenol prn  4. Mood: LCSW to follow for evaluation and support.  Will ask neuropsychology to-follow-up with the patient as he is expressing signs and symptoms of depression  5.  Neuropsych: This patient is capable of making decisions on his own behalf.  6. Skin/Wound Care: Routine pressure relief measures.  7. Fluids/Electrolytes/Nutrition: Monitor I/O. Check lytes in am.  8. Severe CAD with CM: Treated medically with ASA, statin  9. Acute systolic CHF: Was started spironolactone and digoxin on 12/7. Monitor BP and avoid hypotension/hypoperfusion. Vitals:   07/06/17 1756 07/07/17 0520  BP: 115/80 110/70  Pulse: (!) 107 80  Resp: 16 16  Temp: 98.7 F (37.1 C) 98.1 F (36.7 C)  SpO2: 97% 96%   10.  Apical throbus IV heparin->warfarin 10. Prediabetes: Hgb A1C- 6.0. Will have dietitian educate patient on CM diet--monitor BS for trends.  98. Leukocytosis:Monitor for signs of infection.  12. Polysubstance abuse: Has been counseled on importance of alcohol, cocaine and THC cessation    LOS (Days) 1 A FACE TO FACE EVALUATION WAS PERFORMED  Charlett Blake 07/07/2017, 6:59 AM

## 2017-07-07 NOTE — Evaluation (Signed)
Physical Therapy Assessment and Plan  Patient Details  Name: Angel Davies MRN: [id number] Date of Birth: 06/29/1971  PT Diagnosis: Abnormality of gait, Ataxia, Hemiplegia non-dominant, Hypotonia, Impaired cognition and Muscle weakness Rehab Potential: Good ELOS: 10-14 days    Today's Date: 07/07/2017 PT Individual Time: 0800-0900 AND 1445-1515 PT Individual Time Calculation (min): 60 min  AND 30 min   Problem List:  Patient Active Problem List   Diagnosis Date Noted  . Right pontine stroke ([place]) 07/06/2017  . Left hemiparesis ([place])   . Cardiomyopathy, ischemic   . LV (left ventricular) mural thrombus without MI   . Acute systolic heart failure ([place])   . Myocardiopathy ([place])   . Mixed hyperlipidemia   . CVA (cerebral vascular accident) ([place]) 07/02/2017  . Alcohol use 07/02/2017  . Anxiety 07/02/2017  . Depression 07/02/2017  . Acute hyperglycemia 07/02/2017  . Elevated blood-pressure reading without diagnosis of hypertension 07/02/2017  . ADHD 07/02/2017  . Cocaine abuse ([place]) 07/02/2017  . Marijuana abuse 06/20/2017  . Cocaine use 06/20/2017    Past Medical History:  Past Medical History:  Diagnosis Date  . ADHD (attention deficit hyperactivity disorder)   . Anxiety   . CVA (cerebral vascular accident) ([place]) 07/02/2017   "left sided weakness" (07/02/2017)  . Depression   . History of kidney stones    Past Surgical History:  Past Surgical History:  Procedure Laterality Date  . CYSTOSCOPY WITH URETEROSCOPY, STONE BASKETRY AND STENT PLACEMENT    . RIGHT/LEFT HEART CATH AND CORONARY ANGIOGRAPHY N/A 07/05/2017   Procedure: RIGHT/LEFT HEART CATH AND CORONARY ANGIOGRAPHY;  Surgeon: Jolaine Artist, MD;  Location: [facility];  Service: Cardiovascular;  Laterality: N/A;    Assessment & Plan Clinical Impression: Patient is a 46 y.o. male with history of ADHD, anxiety, depression/anxiety, fall 12/2016 with loss of peripheral vision and onset of HA, polysubtance  abuse who was admitted on 07/02/17 with fall, slurred speech and left sided weakness. Patient reported that he was drinking ETOH and used cocaine the night before--UDS positive for cocaine and THC. CT head negative. MRI/MRA brain done revealing " Acute RIGHT pontine infarct, Subacute small RIGHT temporal occipital lobe/PCA territory infarct and severe stenosis distal rigth P2 segment. CTA head/neck was negative for emergent vascular finding, 40% left SA stenosis due to plaque and emphysema. 2D echo showed EF 20-25% with moderately dilated LV, akinesis of anterior and apical myocardium with grade 2 diastolic dysfunction.  He was evaluated by Dr. Haroldine Laws and underwent cardiac cath on 12/6 revealing severe CAD with occluded mid LAD and well compensated filling pressures with moderately reduced cardiac output. Cardiac MRI ordered today for work up of decrease in EF out of proportion to CAD.  MRI reveals severe left ventricular dysfunction with ejection fraction of 18% and septal/apical akinesis with diffuse hypokinesis.  Also notable is an apical thrombus measuring 11 mm x 8 mm and a full-thickness scar involving the left mid and distal septum and apex.  Additionally a subendocardial scar involving the basal inferior lateral wall was seen.     Patient transferred to CIR on 07/06/2017 .   Patient currently requires mod with mobility secondary to muscle weakness, unbalanced muscle activation, ataxia and decreased coordination, decreased awareness, decreased problem solving, decreased safety awareness and delayed processing and decreased sitting balance, decreased standing balance, decreased postural control, hemiplegia and decreased balance strategies.  Prior to hospitalization, patient was independent  with mobility and lived with Spouse, Family in a House home.  Home  access is 2Stairs to enter.  Patient will benefit from skilled PT intervention to maximize safe functional mobility, minimize fall risk and  decrease caregiver burden for planned discharge home with intermittent assist.  Anticipate patient will benefit from follow up [facility] at discharge.  PT Assessment Rehab Potential (ACUTE/IP ONLY): Good PT Barriers to Discharge: Inaccessible home environment;Decreased caregiver support PT Patient demonstrates impairments in the following area(s): Balance;Behavior;Edema;Endurance;Motor;Nutrition;Pain;Perception;Safety;Sensory;Skin Integrity PT Transfers Functional Problem(s): Bed Mobility;Bed to Chair;Car;Floor;Furniture PT Locomotion Functional Problem(s): Ambulation;Stairs;Wheelchair Mobility PT Plan PT Intensity: Minimum of 1-2 x/day ,45 to 90 minutes PT Frequency: 5 out of 7 days PT Duration Estimated Length of Stay: 10-14 days  PT Treatment/Interventions: Ambulation/gait training;Community reintegration;DME/adaptive equipment instruction;Neuromuscular re-education;Psychosocial support;Stair training;UE/LE Strength taining/ROM;Wheelchair propulsion/positioning;Balance/vestibular training;Discharge planning;Functional electrical stimulation;Pain management;Skin care/wound management;Therapeutic Activities;UE/LE Coordination activities;Cognitive remediation/compensation;Functional mobility training;Disease management/prevention;Patient/family education;Splinting/orthotics;Visual/perceptual remediation/compensation;Therapeutic Exercise PT Transfers Anticipated Outcome(s): Mod I with LRAD  PT Locomotion Anticipated Outcome(s): Ambulatory at supervision level.  PT Recommendation Recommendations for Other Services: Therapeutic Recreation consult;Neuropsych consult Therapeutic Recreation Interventions: Stress management;Outing/community reintergration Follow Up Recommendations: Home health PT Patient destination: Home Equipment Recommended: Rolling walker with 5" wheels;Wheelchair cushion (measurements);Wheelchair (measurements);To be determined  Skilled Therapeutic Intervention PT instructed patient in  PT Evaluation and initiated treatment intervention; see below for results. PT educated patient in [place], rehab potential, rehab goals, and discharge recommendations.   Session 2.   Pt received sitting in WC and agreeable to PT. PT instructed pt in WC mobility with hemi technique 259f with min progessing to supervision assist from PT.  Gait training without AD x 626fwith mod-max assist to prevent LOB. Pt noted to land in inversion at end of gait requiring PT to sit pt on mat table. Additional gait training with DF ace wrap x 60 ft with mod assist. Significant improvement in gait pattern with DF wrap.  Patient returned to room and left sitting in [facility] call bell in reach and all needs met.        PT Evaluation Precautions/Restrictions Precautions Precautions: Fall Restrictions Weight Bearing Restrictions: No General   Vital SignsTherapy Vitals Temp: 98.1 F (36.7 C) Temp Source: Oral Pulse Rate: 80 Resp: 16 BP: 110/70 Patient Position (if appropriate): Lying Oxygen Therapy SpO2: 96 % O2 Device: Not Delivered Pain Pain Assessment Pain Assessment: No/denies pain Pain Score: 0-No pain Home Living/Prior Functioning Home Living Available Help at Discharge: Family;Available 24 hours/day Type of Home: House Home Access: Stairs to enter En[employer] Energyf Steps: 2 Entrance Stairs-Rails: None Home Layout: Two level Alternate Level Stairs-Number of Steps: flight Alternate Level Stairs-Rails: None Bathroom Shower/Tub: TuChiropodistStandard Bathroom Accessibility: Yes  Lives With: Spouse;Family Prior Function Level of Independence: Independent with basic ADLs;Independent with gait  Able to Take Stairs?: Yes Driving: Yes Vocation: Full time employment Comments: Warehouse work; usually desk work. driving. Rn is at hoElktonhe day to assist father.  Vision/Perception    WFL Cognition Overall Cognitive Status: Within Functional Limits for  tasks assessed Orientation Level: Oriented X4 Memory: Appears intact Awareness: Appears intact Problem Solving: Impaired Behaviors: Impulsive;Lability Safety/Judgment: Impaired Comments: Impulsive. Labile.  Sensation Sensation Light Touch: Appears Intact Proprioception: Impaired by gross assessment Additional Comments: mild ataxia in the LLE Coordination Gross Motor Movements are Fluid and Coordinated: No Fine Motor Movements are Fluid and Coordinated: No Coordination and Movement Description: mild Ataxia on the LUE and LLE  Finger Nose Finger Test: limited on the L due to strength deficits.  Heel Shin Test: limited on the L due  HF weakness.  Motor  Motor Motor: Hemiplegia;Ataxia Motor - Skilled Clinical Observations: L sided hemi plegia with mild ataxia  Mobility Bed Mobility Bed Mobility: Rolling Right;Rolling Left;Supine to Sit;Sit to Supine Rolling Right: 4: Min assist Rolling Right Details: Verbal cues for technique;Verbal cues for precautions/safety;Manual facilitation for placement Rolling Left: 5: Supervision(with bed rails ) Supine to Sit: 4: Min assist Supine to Sit Details: Verbal cues for technique;Verbal cues for precautions/safety;Verbal cues for safe use of DME/AE Sit to Supine: 4: Min assist Sit to Supine - Details: Visual cues for safe use of DME/AE;Visual cues/gestures for precautions/safety Transfers Transfers: Yes Sit to Stand: 4: Min guard Stand Pivot Transfers: 4: Min assist Stand Pivot Transfer Details: Verbal cues for precautions/safety;Verbal cues for technique;Verbal cues for gait pattern;Verbal cues for safe use of DME/AE Locomotion  Ambulation Ambulation: Yes Ambulation/Gait Assistance: 3: Mod assist Ambulation Distance (Feet): 40 Feet Assistive device: None;Standard walker Ambulation/Gait Assistance Details: gait with and without AD with mod assist from PT due to decreased coordination of the LLE Gait Gait: Yes Gait Pattern: Impaired Gait  Pattern: Poor foot clearance - left;Narrow base of support;Ataxic;Scissoring;Lateral hip instability Stairs / Additional Locomotion Stairs: Yes Stairs Assistance: 3: Mod assist Stairs Assistance Details: Verbal cues for technique;Verbal cues for gait pattern;Verbal cues for safe use of DME/AE;Verbal cues for precautions/safety;Manual facilitation for placement Stair Management Technique: One rail Right Number of Stairs: 4 Wheelchair Mobility Wheelchair Mobility: Yes Wheelchair Assistance: 4: Min assist Wheelchair Parts Management: Needs assistance Distance: 127f  Trunk/Postural Assessment  Cervical Assessment Cervical Assessment: Within Functional Limits Thoracic Assessment Thoracic Assessment: Within Functional Limits Lumbar Assessment Lumbar Assessment: Exceptions to WPh[facility] Hospital[facility] Harlem-CahPostural Control Postural Control: Deficits on evaluation(mild posterior lean when sitting unsupported able to correct with cues from PT)  Balance Balance Balance Assessed: Yes Static Sitting Balance Static Sitting - Level of Assistance: 6: Modified independent (Device/Increase time) Dynamic Sitting Balance Dynamic Sitting - Level of Assistance: 5: Stand by assistance Static Standing Balance Static Standing - Level of Assistance: 4: Min assist Dynamic Standing Balance Dynamic Standing - Level of Assistance: 3: Mod assist Extremity Assessment      RLE Assessment RLE Assessment: Exceptions to WSam Ray[facility] Memorial Veterans CenterRLE Strength RLE Overall Strength Comments: Grossly 4-/5 except hip flexion 3+/5 and ankle DF 1/5 LLE Assessment LLE Assessment: Within Functional Limits(5/5 proximal to distal)   See Function Navigator for Current Functional Status.   Refer to Care Plan for Long Term Goals  Recommendations for other services: Neuropsych and Therapeutic Recreation  Stress management and Outing/community reintegration  Discharge Criteria: Patient will be discharged from PT if patient refuses treatment 3 consecutive times  without medical reason, if treatment goals not met, if there is a change in medical status, if patient makes no progress towards goals or if patient is discharged from hospital.  The above assessment, treatment plan, treatment alternatives and goals were discussed and mutually agreed upon: by patient and by family  ALorie Phenix12/02/2017, 9:06 AM

## 2017-07-07 NOTE — Progress Notes (Addendum)
Advanced Heart Failure Rounding Note  Primary Cardiologist: New (Dr. Gala Romney)  Subjective:     Feels ok. Trying to come to terms with diagnosis. No CP or SOB. Left side still weak. On heparin gtt for LV clot on MRI.   cMRI on 12/7 with 1) Severe LVE with EF 18% septal and apical akinesis diffuse hypokinesis 2) Apical thrombus measuring 11 mm x 8 mm 3) Full thickness scar involving the mid and distal septum and apex Subendocardial scar involving the basal inferior lateral wall    R/LHC 07/05/17  Mid RCA to Dist RCA lesion is 100% stenosed.  Dist Cx lesion is 100% stenosed.  Mid LAD lesion is 100% stenosed.  Prox Cx to Dist Cx lesion is 30% stenosed.   Findings:  Ao = 104/68 (84) LV = 110/9 RA = 1 RV = 19/3 PA = 19/9 (11) PCW = 5 Fick cardiac output/index = 4.3/2.1 PVR = 1.5 WU SVR = 2122 Ao sat = 96% PA sat = 64%, 66%  Assessment: 1. Severe CAD with left-dominant system and occluded mLAD 2. Ischemic CM with EF 25% by echo 3. Well compensated filling pressures with moderately reduced cardiac output  Objective:   Weight Range: 86.3 kg (190 lb 4.8 oz) Body mass index is 28.1 kg/m.   Vital Signs:   Temp:  [98.1 F (36.7 C)-98.7 F (37.1 C)] 98.1 F (36.7 C) (12/08 0520) Pulse Rate:  [80-107] 80 (12/08 0520) Resp:  [16-24] 16 (12/08 0520) BP: (110-122)/(70-88) 110/70 (12/08 0520) SpO2:  [96 %-97 %] 96 % (12/08 0520) Weight:  [86.3 kg (190 lb 4.8 oz)] 86.3 kg (190 lb 4.8 oz) (12/07 1716) Last BM Date: 07/02/17  Weight change: Filed Weights   07/06/17 1716  Weight: 86.3 kg (190 lb 4.8 oz)    Intake/Output:   Intake/Output Summary (Last 24 hours) at 07/07/2017 1123 Last data filed at 07/06/2017 1841 Gross per 24 hour  Intake 240 ml  Output -  Net 240 ml      Physical Exam    General:  Sitting in chair. No resp difficulty HEENT: normal Neck: supple. no JVD. Carotids 2+ bilat; no bruits. No lymphadenopathy or thryomegaly  appreciated. Cor: PMI laterally displaced. Regular rate & rhythm. No rubs, gallops or murmurs. Lungs: clear Abdomen: soft, nontender, nondistended. No hepatosplenomegaly. No bruits or masses. Good bowel sounds. Extremities: no cyanosis, clubbing, rash, edema Neuro: alert & orientedx3, cranial nerves grossly intact. Left side weak Affect pleasant   Telemetry   N/A  Labs    CBC Recent Labs    07/06/17 0318 07/07/17 0223  WBC 12.6* 12.2*  NEUTROABS 5.0 4.9  HGB 16.4 16.2  HCT 49.0 47.7  MCV 92.1 91.4  PLT 300 292   Basic Metabolic Panel Recent Labs    16/10/96 0318 07/07/17 0223  NA 138 136  K 4.1 3.8  CL 106 104  CO2 22 23  GLUCOSE 103* 134*  BUN 13 12  CREATININE 0.94 0.93  CALCIUM 9.6 9.0   Liver Function Tests Recent Labs    07/07/17 0223  AST 26  ALT 34  ALKPHOS 78  BILITOT 0.4  PROT 6.7  ALBUMIN 3.8   No results for input(s): LIPASE, AMYLASE in the last 72 hours. Cardiac Enzymes No results for input(s): CKTOTAL, CKMB, CKMBINDEX, TROPONINI in the last 72 hours.  BNP: BNP (last 3 results) No results for input(s): BNP in the last 8760 hours.  ProBNP (last 3 results) No results for input(s): PROBNP in  the last 8760 hours.   D-Dimer No results for input(s): DDIMER in the last 72 hours. Hemoglobin A1C No results for input(s): HGBA1C in the last 72 hours. Fasting Lipid Panel No results for input(s): CHOL, HDL, LDLCALC, TRIG, CHOLHDL, LDLDIRECT in the last 72 hours. Thyroid Function Tests No results for input(s): TSH, T4TOTAL, T3FREE, THYROIDAB in the last 72 hours.  Invalid input(s): FREET3  Other results:   Imaging    Mr Cardiac Morphology W Wo Contrast  Result Date: 07/06/2017 CLINICAL DATA:  Ischemic Cardiomyopathy EXAM: CARDIAC MRI TECHNIQUE: The patient was scanned on a 1.5 Tesla GE magnet. A dedicated cardiac coil was used. Functional imaging was done using Fiesta sequences. 2,3, and 4 chamber views were done to assess for RWMA's.  Modified Simpson's rule using a short axis stack was used to calculate an ejection fraction on a dedicated work Research officer, trade unionstation using Circle software. The patient received 30 cc of Multihance. After 10 minutes inversion recovery sequences were used to assess for infiltration and scar tissue. CONTRAST:  30 cc Multihance FINDINGS: Atrial were normal in size. RV normal in size and function No pericardial effusion Normal AV and aortic root. Trivial MR. The LV was severely dilated. There was diffuse hypokinesis with akinesis of the septum and apex. There was a small apical thrombus measuring 11 mm x 8 mm. The quantitative EF was 18% (EDV 158 cc ESV 128 cc SV 28 cc) Delayed enhancement images showed full thickness scar involving the mid and distal septum and apex. There was subendocardial scar involving the basal inferior lateral wall. IMPRESSION: 1) Severe LVE with EF 18% septal and apical akinesis diffuse hypokinesis 2) Apical thrombus measuring 11 mm x 8 mm 3) Full thickness scar involving the mid and distal septum and apex Subendocardial scar involving the basal inferior lateral wall Charlton HawsPeter Nishan Electronically Signed   By: Charlton HawsPeter  Nishan M.D.   On: 07/06/2017 15:57     Medications:     Scheduled Medications: . atorvastatin  80 mg Oral q1800  . digoxin  0.125 mg Oral Daily  . fenofibrate  160 mg Oral Daily  . folic acid  1 mg Oral Daily  . multivitamin with minerals  1 tablet Oral Daily  . nicotine  14 mg Transdermal Daily  . spironolactone  12.5 mg Oral QHS  . thiamine  100 mg Oral Daily  . Warfarin - Pharmacist Dosing Inpatient   Does not apply q1800    Infusions: . heparin 1,400 Units/hr (07/07/17 0346)    PRN Medications: acetaminophen, alum & mag hydroxide-simeth, bisacodyl, diphenhydrAMINE, guaiFENesin-dextromethorphan, polyethylene glycol, prochlorperazine **OR** prochlorperazine **OR** prochlorperazine, sodium phosphate, traZODone    Patient Profile   Claretha CooperJames D Geisel is a 46 y.o. male with  PMH of HTN, ADHD, obesity, anxiety, and depression.   Presented 07/02/17 with CVA. Found to have systolic CHF by echo.  UDS notably + for cocaine.  Assessment/Plan   1. 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 - With EF < 30% and LV thrombus will need AC. - Currently on heparin/ coumadin  - Appreciate CIR  2. Acute systolic CHF due to ICM.  - Echo 07/02/17 with LVEF 20-25%, grade 2 DD, Trivial MR, Trivial TR - cMRI EF 18% - BPs ranging 110-115. HR fast - Continue spiro 12.5 qh - Start losartan 25 daily can eventually transition to [facility] NovamedEntresto as BP tolerates.  - Continue digoxin 0.125 mg daily.  - Start  carvedilol 3.125 bid - Will need repeat echo 3 months after med optimization for ICD consideration.   3. CAD - LHC 07/05/17 with severe CAD with left-dominant system and occluded mLAD. - Mostly distal CAD. No good targets for CABG or PCI. Continue medical therapy. Based on scar pattern on MRI, I am hopeful EF will improve moderately   - Continue ASA and statin   4. Cocaine abuse - Stressed importance of complete cessation. No change.   5. Alcohol abuse - Stressed importance of complete cessation. No change.   6. HLD - Started on atorvastatin 80 mg daily.  - Continue  fenofibrate. No change.   OK for CIR. HF team will see again on Monday, or sooner as needed.   Medication concerns reviewed with patient and pharmacy team. Barriers identified: None at this time.   Length of Stay: 1  Arvilla Meresaniel Adekunle Rohrbach, MD  07/07/2017, 11:23 AM  Advanced Heart Failure Team Pager [phone] (M-F; 7a - 4p)  Please contact [facility] for night-coverage after hours (4p -7a ) and weekends on [email or web address]

## 2017-07-07 NOTE — Plan of Care (Signed)
  Not Progressing RH BOWEL ELIMINATION RH STG MANAGE BOWEL WITH ASSISTANCE Description STG Manage Bowel with min Assistance.  07/07/2017 1303 - Not Progressing by Shatana Saxton, Danella MaiersAshley M, RN RH STG MANAGE BOWEL W/MEDICATION W/ASSISTANCE Description STG Manage Bowel with Medication with Min Assistance.  07/07/2017 1303 - Not Progressing by Kylan Veach, Danella MaiersAshley M, RN RH STG MANAGE BOWEL W/EQUIPMENT W/ASSISTANCE Description STG Manage Bowel With Equipment With Min Assistance  07/07/2017 1303 - Not Progressing by Kenney Going, Danella MaiersAshley M, RN  LBM 12/3- Will administer PRN laxative today

## 2017-07-07 NOTE — Progress Notes (Signed)
ANTICOAGULATION CONSULT NOTE - Follow Up Consult  Pharmacy Consult for Heparin  Indication: stroke and LV thrombus  No Known Allergies  Patient Measurements: Height: 5\' 9"  (175.3 cm) Weight: 190 lb 4.8 oz (86.3 kg) IBW/kg (Calculated) : 70.7  Vital Signs: Temp: 98.1 F (36.7 C) (12/08 0520) Temp Source: Oral (12/08 0520) BP: 110/70 (12/08 0520) Pulse Rate: 80 (12/08 0520)  Labs: Recent Labs    07/05/17 0257 07/06/17 0318 07/07/17 0223 07/07/17 0225 07/07/17 1158  HGB 16.5 16.4 16.2  --   --   HCT 49.8 49.0 47.7  --   --   PLT 311 300 292  --   --   LABPROT  --   --  13.1  --   --   INR  --   --  1.00  --   --   HEPARINUNFRC  --   --   --  0.23* 0.33  CREATININE 0.91 0.94 0.93  --   --     Estimated Creatinine Clearance: 105.7 mL/min (by C-G formula based on SCr of 0.93 mg/dL).  Assessment: 46 y/o M s/p acute embolic stroke, also found to have LV thrombus, on heparin/warfarin bridge. Heparin level this afternoon is 0.33 and within goal range. Noted that the heparin level goal is tighter at 0.3-0.5 than typical 0.3-0.7 goal. CBC is stable. No signs or symptoms of bleeding noted at this time. The INR was reported as 1.0 this morning and is below goal of 2-3.   Goal of Therapy:  Heparin level 0.3-0.5 units/mL INR goal 2-3 Monitor platelets by anticoagulation protocol: Yes   Plan:  Continue heparin at 1400 units/hr Warfarin 10 mg PO x 1 dose this evening  Daily heparin level and CBC  Daily INR  F/U with cardiology regarding HL goal moving forward  Monitor for S/Sx of bleeding   Blake DivineShannon Liviana Mills, Pharm.D. PGY1 Pharmacy Resident 07/07/2017 12:54 PM Main Pharmacy: [phone]

## 2017-07-08 LAB — CBC
HEMATOCRIT: 45.9 % (ref 39.0–52.0)
HEMOGLOBIN: 15.5 g/dL (ref 13.0–17.0)
MCH: 31.1 pg (ref 26.0–34.0)
MCHC: 33.8 g/dL (ref 30.0–36.0)
MCV: 92 fL (ref 78.0–100.0)
Platelets: 279 10*3/uL (ref 150–400)
RBC: 4.99 MIL/uL (ref 4.22–5.81)
RDW: 13.7 % (ref 11.5–15.5)
WBC: 11.4 10*3/uL — AB (ref 4.0–10.5)

## 2017-07-08 LAB — PROTIME-INR
INR: 1.97
Prothrombin Time: 22.3 seconds — ABNORMAL HIGH (ref 11.4–15.2)

## 2017-07-08 LAB — HEPARIN LEVEL (UNFRACTIONATED): Heparin Unfractionated: 0.29 IU/mL — ABNORMAL LOW (ref 0.30–0.70)

## 2017-07-08 MED ORDER — WARFARIN SODIUM 7.5 MG PO TABS: 7.5000 mg | ORAL_TABLET | Freq: Once | ORAL | Status: DC

## 2017-07-08 NOTE — Progress Notes (Signed)
Subjective/Complaints: Pt slept well feels he can lift LUE higher  ROS-  No CP, SOB, N/V/D  Objective: Vital Signs: Blood pressure 102/65, pulse 77, temperature 97.6 F (36.4 C), temperature source Oral, resp. rate 18, height 5' 9" (1.753 m), weight 86.3 kg (190 lb 4.8 oz), SpO2 94 %. Mr Cardiac Morphology W Wo Contrast  Result Date: 07/06/2017 CLINICAL DATA:  Ischemic Cardiomyopathy EXAM: CARDIAC MRI TECHNIQUE: The patient was scanned on a 1.5 Tesla GE magnet. A dedicated cardiac coil was used. Functional imaging was done using Fiesta sequences. 2,3, and 4 chamber views were done to assess for RWMA's. Modified Simpson's rule using a short axis stack was used to calculate an ejection fraction on a dedicated work Conservation officer, nature. The patient received 30 cc of Multihance. After 10 minutes inversion recovery sequences were used to assess for infiltration and scar tissue. CONTRAST:  30 cc Multihance FINDINGS: Atrial were normal in size. RV normal in size and function No pericardial effusion Normal AV and aortic root. Trivial MR. The LV was severely dilated. There was diffuse hypokinesis with akinesis of the septum and apex. There was a small apical thrombus measuring 11 mm x 8 mm. The quantitative EF was 18% (EDV 158 cc ESV 128 cc SV 28 cc) Delayed enhancement images showed full thickness scar involving the mid and distal septum and apex. There was subendocardial scar involving the basal inferior lateral wall. IMPRESSION: 1) Severe LVE with EF 18% septal and apical akinesis diffuse hypokinesis 2) Apical thrombus measuring 11 mm x 8 mm 3) Full thickness scar involving the mid and distal septum and apex Subendocardial scar involving the basal inferior lateral wall Jenkins Rouge Electronically Signed   By: Jenkins Rouge M.D.   On: 07/06/2017 15:57   Results for orders placed or performed during the hospital encounter of 07/06/17 (from the past 72 hour(s))  Protime-INR     Status: None    Collection Time: 07/07/17  2:23 AM  Result Value Ref Range   Prothrombin Time 13.1 11.4 - 15.2 seconds   INR 1.00   CBC WITH DIFFERENTIAL     Status: Abnormal   Collection Time: 07/07/17  2:23 AM  Result Value Ref Range   WBC 12.2 (H) 4.0 - 10.5 K/uL   RBC 5.22 4.22 - 5.81 MIL/uL   Hemoglobin 16.2 13.0 - 17.0 g/dL   HCT 47.7 39.0 - 52.0 %   MCV 91.4 78.0 - 100.0 fL   MCH 31.0 26.0 - 34.0 pg   MCHC 34.0 30.0 - 36.0 g/dL   RDW 13.5 11.5 - 15.5 %   Platelets 292 150 - 400 K/uL   Neutrophils Relative % 40 %   Neutro Abs 4.9 1.7 - 7.7 K/uL   Lymphocytes Relative 48 %   Lymphs Abs 5.9 (H) 0.7 - 4.0 K/uL   Monocytes Relative 10 %   Monocytes Absolute 1.2 (H) 0.1 - 1.0 K/uL   Eosinophils Relative 2 %   Eosinophils Absolute 0.2 0.0 - 0.7 K/uL   Basophils Relative 0 %   Basophils Absolute 0.0 0.0 - 0.1 K/uL  Comprehensive metabolic panel     Status: Abnormal   Collection Time: 07/07/17  2:23 AM  Result Value Ref Range   Sodium 136 135 - 145 mmol/L   Potassium 3.8 3.5 - 5.1 mmol/L   Chloride 104 101 - 111 mmol/L   CO2 23 22 - 32 mmol/L   Glucose, Bld 134 (H) 65 - 99 mg/dL   BUN 12  6 - 20 mg/dL   Creatinine, Ser 0.93 0.61 - 1.24 mg/dL   Calcium 9.0 8.9 - 10.3 mg/dL   Total Protein 6.7 6.5 - 8.1 g/dL   Albumin 3.8 3.5 - 5.0 g/dL   AST 26 15 - 41 U/L   ALT 34 17 - 63 U/L   Alkaline Phosphatase 78 38 - 126 U/L   Total Bilirubin 0.4 0.3 - 1.2 mg/dL   GFR calc non Af Amer >60 >60 mL/min   GFR calc Af Amer >60 >60 mL/min    Comment: (NOTE) The eGFR has been calculated using the CKD EPI equation. This calculation has not been validated in all clinical situations. eGFR's persistently <60 mL/min signify possible Chronic Kidney Disease.    Anion gap 9 5 - 15  Heparin level (unfractionated)     Status: Abnormal   Collection Time: 07/07/17  2:25 AM  Result Value Ref Range   Heparin Unfractionated 0.23 (L) 0.30 - 0.70 IU/mL    Comment:        IF HEPARIN RESULTS ARE BELOW EXPECTED  VALUES, AND PATIENT DOSAGE HAS BEEN CONFIRMED, SUGGEST FOLLOW UP TESTING OF ANTITHROMBIN III LEVELS.   Heparin level (unfractionated)     Status: None   Collection Time: 07/07/17 11:58 AM  Result Value Ref Range   Heparin Unfractionated 0.33 0.30 - 0.70 IU/mL    Comment:        IF HEPARIN RESULTS ARE BELOW EXPECTED VALUES, AND PATIENT DOSAGE HAS BEEN CONFIRMED, SUGGEST FOLLOW UP TESTING OF ANTITHROMBIN III LEVELS.   Protime-INR     Status: Abnormal   Collection Time: 07/08/17  5:32 AM  Result Value Ref Range   Prothrombin Time 22.3 (H) 11.4 - 15.2 seconds   INR 1.97   CBC     Status: Abnormal   Collection Time: 07/08/17  5:32 AM  Result Value Ref Range   WBC 11.4 (H) 4.0 - 10.5 K/uL   RBC 4.99 4.22 - 5.81 MIL/uL   Hemoglobin 15.5 13.0 - 17.0 g/dL   HCT 45.9 39.0 - 52.0 %   MCV 92.0 78.0 - 100.0 fL   MCH 31.1 26.0 - 34.0 pg   MCHC 33.8 30.0 - 36.0 g/dL   RDW 13.7 11.5 - 15.5 %   Platelets 279 150 - 400 K/uL  Heparin level (unfractionated)     Status: Abnormal   Collection Time: 07/08/17  5:32 AM  Result Value Ref Range   Heparin Unfractionated 0.29 (L) 0.30 - 0.70 IU/mL    Comment:        IF HEPARIN RESULTS ARE BELOW EXPECTED VALUES, AND PATIENT DOSAGE HAS BEEN CONFIRMED, SUGGEST FOLLOW UP TESTING OF ANTITHROMBIN III LEVELS.      HEENT: normal Cardio: RRR and no murmur Resp: CTA B/L and unlabored GI: BS positive and NT, ND Extremity:  Pulses positive and No Edema Skin:   Intact and Other IV site ok Neuro: Alert/Oriented, Normal Sensory, Abnormal Motor 3 left delt bi tri FF, 3+ L HF, KE 0 ADF and Abnormal FMC Ataxic/ dec FMC Musc/Skel:  Normal Gen NAD   Assessment/Plan: 1. 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 Physiatrist is providing close team supervision and 24 hour management of active medical problems listed  below. Physiatrist and rehab team continue to assess barriers to discharge/monitor patient progress toward functional and medical goals. FIM: Function - Bathing Bathing activity did not occur: Refused  Function- Upper Body Dressing/Undressing What is the patient wearing?: Pull over shirt/dress Pull over shirt/dress - Perfomed by patient: Thread/unthread right sleeve, Put head through opening Pull over shirt/dress - Perfomed by helper: Thread/unthread left sleeve Assist Level: Touching or steadying assistance(Pt > 75%) Function - Lower Body Dressing/Undressing What is the patient wearing?: Non-skid slipper socks, Pants Pants- Performed by patient: Thread/unthread right pants leg, Pull pants up/down Pants- Performed by helper: Thread/unthread left pants leg Non-skid slipper socks- Performed by helper: Don/doff right sock, Don/doff left sock Assist for lower body dressing: Touching or steadying assistance (Pt > 75%)  Function - Toileting Toileting steps completed by patient: Performs perineal hygiene Toileting steps completed by helper: Adjust clothing prior to toileting, Performs perineal hygiene, Adjust clothing after toileting Toileting Assistive Devices: Grab bar or rail Assist level: Touching or steadying assistance (Pt.75%)  Function - Air cabin crew transfer assistive device: Grab bar Assist level to toilet: Touching or steadying assistance (Pt > 75%) Assist level from toilet: Touching or steadying assistance (Pt > 75%)  Function - Chair/bed transfer Chair/bed transfer method: Stand pivot Chair/bed transfer assist level: Touching or steadying assistance (Pt > 75%) Chair/bed transfer assistive device: Armrests  Function - Locomotion: Wheelchair Will patient use wheelchair at discharge?: Yes Type: Manual Max wheelchair distance: 132f Assist Level: Touching or steadying assistance (Pt > 75%) Assist Level: Touching or steadying assistance (Pt > 75%) Wheel 150 feet  activity did not occur: Safety/medical concerns Function - Locomotion: Ambulation Assistive device: No device Max distance: 460f Assist level: Moderate assist (Pt 50 - 74%) Assist level: Moderate assist (Pt 50 - 74%) Walk 50 feet with 2 turns activity did not occur: Safety/medical concerns Walk 150 feet activity did not occur: Safety/medical concerns Walk 10 feet on uneven surfaces activity did not occur: Safety/medical concerns  Function - Comprehension Comprehension: Auditory Comprehension assist level: Follows basic conversation/direction with no assist  Function - Expression Expression: Verbal Expression assist level: Expresses basic needs/ideas: With no assist  Function - Social Interaction Social Interaction assist level: Interacts appropriately 90% of the time - Needs monitoring or encouragement for participation or interaction.  Function - Problem Solving Problem solving assist level: Solves basic problems with no assist  Function - Memory Memory assist level: Recognizes or recalls 90% of the time/requires cueing < 10% of the time Patient normally able to recall (first 3 days only): Current season, Location of own room, Staff names and faces, That he or she is in a hospital  Medical Problem List and Plan:  1. Functional deficits and left hemiparesis secondary to acute right pontine infarct/subacute right temporal/occipital infarcts  CIR PT, OT, SLP - some improvement with LUE MMT 2. DVT Prophylaxis/Anticoagulation: Pharmaceutical: Coumadin with heparin bridge 3. Pain Management: tylenol prn  4. Mood: LCSW to follow for evaluation and support.  Will ask neuropsychology to-follow-up with the patient as he is expressing signs and symptoms of depression  5. Neuropsych: This patient is capable of making decisions on his own behalf.  6. Skin/Wound Care: Routine pressure relief measures.  7. Fluids/Electrolytes/Nutrition: Monitor I/O. Lytes normal 12/8  8. Severe CAD with CM:  Treated medically with ASA, statin  9. Acute systolic CHF: Was started spironolactone and digoxin on 12/7. Monitor BP and avoid hypotension/hypoperfusion. Vitals:   07/07/17 1804 07/08/17 0540  BP: 130/88 102/65  Pulse: 98 77  Resp: 18 18  Temp: 98.4 F (36.9 C) 97.6 F (36.4 C)  SpO2: 96% 94%   10.  Apical throbus IV heparin->warfarin, INR  1.97 today-Pharm protocol 10. Prediabetes: Hgb A1C- 6.0. Will have dietitian educate patient on CM diet--monitor BS for trends.  60. Leukocytosis:Monitor for signs of infection. WBCs down to 11.4 12. Polysubstance abuse: Has been counseled on importance of alcohol, cocaine and THC cessation    LOS (Days) 2 A FACE TO FACE EVALUATION WAS PERFORMED  Charlett Blake 07/08/2017, 9:24 AM

## 2017-07-08 NOTE — Progress Notes (Signed)
ANTICOAGULATION CONSULT NOTE - Follow Up Consult  Pharmacy Consult for Heparin  Indication: stroke and LV thrombus  No Known Allergies  Patient Measurements: Height: 5\' 9"  (175.3 cm) Weight: 190 lb 4.8 oz (86.3 kg) IBW/kg (Calculated) : 70.7  Vital Signs: Temp: 97.6 F (36.4 C) (12/09 0540) Temp Source: Oral (12/09 0540) BP: 102/65 (12/09 0540) Pulse Rate: 77 (12/09 0540)  Labs: Recent Labs    07/06/17 0318 07/07/17 0223 07/07/17 0225 07/07/17 1158 07/08/17 0532  HGB 16.4 16.2  --   --  15.5  HCT 49.0 47.7  --   --  45.9  PLT 300 292  --   --  279  LABPROT  --  13.1  --   --  22.3*  INR  --  1.00  --   --  1.97  HEPARINUNFRC  --   --  0.23* 0.33 0.29*  CREATININE 0.94 0.93  --   --   --     Estimated Creatinine Clearance: 105.7 mL/min (by C-G formula based on SCr of 0.93 mg/dL).  Assessment: 46 y/o M s/p acute embolic stroke, also found to have LV thrombus, on heparin/warfarin bridge. Heparin level this morning is 0.29, just slightly below goal range. INR came back as 1.97, also slightly below target goal. Hemoglobin and platelets dropped but still WNL. No signs or symptoms of bleeding noted at this time. Per nurse, no concerns with heparin drip.   Goal of Therapy:  Heparin level 0.3-0.5 units/mL INR goal 2-3 Monitor platelets by anticoagulation protocol: Yes   Plan:  Increase heparin to 1500 units/hr Warfarin 7.5 mg PO x 1 dose this evening  Daily heparin level and CBC  Daily INR  F/U with cardiology regarding HL goal moving forward Monitor for S/Sx of bleeding   Blake DivineShannon Shanoah Asbill, Pharm.D. PGY1 Pharmacy Resident 07/08/2017 7:37 AM Main Pharmacy: [phone]

## 2017-07-08 NOTE — Progress Notes (Signed)
Advanced Heart Failure Rounding Note  Primary Cardiologist: New (Dr. Gala RomneyBensimhon)  Subjective:     Feels fine. Struggling with some word finding issues. Still weak on left side.  No bleeding with heparin. No CP or SOB.   cMRI on 12/7 with 1) Severe LVE with EF 18% septal and apical akinesis diffuse hypokinesis 2) Apical thrombus measuring 11 mm x 8 mm 3) Full thickness scar involving the mid and distal septum and apex Subendocardial scar involving the basal inferior lateral wall    R/LHC 07/05/17  Mid RCA to Dist RCA lesion is 100% stenosed.  Dist Cx lesion is 100% stenosed.  Mid LAD lesion is 100% stenosed.  Prox Cx to Dist Cx lesion is 30% stenosed.   Findings:  Ao = 104/68 (84) LV = 110/9 RA = 1 RV = 19/3 PA = 19/9 (11) PCW = 5 Fick cardiac output/index = 4.3/2.1 PVR = 1.5 WU SVR = 2122 Ao sat = 96% PA sat = 64%, 66%  Assessment: 1. Severe CAD with left-dominant system and occluded mLAD 2. Ischemic CM with EF 25% by echo 3. Well compensated filling pressures with moderately reduced cardiac output  Objective:   Weight Range: 86.3 kg (190 lb 4.8 oz) Body mass index is 28.1 kg/m.   Vital Signs:   Temp:  [97.6 F (36.4 C)-98.4 F (36.9 C)] 97.6 F (36.4 C) (12/09 0540) Pulse Rate:  [77-98] 77 (12/09 0540) Resp:  [18] 18 (12/09 0540) BP: (102-130)/(65-88) 102/65 (12/09 0540) SpO2:  [94 %-96 %] 94 % (12/09 0540) Last BM Date: 07/07/17  Weight change: Filed Weights   07/06/17 1716  Weight: 86.3 kg (190 lb 4.8 oz)    Intake/Output:   Intake/Output Summary (Last 24 hours) at 07/08/2017 1211 Last data filed at 07/08/2017 0548 Gross per 24 hour  Intake 240 ml  Output 350 ml  Net -110 ml      Physical Exam    General:  Sitting up in bed  No resp difficulty HEENT: normal Neck: supple. no JVD. Carotids 2+ bilat; no bruits. No lymphadenopathy or thryomegaly appreciated. Cor: PMI nondisplaced. Regular rate & rhythm. No rubs, gallops or  murmurs. Lungs: clear Abdomen: soft, nontender, nondistended. No hepatosplenomegaly. No bruits or masses. Good bowel sounds. Extremities: no cyanosis, clubbing, rash, edema Neuro: alert & orientedx3, cranial nerves grossly intact. Weak on left side. Mild word finding difficulties    Telemetry   N/A  Labs    CBC Recent Labs    07/06/17 0318 07/07/17 0223 07/08/17 0532  WBC 12.6* 12.2* 11.4*  NEUTROABS 5.0 4.9  --   HGB 16.4 16.2 15.5  HCT 49.0 47.7 45.9  MCV 92.1 91.4 92.0  PLT 300 292 279   Basic Metabolic Panel Recent Labs    11/91/4711/02/15 0318 07/07/17 0223  NA 138 136  K 4.1 3.8  CL 106 104  CO2 22 23  GLUCOSE 103* 134*  BUN 13 12  CREATININE 0.94 0.93  CALCIUM 9.6 9.0   Liver Function Tests Recent Labs    07/07/17 0223  AST 26  ALT 34  ALKPHOS 78  BILITOT 0.4  PROT 6.7  ALBUMIN 3.8   No results for input(s): LIPASE, AMYLASE in the last 72 hours. Cardiac Enzymes No results for input(s): CKTOTAL, CKMB, CKMBINDEX, TROPONINI in the last 72 hours.  BNP: BNP (last 3 results) No results for input(s): BNP in the last 8760 hours.  ProBNP (last 3 results) No results for input(s): PROBNP in the last 8760  hours.   D-Dimer No results for input(s): DDIMER in the last 72 hours. Hemoglobin A1C No results for input(s): HGBA1C in the last 72 hours. Fasting Lipid Panel No results for input(s): CHOL, HDL, LDLCALC, TRIG, CHOLHDL, LDLDIRECT in the last 72 hours. Thyroid Function Tests No results for input(s): TSH, T4TOTAL, T3FREE, THYROIDAB in the last 72 hours.  Invalid input(s): FREET3  Other results:   Imaging    No results found.   Medications:     Scheduled Medications: . atorvastatin  80 mg Oral q1800  . carvedilol  3.125 mg Oral BID WC  . digoxin  0.125 mg Oral Daily  . fenofibrate  160 mg Oral Daily  . folic acid  1 mg Oral Daily  . losartan  25 mg Oral Daily  . multivitamin with minerals  1 tablet Oral Daily  . nicotine  14 mg  Transdermal Daily  . spironolactone  12.5 mg Oral QHS  . thiamine  100 mg Oral Daily  . warfarin  7.5 mg Oral ONCE-1800  . Warfarin - Pharmacist Dosing Inpatient   Does not apply q1800    Infusions: . heparin 1,500 Units/hr (07/08/17 0752)    PRN Medications: acetaminophen, alum & mag hydroxide-simeth, bisacodyl, diphenhydrAMINE, guaiFENesin-dextromethorphan, polyethylene glycol, prochlorperazine **OR** prochlorperazine **OR** prochlorperazine, sodium phosphate, sorbitol, traZODone    Patient Profile   Angel Davies is a 46 y.o. male with PMH of HTN, ADHD, obesity, anxiety, and depression.   Presented 07/02/17 with CVA. Found to have systolic CHF by echo.  UDS notably + for cocaine.  Assessment/Plan   1. 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 - With EF < 30% and LV thrombus will need AC. - Currently on heparin/coumadin. INR 1.0 -> 1.97. [facility] 2 doses of warfarin to date. Very rapid increase in INR. I have discussed with our HF Pharamacist. Given risk of ICH post-CVA, we will stop heparin and hold coumadin today. Recheck INR in am  - Appreciate CIR assistance  2. Acute systolic CHF due to ICM.  - Echo 07/02/17 with LVEF 20-25%, grade 2 DD, Trivial MR, Trivial TR - cMRI EF 18% - BPs soft, Volume status ok - Continue spiro, losartan, dig and carvedilol. BP too low to titrate - Can eventually transition to [facility] as BP tolerates.  - Will need repeat echo 3 months after med optimization for ICD consideration.   3. CAD - LHC 07/05/17 with severe CAD with left-dominant system and occluded mLAD. - Mostly distal CAD. No good targets for CABG or PCI. Continue medical therapy. Based on scar pattern on MRI, I am hopeful EF will improve moderately   - Continue ASA and statin   4. Cocaine abuse - Stressed importance of complete cessation. No  change.   5. Alcohol abuse - Stressed importance of complete cessation. No change.   6. HLD - Started on atorvastatin 80 mg daily.  - Continue  fenofibrate. No change.   Length of Stay: 2  [name], MD  07/08/2017, 12:11 PM  Advanced Heart Failure Team Pager [phone] (M-F; 7a - 4p)  Please contact [facility] for night-coverage after hours (4p -7a ) and weekends on [email or web address]

## 2017-07-08 NOTE — Discharge Instructions (Addendum)
Inpatient Rehab Discharge Instructions  Angel CooperJames D Donnellan Discharge date and time:  07/27/17  Activities/Precautions/ Functional Status: Activity: no lifting, driving, or strenuous exercise till cleared by MD Diet: cardiac diet and diabetic diet Wound Care: none needed   Functional status:  ___ No restrictions     ___ Walk up steps independently ___ 24/7 supervision/assistance   ___ Walk up steps with assistance _X__ Intermittent supervision/assistance  ___ Bathe/dress independently _X__ Walk with cane     ___ Bathe/dress with assistance ___ Walk Independently    ___ Shower independently ___ Walk with assistance    ___ Shower with assistance _X__ No alcohol     ___ Return to work/school ________   COMMUNITY REFERRALS UPON DISCHARGE:   Outpatient: PT     OT  [facility] Phone:  [phone]  Appointment Date/Time:  August 06, 2017 at 2:45PM (OT) and 3:30PM (PT) Medical Equipment/Items Ordered:  Small base quad cane  Agency/Supplier:  [facility]         Phone:  [phone]  GENERAL COMMUNITY RESOURCES FOR PATIENT/FAMILY: Support Groups:  [facility]                              2nd Thursday of each month, 3-4PM (except June, July, and August)                              In the dayroom of [facility] on [address]                              For more information, call [phone]  Special Instructions:  Establish care with Dr. Tenny Crawoss (or other primary care provider of your choice) when you get home.   STROKE/TIA DISCHARGE INSTRUCTIONS SMOKING Cigarette smoking nearly doubles your risk of having a stroke & is the single most alterable risk factor  If you smoke or have smoked in the last 12 months, you are advised to quit smoking for your health.  Most of the excess cardiovascular risk related to smoking disappears within a year of stopping.  Ask you doctor about anti-smoking  medications  [place] Quit Line: 1-800-QUIT NOW  Free Smoking Cessation Classes [phone]  CHOLESTEROL Know your levels; limit fat & cholesterol in your diet  Lipid Panel     Component Value Date/Time   CHOL 303 (H) 07/03/2017 0233   TRIG 539 (H) 07/03/2017 0233   HDL 31 (L) 07/03/2017 0233   CHOLHDL 9.8 07/03/2017 0233   VLDL UNABLE TO CALCULATE IF TRIGLYCERIDE OVER 400 mg/dL 52/84/132412/10/2016 40100233   LDLCALC UNABLE TO CALCULATE IF TRIGLYCERIDE OVER 400 mg/dL 27/25/366412/10/2016 40340233      Many patients benefit from treatment even if their cholesterol is at goal.  Goal: Total Cholesterol (CHOL) less than 160  Goal:  Triglycerides (TRIG) less than 150  Goal:  HDL greater than 40  Goal:  LDL (LDLCALC) less than 100   BLOOD PRESSURE American Stroke Association blood pressure target is less that 120/80 mm/Hg  Your discharge blood pressure is:  BP: 110/73  Monitor your blood pressure  Limit your salt and alcohol intake  Many individuals will require more than one medication for high blood pressure  DIABETES (A1c is a blood sugar average for last 3 months) Goal HGBA1c is  under 7% (HBGA1c is blood sugar average for last 3 months)  Diabetes:     Lab Results  Component Value Date   HGBA1C 6.0 (H) 07/03/2017     Your HGBA1c can be lowered with medications, healthy diet, and exercise.  Check your blood sugar as directed by your physician  Call your physician if you experience unexplained or low blood sugars.  PHYSICAL ACTIVITY/REHABILITATION Goal is 30 minutes at least 4 days per week  Activity: Increase activity slowly, and No driving, Therapies: see above Return to work: To be decided on follow up  Activity decreases your risk of heart attack and stroke and makes your heart stronger.  It helps control your weight and blood pressure; helps you relax and can improve your mood.  Participate in a regular exercise program.  Talk with your doctor about the best form of exercise for you  (dancing, walking, swimming, cycling).  DIET/WEIGHT Goal is to maintain a healthy weight  Your discharge diet is: Diet Carb Modified Fluid consistency: Thin; Room service appropriate? Yes liquids Your height is:  Height: 5\' 9"  (175.3 cm) Your current weight is: Weight: 93.1 kg (205 lb 4 oz) Your Body Mass Index (BMI) is:  BMI (Calculated): 30.3  Following the type of diet specifically designed for you will help prevent another stroke.  Your goal weight is:  169 lbs  Your goal Body Mass Index (BMI) is 19-24.  Healthy food habits can help reduce 3 risk factors for stroke:  High cholesterol, hypertension, and excess weight.  RESOURCES Stroke/Support Group:  Call [phone]   STROKE EDUCATION PROVIDED/REVIEWED AND GIVEN TO PATIENT Stroke warning signs and symptoms How to activate emergency medical system (call 911). Medications prescribed at discharge. Need for follow-up after discharge. Personal risk factors for stroke. Pneumonia vaccine given:  Flu vaccine given:  My questions have been answered, the writing is legible, and I understand these instructions.  I will adhere to these goals & educational materials that have been provided to me after my discharge from the hospital.     My questions have been answered and I understand these instructions. I will adhere to these goals and the provided educational materials after my discharge from the hospital.  Patient/Caregiver Signature _______________________________ Date __________  Clinician Signature _______________________________________ Date __________  Please bring this form and your medication list with you to all your follow-up doctor's appointments.    Information on my medicine - Coumadin   (Warfarin)  This medication education was reviewed with me or my healthcare representative as part of my discharge preparation.   Why was Coumadin prescribed for you? Coumadin was prescribed for you because you have a blood clot or a  medical condition that can cause an increased risk of forming blood clots. Blood clots can cause serious health problems by blocking the flow of blood to the heart, lung, or brain. Coumadin can prevent harmful blood clots from forming.  What test will check on my response to Coumadin? While on Coumadin (warfarin) you will need to have an INR test regularly to ensure that your dose is keeping you in the desired range. The INR (international normalized ratio) number is calculated from the result of the laboratory test called prothrombin time (PT).  If an INR APPOINTMENT HAS NOT ALREADY BEEN MADE FOR YOU please schedule an appointment to have this lab work done by your health care provider within 7 days. Your INR goal is usually a number between:  2 to 3 or your provider may  give you a more narrow range like 2-2.5.  Ask your health care provider during an office visit what your goal INR is.  What  do you need to  know  About  COUMADIN? Take Coumadin (warfarin) exactly as prescribed by your healthcare provider about the same time each day.  DO NOT stop taking without talking to the doctor who prescribed the medication.  Stopping without other blood clot prevention medication to take the place of Coumadin may increase your risk of developing a new clot or stroke.  Get refills before you run out.  What do you do if you miss a dose? If you miss a dose, take it as soon as you remember on the same day then continue your regularly scheduled regimen the next day.  Do not take two doses of Coumadin at the same time.  Important Safety Information A possible side effect of Coumadin (Warfarin) is an increased risk of bleeding. You should call your healthcare provider right away if you experience any of the following: ? Bleeding from an injury or your nose that does not stop. ? Unusual colored urine (red or dark brown) or unusual colored stools (red or black). ? Unusual bruising for unknown reasons. ? A serious  fall or if you hit your head (even if there is no bleeding).  Some foods or medicines interact with Coumadin (warfarin) and might alter your response to warfarin. To help avoid this: ? Eat a balanced diet, maintaining a consistent amount of Vitamin K. ? Notify your provider about major diet changes you plan to make. ? Avoid alcohol or limit your intake to 1 drink for women and 2 drinks for men per day. (1 drink is 5 oz. wine, 12 oz. beer, or 1.5 oz. liquor.)  Make sure that ANY health care provider who prescribes medication for you knows that you are taking Coumadin (warfarin).  Also make sure the healthcare provider who is monitoring your Coumadin knows when you have started a new medication including herbals and non-prescription products.  Coumadin (Warfarin)  Major Drug Interactions  Increased Warfarin Effect Decreased Warfarin Effect  Alcohol (large quantities) Antibiotics (esp. Septra/Bactrim, Flagyl, Cipro) Amiodarone (Cordarone) Aspirin (ASA) Cimetidine (Tagamet) Megestrol (Megace) NSAIDs (ibuprofen, naproxen, etc.) Piroxicam (Feldene) Propafenone (Rythmol SR) Propranolol (Inderal) Isoniazid (INH) Posaconazole (Noxafil) Barbiturates (Phenobarbital) Carbamazepine (Tegretol) Chlordiazepoxide (Librium) Cholestyramine (Questran) Griseofulvin Oral Contraceptives Rifampin Sucralfate (Carafate) Vitamin K   Coumadin (Warfarin) Major Herbal Interactions  Increased Warfarin Effect Decreased Warfarin Effect  Garlic Ginseng Ginkgo biloba Coenzyme Q10 Green tea St. Johns wort    Coumadin (Warfarin) FOOD Interactions  Eat a consistent number of servings per week of foods HIGH in Vitamin K (1 serving =  cup)  Collards (cooked, or boiled & drained) Kale (cooked, or boiled & drained) Mustard greens (cooked, or boiled & drained) Parsley *serving size only =  cup Spinach (cooked, or boiled & drained) Swiss chard (cooked, or boiled & drained) Turnip greens (cooked, or  boiled & drained)  Eat a consistent number of servings per week of foods MEDIUM-HIGH in Vitamin K (1 serving = 1 cup)  Asparagus (cooked, or boiled & drained) Broccoli (cooked, boiled & drained, or raw & chopped) Brussel sprouts (cooked, or boiled & drained) *serving size only =  cup Lettuce, raw (green leaf, endive, romaine) Spinach, raw Turnip greens, raw & chopped   These websites have more information on Coumadin (warfarin):  http://www.king-russell.com/; https://www.hines.net/;

## 2017-07-08 NOTE — Progress Notes (Signed)
ANTICOAGULATION CONSULT NOTE - Follow Up Consult  Pharmacy Consult for Heparin  Indication: stroke and LV thrombus  No Known Allergies  Patient Measurements: Height: 5\' 9"  (175.3 cm) Weight: 190 lb 4.8 oz (86.3 kg) IBW/kg (Calculated) : 70.7  Vital Signs: Temp: 97.6 F (36.4 C) (12/09 0540) Temp Source: Oral (12/09 0540) BP: 102/65 (12/09 0540) Pulse Rate: 77 (12/09 0540)  Labs: Recent Labs    07/06/17 0318 07/07/17 0223 07/07/17 0225 07/07/17 1158 07/08/17 0532  HGB 16.4 16.2  --   --  15.5  HCT 49.0 47.7  --   --  45.9  PLT 300 292  --   --  279  LABPROT  --  13.1  --   --  22.3*  INR  --  1.00  --   --  1.97  HEPARINUNFRC  --   --  0.23* 0.33 0.29*  CREATININE 0.94 0.93  --   --   --     Estimated Creatinine Clearance: 105.7 mL/min (by C-G formula based on SCr of 0.93 mg/dL).  Assessment: 46 y/o M s/p acute embolic stroke, also found to have LV thrombus, on heparin/warfarin bridge. Heparin level this morning is 0.29, just slightly below goal range. INR came back as 1.97, also slightly below target goal. Hemoglobin and platelets dropped but still WNL. No signs or symptoms of bleeding noted at this time. Per nurse, no concerns with heparin drip.   Goal of Therapy:  Heparin level 0.3-0.5 units/mL INR goal 2-3 Monitor platelets by anticoagulation protocol: Yes   Plan:  Discussed with Dr. Gala RomneyBensimhon.  Given quick rise in INR, and risk of bleeding given recent stroke, will stop heparin and hold Coumadin tonight.  Anticipate further INR rise tomorrow.  Tad MooreJessica Jaymere Alen, Pharm D, BCPS  Clinical Pharmacist Pager [phone]  07/08/2017 12:46 PM

## 2017-07-09 ENCOUNTER — Inpatient Hospital Stay (HOSPITAL_COMMUNITY): Payer: Managed Care, Other (non HMO) | Admitting: Physical Therapy

## 2017-07-09 ENCOUNTER — Inpatient Hospital Stay (HOSPITAL_COMMUNITY): Payer: Managed Care, Other (non HMO)

## 2017-07-09 ENCOUNTER — Inpatient Hospital Stay (HOSPITAL_COMMUNITY): Payer: Managed Care, Other (non HMO) | Admitting: Occupational Therapy

## 2017-07-09 LAB — CBC
HCT: 47.5 % (ref 39.0–52.0)
Hemoglobin: 15.3 g/dL (ref 13.0–17.0)
MCH: 30.4 pg (ref 26.0–34.0)
MCHC: 32.2 g/dL (ref 30.0–36.0)
MCV: 94.2 fL (ref 78.0–100.0)
PLATELETS: 281 10*3/uL (ref 150–400)
RBC: 5.04 MIL/uL (ref 4.22–5.81)
RDW: 14.2 % (ref 11.5–15.5)
WBC: 9.4 10*3/uL (ref 4.0–10.5)

## 2017-07-09 LAB — PROTIME-INR
INR: 2.55
Prothrombin Time: 27.2 seconds — ABNORMAL HIGH (ref 11.4–15.2)

## 2017-07-09 LAB — HEPARIN LEVEL (UNFRACTIONATED)

## 2017-07-09 NOTE — Progress Notes (Signed)
Speech Language Pathology Daily Session Note  Patient Details  Name: Angel Davies MRN: [id number] Date of Birth: 06/02/1971  Today's Date: 07/09/2017 SLP Individual Time: 1100-1158 SLP Individual Time Calculation (min): 58 min  Short Term Goals: Week 1: SLP Short Term Goal 1 (Week 1): STGs=LTGs  Skilled Therapeutic Interventions: Skilled ST services focused on speech intelligibility skills. SLP facilitated speech intelligibility    in complex conversation pertaining to progression of recovery following CVA, job requirements and personal interest, pt demonstrated min slurred speech, however was able to self correct at Mod I level. Pt demonstrated independent recall and use of speech intelligibility.SLP recommends discharge during next session and to complete education of carryover strategies if Mod I level continues. Pt was left in bed with wife in room. Recommend to continue skilled ST services.   Function:  Eating Eating                 Cognition Comprehension Comprehension assist level: Follows complex conversation/direction with no assist  Expression   Expression assist level: Expresses complex 90% of the time/cues < 10% of the time  Social Interaction Social Interaction assist level: Interacts appropriately 90% of the time - Needs monitoring or encouragement for participation or interaction.  Problem Solving Problem solving assist level: Solves basic problems with no assist  Memory Memory assist level: Recognizes or recalls 90% of the time/requires cueing < 10% of the time    Pain Pain Assessment Pain Assessment: No/denies pain  Therapy/Group: Individual Therapy  [name] 07/09/2017, 12:12 PM

## 2017-07-09 NOTE — IPOC Note (Addendum)
Overall Plan of Care [facility](IPOC) Patient Details Name: Angel Davies MRN: [id number] DOB: 02/03/1971  Admitting Diagnosis: <principal problem not specified>  Hospital Problems: Active Problems:   Right pontine stroke (HCC)   Left hemiparesis (HCC)   Cardiomyopathy, ischemic   LV (left ventricular) mural thrombus following MI [facility](HCC)     Functional Problem List: Nursing Bowel, Endurance, Motor, Medication Management, Safety, Sensory  PT Balance, Behavior, Edema, Endurance, Motor, Nutrition, Pain, Perception, Safety, Sensory, Skin Integrity  OT Balance, Behavior, Safety, Perception, Endurance, Motor, Vision  SLP    TR         Basic ADL's: OT Grooming, Bathing, Dressing, Toileting     Advanced  ADL's: OT Simple Meal Preparation     Transfers: PT Bed Mobility, Bed to Chair, Car, Floor, Occupational psychologisturniture  OT Toilet, Research scientist (life sciences)Tub/Shower     Locomotion: PT Ambulation, Stairs, Psychologist, prison and probation servicesWheelchair Mobility     Additional Impairments: OT Fuctional Use of Upper Extremity  SLP Communication expression    TR      Anticipated Outcomes Item Anticipated Outcome  Self Feeding MOD I  Swallowing      Basic self-care  MOD I  Toileting  MOD I   Bathroom Transfers MOD I toilet; S shower  Bowel/Bladder  Pt will manage bowel and bladder with mod I assist at discharge   Transfers  Mod I with LRAD   Locomotion  Ambulatory at supervision level.   Communication  Mod I   Cognition     Pain  Pt will manage pain at 3 or less while in rehab.   Safety/Judgment  Pt will remain free of skin breakdown and infection with min assist/cues.    Therapy Plan: PT Intensity: Minimum of 1-2 x/day ,45 to 90 minutes PT Frequency: 5 out of 7 days PT Duration Estimated Length of Stay: 10-14 days  OT Intensity: Minimum of 1-2 x/day, 45 to 90 minutes OT Frequency: 5 out of 7 days OT Duration/Estimated Length of Stay: 10-14 SLP Intensity: Minumum of 1-2 x/day, 30 to 90 minutes SLP Frequency: 1 to 3 out of 7 days SLP  Duration/Estimated Length of Stay: ~1 week for SLP    Team Interventions: Nursing Interventions Patient/Family Education, Discharge Planning, Medication Management, Pain Management, Bowel Management, Disease Management/Prevention, Cognitive Remediation/Compensation  PT interventions Ambulation/gait training, Community reintegration, DME/adaptive equipment instruction, Neuromuscular re-education, Psychosocial support, Stair training, UE/LE Strength taining/ROM, Wheelchair propulsion/positioning, Warden/rangerBalance/vestibular training, Discharge planning, Functional electrical stimulation, Pain management, Skin care/wound management, Therapeutic Activities, UE/LE Coordination activities, Cognitive remediation/compensation, Functional mobility training, Disease management/prevention, Patient/family education, Splinting/orthotics, Visual/perceptual remediation/compensation, Therapeutic Exercise  OT Interventions Balance/vestibular training, Discharge planning, Functional electrical stimulation, Pain management, Self Care/advanced ADL retraining, Therapeutic Activities, UE/LE Coordination activities, Functional mobility training, Disease mangement/prevention, Patient/family education, Therapeutic Exercise, Visual/perceptual remediation/compensation, FirefighterCommunity reintegration, Fish farm managerDME/adaptive equipment instruction, Neuromuscular re-education, Psychosocial support, Splinting/orthotics, UE/LE Strength taining/ROM, Wheelchair propulsion/positioning  SLP Interventions Speech/Language facilitation, Financial traderCueing hierarchy, Functional tasks, Patient/family education, Therapeutic Activities, Environmental controls  TR Interventions    SW/CM Interventions Discharge Planning, Psychosocial Support, Patient/Family Education   Barriers to Discharge MD  Medical stability  Nursing      PT Inaccessible home environment, Decreased caregiver support    OT Decreased caregiver support wife works  SLP      SW       Team Discharge  Planning: Destination: PT-Home ,OT- Home , SLP-Home Projected Follow-up: PT-Home health PT, OT-  Home health OT, SLP-None Projected Equipment Needs: PT-Rolling walker with 5" wheels, Wheelchair cushion (measurements), Wheelchair (measurements), To be determined, OT-  To be determined, SLP-None recommended by SLP Equipment Details: PT- , OT-pt reports having TTB from father Patient/family involved in discharge planning: PT- Patient,  OT-Patient, SLP-Patient, Family member/caregiver  MD ELOS: 10-14d Medical Rehab Prognosis:  Good Assessment:  46 y.o. male with history of ADHD, anxiety, depression/anxiety, fall 12/2016 with loss of peripheral vision and onset of HA, polysubtance abuse who was admitted on 07/02/17 with fall, slurred speech and left sided weakness. Patient reported that he was drinking ETOH and used cocaine the night before--UDS positive for cocaine and THC. CT head negative. MRI/MRA brain done revealing " Acute RIGHT pontine infarct, Subacute small RIGHT temporal occipital lobe/PCA territory infarct and severe stenosis distal rigth P2 segment. CTA head/neck was negative for emergent vascular finding, 40% left SA stenosis due to plaque and emphysema. 2D echo showed EF 20-25% with moderately dilated LV, akinesis of anterior and apical myocardium with grade 2 diastolic dysfunction.  He was evaluated by Dr. Gala RomneyBensimhon and underwent cardiac cath on 12/6 revealing severe CAD with occluded mid LAD and well compensated filling pressures with moderately reduced cardiac output. Cardiac MRI ordered today for work up of decrease in EF out of proportion to CAD.  MRI reveals severe left ventricular dysfunction with ejection fraction of 18% and septal/apical akinesis with diffuse hypokinesis.  Also notable is an apical thrombus measuring 11 mm x 8 mm and a full-thickness scar involving the left mid and distal septum and apex.    Now requiring 24/7 Rehab RN,MD, as well as CIR level PT, OT and SLP.  Treatment  team will focus on ADLs and mobility with goals set at Mod I   See Team Conference Notes for weekly updates to the plan of care

## 2017-07-09 NOTE — Progress Notes (Signed)
Subjective/Complaints: No issues overnite Pt moving L arm higher  ROS-  No CP, SOB, N/V/D  Objective: Vital Signs: Blood pressure 92/60, pulse 66, temperature 97.8 F (36.6 C), temperature source Oral, resp. rate 16, height _0  (1.753 m), weight 86.3 kg (190 lb 4.8 oz), SpO2 97 %. No results found. Results for orders placed or performed during the hospital encounter of 07/06/17 (from the past 72 hour(s))  Protime-INR     Status: None   Collection Time: 07/07/17  2:23 AM  Result Value Ref Range   Prothrombin Time 13.1 11.4 - 15.2 seconds   INR 1.00   CBC WITH DIFFERENTIAL     Status: Abnormal   Collection Time: 07/07/17  2:23 AM  Result Value Ref Range   WBC 12.2 (H) 4.0 - 10.5 K/uL   RBC 5.22 4.22 - 5.81 MIL/uL   Hemoglobin 16.2 13.0 - 17.0 g/dL   HCT 47.7 39.0 - 52.0 %   MCV 91.4 78.0 - 100.0 fL   MCH 31.0 26.0 - 34.0 pg   MCHC 34.0 30.0 - 36.0 g/dL   RDW 13.5 11.5 - 15.5 %   Platelets 292 150 - 400 K/uL   Neutrophils Relative % 40 %   Neutro Abs 4.9 1.7 - 7.7 K/uL   Lymphocytes Relative 48 %   Lymphs Abs 5.9 (H) 0.7 - 4.0 K/uL   Monocytes Relative 10 %   Monocytes Absolute 1.2 (H) 0.1 - 1.0 K/uL   Eosinophils Relative 2 %   Eosinophils Absolute 0.2 0.0 - 0.7 K/uL   Basophils Relative 0 %   Basophils Absolute 0.0 0.0 - 0.1 K/uL  Comprehensive metabolic panel     Status: Abnormal   Collection Time: 07/07/17  2:23 AM  Result Value Ref Range   Sodium 136 135 - 145 mmol/L   Potassium 3.8 3.5 - 5.1 mmol/L   Chloride 104 101 - 111 mmol/L   CO2 23 22 - 32 mmol/L   Glucose, Bld 134 (H) 65 - 99 mg/dL   BUN 12 6 - 20 mg/dL   Creatinine, Ser 0.93 0.61 - 1.24 mg/dL   Calcium 9.0 8.9 - 10.3 mg/dL   Total Protein 6.7 6.5 - 8.1 g/dL   Albumin 3.8 3.5 - 5.0 g/dL   AST 26 15 - 41 U/L   ALT 34 17 - 63 U/L   Alkaline Phosphatase 78 38 - 126 U/L   Total Bilirubin 0.4 0.3 - 1.2 mg/dL   GFR calc non Af Amer >60 >60 mL/min   GFR calc Af Amer >60 >60 mL/min    Comment:  (NOTE) The eGFR has been calculated using the CKD EPI equation. This calculation has not been validated in all clinical situations. eGFR's persistently <60 mL/min signify possible Chronic Kidney Disease.    Anion gap 9 5 - 15  Heparin level (unfractionated)     Status: Abnormal   Collection Time: 07/07/17  2:25 AM  Result Value Ref Range   Heparin Unfractionated 0.23 (L) 0.30 - 0.70 IU/mL    Comment:        IF HEPARIN RESULTS ARE BELOW EXPECTED VALUES, AND PATIENT DOSAGE HAS BEEN CONFIRMED, SUGGEST FOLLOW UP TESTING OF ANTITHROMBIN III LEVELS.   Heparin level (unfractionated)     Status: None   Collection Time: 07/07/17 11:58 AM  Result Value Ref Range   Heparin Unfractionated 0.33 0.30 - 0.70 IU/mL    Comment:        IF HEPARIN RESULTS ARE BELOW EXPECTED VALUES, AND  PATIENT DOSAGE HAS BEEN CONFIRMED, SUGGEST FOLLOW UP TESTING OF ANTITHROMBIN III LEVELS.   Protime-INR     Status: Abnormal   Collection Time: 07/08/17  5:32 AM  Result Value Ref Range   Prothrombin Time 22.3 (H) 11.4 - 15.2 seconds   INR 1.97   CBC     Status: Abnormal   Collection Time: 07/08/17  5:32 AM  Result Value Ref Range   WBC 11.4 (H) 4.0 - 10.5 K/uL   RBC 4.99 4.22 - 5.81 MIL/uL   Hemoglobin 15.5 13.0 - 17.0 g/dL   HCT 45.9 39.0 - 52.0 %   MCV 92.0 78.0 - 100.0 fL   MCH 31.1 26.0 - 34.0 pg   MCHC 33.8 30.0 - 36.0 g/dL   RDW 13.7 11.5 - 15.5 %   Platelets 279 150 - 400 K/uL  Heparin level (unfractionated)     Status: Abnormal   Collection Time: 07/08/17  5:32 AM  Result Value Ref Range   Heparin Unfractionated 0.29 (L) 0.30 - 0.70 IU/mL    Comment:        IF HEPARIN RESULTS ARE BELOW EXPECTED VALUES, AND PATIENT DOSAGE HAS BEEN CONFIRMED, SUGGEST FOLLOW UP TESTING OF ANTITHROMBIN III LEVELS.   CBC     Status: None   Collection Time: 07/09/17  4:57 AM  Result Value Ref Range   WBC 9.4 4.0 - 10.5 K/uL   RBC 5.04 4.22 - 5.81 MIL/uL   Hemoglobin 15.3 13.0 - 17.0 g/dL   HCT 47.5 39.0 -  52.0 %   MCV 94.2 78.0 - 100.0 fL   MCH 30.4 26.0 - 34.0 pg   MCHC 32.2 30.0 - 36.0 g/dL   RDW 14.2 11.5 - 15.5 %   Platelets 281 150 - 400 K/uL  Heparin level (unfractionated)     Status: Abnormal   Collection Time: 07/09/17  4:57 AM  Result Value Ref Range   Heparin Unfractionated <0.10 (L) 0.30 - 0.70 IU/mL    Comment:        IF HEPARIN RESULTS ARE BELOW EXPECTED VALUES, AND PATIENT DOSAGE HAS BEEN CONFIRMED, SUGGEST FOLLOW UP TESTING OF ANTITHROMBIN III LEVELS.   Protime-INR     Status: Abnormal   Collection Time: 07/09/17  4:57 AM  Result Value Ref Range   Prothrombin Time 27.2 (H) 11.4 - 15.2 seconds   INR 2.55      HEENT: normal Cardio: RRR and no murmur Resp: CTA B/L and unlabored GI: BS positive and NT, ND Extremity:  Pulses positive and No Edema Skin:   Intact and Other IV site ok Neuro: Alert/Oriented, Normal Sensory, Abnormal Motor 3 left delt bi tri FF, 3+ L HF, KE 0 ADF and Abnormal FMC, no visual field def Ataxic/ dec FMC Musc/Skel:  Normal Gen NAD   Assessment/Plan: 1. 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 Physiatrist is providing close team supervision and 24 hour management of active medical problems listed below. Physiatrist and rehab team continue to assess barriers to discharge/monitor patient progress toward functional and medical goals. FIM: Function - Bathing Bathing activity did not occur: Refused  Function- Upper Body Dressing/Undressing What is the patient wearing?: Pull over shirt/dress Pull over shirt/dress - Perfomed by patient: Thread/unthread right sleeve, Put head through opening Pull over shirt/dress - Perfomed by helper: Thread/unthread left sleeve Assist Level: Touching or steadying assistance(Pt > 75%) Function - Lower Body Dressing/Undressing What is  the patient wearing?: Non-skid slipper socks,  Pants Pants- Performed by patient: Thread/unthread right pants leg, Pull pants up/down Pants- Performed by helper: Thread/unthread left pants leg Non-skid slipper socks- Performed by helper: Don/doff right sock, Don/doff left sock Assist for lower body dressing: Touching or steadying assistance (Pt > 75%)  Function - Toileting Toileting steps completed by patient: Performs perineal hygiene Toileting steps completed by helper: Adjust clothing prior to toileting, Performs perineal hygiene, Adjust clothing after toileting Toileting Assistive Devices: Grab bar or rail Assist level: Touching or steadying assistance (Pt.75%)  Function - Air cabin crew transfer assistive device: Grab bar Assist level to toilet: Touching or steadying assistance (Pt > 75%) Assist level from toilet: Touching or steadying assistance (Pt > 75%)  Function - Chair/bed transfer Chair/bed transfer method: Stand pivot Chair/bed transfer assist level: Touching or steadying assistance (Pt > 75%) Chair/bed transfer assistive device: Armrests  Function - Locomotion: Wheelchair Will patient use wheelchair at discharge?: Yes Type: Manual Max wheelchair distance: 115f Assist Level: Touching or steadying assistance (Pt > 75%) Assist Level: Touching or steadying assistance (Pt > 75%) Wheel 150 feet activity did not occur: Safety/medical concerns Function - Locomotion: Ambulation Assistive device: No device Max distance: 494f Assist level: Moderate assist (Pt 50 - 74%) Assist level: Moderate assist (Pt 50 - 74%) Walk 50 feet with 2 turns activity did not occur: Safety/medical concerns Walk 150 feet activity did not occur: Safety/medical concerns Walk 10 feet on uneven surfaces activity did not occur: Safety/medical concerns  Function - Comprehension Comprehension: Auditory Comprehension assist level: Understands basic 75 - 89% of the time/ requires cueing 10 - 24% of the time  Function -  Expression Expression: Verbal Expression assist level: Expresses basic 90% of the time/requires cueing < 10% of the time.  Function - Social Interaction Social Interaction assist level: Interacts appropriately 90% of the time - Needs monitoring or encouragement for participation or interaction.  Function - Problem Solving Problem solving assist level: Solves basic 75 - 89% of the time/requires cueing 10 - 24% of the time  Function - Memory Memory assist level: Recognizes or recalls 90% of the time/requires cueing < 10% of the time Patient normally able to recall (first 3 days only): Current season, Location of own room, Staff names and faces, That he or she is in a hospital  Medical Problem List and Plan:  1. Functional deficits and left hemiparesis secondary to acute right pontine infarct/subacute right temporal/occipital infarcts  CIR PT, OT, SLP - Cont CIR 2. DVT Prophylaxis/Anticoagulation: Pharmaceutical: INR therapeutic, off heparin 3. Pain Management: tylenol prn  4. Mood: LCSW to follow for evaluation and support.  Will ask neuropsychology to-follow-up with the patient as he is expressing signs and symptoms of depression  5. Neuropsych: This patient is capable of making decisions on his own behalf.  6. Skin/Wound Care: Routine pressure relief measures.  7. Fluids/Electrolytes/Nutrition: Monitor I/O. Lytes normal 12/8  8. Severe CAD with CM: Treated medically with ASA, statin  9. Acute systolic CHF: Was started spironolactone and digoxin on 12/7. BP soft , Cardiology to follow, avoid hypotension/hypoperfusion. Vitals:   07/08/17 1540 07/09/17 0549  BP: 102/66 92/60  Pulse: 92 66  Resp: 16 16  Temp: 98.4 F (36.9 C) 97.8 F (36.6 C)  SpO2: 94% 97%   10.  Apical throbus IV heparin->warfarin, INR 2.55 10. Prediabetes: Hgb A1C- 6.0. Will have dietitian educate patient on CM diet--monitor BS for trends.  11. Leukocytosis Resolved:Monitor for signs of infection. WBCs  down to  9.4 12. Polysubstance abuse: Has been counseled on importance of alcohol, cocaine and THC cessation    LOS (Days) 3 A FACE TO FACE EVALUATION WAS PERFORMED  Charlett Blake 07/09/2017, 10:22 AM

## 2017-07-09 NOTE — Progress Notes (Signed)
Advanced Heart Failure Rounding Note  Primary Cardiologist: New (Dr. Gala RomneyBensimhon)  Subjective:     Doing well. Continues to have some word-finding difficulties and left-sided weakness. No CP or SOB. INR 2.5  cMRI on 12/7 with 1) Severe LVE with EF 18% septal and apical akinesis diffuse hypokinesis 2) Apical thrombus measuring 11 mm x 8 mm 3) Full thickness scar involving the mid and distal septum and apex Subendocardial scar involving the basal inferior lateral wall    R/LHC 07/05/17  Mid RCA to Dist RCA lesion is 100% stenosed.  Dist Cx lesion is 100% stenosed.  Mid LAD lesion is 100% stenosed.  Prox Cx to Dist Cx lesion is 30% stenosed.   Findings:  Ao = 104/68 (84) LV = 110/9 RA = 1 RV = 19/3 PA = 19/9 (11) PCW = 5 Fick cardiac output/index = 4.3/2.1 PVR = 1.5 WU SVR = 2122 Ao sat = 96% PA sat = 64%, 66%  Assessment: 1. Severe CAD with left-dominant system and occluded mLAD 2. Ischemic CM with EF 25% by echo 3. Well compensated filling pressures with moderately reduced cardiac output  Objective:   Weight Range: 86.3 kg (190 lb 4.8 oz) Body mass index is 28.1 kg/m.   Vital Signs:   Temp:  [97.8 F (36.6 C)-98.4 F (36.9 C)] 97.8 F (36.6 C) (12/10 0549) Pulse Rate:  [66-92] 66 (12/10 0549) Resp:  [16] 16 (12/10 0549) BP: (92-102)/(60-66) 92/60 (12/10 0549) SpO2:  [94 %-97 %] 97 % (12/10 0549) Last BM Date: 07/07/17  Weight change: Filed Weights   07/06/17 1716  Weight: 86.3 kg (190 lb 4.8 oz)    Intake/Output:   Intake/Output Summary (Last 24 hours) at 07/09/2017 0902 Last data filed at 07/08/2017 1700 Gross per 24 hour  Intake 360 ml  Output -  Net 360 ml      Physical Exam    General:  Well appearing. No resp difficulty HEENT: normal Neck: supple. no JVD. Carotids 2+ bilat; no bruits. No lymphadenopathy or thryomegaly appreciated. Cor: PMI nondisplaced. Regular rate & rhythm. No rubs, gallops or murmurs. Lungs:  clear Abdomen: soft, nontender, nondistended. No hepatosplenomegaly. No bruits or masses. Good bowel sounds. Extremities: no cyanosis, clubbing, rash, edema Neuro: alert & orientedx3, cranial nerves grossly intact. Left-sided weakness. Mild word-finding difficulties. Affect pleasant    Telemetry   N/A  Labs    CBC Recent Labs    07/07/17 0223 07/08/17 0532 07/09/17 0457  WBC 12.2* 11.4* 9.4  NEUTROABS 4.9  --   --   HGB 16.2 15.5 15.3  HCT 47.7 45.9 47.5  MCV 91.4 92.0 94.2  PLT 292 279 281   Basic Metabolic Panel Recent Labs    16/05/9611/08/18 0223  NA 136  K 3.8  CL 104  CO2 23  GLUCOSE 134*  BUN 12  CREATININE 0.93  CALCIUM 9.0   Liver Function Tests Recent Labs    07/07/17 0223  AST 26  ALT 34  ALKPHOS 78  BILITOT 0.4  PROT 6.7  ALBUMIN 3.8   No results for input(s): LIPASE, AMYLASE in the last 72 hours. Cardiac Enzymes No results for input(s): CKTOTAL, CKMB, CKMBINDEX, TROPONINI in the last 72 hours.  BNP: BNP (last 3 results) No results for input(s): BNP in the last 8760 hours.  ProBNP (last 3 results) No results for input(s): PROBNP in the last 8760 hours.   D-Dimer No results for input(s): DDIMER in the last 72 hours. Hemoglobin A1C No results for  input(s): HGBA1C in the last 72 hours. Fasting Lipid Panel No results for input(s): CHOL, HDL, LDLCALC, TRIG, CHOLHDL, LDLDIRECT in the last 72 hours. Thyroid Function Tests No results for input(s): TSH, T4TOTAL, T3FREE, THYROIDAB in the last 72 hours.  Invalid input(s): FREET3  Other results:   Imaging    No results found.   Medications:     Scheduled Medications: . atorvastatin  80 mg Oral q1800  . carvedilol  3.125 mg Oral BID WC  . digoxin  0.125 mg Oral Daily  . fenofibrate  160 mg Oral Daily  . folic acid  1 mg Oral Daily  . losartan  25 mg Oral Daily  . multivitamin with minerals  1 tablet Oral Daily  . nicotine  14 mg Transdermal Daily  . spironolactone  12.5 mg Oral QHS   . thiamine  100 mg Oral Daily  . Warfarin - Pharmacist Dosing Inpatient   Does not apply q1800    Infusions:   PRN Medications: acetaminophen, alum & mag hydroxide-simeth, bisacodyl, diphenhydrAMINE, guaiFENesin-dextromethorphan, polyethylene glycol, prochlorperazine **OR** prochlorperazine **OR** prochlorperazine, sodium phosphate, sorbitol, traZODone    Patient Profile   Angel Davies is a 46 y.o. male with PMH of HTN, ADHD, obesity, anxiety, and depression.   Presented 07/02/17 with CVA. Found to have systolic CHF by echo.  UDS notably + for cocaine.  Assessment/Plan   1. 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 - With EF < 30% and LV thrombus will need AC. - Now on coumadin. INR 2.55. Discussed with PharmD - Appreciate CIR assistance  2. Acute systolic CHF due to ICM.  - Echo 07/02/17 with LVEF 20-25%, grade 2 DD, Trivial MR, Trivial TR - cMRI EF 18% - SBPs in 90s Volume status ok - Continue spiro, losartan, dig and carvedilol. BP too low to titrate. Hold meds for BP < 90  - [facility]  - Will need repeat echo 3 months after med optimization for ICD consideration.   3. CAD - LHC 07/05/17 with severe CAD with left-dominant system and occluded mLAD. - Mostly distal CAD. No good targets for CABG or PCI. Continue medical therapy. Based on scar pattern on MRI, I am hopeful EF will improve moderately   - Continue ASA and statin   4. Cocaine abuse - Stressed importance of complete cessation. He is motivated to change.   5. Alcohol abuse - Stressed importance of complete cessation. He is motivated to change.   6. HLD - Continue atorvastatin 80 mg daily.  - Continue  fenofibrate. No change.   Length of Stay: 3  Arvilla Meresaniel Archer Vise, MD  07/09/2017, 9:02 AM  Advanced Heart Failure Team Pager [phone] (M-F; 7a - 4p)  Please  contact [facility] for night-coverage after hours (4p -7a ) and weekends on [email or web address]

## 2017-07-09 NOTE — Progress Notes (Signed)
Physical Therapy Session Note  Patient Details  Name: Angel Davies MRN: [id number] Date of Birth: 10/28/1970  Today's Date: 07/09/2017 PT Individual Time: 1500-1550 PT Individual Time Calculation (min): 50 min   Short Term Goals: Week 1:  PT Short Term Goal 1 (Week 1): Pt will perform bed mobility with supervision assist PT Short Term Goal 2 (Week 1): Pt will perform trasnfers with supervision assist  PT Short Term Goal 3 (Week 1): Pt will ambulate 1700ft with min assist  PT Short Term Goal 4 (Week 1): Pt will propell WC 17600ft with supervision assist.   Skilled Therapeutic Interventions/Progress Updates:    Patient in wheelchair in family room with wife.  Assisted to therapy gym in wheelchair.  Sit to stand min A and cues. Gait x 60' mod A hand hold A and cues for L knee control, foot placement, etc.  Patient on mat sit to stand/squat with forced use L LE 2 x 5 reps.  Sit to supine assist for L LE,  L LE NMR placing onto step under mat.  Adductor squeezes x 10 5 sec hold, LE on therapy ball lateral trunk rolls and flexion extension cues for L LE control.  Sidelying on L on mat for lateral trunk flexion with facilitation.  Side to sit S and cues.  Patient on RW x 1060' mod A and cues, then applied L hand splint and gait x 100' wife following with w/c mod A and cues.  Assist to room in w/c and left with wife in room.   Therapy Documentation Precautions:  Precautions Precautions: Fall Restrictions Weight Bearing Restrictions: No Pain: Pain Assessment Pain Assessment: No/denies pain   See Function Navigator for Current Functional Status.   Therapy/Group: Individual Therapy  Elray McgregorCynthia Daleyza Gadomski 07/09/2017, 4:11 PM

## 2017-07-09 NOTE — Progress Notes (Signed)
ANTICOAGULATION CONSULT NOTE - Follow Up Consult  Pharmacy Consult for Heparin  Indication: stroke and LV thrombus  No Known Allergies  Patient Measurements: Height: 5\' 9"  (175.3 cm) Weight: 190 lb 4.8 oz (86.3 kg) IBW/kg (Calculated) : 70.7  Vital Signs: Temp: 97.8 F (36.6 C) (12/10 0549) Temp Source: Oral (12/10 0549) BP: 104/66 (12/10 0808) Pulse Rate: 66 (12/10 0549)  Labs: Recent Labs    07/07/17 0223  07/07/17 1158 07/08/17 0532 07/09/17 0457  HGB 16.2  --   --  15.5 15.3  HCT 47.7  --   --  45.9 47.5  PLT 292  --   --  279 281  LABPROT 13.1  --   --  22.3* 27.2*  INR 1.00  --   --  1.97 2.55  HEPARINUNFRC  --    < > 0.33 0.29* <0.10*  CREATININE 0.93  --   --   --   --    < > = values in this interval not displayed.    Estimated Creatinine Clearance: 105.7 mL/min (by C-G formula based on SCr of 0.93 mg/dL).  Assessment: 46 y/o M s/p acute embolic stroke, also found to have LV thrombus, s/p heparin, on warfarin. INR increase from 1.97 to 2.55 today, within therapeutic range. Hemoglobin and platelets stable and WNL. No signs or symptoms of bleeding noted at this time. Received 10 mg x 2 days, held 12/9. Consider starting back at lower dose when appropriate.  Goal of Therapy:  INR goal 2-3 Monitor platelets by anticoagulation protocol: Yes   Plan:  Given quick rise in INR, and risk of bleeding given recent stroke, will hold Coumadin again tonight.   Thank you for allowing us to participate in this patients care.  Signe Coltonya C Jara Feider, PharmD Clinical phone for 07/09/2017 from 7a-3:30p: [phone] If after 3:30p, please call main pharmacy at: [phone] 07/09/2017 1:03 PM

## 2017-07-09 NOTE — H&P (Deleted)
Overall Plan of Care [facility](IPOC) Patient Details Name: Angel Davies MRN: [id number] DOB: 12/15/1970  Admitting Diagnosis: <principal problem not specified>  Hospital Problems: Active Problems:   Right pontine stroke (HCC)   Left hemiparesis (HCC)   Cardiomyopathy, ischemic   LV (left ventricular) mural thrombus following MI [facility](HCC)     Functional Problem List: Nursing Bowel, Endurance, Motor, Medication Management, Safety, Sensory  PT Balance, Behavior, Edema, Endurance, Motor, Nutrition, Pain, Perception, Safety, Sensory, Skin Integrity  OT Balance, Behavior, Safety, Perception, Endurance, Motor, Vision  SLP    TR         Basic ADL's: OT Grooming, Bathing, Dressing, Toileting     Advanced  ADL's: OT Simple Meal Preparation     Transfers: PT Bed Mobility, Bed to Chair, Car, Floor, Occupational psychologisturniture  OT Toilet, Research scientist (life sciences)Tub/Shower     Locomotion: PT Ambulation, Stairs, Psychologist, prison and probation servicesWheelchair Mobility     Additional Impairments: OT Fuctional Use of Upper Extremity  SLP Communication expression    TR      Anticipated Outcomes Item Anticipated Outcome  Self Feeding MOD I  Swallowing      Basic self-care  MOD I  Toileting  MOD I   Bathroom Transfers MOD I toilet; S shower  Bowel/Bladder  Pt will manage bowel and bladder with mod I assist at discharge   Transfers  Mod I with LRAD   Locomotion  Ambulatory at supervision level.   Communication  Mod I   Cognition     Pain  Pt will manage pain at 3 or less while in rehab.   Safety/Judgment  Pt will remain free of skin breakdown and infection with min assist/cues.    Therapy Plan: PT Intensity: Minimum of 1-2 x/day ,45 to 90 minutes PT Frequency: 5 out of 7 days PT Duration Estimated Length of Stay: 10-14 days  OT Intensity: Minimum of 1-2 x/day, 45 to 90 minutes OT Frequency: 5 out of 7 days OT Duration/Estimated Length of Stay: 10-14 SLP Intensity: Minumum of 1-2 x/day, 30 to 90 minutes SLP Frequency: 1 to 3 out of 7 days SLP  Duration/Estimated Length of Stay: ~1 week for SLP    Team Interventions: Nursing Interventions Patient/Family Education, Discharge Planning, Medication Management, Pain Management, Bowel Management, Disease Management/Prevention, Cognitive Remediation/Compensation  PT interventions Ambulation/gait training, Community reintegration, DME/adaptive equipment instruction, Neuromuscular re-education, Psychosocial support, Stair training, UE/LE Strength taining/ROM, Wheelchair propulsion/positioning, Warden/rangerBalance/vestibular training, Discharge planning, Functional electrical stimulation, Pain management, Skin care/wound management, Therapeutic Activities, UE/LE Coordination activities, Cognitive remediation/compensation, Functional mobility training, Disease management/prevention, Patient/family education, Splinting/orthotics, Visual/perceptual remediation/compensation, Therapeutic Exercise  OT Interventions Balance/vestibular training, Discharge planning, Functional electrical stimulation, Pain management, Self Care/advanced ADL retraining, Therapeutic Activities, UE/LE Coordination activities, Functional mobility training, Disease mangement/prevention, Patient/family education, Therapeutic Exercise, Visual/perceptual remediation/compensation, FirefighterCommunity reintegration, Fish farm managerDME/adaptive equipment instruction, Neuromuscular re-education, Psychosocial support, Splinting/orthotics, UE/LE Strength taining/ROM, Wheelchair propulsion/positioning  SLP Interventions Speech/Language facilitation, Financial traderCueing hierarchy, Functional tasks, Patient/family education, Therapeutic Activities, Environmental controls  TR Interventions    SW/CM Interventions     Barriers to Discharge MD  Medical stability  Nursing      PT Inaccessible home environment, Decreased caregiver support    OT Decreased caregiver support wife works  SLP      SW       Team Discharge Planning: Destination: PT-Home ,OT- Home , SLP-Home Projected Follow-up:  PT-Home health PT, OT-  Home health OT, SLP-None Projected Equipment Needs: PT-Rolling walker with 5" wheels, Wheelchair cushion (measurements), Wheelchair (measurements), To be determined, OT- To be determined, SLP-None  recommended by SLP Equipment Details: PT- , OT-pt reports having TTB from father Patient/family involved in discharge planning: PT- Patient,  OT-Patient, SLP-Patient, Family member/caregiver  MD ELOS: 10-14d Medical Rehab Prognosis:  Excellent Assessment:  46 y.o. male with history of ADHD, anxiety, depression/anxiety, fall 12/2016 with loss of peripheral vision and onset of HA, polysubtance abuse who was admitted on 07/02/17 with fall, slurred speech and left sided weakness. Patient reported that he was drinking ETOH and used cocaine the night before--UDS positive for cocaine and THC. CT head negative. MRI/MRA brain done revealing " Acute RIGHT pontine infarct, Subacute small RIGHT temporal occipital lobe/PCA territory infarct and severe stenosis distal rigth P2 segment. CTA head/neck was negative for emergent vascular finding, 40% left SA stenosis due to plaque and emphysema. 2D echo showed EF 20-25% with moderately dilated LV, akinesis of anterior and apical myocardium with grade 2 diastolic dysfunction.  He was evaluated by Dr. Gala RomneyBensimhon and underwent cardiac cath on 12/6 revealing severe CAD with occluded mid LAD and well compensated filling pressures with moderately reduced cardiac output. Cardiac MRI ordered today for work up of decrease in EF out of proportion to CAD.  MRI reveals severe left ventricular dysfunction with ejection fraction of 18% and septal/apical akinesis with diffuse hypokinesis.  Also notable is an apical thrombus measuring 11 mm x 8 mm and a full-thickness scar involving the left mid and distal septum and apex.      Now requiring 24/7 Rehab RN,MD, as well as CIR level PT, OT and SLP.  Treatment team will focus on ADLs and mobility with goals set at Mod I See  Team Conference Notes for weekly updates to the plan of care

## 2017-07-10 ENCOUNTER — Inpatient Hospital Stay (HOSPITAL_COMMUNITY): Payer: Managed Care, Other (non HMO) | Admitting: Speech Pathology

## 2017-07-10 ENCOUNTER — Inpatient Hospital Stay (HOSPITAL_COMMUNITY): Payer: Managed Care, Other (non HMO) | Admitting: Occupational Therapy

## 2017-07-10 ENCOUNTER — Inpatient Hospital Stay (HOSPITAL_COMMUNITY): Payer: Managed Care, Other (non HMO) | Admitting: Physical Therapy

## 2017-07-10 LAB — CBC
HEMATOCRIT: 48 % (ref 39.0–52.0)
HEMOGLOBIN: 15.4 g/dL (ref 13.0–17.0)
MCH: 30.3 pg (ref 26.0–34.0)
MCHC: 32.1 g/dL (ref 30.0–36.0)
MCV: 94.5 fL (ref 78.0–100.0)
Platelets: 277 10*3/uL (ref 150–400)
RBC: 5.08 MIL/uL (ref 4.22–5.81)
RDW: 14 % (ref 11.5–15.5)
WBC: 10.3 10*3/uL (ref 4.0–10.5)

## 2017-07-10 LAB — PROTIME-INR
INR: 2.23
PROTHROMBIN TIME: 24.5 s — AB (ref 11.4–15.2)

## 2017-07-10 MED ORDER — WARFARIN SODIUM 5 MG PO TABS
5.0000 mg | ORAL_TABLET | Freq: Once | ORAL | Status: AC
  Administered 2017-07-10: 5 mg via ORAL
  Filled 2017-07-10: qty 1

## 2017-07-10 NOTE — Progress Notes (Signed)
Patient information reviewed and entered into eRehab system by Vonetta Foulk, RN, CRRN, PPS Coordinator.  Information including medical coding and functional independence measure will be reviewed and updated through discharge.    

## 2017-07-10 NOTE — Progress Notes (Signed)
Occupational Therapy Session Note  Patient Details  Name: Angel Davies MRN: [id number] Date of Birth: [birth year]  Today's Date: 07/10/2017 OT Individual Time: 1045-1200 OT Individual Time Calculation (min): 75 min    Short Term Goals: Week 1:  OT Short Term Goal 1 (Week 1): Pt will stand at sink to groom 2/2 items wiht supervision OT Short Term Goal 2 (Week 1): Pt will don shirt with supervision and no more than 1 cue for hemi techniques OT Short Term Goal 3 (Week 1): Pt will fasten pants to improve [place] wiht up to min A OT Short Term Goal 4 (Week 1): Pt will transfer to toilet/BSC with supervision and LRAD OT Short Term Goal 5 (Week 1): Pt will bathe 50% of body with LUE and AE PRN  Skilled Therapeutic Interventions/Progress Updates:    OT treatment session focused on modified bathing/dressing, L NMR, and standing balance/endurance. Pt greeted sitting at the sink shaving with electric razor upon OT arrival. Worked on standing balance/endurance and incorporated L UE weight bearing through counter top while shaving using R hand. Stand-pivot onto tub bench in shower with min A and grab bars. Incorporated L NMR techniques within bathing tasks with pt needing elbow support to elicit more distal control of L Hand. OT reviewed hemi-dressing techniques with pt and worked on implementing those strategies within UB and LB dressing tasks wc level at the sink. Facilitated weight shift through LLE while standing to pull pants up over hips. Pt left seated in wc at end of session with lunch tray set-up and needs met.   Therapy Documentation Precautions:  Precautions Precautions: Fall Restrictions Weight Bearing Restrictions: No \Pain:  none/denies pain  See Function Navigator for Current Functional Status.   Therapy/Group: Individual Therapy  [name] 07/10/2017, 11:20 AM

## 2017-07-10 NOTE — Progress Notes (Signed)
Advanced Heart Failure Rounding Note  Primary Cardiologist: New (Dr. Gala Romney)  Subjective:    Doing well overall. Moving his left arm better. Slowly progressing. Denies CP or SOB.   INR 2.23  cMRI on 12/7 with 1) Severe LVE with EF 18% septal and apical akinesis diffuse hypokinesis 2) Apical thrombus measuring 11 mm x 8 mm 3) Full thickness scar involving the mid and distal septum and apex Subendocardial scar involving the basal inferior lateral wall    R/LHC 07/05/17  Mid RCA to Dist RCA lesion is 100% stenosed.  Dist Cx lesion is 100% stenosed.  Mid LAD lesion is 100% stenosed.  Prox Cx to Dist Cx lesion is 30% stenosed.   Findings:  Ao = 104/68 (84) LV = 110/9 RA = 1 RV = 19/3 PA = 19/9 (11) PCW = 5 Fick cardiac output/index = 4.3/2.1 PVR = 1.5 WU SVR = 2122 Ao sat = 96% PA sat = 64%, 66%  Assessment: 1. Severe CAD with left-dominant system and occluded mLAD 2. Ischemic CM with EF 25% by echo 3. Well compensated filling pressures with moderately reduced cardiac output  Objective:   Weight Range: 190 lb 4.8 oz (86.3 kg) Body mass index is 28.1 kg/m.   Vital Signs:   Temp:  [97.5 F (36.4 C)-97.8 F (36.6 C)] 97.5 F (36.4 C) (12/11 0559) Pulse Rate:  [69-94] 69 (12/11 0559) Resp:  [14-16] 16 (12/11 0559) BP: (108-122)/(52-70) 108/52 (12/11 0559) SpO2:  [98 %-100 %] 98 % (12/11 0559) Last BM Date: 07/09/17(per pt report)  Weight change: Filed Weights   07/06/17 1716  Weight: 190 lb 4.8 oz (86.3 kg)    Intake/Output:   Intake/Output Summary (Last 24 hours) at 07/10/2017 1011 Last data filed at 07/10/2017 0800 Gross per 24 hour  Intake 600 ml  Output -  Net 600 ml      Physical Exam    General: Well appearing. No resp difficulty. HEENT: Normal Neck: Supple. JVP 5-6. Carotids 2+ bilat; no bruits. No thyromegaly or nodule noted. Cor: PMI nondisplaced. RRR, No M/G/R noted Lungs: CTAB, normal effort. Abdomen: Soft,  non-tender, non-distended, no HSM. No bruits or masses. +BS  Extremities: No cyanosis, clubbing, or rash. R and LLE no edema.  Neuro: Alert & orientedx3, cranial nerves grossly intact.  Affect pleasant Mild word finding difficulties at times. Left sided weakness.     Telemetry   N/A  Labs    CBC Recent Labs    07/09/17 0457 07/10/17 0514  WBC 9.4 10.3  HGB 15.3 15.4  HCT 47.5 48.0  MCV 94.2 94.5  PLT 281 277   Basic Metabolic Panel No results for input(s): NA, K, CL, CO2, GLUCOSE, BUN, CREATININE, CALCIUM, MG, PHOS in the last 72 hours. Liver Function Tests No results for input(s): AST, ALT, ALKPHOS, BILITOT, PROT, ALBUMIN in the last 72 hours. No results for input(s): LIPASE, AMYLASE in the last 72 hours. Cardiac Enzymes No results for input(s): CKTOTAL, CKMB, CKMBINDEX, TROPONINI in the last 72 hours.  BNP: BNP (last 3 results) No results for input(s): BNP in the last 8760 hours.  ProBNP (last 3 results) No results for input(s): PROBNP in the last 8760 hours.   D-Dimer No results for input(s): DDIMER in the last 72 hours. Hemoglobin A1C No results for input(s): HGBA1C in the last 72 hours. Fasting Lipid Panel No results for input(s): CHOL, HDL, LDLCALC, TRIG, CHOLHDL, LDLDIRECT in the last 72 hours. Thyroid Function Tests No results for input(s): TSH,  T4TOTAL, T3FREE, THYROIDAB in the last 72 hours.  Invalid input(s): FREET3  Other results:   Imaging    No results found.   Medications:     Scheduled Medications: . atorvastatin  80 mg Oral q1800  . carvedilol  3.125 mg Oral BID WC  . digoxin  0.125 mg Oral Daily  . fenofibrate  160 mg Oral Daily  . folic acid  1 mg Oral Daily  . losartan  25 mg Oral Daily  . multivitamin with minerals  1 tablet Oral Daily  . nicotine  14 mg Transdermal Daily  . spironolactone  12.5 mg Oral QHS  . thiamine  100 mg Oral Daily  . Warfarin - Pharmacist Dosing Inpatient   Does not apply q1800     Infusions:   PRN Medications: acetaminophen, alum & mag hydroxide-simeth, bisacodyl, diphenhydrAMINE, guaiFENesin-dextromethorphan, polyethylene glycol, prochlorperazine **OR** prochlorperazine **OR** prochlorperazine, sodium phosphate, sorbitol, traZODone    Patient Profile   Angel Davies is a 46 y.o. male with PMH of HTN, ADHD, obesity, anxiety, and depression.   Presented 07/02/17 with CVA. Found to have systolic CHF by echo.  UDS notably + for cocaine.  Assessment/Plan   1. 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 - With EF < 30% and LV thrombus will need AC. - Now on coumadin. INR 2.23. Discussed with Pharm-D.  - Appreciate CIR assistance  2. Acute systolic CHF due to ICM.  - Echo 07/02/17 with LVEF 20-25%, grade 2 DD, Trivial MR, Trivial TR - cMRI EF 18% - SBPs 100-120s. Volume status stable on exam.  - Continue spiro 12.5 mg daily. BMET tomorrow.  - Continue losartan 25 mg daily - Continue digoxin 0.125 mg daily.  - Continue coreg 3.125 mg BID.  BP too low to titrate. Hold meds for BP < 90  - Can eventually [facility] to [facility] as BP tolerates.  - Will need repeat echo 3 months after med optimization for ICD consideration.   3. CAD - LHC 07/05/17 with severe CAD with left-dominant system and occluded mLAD. - Mostly distal CAD. No good targets for CABG or PCI. Continue medical therapy. Based on scar pattern on MRI, We are hopeful EF will improve at least moderately   - Continue ASA and statin   4. Cocaine abuse - Stressed importance of complete cessation. He is motivated to change when we have spoken about this.   5. Alcohol abuse - Stressed importance of complete cessation. He is motivated to change when we have spoken about this.   6. HLD - Continue atorvastatin 80 mg daily.  - Continue  fenofibrate. No change.   Length of Stay: 4  Luane SchoolMichael Andrew  Tillery, PA-C  07/10/2017, 10:11 AM  Advanced Heart Failure Team Pager [phone] (M-F; 7a - 4p)  Please contact [facility] for night-coverage after hours (4p -7a ) and weekends on [email or web address]  Patient seen and examined with the above-signed Advanced Practice Provider and/or Housestaff. I personally reviewed laboratory data, imaging studies and relevant notes. I independently examined the patient and formulated the important aspects of the plan. I have edited the note to reflect any of my changes or salient points. I have personally discussed the plan with the patient and/or family.  Doing well from cardiac standpoint. No anginal sx. Volume status ok. BP too soft to titrate meds further. Will continue to follow as needed. Continue stroke rehab.   Arvilla Meresaniel Aisa Schoeppner, MD  2:03 PM

## 2017-07-10 NOTE — Progress Notes (Signed)
Speech Language Pathology Session Note & Discharge Summary  Patient Details  Name: Angel Davies MRN: [id number] Date of Birth: 1970/11/01  Today's Date: 07/10/2017 SLP Individual Time: 1856-3149 SLP Individual Time Calculation (min): 45 min   Skilled Therapeutic Interventions:   Skilled treatment session focused on speech intelligibility goals. Patient was overall Mod I for use of speech intelligibility strategies at the conversation level to achieve 100% intelligibility with ability to self-monitor and correct errors independently. Patient has met all LTG's at this time, therefore, he will be discharged form skilled SLP intervention. Patient and his wife verbalize understanding and agreement. Patient left upright in wheelchair with wife present. Continue with current plan of care.   Patient has met 1 of 1 long term goals.  Patient to discharge at overall Modified Independent level.   Reasons goals not met: N/A   Clinical Impression/Discharge Summary: Patient has made excellent gains and has met 1 of 1 STG's this reporting period. Currently, patient is overall Mod I for use of speech intelligibility strategies at the conversation level to achieve 100% intelligibility. Patient and family education is completed and patient will be discharged from skilled SLP intervention 2/2 all goals being met with f/u not warranted at this time . Both the patient and his wife verbalize understanding and agreement.   Recommendation:  None      Equipment: N/A    Reasons for discharge: Treatment goals met   Patient/Family Agrees with Progress Made and Goals Achieved: Yes   Function:  Eating Eating   Modified Consistency Diet: No Eating Assist Level: More than reasonable amount of time           Cognition Comprehension Comprehension assist level: Follows complex conversation/direction with no assist  Expression   Expression assist level: Expresses complex ideas: With extra time/assistive  device  Social Interaction Social Interaction assist level: Interacts appropriately with others with medication or extra time (anti-anxiety, antidepressant).  Problem Solving Problem solving assist level: Solves complex problems: Recognizes & self-corrects  Memory Memory assist level: More than reasonable amount of time   Ginamarie Banfield 07/10/2017, 12:29 PM

## 2017-07-10 NOTE — Plan of Care (Signed)
progressing 

## 2017-07-10 NOTE — Progress Notes (Signed)
ANTICOAGULATION CONSULT NOTE - Follow Up Consult  Pharmacy Consult for Coumadin Indication: stroke and LV thrombus  No Known Allergies  Patient Measurements: Height: 5\' 9"  (175.3 cm) Weight: 190 lb 4.8 oz (86.3 kg) IBW/kg (Calculated) : 70.7  Vital Signs: Temp: 97.5 F (36.4 C) (12/11 0559) Temp Source: Oral (12/11 0559) BP: 108/52 (12/11 0559) Pulse Rate: 69 (12/11 0559)  Labs: Recent Labs    07/07/17 1158  07/08/17 0532 07/09/17 0457 07/10/17 0514  HGB  --    < > 15.5 15.3 15.4  HCT  --   --  45.9 47.5 48.0  PLT  --   --  279 281 277  LABPROT  --   --  22.3* 27.2* 24.5*  INR  --   --  1.97 2.55 2.23  HEPARINUNFRC 0.33  --  0.29* <0.10*  --    < > = values in this interval not displayed.    Estimated Creatinine Clearance: 105.7 mL/min (by C-G formula based on SCr of 0.93 mg/dL).  Assessment: 46 y/o M s/p acute embolic stroke, also found to have LV thrombus. s/p heparin-stopped 12/9. Warfarin started 12/7. INR increase from 1.97 to 2.55 yesterday. Today the INR is 2.23, within therapeutic range. Hemoglobin and platelets stable and WNL. No signs or symptoms of bleeding noted at this time. Received 10 mg x 2 days, held 12/9 and 12/10.   Goal of Therapy:  Heparin level 0.3-0.5 units/mL INR goal 2-3 Monitor platelets by anticoagulation protocol: Yes   Plan:  Coumadin 5mg  today x1 Daily INR    Noah Delaineuth Avigail Pilling, RPh Clinical Pharmacist Pager: [phone] 8A-4P #[phone] 4P-10P #[phone] Main Pharmacy [phone]  07/10/2017 8:45 AM

## 2017-07-10 NOTE — Progress Notes (Signed)
Subjective/Complaints:  Now able to make fist on Left   ROS-  No CP, SOB, N/V/D  Objective: Vital Signs: Blood pressure (!) 108/52, pulse 69, temperature (!) 97.5 F (36.4 C), temperature source Oral, resp. rate 16, height 5\' 9"  (1.753 m), weight 86.3 kg (190 lb 4.8 oz), SpO2 98 %. No results found. Results for orders placed or performed during the hospital encounter of 07/06/17 (from the past 72 hour(s))  Heparin level (unfractionated)     Status: None   Collection Time: 07/07/17 11:58 AM  Result Value Ref Range   Heparin Unfractionated 0.33 0.30 - 0.70 IU/mL    Comment:        IF HEPARIN RESULTS ARE BELOW EXPECTED VALUES, AND PATIENT DOSAGE HAS BEEN CONFIRMED, SUGGEST FOLLOW UP TESTING OF ANTITHROMBIN III LEVELS.   Protime-INR     Status: Abnormal   Collection Time: 07/08/17  5:32 AM  Result Value Ref Range   Prothrombin Time 22.3 (H) 11.4 - 15.2 seconds   INR 1.97   CBC     Status: Abnormal   Collection Time: 07/08/17  5:32 AM  Result Value Ref Range   WBC 11.4 (H) 4.0 - 10.5 K/uL   RBC 4.99 4.22 - 5.81 MIL/uL   Hemoglobin 15.5 13.0 - 17.0 g/dL   HCT 16.145.9 09.639.0 - 04.552.0 %   MCV 92.0 78.0 - 100.0 fL   MCH 31.1 26.0 - 34.0 pg   MCHC 33.8 30.0 - 36.0 g/dL   RDW 40.913.7 81.111.5 - 91.415.5 %   Platelets 279 150 - 400 K/uL  Heparin level (unfractionated)     Status: Abnormal   Collection Time: 07/08/17  5:32 AM  Result Value Ref Range   Heparin Unfractionated 0.29 (L) 0.30 - 0.70 IU/mL    Comment:        IF HEPARIN RESULTS ARE BELOW EXPECTED VALUES, AND PATIENT DOSAGE HAS BEEN CONFIRMED, SUGGEST FOLLOW UP TESTING OF ANTITHROMBIN III LEVELS.   CBC     Status: None   Collection Time: 07/09/17  4:57 AM  Result Value Ref Range   WBC 9.4 4.0 - 10.5 K/uL   RBC 5.04 4.22 - 5.81 MIL/uL   Hemoglobin 15.3 13.0 - 17.0 g/dL   HCT 78.247.5 95.639.0 - 21.352.0 %   MCV 94.2 78.0 - 100.0 fL   MCH 30.4 26.0 - 34.0 pg   MCHC 32.2 30.0 - 36.0 g/dL   RDW 08.614.2 57.811.5 - 46.915.5 %   Platelets 281 150 - 400  K/uL  Heparin level (unfractionated)     Status: Abnormal   Collection Time: 07/09/17  4:57 AM  Result Value Ref Range   Heparin Unfractionated <0.10 (L) 0.30 - 0.70 IU/mL    Comment:        IF HEPARIN RESULTS ARE BELOW EXPECTED VALUES, AND PATIENT DOSAGE HAS BEEN CONFIRMED, SUGGEST FOLLOW UP TESTING OF ANTITHROMBIN III LEVELS.   Protime-INR     Status: Abnormal   Collection Time: 07/09/17  4:57 AM  Result Value Ref Range   Prothrombin Time 27.2 (H) 11.4 - 15.2 seconds   INR 2.55   Protime-INR     Status: Abnormal   Collection Time: 07/10/17  5:14 AM  Result Value Ref Range   Prothrombin Time 24.5 (H) 11.4 - 15.2 seconds   INR 2.23   CBC     Status: None   Collection Time: 07/10/17  5:14 AM  Result Value Ref Range   WBC 10.3 4.0 - 10.5 K/uL   RBC 5.08 4.22 -  5.81 MIL/uL   Hemoglobin 15.4 13.0 - 17.0 g/dL   HCT 10.248.0 72.539.0 - 36.652.0 %   MCV 94.5 78.0 - 100.0 fL   MCH 30.3 26.0 - 34.0 pg   MCHC 32.1 30.0 - 36.0 g/dL   RDW 44.014.0 34.711.5 - 42.515.5 %   Platelets 277 150 - 400 K/uL     HEENT: normal Cardio: RRR and no murmur Resp: CTA B/L and unlabored GI: BS positive and NT, ND Extremity:  Pulses positive and No Edema Skin:   Intact and Other IV site ok Neuro: Alert/Oriented, Normal Sensory, Abnormal Motor 3 left delt bi tri FF, 3+ L HF, KE 0 ADF and Abnormal FMC, no visual field def Ataxic/ dec FMC Musc/Skel:  Normal Gen NAD   Assessment/Plan: 1. 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 Physiatrist is providing close team supervision and 24 hour management of active medical problems listed below. Physiatrist and rehab team continue to assess barriers to discharge/monitor patient progress toward functional and medical goals. FIM: Function - Bathing Bathing activity did not occur: Refused  Function- Upper Body Dressing/Undressing What is the  patient wearing?: Pull over shirt/dress Pull over shirt/dress - Perfomed by patient: Thread/unthread right sleeve, Put head through opening Pull over shirt/dress - Perfomed by helper: Thread/unthread left sleeve Assist Level: Touching or steadying assistance(Pt > 75%) Function - Lower Body Dressing/Undressing What is the patient wearing?: Non-skid slipper socks, Pants Pants- Performed by patient: Thread/unthread right pants leg, Pull pants up/down Pants- Performed by helper: Thread/unthread left pants leg Non-skid slipper socks- Performed by helper: Don/doff right sock, Don/doff left sock Assist for lower body dressing: Touching or steadying assistance (Pt > 75%)  Function - Toileting Toileting steps completed by patient: Performs perineal hygiene Toileting steps completed by helper: Adjust clothing prior to toileting, Performs perineal hygiene, Adjust clothing after toileting Toileting Assistive Devices: Grab bar or rail Assist level: Touching or steadying assistance (Pt.75%)  Function - ArchivistToilet Transfers Toilet transfer assistive device: Grab bar Assist level to toilet: Moderate assist (Pt 50 - 74%/lift or lower) Assist level from toilet: Moderate assist (Pt 50 - 74%/lift or lower)  Function - Chair/bed transfer Chair/bed transfer method: Stand pivot Chair/bed transfer assist level: Touching or steadying assistance (Pt > 75%) Chair/bed transfer assistive device: Armrests  Function - Locomotion: Wheelchair Will patient use wheelchair at discharge?: Yes Type: Manual Max wheelchair distance: 11500ft Assist Level: Touching or steadying assistance (Pt > 75%) Assist Level: Touching or steadying assistance (Pt > 75%) Wheel 150 feet activity did not occur: Safety/medical concerns Function - Locomotion: Ambulation Assistive device: Walker-rolling, Orthosis(hand splint) Max distance: 100 Assist level: Moderate assist (Pt 50 - 74%) Assist level: Moderate assist (Pt 50 - 74%) Walk 50 feet  with 2 turns activity did not occur: Safety/medical concerns Assist level: Moderate assist (Pt 50 - 74%) Walk 150 feet activity did not occur: Safety/medical concerns Walk 10 feet on uneven surfaces activity did not occur: Safety/medical concerns  Function - Comprehension Comprehension: Auditory Comprehension assist level: Follows complex conversation/direction with no assist  Function - Expression Expression: Verbal Expression assist level: Expresses basic 90% of the time/requires cueing < 10% of the time.  Function - Social Interaction Social Interaction assist level: Interacts appropriately 90% of the time - Needs monitoring or encouragement for participation or interaction.  Function - Problem Solving Problem solving assist level: Solves basic 90% of the time/requires cueing < 10%  of the time  Function - Memory Memory assist level: Recognizes or recalls 90% of the time/requires cueing < 10% of the time Patient normally able to recall (first 3 days only): Current season, Location of own room, Staff names and faces, That he or she is in a hospital  Medical Problem List and Plan:  1. Functional deficits and left hemiparesis secondary to acute right pontine infarct/subacute right temporal/occipital infarcts  CIR PT, OT, SLP - Cont CIR, team conf in am 2. DVT Prophylaxis/Anticoagulation: Pharmaceutical: INR therapeutic, off heparin 3. Pain Management: tylenol prn  4. Mood: LCSW to follow for evaluation and support.  Will ask neuropsychology to-follow-up with the patient as he is expressing signs and symptoms of depression  5. Neuropsych: This patient is capable of making decisions on his own behalf.  6. Skin/Wound Care: Routine pressure relief measures.  7. Fluids/Electrolytes/Nutrition: Monitor I/O. Lytes normal 12/8  8. Severe CAD with CM: Treated medically with ASA, statin , no angina 9. Acute systolic CHF: Was started spironolactone and digoxin on 12/7. BP soft , Cardiology to  follow, avoid hypotension/hypoperfusion. Vitals:   07/09/17 1610 07/10/17 0559  BP: 122/70 (!) 108/52  Pulse: 94 69  Resp: 14 16  Temp: 97.8 F (36.6 C) (!) 97.5 F (36.4 C)  SpO2: 100% 98%   10.  Apical thrombus cont warfarin, INR 2.23 11. Prediabetes: Hgb A1C- 6.0. Will have dietitian educate patient on CM diet--monitor BS for trends.   12. Polysubstance abuse: Has been counseled on importance of alcohol, cocaine and THC cessation    LOS (Days) 4 A FACE TO FACE EVALUATION WAS PERFORMED  Erick Colace 07/10/2017, 7:21 AM

## 2017-07-10 NOTE — Progress Notes (Signed)
Physical Therapy Session Note  Patient Details  Name: Angel Davies MRN: [id number] Date of Birth: 06/01/1971  Today's Date: 07/10/2017 PT Individual Time: 1415-1530 PT Individual Time Calculation (min): 75 min   Short Term Goals: Week 1:  PT Short Term Goal 1 (Week 1): Pt will perform bed mobility with supervision assist PT Short Term Goal 2 (Week 1): Pt will perform trasnfers with supervision assist  PT Short Term Goal 3 (Week 1): Pt will ambulate 100ft with min assist  PT Short Term Goal 4 (Week 1): Pt will propell WC 100ft with supervision assist.   Skilled Therapeutic Interventions/Progress Updates:    no c/o pain.  Session focus on NMR for LLE, postural control, and activity tolerance.    Gait training x100' with RW and mod assist with cues for pacing and postural control.  PT fitted pt with GRAFO to LLE and pt ambulated x100' with RW and min/mod assist with improved L knee control.  NMR in standing with minisquats 2x10 and L hip flexion focus on returning to target 2x10.  Pt completes transfer to floor with steady assist.  NMR in tall kneeling with alternating forward/backward RLE and LLE 3 trials to fatigue.  NMR in half kneeling with RUE over LUE for towel pushes focus on return to upright.  Pt returned to seated on mat with min assist.  Nustep x8 minutes at level 4 focus on reciprocal stepping pattern retraining, maintaining neutral LLE alignment, and activity tolerance.  Pt returned to room at end of session and positioned with call bell in reach and needs met.   Therapy Documentation Precautions:  Precautions Precautions: Fall Restrictions Weight Bearing Restrictions: No   See Function Navigator for Current Functional Status.   Therapy/Group: Individual Therapy   E  07/10/2017, 4:07 PM  

## 2017-07-11 ENCOUNTER — Inpatient Hospital Stay (HOSPITAL_COMMUNITY): Payer: Managed Care, Other (non HMO) | Admitting: Physical Therapy

## 2017-07-11 ENCOUNTER — Telehealth: Payer: Self-pay | Admitting: Neurology

## 2017-07-11 ENCOUNTER — Inpatient Hospital Stay (HOSPITAL_COMMUNITY): Payer: Managed Care, Other (non HMO) | Admitting: Occupational Therapy

## 2017-07-11 ENCOUNTER — Encounter (HOSPITAL_COMMUNITY): Payer: Managed Care, Other (non HMO) | Admitting: Psychology

## 2017-07-11 LAB — BASIC METABOLIC PANEL
Anion gap: 9 (ref 5–15)
BUN: 14 mg/dL (ref 6–20)
CO2: 24 mmol/L (ref 22–32)
CREATININE: 1.01 mg/dL (ref 0.61–1.24)
Calcium: 9.2 mg/dL (ref 8.9–10.3)
Chloride: 103 mmol/L (ref 101–111)
GFR calc Af Amer: >60 mL/min (ref >60)
GFR calc non Af Amer: >60 mL/min (ref >60)
Glucose, Bld: 116 mg/dL — ABNORMAL HIGH (ref 65–99)
POTASSIUM: 3.9 mmol/L (ref 3.5–5.1)
SODIUM: 136 mmol/L (ref 135–145)

## 2017-07-11 LAB — PROTIME-INR
INR: 2.81
PROTHROMBIN TIME: 29.3 s — AB (ref 11.4–15.2)

## 2017-07-11 MED ORDER — WARFARIN SODIUM 2.5 MG PO TABS
2.5000 mg | ORAL_TABLET | Freq: Once | ORAL | Status: AC
  Administered 2017-07-11: 2.5 mg via ORAL
  Filled 2017-07-11: qty 1

## 2017-07-11 NOTE — Telephone Encounter (Signed)
Kathie RhodesBetty or Dianne:  Dr. Roda ShuttersXu requested follow up with me as an inpatient follow up but this patient can be seen by Latrelle Dodrillaroline Martin, please place patient on her schedule first available it is fine even in Feb or March. thanks thanks.

## 2017-07-11 NOTE — Progress Notes (Signed)
Occupational Therapy Session Note  Patient Details  Name: Angel Davies MRN: [id number] Date of Birth: 10-Sep-1970  Today's Date: 07/11/2017 OT Individual Time: 6886-4847 OT Individual Time Calculation (min): 60 min   Short Term Goals: Week 1:  OT Short Term Goal 1 (Week 1): Pt will stand at sink to groom 2/2 items wiht supervision OT Short Term Goal 2 (Week 1): Pt will don shirt with supervision and no more than 1 cue for hemi techniques OT Short Term Goal 3 (Week 1): Pt will fasten pants to improve [place] wiht up to min A OT Short Term Goal 4 (Week 1): Pt will transfer to toilet/BSC with supervision and LRAD OT Short Term Goal 5 (Week 1): Pt will bathe 50% of body with LUE and AE PRN  Skilled Therapeutic Interventions/Progress Updates:    OT treatment session focused on L NMR. Pt propelled wc to therapy gym and completed stand-pivot transfer with min A. Worked on L NMR with graded towel pushes up medium wedge. Grip and pinch strength exercises using medium red thera-putty and medium pink foam. Progressed to grasp/release of cups with focus on normal movement patterns and decreased exaggerated shoulder ROM. 1:1 NMES applied to wrist extensors for distal ROM.   Ratio 1:1 Rate 35 pps Waveform- Asymmetric Ramp 1.0 Pulse 300 Intensity- 25 Duration - 15  Report of pain at the beginning of session 0 Report of pain at the end of session 0  No adverse reactions after treatment and is skin intact.   Pt returned to room and was set-up for lunch with assistance to remove lids. Pt left with needs met.    Therapy Documentation Precautions:  Precautions Precautions: Fall Restrictions Weight Bearing Restrictions: No Pain:   none/denies pain  See Function Navigator for Current Functional Status.   Therapy/Group: Individual Therapy  [name] 07/11/2017, 12:33 PM

## 2017-07-11 NOTE — Progress Notes (Signed)
ANTICOAGULATION CONSULT NOTE - Follow Up Consult  Pharmacy Consult for warfarin Indication: stroke and LV thrombus  No Known Allergies  Patient Measurements: Height: 5\' 9"  (175.3 cm) Weight: 190 lb 4.8 oz (86.3 kg) IBW/kg (Calculated) : 70.7  Vital Signs: Temp: 97.9 F (36.6 C) (12/12 0526) Temp Source: Oral (12/12 0526) BP: 108/66 (12/12 0843) Pulse Rate: 81 (12/12 0843)  Labs: Recent Labs    07/09/17 0457 07/10/17 0514 07/11/17 0514  HGB 15.3 15.4  --   HCT 47.5 48.0  --   PLT 281 277  --   LABPROT 27.2* 24.5* 29.3*  INR 2.55 2.23 2.81  HEPARINUNFRC <0.10*  --   --   CREATININE  --   --  1.01    Estimated Creatinine Clearance: 97.3 mL/min (by C-G formula based on SCr of 1.01 mg/dL).  Assessment: 46 y/o M s/p acute embolic stroke, also found to have LV thrombus. s/p heparin-stopped 12/9. Warfarin started 12/7. INR increase from 1.97 to 2.55 yesterday. Today the INR is 2.81, a significant bump but within therapeutic range. Hemoglobin and platelets stable and WNL. No signs or symptoms of bleeding noted at this time. Warfarin was held 12/9 and 12/10 d/t rapidly increasing INR.   Goal of Therapy:  INR goal 2-3 Monitor platelets by anticoagulation protocol: Yes   Plan:  Give warfarin 2.5 mg po x 1 Monitor daily INR, CBC, clinical course, s/sx of bleed, PO intake, DDI   Thank you for allowing us to participate in this patients care.  Signe Coltonya C Janiesha Diehl, PharmD Clinical phone for 07/11/2017 from 7a-3:30p: [phone] If after 3:30p, please call main pharmacy at: [phone] 07/11/2017 12:22 PM

## 2017-07-11 NOTE — Consult Note (Signed)
Neuropsychological Consultation   Patient:   Angel Davies   DOB:   02-12-1971  MR Number:  [id number]  Location:  [facility] [address] [facility] [id number] [place] [address] Dept: [phone] Loc: [phone]           Date of Service:   07/11/2017  Start Time:   3:30 PM End Time:   5 PM  Provider/Observer:  Arley Phenix, Psy.D.       Clinical Neuropsychologist       Billing Code/Service: [phone] Units  Chief Complaint:    Angel Davies is a 46 year old male with a history of ADHD, anxiety, depression and anxiety.  He had been treated ongoing for his attentional issues with Adderall and anxiety issues with benzodiazepines.  The patient is reported to have had a fall in June 2008 with loss of peripheral vision and onset of headache.  There is a history of polysubstance abuse related to alcohol, cocaine, and marijuana.  However, the patient reports that the cocaine and marijuana use was very minimal and that he consumed approximately 2 bottles of wine over a week.  Patient was admitted on 07/02/2017 after a fall.  He presented with slurred speech and left-sided weakness.  He acknowledges drinking alcohol and use of cocaine the night before.  Urine drug screen was positive for cocaine and THC.  Head CT was negative.  MRI/MRA of the brain revealed acute right pontine infarct, subacute small right temporal occipital lobe/PCA territory infarct and severe stenosis distal right P2 segment.  There was indications of reduced cardiovascular functioning and the patient underwent cardiac catheterization on 07/05/2017.  This revealed severe CAD with occluded mid LAD as well as compensated filling pressure with moderately reduced cardiac output.  MRI of the heart revealed severe left ventricle dysfunction.  Reason for Service:  Angel Davies was referred for neuropsychological/psychological consultation due to adjustment  issues and concerns about prior issues with anxiety.  Below is the HPI for the current admission.     HPI: Angel Davies is a 46 y.o. male with history of ADHD, anxiety, depression/anxiety, fall 12/2016 with loss of peripheral vision and onset of HA, polysubtance abuse who was admitted on 07/02/17 with fall, slurred speech and left sided weakness. Patient reported that he was drinking ETOH and used cocaine the night before--UDS positive for cocaine and THC. CT head negative. MRI/MRA brain done revealing " Acute RIGHT pontine infarct, Subacute small RIGHT temporal occipital lobe/PCA territory infarct and severe stenosis distal rigth P2 segment. CTA head/neck was negative for emergent vascular finding, 40% left SA stenosis due to plaque and emphysema. 2D echo showed EF 20-25% with moderately dilated LV, akinesis of anterior and apical myocardium with grade 2 diastolic dysfunction.  He was evaluated by Dr. Gala Romney and underwent cardiac cath on 12/6 revealing severe CAD with occluded mid LAD and well compensated filling pressures with moderately reduced cardiac output. Cardiac MRI ordered today for work up of decrease in EF out of proportion to CAD.  MRI reveals severe left ventricular dysfunction with ejection fraction of 18% and septal/apical akinesis with diffuse hypokinesis.  Also notable is an apical thrombus measuring 11 mm x 8 mm and a full-thickness scar involving the left mid and distal septum and apex.  Additionally a subendocardial scar involving the basal inferior lateral wall was seen.  Patient is to be treated medically with repeat echo in 3 months. He continues on heparin/coumadin bridge.  Current Status:  The patient reports that he has been emotional about the entire medical situation.  He realizes the significance of his cardiovascular situation and the nature of his pontine stroke and other effects of his CVA.  The patient does acknowledge his substance use but does try to intellectualize that  the cocaine was therapeutic for his attention and concentration issues even with it being pointed out that he had already been prescribed Adderall.  The patient reports that he is dedicated to staying away from substance abuse and realizes the prior risk were underappreciated and acknowledges the significant risk for future use.  Behavioral Observation: Angel Davies  presents as a 46 y.o.-year-old Right Caucasian Male who appeared his stated age. his dress was Appropriate and he was Well Groomed and his manners were Appropriate to the situation.  his participation was indicative of Appropriate and Attentive behaviors.  There were physical disabilities noted.  he displayed an appropriate level of cooperation and motivation.     Interactions:    Active Appropriate and Attentive  Attention:   within normal limits and attention span and concentration were age appropriate  Memory:   within normal limits; recent and remote memory intact  Visuo-spatial:  not examined  Speech (Volume):  normal  Speech:   normal;   Thought Process:  Coherent and Relevant  Though Content:  WNL; not suicidal  Orientation:   person, place, time/date and situation  Judgment:   Fair  Planning:   Fair  Affect:    Anxious  Mood:    Dysphoric  Insight:   Fair  Intelligence:   high  Substance Use:  There is a documented history of alcohol, cocaine and marijuana abuse confirmed by the patient.    Education:   Control and instrumentation engineerCollege  Medical History:   Past Medical History:  Diagnosis Date  . ADHD (attention deficit hyperactivity disorder)   . Anxiety   . CVA (cerebral vascular accident) (HCC) 07/02/2017   "left sided weakness" (07/02/2017)  . Depression   . History of kidney stones         Psychiatric History:  Patient has a prior history with treatment for diagnoses of ADHD as well as anxiety disorder.  The patient also has a history of depressive events.  Family Med/Psych History:  Family History  Problem  Relation Age of Onset  . Stroke Mother   . Colon cancer Mother   . Prostate cancer Father   . Seizures Neg Hx     Risk of Suicide/Violence: low patient denies any suicidal or homicidal ideation.  Impression/DX:  Angel Davies is a 46 year old male with a history of ADHD, anxiety, depression and anxiety.  He had been treated ongoing for his attentional issues with Adderall and anxiety issues with benzodiazepines.  The patient is reported to have had a fall in June 2008 with loss of peripheral vision and onset of headache.  There is a history of polysubstance abuse related to alcohol, cocaine, and marijuana.  However, the patient reports that the cocaine and marijuana use was very minimal and that he consumed approximately 2 bottles of wine over a week.  Patient was admitted on 07/02/2017 after a fall.  He presented with slurred speech and left-sided weakness.  He acknowledges drinking alcohol and use of cocaine the night before.  Urine drug screen was positive for cocaine and THC.  Head CT was negative.  MRI/MRA of the brain revealed acute right pontine infarct, subacute small right temporal occipital lobe/PCA territory infarct  and severe stenosis distal right P2 segment.  There was indications of reduced cardiovascular functioning and the patient underwent cardiac catheterization on 07/05/2017.  This revealed severe CAD with occluded mid LAD as well as compensated filling pressure with moderately reduced cardiac output.  MRI of the heart revealed severe left ventricle dysfunction.  The patient reports that he has been emotional about the entire medical situation.  He realizes the significance of his cardiovascular situation and the nature of his pontine stroke and other effects of his CVA.  The patient does acknowledge his substance use but does try to intellectualize that the cocaine was therapeutic for his attention and concentration issues even with it being pointed out that he had already been  prescribed Adderall.  The patient reports that he is dedicated to staying away from substance abuse and realizes the prior risk were underappreciated and acknowledges the significant risk for future use.  Disposition/Plan:  I will see the patient again the first of next week.  The patient is requested that we continue to address the topics that were discussed today regarding his ongoing treatment for his underlying anxiety and depression and attentional issues and how he will cope with any residual effects from his stroke and significant cardiovascular issues.        Electronically Signed   _______________________ Arley PhenixJohn Carmencita Cusic, Psy.D.

## 2017-07-11 NOTE — Progress Notes (Signed)
Physical Therapy Session Note  Patient Details  Name: Angel Davies MRN: [id number] Date of Birth: 08/12/1970  Today's Date: 07/11/2017 PT Individual Time: 1000-1100 PT Individual Time Calculation (min): 60 min   Short Term Goals: Week 1:  PT Short Term Goal 1 (Week 1): Pt will perform bed mobility with supervision assist PT Short Term Goal 2 (Week 1): Pt will perform trasnfers with supervision assist  PT Short Term Goal 3 (Week 1): Pt will ambulate 16200ft with min assist  PT Short Term Goal 4 (Week 1): Pt will propell WC 11000ft with supervision assist.   Skilled Therapeutic Interventions/Progress Updates:    Pt seated in w/c, agreeable and motivated to participate in therapy session. Pt reports no pain this AM. Ambulation 2 x 150 ft with RW and hand splint, L AFO, Mod A, v/c for safety. Pt exhibits ataxic gait pattern and decreased L LE clearance. Pt requires v/c for safety with turns and to slow down, improves performance and safety with cueing. Standing BLE coordination and balance activity: alt L/R toe taps on bean bags with BUE support, Min A, v/c for L/R and bean bag color. Nustep level 4 x 10 min with B UE/LE for strengthening and reciprocal movements, hand grip assist for L UE and L LE straps, manual assist from therapist to decrease L hip external rotation. Pt left seated in bed with needs in reach at end of therapy session.  Therapy Documentation Precautions:  Precautions Precautions: Fall Restrictions Weight Bearing Restrictions: No   See Function Navigator for Current Functional Status.   Therapy/Group: Individual Therapy   Peter Congoaylor Harish Bram, PT, DPT  07/11/2017, 12:06 PM

## 2017-07-11 NOTE — Progress Notes (Signed)
Physical Therapy Session Note  Patient Details  Name: Angel Davies MRN: [id number] Date of Birth: 06-Nov-1970  Today's Date: 07/11/2017 PT Individual Time: 1300-1415 PT Individual Time Calculation (min): 75 min   Short Term Goals: Week 1:  PT Short Term Goal 1 (Week 1): Pt will perform bed mobility with supervision assist PT Short Term Goal 2 (Week 1): Pt will perform trasnfers with supervision assist  PT Short Term Goal 3 (Week 1): Pt will ambulate 168f with min assist  PT Short Term Goal 4 (Week 1): Pt will propell WC 1058fwith supervision assist.   Skilled Therapeutic Interventions/Progress Updates:   Pt received supine in bed and agreeable to PT. Supine>sit transfer with supervision assist. PT assisted pt to don shoes EOB for time management. Stand [facility]. PT [facility] instructed by PT with orthotist present to assess proper fit of AFO. 4034f 4 with various AFOs; PLS noted to have decreased knee control, anterior support with medial upright noted not to fit in   Shoe due to semi-ridged sole insert. Improved knee control, heel strike, and foot clearance with anterior support [place] AFO. Additional gait training x 200f9fth min assist in AFO and RW.  Standing balance with lateral reach to the L to force WB through the LLE as well as improve Neuromotor control in the LUE for gross and fine motor tasks to grab card off board as well as place and retieve horse shoes on basket ball goal.  Standing tolerance and gross motor task to play checkers at hight low table x 8 minutes.   Patient returned to room and left sitting [facility] in reach and all needs met.        Therapy Documentation Precautions:  Precautions Precautions: Fall Restrictions Weight Bearing Restrictions: No  Vital Signs: Therapy Vitals Temp: 98.1 F (36.7 C) Temp Source: Oral Pulse Rate: 89 Resp: 18 BP: 107/69 Patient Position  (if appropriate): Sitting Oxygen Therapy SpO2: 96 % O2 Device: Not Delivered Pain: 0/10  Other Treatments:     See Function Navigator for Current Functional Status.   Therapy/Group: Individual Therapy  AustLorie Phenix12/2018, 2:59 PM

## 2017-07-11 NOTE — Progress Notes (Signed)
Subjective/Complaints: Pt feels like he has more energy, asking if his heart is working better we discussed that this also may be due to no ETOH, THC, cocaine abuse  ROS-  No CP, SOB, N/V/D  Objective: Vital Signs: Blood pressure (!) 102/41, pulse 77, temperature 97.9 F (36.6 C), temperature source Oral, resp. rate 16, height _0  (1.753 m), weight 86.3 kg (190 lb 4.8 oz), SpO2 96 %. No results found. Results for orders placed or performed during the hospital encounter of 07/06/17 (from the past 72 hour(s))  CBC     Status: None   Collection Time: 07/09/17  4:57 AM  Result Value Ref Range   WBC 9.4 4.0 - 10.5 K/uL   RBC 5.04 4.22 - 5.81 MIL/uL   Hemoglobin 15.3 13.0 - 17.0 g/dL   HCT 47.5 39.0 - 52.0 %   MCV 94.2 78.0 - 100.0 fL   MCH 30.4 26.0 - 34.0 pg   MCHC 32.2 30.0 - 36.0 g/dL   RDW 14.2 11.5 - 15.5 %   Platelets 281 150 - 400 K/uL  Heparin level (unfractionated)     Status: Abnormal   Collection Time: 07/09/17  4:57 AM  Result Value Ref Range   Heparin Unfractionated <0.10 (L) 0.30 - 0.70 IU/mL    Comment:        IF HEPARIN RESULTS ARE BELOW EXPECTED VALUES, AND PATIENT DOSAGE HAS BEEN CONFIRMED, SUGGEST FOLLOW UP TESTING OF ANTITHROMBIN III LEVELS.   Protime-INR     Status: Abnormal   Collection Time: 07/09/17  4:57 AM  Result Value Ref Range   Prothrombin Time 27.2 (H) 11.4 - 15.2 seconds   INR 2.55   Protime-INR     Status: Abnormal   Collection Time: 07/10/17  5:14 AM  Result Value Ref Range   Prothrombin Time 24.5 (H) 11.4 - 15.2 seconds   INR 2.23   CBC     Status: None   Collection Time: 07/10/17  5:14 AM  Result Value Ref Range   WBC 10.3 4.0 - 10.5 K/uL   RBC 5.08 4.22 - 5.81 MIL/uL   Hemoglobin 15.4 13.0 - 17.0 g/dL   HCT 48.0 39.0 - 52.0 %   MCV 94.5 78.0 - 100.0 fL   MCH 30.3 26.0 - 34.0 pg   MCHC 32.1 30.0 - 36.0 g/dL   RDW 14.0 11.5 - 15.5 %   Platelets 277 150 - 400 K/uL  Protime-INR     Status: Abnormal   Collection Time: 07/11/17   5:14 AM  Result Value Ref Range   Prothrombin Time 29.3 (H) 11.4 - 15.2 seconds   INR 5.99   Basic metabolic panel     Status: Abnormal   Collection Time: 07/11/17  5:14 AM  Result Value Ref Range   Sodium 136 135 - 145 mmol/L   Potassium 3.9 3.5 - 5.1 mmol/L   Chloride 103 101 - 111 mmol/L   CO2 24 22 - 32 mmol/L   Glucose, Bld 116 (H) 65 - 99 mg/dL   BUN 14 6 - 20 mg/dL   Creatinine, Ser 1.01 0.61 - 1.24 mg/dL   Calcium 9.2 8.9 - 10.3 mg/dL   GFR calc non Af Amer >60 >60 mL/min   GFR calc Af Amer >60 >60 mL/min    Comment: (NOTE) The eGFR has been calculated using the CKD EPI equation. This calculation has not been validated in all clinical situations. eGFR's persistently <60 mL/min signify possible Chronic Kidney Disease.    Anion  gap 9 5 - 15     HEENT: normal Cardio: RRR and no murmur Resp: CTA B/L and unlabored GI: BS positive and NT, ND Extremity:  Pulses positive and No Edema Skin:   Intact and Other IV site ok Neuro: Alert/Oriented, Normal Sensory, Abnormal Motor 3 left delt bi tri FF, 3+ L HF, KE 0 ADF and Abnormal FMC, no visual field def Ataxic/ dec FMC Musc/Skel:  Normal Gen NAD   Assessment/Plan: 1. 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 Physiatrist is providing close team supervision and 24 hour management of active medical problems listed below. Physiatrist and rehab team continue to assess barriers to discharge/monitor patient progress toward functional and medical goals. FIM: Function - Bathing Bathing activity did not occur: Refused Position: Shower Body parts bathed by patient: Left arm, Chest, Abdomen, Front perineal area, Buttocks, Left upper leg, Right upper leg Body parts bathed by helper: Left lower leg, Right lower leg, Right arm, Back Assist Level: Touching or steadying assistance(Pt > 75%)  Function- Upper  Body Dressing/Undressing What is the patient wearing?: Pull over shirt/dress Pull over shirt/dress - Perfomed by patient: Thread/unthread right sleeve, Put head through opening Pull over shirt/dress - Perfomed by helper: Thread/unthread left sleeve Assist Level: Touching or steadying assistance(Pt > 75%) Function - Lower Body Dressing/Undressing What is the patient wearing?: Socks, Shoes, Underwear, Pants Position: Wheelchair/chair at [employer] - Performed by patient: Thread/unthread right underwear leg Underwear - Performed by helper: Thread/unthread left underwear leg, Pull underwear up/down Pants- Performed by patient: Thread/unthread right pants leg Pants- Performed by helper: Pull pants up/down, Thread/unthread left pants leg Non-skid slipper socks- Performed by helper: Don/doff right sock, Don/doff left sock Socks - Performed by helper: Don/doff right sock, Don/doff left sock Shoes - Performed by helper: Don/doff right shoe, Don/doff left shoe, Fasten right, Fasten left Assist for footwear: Maximal assist Assist for lower body dressing: Touching or steadying assistance (Pt > 75%)  Function - Toileting Toileting steps completed by patient: Performs perineal hygiene Toileting steps completed by helper: Adjust clothing prior to toileting, Performs perineal hygiene, Adjust clothing after toileting Toileting Assistive Devices: Grab bar or rail Assist level: Touching or steadying assistance (Pt.75%)  Function - Air cabin crew transfer activity did not occur: N/A Toilet transfer assistive device: Grab bar Assist level to toilet: Moderate assist (Pt 50 - 74%/lift or lower) Assist level from toilet: Moderate assist (Pt 50 - 74%/lift or lower)  Function - Chair/bed transfer Chair/bed transfer activity did not occur: N/A Chair/bed transfer method: Stand pivot Chair/bed transfer assist level: Touching or steadying assistance (Pt > 75%) Chair/bed transfer assistive device:  Walker, Armrests  Function - Locomotion: Wheelchair Will patient use wheelchair at discharge?: Yes Type: Manual Max wheelchair distance: 112f Assist Level: Touching or steadying assistance (Pt > 75%) Assist Level: Touching or steadying assistance (Pt > 75%) Wheel 150 feet activity did not occur: Safety/medical concerns Function - Locomotion: Ambulation Assistive device: Walker-rolling, Orthosis Max distance: 100 Assist level: Moderate assist (Pt 50 - 74%) Assist level: Moderate assist (Pt 50 - 74%) Walk 50 feet with 2 turns activity did not occur: Safety/medical concerns Assist level: Moderate assist (Pt 50 - 74%) Walk 150 feet activity did not occur: Safety/medical concerns Walk 10 feet on uneven surfaces activity did not occur: Safety/medical concerns  Function - Comprehension Comprehension: Auditory Comprehension assist level: Follows complex conversation/direction with no assist  Function -  Expression Expression: Verbal Expression assist level: Expresses complex ideas: With extra time/assistive device  Function - Social Interaction Social Interaction assist level: Interacts appropriately with others with medication or extra time (anti-anxiety, antidepressant).  Function - Problem Solving Problem solving assist level: Solves complex problems: Recognizes & self-corrects  Function - Memory Memory assist level: More than reasonable amount of time Patient normally able to recall (first 3 days only): Current season, Location of own room, Staff names and faces, That he or she is in a hospital  Medical Problem List and Plan:  1. Functional deficits and left hemiparesis secondary to acute right pontine infarct/subacute right temporal/occipital infarcts  CIR PT, OT, SLP - Cont CIR, Team conference today please see physician documentation under team conference tab, met with team face-to-face to discuss problems,progress, and goals. Formulized individual treatment plan based on  medical history, underlying problem and comorbidities. 2. DVT Prophylaxis/Anticoagulation: Pharmaceutical: INR therapeutic, off heparin 3. Pain Management: tylenol prn  4. Mood: LCSW to follow for evaluation and support.  Will ask neuropsychology to-follow-up with the patient as he is expressing signs and symptoms of depression  5. Neuropsych: This patient is capable of making decisions on his own behalf.  6. Skin/Wound Care: Routine pressure relief measures.  7. Fluids/Electrolytes/Nutrition: Monitor I/O. Lytes normal 12/8  8. Severe CAD with CM: Treated medically with ASA, statin , no angina 9. Acute systolic CHF: Was started spironolactone and digoxin on 12/7. BP soft , Cardiology to follow, avoid hypotension/hypoperfusion. Vitals:   07/10/17 2112 07/11/17 0526  BP: (!) 108/50 (!) 102/41  Pulse: 65 77  Resp: 18 16  Temp: 98.1 F (36.7 C) 97.9 F (36.6 C)  SpO2: 98% 96%   10.  Apical thrombus cont warfarin, INR 2.23 as per pharm protocol 11. Prediabetes: Hgb A1C- 6.0. Will have dietitian educate patient on CM diet--monitor BS for trends.   12. Polysubstance abuse: Has been counseled on importance of alcohol, cocaine and THC cessation-ask neuropsych to eval    LOS (Days) 5 A FACE TO FACE EVALUATION WAS PERFORMED  Charlett Blake 07/11/2017, 8:14 AM

## 2017-07-11 NOTE — Progress Notes (Signed)
Called in hanger brace order; spoke with JasmineOrthopedic Tech Progress Note Patient Details:  Angel Davies [place] 09/23/1970 [id number]  Patient ID: Angel Davies Angel Davies, male   DOB: 11/25/1970, 46 y.o.   MRN: [id number]   Nikki DomCrawford, Maeci Kalbfleisch 07/11/2017, 12:22 PM Called in hanger brace order; spoke with [facility]

## 2017-07-12 ENCOUNTER — Inpatient Hospital Stay (HOSPITAL_COMMUNITY): Payer: Managed Care, Other (non HMO) | Admitting: Occupational Therapy

## 2017-07-12 ENCOUNTER — Inpatient Hospital Stay (HOSPITAL_COMMUNITY): Payer: Managed Care, Other (non HMO) | Admitting: Physical Therapy

## 2017-07-12 ENCOUNTER — Inpatient Hospital Stay (HOSPITAL_COMMUNITY): Payer: Managed Care, Other (non HMO)

## 2017-07-12 LAB — PROTIME-INR
INR: 2.54
PROTHROMBIN TIME: 27.1 s — AB (ref 11.4–15.2)

## 2017-07-12 LAB — DIGOXIN LEVEL: Digoxin Level: <0.2 ng/mL — ABNORMAL LOW (ref 0.8–2.0)

## 2017-07-12 MED ORDER — WARFARIN SODIUM 2 MG PO TABS
4.0000 mg | ORAL_TABLET | Freq: Once | ORAL | Status: AC
  Administered 2017-07-12: 4 mg via ORAL
  Filled 2017-07-12: qty 2

## 2017-07-12 NOTE — Patient Care Conference (Signed)
Inpatient RehabilitationTeam Conference and Plan of Care Update Date: 07/11/2017   Time: 11:15 AM    Patient Name: Angel Davies      Medical Record Number: [id number]  Date of Birth: October 26, 1970 Sex: Male         Room/Bed: 4M01C/4M01C-01 Payor Info: Payor: [employer] / Plan: Market researcher / Product Type: *No Product type* /    Admitting Diagnosis: CVA  Admit Date/Time:  07/06/2017  5:10 PM Admission Comments: No comment available   Primary Diagnosis:  <principal problem not specified> Principal Problem: <principal problem not specified>  Patient Active Problem List   Diagnosis Date Noted  . Right pontine stroke ([place]) 07/06/2017  . Left hemiparesis ([place])   . Cardiomyopathy, ischemic   . LV (left ventricular) mural thrombus following MI ([place])   . Acute systolic heart failure ([place])   . Myocardiopathy ([place])   . Mixed hyperlipidemia   . CVA (cerebral vascular accident) ([place]) 07/02/2017  . Alcohol use 07/02/2017  . Anxiety 07/02/2017  . Depression 07/02/2017  . Acute hyperglycemia 07/02/2017  . Elevated blood-pressure reading without diagnosis of hypertension 07/02/2017  . ADHD 07/02/2017  . Cocaine abuse ([place]) 07/02/2017  . Marijuana abuse 06/20/2017  . Cocaine use 06/20/2017    Expected Discharge Date: Expected Discharge Date: 07/27/17  Team Members Present: Physician leading conference: Dr. Alysia Penna Social Worker Present: Alfonse Alpers, LCSW Nurse Present: Leonette Nutting, RN PT Present: Canary Brim, PT OT Present: Cherylynn Ridges, OT SLP Present: Weston Anna, SLP PPS Coordinator present : Daiva Nakayama, RN, CRRN     Current Status/Progress Goal Weekly Team Focus  Medical   Left foot drop, tolerating therapy well without chest pains.  Endurance improving  Complete inpatient rehabilitation without cardiac symptoms or worsening of congestive heart failure  Orthotic management   Bowel/Bladder   continent of bowel and bladder LBM 07/07/17  Remain free of infection,  continue to be continent of bowel and bladder, free of constipation  Monitor urine and fecal output assess for constipation and treat with prn bowel regimen    Swallow/Nutrition/ Hydration             ADL's   Mod A overall  mod I BADL  Supervision iADL  L NMR, functional use of L UE, modified bathing dressing, standing balance/endurance   Mobility   min<>close supervision for transfers, mod for gait with RW,  mod I transfers, supervision gait with LRAD  LLE NMR, balance, safety awareness, progressing all functional mobility    Communication   Mod I  Mod I  Goals Met, D/C from SLP   Safety/Cognition/ Behavioral Observations  Baseline    remain free of falls with no change in mentation or behavior  No SLP needs    Pain   Pain controlled pt has tylenol prn  pain less than <2  Assess pain Q shift and prn medicate and check for effectiveness. Notify MD for uncontrolled pain   Skin   skin is intact   Remain free of skin breakdown   Continue with assessment and braden scale scoring. Encourage measures to prevent breakdown    Rehab Goals Patient on target to meet rehab goals: Yes Rehab Goals Revised: none *See Care Plan and progress notes for long and short-term goals.     Barriers to Discharge  Current Status/Progress Possible Resolutions Date Resolved   Physician    Other (comments);Medical stability  Substance abuse history  Progressing towards goals  Continue rehabilitation      Nursing  PT                    OT                  SLP                SW                Discharge Planning/Teaching Needs:  Pt plans to return to his home where he lives with his wife and his father.  Pt with mainly mod I goals.  Wife can come for family education, as warranted.   Team Discussion:  Pt told Dr. Letta Pate that he feels like he has more energy.  Pt has a hx of substance use.  CHF team is following pt, but he is doing well medically overall.  Pt is min to mod A overall  with OT and has mod I goals.  PT is asking for an AFO order and has mod I to supervision goals for pt.  ST evaluated pt, but he met all goals and ST was d/c'd.  Pt with ADHD, as well.  Revisions to Treatment Plan:  None - pt's first conference    Continued Need for Acute Rehabilitation Level of Care: The patient requires daily medical management by a physician with specialized training in physical medicine and rehabilitation for the following conditions: Daily direction of a multidisciplinary physical rehabilitation program to ensure safe treatment while eliciting the highest outcome that is of practical value to the patient.: Yes Daily medical management of patient stability for increased activity during participation in an intensive rehabilitation regime.: Yes Daily analysis of laboratory values and/or radiology reports with any subsequent need for medication adjustment of medical intervention for : Neurological problems;Cardiac problems  Terisha Losasso, Silvestre Mesi 07/12/2017, 10:23 AM

## 2017-07-12 NOTE — Progress Notes (Signed)
ANTICOAGULATION CONSULT NOTE - Follow Up Consult  Pharmacy Consult for warfarin Indication: stroke and LV thrombus  No Known Allergies  Patient Measurements: Height: 5\' 9"  (175.3 cm) Weight: 190 lb 4.8 oz (86.3 kg) IBW/kg (Calculated) : 70.7  Vital Signs: BP: 117/65 (12/13 1024) Pulse Rate: 83 (12/13 1024)  Labs: Recent Labs    07/10/17 0514 07/11/17 0514 07/12/17 0631  HGB 15.4  --   --   HCT 48.0  --   --   PLT 277  --   --   LABPROT 24.5* 29.3* 27.1*  INR 2.23 2.81 2.54  CREATININE  --  1.01  --     Estimated Creatinine Clearance: 97.3 mL/min (by C-G formula based on SCr of 1.01 mg/dL).  Assessment: 46 y/o M s/p acute embolic stroke, also found to have LV thrombus. s/p heparin-stopped 12/9. Warfarin started 12/7. Today the INR is 2.54, within therapeutic range. Hemoglobin and platelets stable and WNL. No signs or symptoms of bleeding noted at this time. Warfarin was held 12/9 and 12/10 d/t rapidly increasing INR.   Goal of Therapy:  INR goal 2-3 Monitor platelets by anticoagulation protocol: Yes   Plan:  Give warfarin 4 mg po x 1 Monitor daily INR, CBC, clinical course, s/sx of bleed, PO intake, DDI   Thank you for allowing us to participate in this patients care.  Signe Coltonya C Jaunita Mikels, PharmD Clinical phone for 07/12/2017 from 7a-3:30p: [phone] If after 3:30p, please call main pharmacy at: [phone] 07/12/2017 1:43 PM

## 2017-07-12 NOTE — Progress Notes (Signed)
Orthopedic Tech Progress Note Patient Details:  Angel Davies 04/02/1971 [id number] Brace completed by hanger. Patient ID: Angel Davies, male   DOB: 01/13/1971, 46 y.o.   MRN: [id number]   Angel Davies, Angel Davies 07/12/2017, 3:03 PM

## 2017-07-12 NOTE — Progress Notes (Signed)
Occupational Therapy Session Note  Patient Details  Name: Angel Davies MRN: [id number] Date of Birth: 09-14-1970  Today's Date: 07/12/2017 OT Individual Time: 1415-1530 OT Individual Time Calculation (min): 75 min    Short Term Goals: Week 1:  OT Short Term Goal 1 (Week 1): Pt will stand at sink to groom 2/2 items wiht supervision OT Short Term Goal 2 (Week 1): Pt will don shirt with supervision and no more than 1 cue for hemi techniques OT Short Term Goal 3 (Week 1): Pt will fasten pants to improve [place] wiht up to min A OT Short Term Goal 4 (Week 1): Pt will transfer to toilet/BSC with supervision and LRAD OT Short Term Goal 5 (Week 1): Pt will bathe 50% of body with LUE and AE PRN  Skilled Therapeutic Interventions/Progress Updates:    Pt participated in rehab holiday party. Worked on dynamic standing balance with standing shuffle board activity. Pt needed min A to maintain balance when pushing pucks outside base of support. Pt able to integrate L UE into shuffle board task with min A, but had increased LOB requiring mod A to correct. OT discussed community safety awareness and community acces from wc level. Pt propelled wc to elevators, pushed button with L UE, then backed wc into elevator. OT treatment session focused on standing balance/endurance, functional use of L UE, wc propulsion, and community safety awareness. Pt stood at [place] with min guard A for balance, then was able to grasp wallet with L hand while searching for banking cards. Pt then propelled wc back to day room with 2 rest breaks, and good recall of safety technique for accessing elevator. Pt left seated in wc in day room with needs met.   Therapy Documentation Precautions:  Precautions Precautions: Fall Restrictions Weight Bearing Restrictions: No  Pain:   none/denies pain See Function Navigator for Current Functional Status.  Therapy/Group: Individual Therapy  [name] 07/12/2017, 3:39 PM

## 2017-07-12 NOTE — Progress Notes (Signed)
Social Work Patient ID: Angel Davies, male   DOB: 1971-05-12, 46 y.o.   MRN: [id number]   CSW met with pt to update him on team conference discussion.  Pt was pleased he still had more time on CIR, as he wants to be as well as he can be before going home.  Pt became emotional at times talking about all the people who are depending on him, including his dad for whom he cares for at home following his stroke 5 years ago.  Pt gave CSW permission to update his wife via telephone and CSW will do so as she was not present during [place] visit.  CSW remains available to assist as needed.

## 2017-07-12 NOTE — Progress Notes (Signed)
Subjective/Complaints:  Pt felt encouraged after neuropsych visit, no new c/os  ROS-  No CP, SOB, N/V/D  Objective: Vital Signs: Blood pressure 107/69, pulse 89, temperature 98.1 F (36.7 C), temperature source Oral, resp. rate 18, height 5' 9" (1.753 m), weight 86.3 kg (190 lb 4.8 oz), SpO2 96 %. No results found. Results for orders placed or performed during the hospital encounter of 07/06/17 (from the past 72 hour(s))  Protime-INR     Status: Abnormal   Collection Time: 07/10/17  5:14 AM  Result Value Ref Range   Prothrombin Time 24.5 (H) 11.4 - 15.2 seconds   INR 2.23   CBC     Status: None   Collection Time: 07/10/17  5:14 AM  Result Value Ref Range   WBC 10.3 4.0 - 10.5 K/uL   RBC 5.08 4.22 - 5.81 MIL/uL   Hemoglobin 15.4 13.0 - 17.0 g/dL   HCT 48.0 39.0 - 52.0 %   MCV 94.5 78.0 - 100.0 fL   MCH 30.3 26.0 - 34.0 pg   MCHC 32.1 30.0 - 36.0 g/dL   RDW 14.0 11.5 - 15.5 %   Platelets 277 150 - 400 K/uL  Protime-INR     Status: Abnormal   Collection Time: 07/11/17  5:14 AM  Result Value Ref Range   Prothrombin Time 29.3 (H) 11.4 - 15.2 seconds   INR 1.60   Basic metabolic panel     Status: Abnormal   Collection Time: 07/11/17  5:14 AM  Result Value Ref Range   Sodium 136 135 - 145 mmol/L   Potassium 3.9 3.5 - 5.1 mmol/L   Chloride 103 101 - 111 mmol/L   CO2 24 22 - 32 mmol/L   Glucose, Bld 116 (H) 65 - 99 mg/dL   BUN 14 6 - 20 mg/dL   Creatinine, Ser 1.01 0.61 - 1.24 mg/dL   Calcium 9.2 8.9 - 10.3 mg/dL   GFR calc non Af Amer >60 >60 mL/min   GFR calc Af Amer >60 >60 mL/min    Comment: (NOTE) The eGFR has been calculated using the CKD EPI equation. This calculation has not been validated in all clinical situations. eGFR's persistently <60 mL/min signify possible Chronic Kidney Disease.    Anion gap 9 5 - 15  Protime-INR     Status: Abnormal   Collection Time: 07/12/17  6:31 AM  Result Value Ref Range   Prothrombin Time 27.1 (H) 11.4 - 15.2 seconds   INR  2.54      HEENT: normal Cardio: RRR and no murmur Resp: CTA B/L and unlabored GI: BS positive and NT, ND Extremity:  Pulses positive and No Edema Skin:   Intact and Other IV site ok Neuro: Alert/Oriented, Normal Sensory, Abnormal Motor 3 left delt bi tri FF, 3+ L HF, KE 0 ADF and Abnormal FMC, no visual field def Ataxic/ dec FMC Musc/Skel:  Normal Gen NAD   Assessment/Plan: 1. 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 Physiatrist is providing close team supervision and 24 hour management of active medical problems listed below. Physiatrist and rehab team continue to assess barriers to discharge/monitor patient progress toward functional and medical goals. FIM: Function - Bathing Bathing activity did not occur: Refused Position: Shower Body parts bathed by patient: Left arm, Chest, Abdomen, Front perineal area, Buttocks, Left upper leg, Right upper leg Body parts bathed by helper: Left lower leg, Right lower  leg, Right arm, Back Assist Level: Touching or steadying assistance(Pt > 75%)  Function- Upper Body Dressing/Undressing What is the patient wearing?: Pull over shirt/dress Pull over shirt/dress - Perfomed by patient: Thread/unthread right sleeve, Put head through opening Pull over shirt/dress - Perfomed by helper: Thread/unthread left sleeve Assist Level: Touching or steadying assistance(Pt > 75%) Function - Lower Body Dressing/Undressing What is the patient wearing?: Socks, Shoes, Underwear, Pants Position: Wheelchair/chair at sink Underwear - Performed by patient: Thread/unthread right underwear leg Underwear - Performed by helper: Thread/unthread left underwear leg, Pull underwear up/down Pants- Performed by patient: Thread/unthread right pants leg Pants- Performed by helper: Pull pants up/down, Thread/unthread left pants leg Non-skid slipper  socks- Performed by helper: Don/doff right sock, Don/doff left sock Socks - Performed by helper: Don/doff right sock, Don/doff left sock Shoes - Performed by helper: Don/doff right shoe, Don/doff left shoe, Fasten right, Fasten left Assist for footwear: Maximal assist Assist for lower body dressing: Touching or steadying assistance (Pt > 75%)  Function - Toileting Toileting steps completed by patient: Performs perineal hygiene Toileting steps completed by helper: Adjust clothing prior to toileting, Performs perineal hygiene, Adjust clothing after toileting Toileting Assistive Devices: Grab bar or rail Assist level: Touching or steadying assistance (Pt.75%)  Function - Air cabin crew transfer activity did not occur: N/A Toilet transfer assistive device: Grab bar Assist level to toilet: Moderate assist (Pt 50 - 74%/lift or lower) Assist level from toilet: Moderate assist (Pt 50 - 74%/lift or lower)  Function - Chair/bed transfer Chair/bed transfer activity did not occur: N/A Chair/bed transfer method: Stand pivot Chair/bed transfer assist level: Touching or steadying assistance (Pt > 75%) Chair/bed transfer assistive device: Armrests, Walker Chair/bed transfer details: Verbal cues for precautions/safety, Verbal cues for safe use of DME/AE  Function - Locomotion: Wheelchair Will patient use wheelchair at discharge?: Yes Type: Manual Max wheelchair distance: 177f Assist Level: Touching or steadying assistance (Pt > 75%) Assist Level: Touching or steadying assistance (Pt > 75%) Wheel 150 feet activity did not occur: Safety/medical concerns Function - Locomotion: Ambulation Assistive device: Walker-rolling, Orthosis Max distance: 150 Assist level: Moderate assist (Pt 50 - 74%) Assist level: Moderate assist (Pt 50 - 74%) Walk 50 feet with 2 turns activity did not occur: Safety/medical concerns Assist level: Moderate assist (Pt 50 - 74%) Walk 150 feet activity did not occur:  Safety/medical concerns Assist level: Moderate assist (Pt 50 - 74%) Walk 10 feet on uneven surfaces activity did not occur: Safety/medical concerns  Function - Comprehension Comprehension: Auditory Comprehension assist level: Follows complex conversation/direction with no assist  Function - Expression Expression: Verbal Expression assist level: Expresses complex ideas: With extra time/assistive device  Function - Social Interaction Social Interaction assist level: Interacts appropriately with others with medication or extra time (anti-anxiety, antidepressant).  Function - Problem Solving Problem solving assist level: Solves complex problems: Recognizes & self-corrects  Function - Memory Memory assist level: More than reasonable amount of time Patient normally able to recall (first 3 days only): Current season, Location of own room, Staff names and faces, That he or she is in a hospital  Medical Problem List and Plan:  1. Functional deficits and left hemiparesis secondary to acute right pontine infarct/subacute right temporal/occipital infarcts  CIR PT, OT, SLP - Cont CIR,discussed d/c date and that this would be evaluated on a weekly basis 2. DVT Prophylaxis/Anticoagulation: Pharmaceutical: INR therapeutic, off heparin 3. Pain Management: tylenol prn  4. Mood: LCSW to follow for evaluation and support.  Will ask  neuropsychology to-follow-up with the patient as he is expressing signs and symptoms of depression  5. Neuropsych: This patient is capable of making decisions on his own behalf.  6. Skin/Wound Care: Routine pressure relief measures.  7. Fluids/Electrolytes/Nutrition: Monitor I/O. Lytes normal 12/8  8. Severe CAD with CM: Treated medically with ASA, statin , no angina 9. Acute systolic CHF: Was started spironolactone and digoxin on 12/7. BP soft , Cardiology to follow, avoid hypotension/hypoperfusion. Vitals:   07/11/17 0843 07/11/17 1456  BP: 108/66 107/69  Pulse: 81 89   Resp:  18  Temp:  98.1 F (36.7 C)  SpO2:  96%   10.  Apical thrombus cont warfarin, INR 2.54 as per pharm protocol 11. Prediabetes: Hgb A1C- 6.0. Will have dietitian educate patient on CM diet--monitor BS for trends.   12. Polysubstance abuse: Has been counseled on importance of alcohol, cocaine and THC cessation-appreciate Neuropsych consult    LOS (Days) 6 A FACE TO FACE EVALUATION WAS PERFORMED  Charlett Blake 07/12/2017, 7:26 AM

## 2017-07-12 NOTE — Progress Notes (Signed)
Physical Therapy Session Note  Patient Details  Name: Angel Davies MRN: [id number] Date of Birth: 1970/11/12  Today's Date: 07/12/2017 PT Individual Time: 0800-0900 PT Individual Time Calculation (min): 60 min    Short Term Goals: Week 1:  PT Short Term Goal 1 (Week 1): Pt will perform bed mobility with supervision assist PT Short Term Goal 2 (Week 1): Pt will perform trasnfers with supervision assist  PT Short Term Goal 3 (Week 1): Pt will ambulate 162f with min assist  PT Short Term Goal 4 (Week 1): Pt will propell WC 1052fwith supervision assist.   Skilled Therapeutic Interventions/Progress Updates:   Pt received supine in bed and agreeable to PT. Supine>sit transfer with supervision assist and min cues. PT donned Bilateral shoes and socks for time management.   [facility] from [facility].   [facility] without AD [id number] assist from PT. PT placed 2 lb ankle weight over LLE to improve proprioceptive feedback; decreased ataxia noted with ankle weight in place. PT also instructed pt in gait training with SBQC 60ft 55fwith min-mod assist with moderate cues for reciprocal gait pattern and AD management.   Variable gait training at rail in hall. Side stepping, forward/ backward. 3 x 15 ft each with min-mod assist and moderate cues for proper step length, postural control.  Max cues for sequencing with retropulsion, only minor improvements notes.   Per pt request, Pt transported to day room and [facility].          Therapy Documentation Precautions:  Precautions Precautions: Fall Restrictions Weight Bearing Restrictions: No Pain: 0/10   See Function Navigator for Current Functional Status.   Therapy/Group: Individual Therapy  AustinLorie Phenix/2018, 10:01 AM

## 2017-07-12 NOTE — Progress Notes (Signed)
Physical Therapy Session Note  Patient Details  Name: Angel CooperJames D Kloosterman MRN: [id number] Date of Birth: 05/25/1971  Today's Date: 07/12/2017 PT Individual Time: 1100-1200 PT Individual Time Calculation (min): 60 min   Short Term Goals: Week 1:  PT Short Term Goal 1 (Week 1): Pt will perform bed mobility with supervision assist PT Short Term Goal 2 (Week 1): Pt will perform trasnfers with supervision assist  PT Short Term Goal 3 (Week 1): Pt will ambulate 13700ft with min assist  PT Short Term Goal 4 (Week 1): Pt will propell WC 16800ft with supervision assist.   Skilled Therapeutic Interventions/Progress Updates:    Session focused on NMR to address LUE/LLE motor control, coordination, postural control, and balance during functional transfers, Biodex training (random control and limits of stability programs on compliant (level 5) and non compliant surfaces (pt requires use of BUE for support for balance - forced weightbearing through LUE during activity for neuro feedback), Nustep on level 4 with LUE and BLE with focus on maintaining neutral position of LLE (improved), and gait with RW (with L hand orthosis) on carpeted surface with focus on slowing gait speed to improve coordination and control, L foot placement and RW positioning during turning, general safety awareness, and postural control during mobility. Pt requires cues throughout session for safety due to impulsivity.   Therapy Documentation Precautions:  Precautions Precautions: Fall Restrictions Weight Bearing Restrictions: No  Pain: Pain Assessment Pain Assessment: No/denies pain   See Function Navigator for Current Functional Status.   Therapy/Group: Individual Therapy  Karolee StampsGray, Kailan Carmen Darrol PokeBrescia  Aubriana Ravelo B. Amor Hyle, PT, DPT  07/12/2017, 12:03 PM

## 2017-07-13 ENCOUNTER — Inpatient Hospital Stay (HOSPITAL_COMMUNITY): Payer: Managed Care, Other (non HMO) | Admitting: Physical Therapy

## 2017-07-13 ENCOUNTER — Inpatient Hospital Stay (HOSPITAL_COMMUNITY): Payer: Managed Care, Other (non HMO)

## 2017-07-13 ENCOUNTER — Inpatient Hospital Stay (HOSPITAL_COMMUNITY): Payer: Managed Care, Other (non HMO) | Admitting: Occupational Therapy

## 2017-07-13 LAB — CBC
HEMATOCRIT: 46.8 % (ref 39.0–52.0)
Hemoglobin: 15.5 g/dL (ref 13.0–17.0)
MCH: 30.8 pg (ref 26.0–34.0)
MCHC: 33.1 g/dL (ref 30.0–36.0)
MCV: 93 fL (ref 78.0–100.0)
Platelets: 278 10*3/uL (ref 150–400)
RBC: 5.03 MIL/uL (ref 4.22–5.81)
RDW: 13.6 % (ref 11.5–15.5)
WBC: 11.1 10*3/uL — AB (ref 4.0–10.5)

## 2017-07-13 LAB — BASIC METABOLIC PANEL
ANION GAP: 9 (ref 5–15)
BUN: 14 mg/dL (ref 6–20)
CALCIUM: 9.3 mg/dL (ref 8.9–10.3)
CO2: 25 mmol/L (ref 22–32)
CREATININE: 0.91 mg/dL (ref 0.61–1.24)
Chloride: 105 mmol/L (ref 101–111)
Glucose, Bld: 120 mg/dL — ABNORMAL HIGH (ref 65–99)
Potassium: 4 mmol/L (ref 3.5–5.1)
SODIUM: 139 mmol/L (ref 135–145)

## 2017-07-13 LAB — PROTIME-INR
INR: 3.01
PROTHROMBIN TIME: 31 s — AB (ref 11.4–15.2)

## 2017-07-13 MED ORDER — WARFARIN SODIUM 2.5 MG PO TABS
2.5000 mg | ORAL_TABLET | Freq: Once | ORAL | Status: AC
  Administered 2017-07-13: 2.5 mg via ORAL
  Filled 2017-07-13: qty 1

## 2017-07-13 NOTE — Progress Notes (Signed)
Subjective/Complaints:  Father admitted to hospital yesterday, "fluid on lungs" ROS-  No CP, SOB, N/V/D  Objective: Vital Signs: Blood pressure 126/74, pulse 78, temperature 98 F (36.7 C), temperature source Oral, resp. rate 17, height _0  (1.753 m), weight 86.3 kg (190 lb 4.8 oz), SpO2 97 %. No results found. Results for orders placed or performed during the hospital encounter of 07/06/17 (from the past 72 hour(s))  Protime-INR     Status: Abnormal   Collection Time: 07/11/17  5:14 AM  Result Value Ref Range   Prothrombin Time 29.3 (H) 11.4 - 15.2 seconds   INR 3.29   Basic metabolic panel     Status: Abnormal   Collection Time: 07/11/17  5:14 AM  Result Value Ref Range   Sodium 136 135 - 145 mmol/L   Potassium 3.9 3.5 - 5.1 mmol/L   Chloride 103 101 - 111 mmol/L   CO2 24 22 - 32 mmol/L   Glucose, Bld 116 (H) 65 - 99 mg/dL   BUN 14 6 - 20 mg/dL   Creatinine, Ser 1.01 0.61 - 1.24 mg/dL   Calcium 9.2 8.9 - 10.3 mg/dL   GFR calc non Af Amer >60 >60 mL/min   GFR calc Af Amer >60 >60 mL/min    Comment: (NOTE) The eGFR has been calculated using the CKD EPI equation. This calculation has not been validated in all clinical situations. eGFR's persistently <60 mL/min signify possible Chronic Kidney Disease.    Anion gap 9 5 - 15  Protime-INR     Status: Abnormal   Collection Time: 07/12/17  6:31 AM  Result Value Ref Range   Prothrombin Time 27.1 (H) 11.4 - 15.2 seconds   INR 2.54   Digoxin level     Status: Abnormal   Collection Time: 07/12/17  6:31 AM  Result Value Ref Range   Digoxin Level <0.2 (L) 0.8 - 2.0 ng/mL    Comment: RESULTS CONFIRMED BY MANUAL DILUTION     HEENT: normal Cardio: RRR and no murmur Resp: CTA B/L and unlabored GI: BS positive and NT, ND Extremity:  Pulses positive and No Edema Skin:   Intact and Other IV site ok Neuro: Alert/Oriented, Normal Sensory, Abnormal Motor 3 left delt bi tri FF, 3+ L HF, KE 0 ADF and Abnormal FMC, no visual field def  Ataxic/ dec FMC Musc/Skel:  Normal Gen NAD   Assessment/Plan: 1. 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 Physiatrist is providing close team supervision and 24 hour management of active medical problems listed below. Physiatrist and rehab team continue to assess barriers to discharge/monitor patient progress toward functional and medical goals. FIM: Function - Bathing Bathing activity did not occur: Refused Position: Shower Body parts bathed by patient: Left arm, Chest, Abdomen, Front perineal area, Buttocks, Left upper leg, Right upper leg Body parts bathed by helper: Left lower leg, Right lower leg, Right arm, Back Assist Level: Touching or steadying assistance(Pt > 75%)  Function- Upper Body Dressing/Undressing What is the patient wearing?: Pull over shirt/dress Pull over shirt/dress - Perfomed by patient: Thread/unthread right sleeve, Put head through opening Pull over shirt/dress - Perfomed by helper: Thread/unthread left sleeve Assist Level: Touching or steadying assistance(Pt > 75%) Function - Lower Body Dressing/Undressing What is the patient wearing?: Socks, Shoes, Underwear, Pants Position: Wheelchair/chair at sink Underwear - Performed by patient: Thread/unthread right underwear leg Underwear - Performed by helper: Thread/unthread  left underwear leg, Pull underwear up/down Pants- Performed by patient: Thread/unthread right pants leg Pants- Performed by helper: Pull pants up/down, Thread/unthread left pants leg Non-skid slipper socks- Performed by helper: Don/doff right sock, Don/doff left sock Socks - Performed by helper: Don/doff right sock, Don/doff left sock Shoes - Performed by helper: Don/doff right shoe, Don/doff left shoe, Fasten right, Fasten left Assist for footwear: Maximal assist Assist for lower body dressing: Touching or  steadying assistance (Pt > 75%)  Function - Toileting Toileting steps completed by patient: Performs perineal hygiene Toileting steps completed by helper: Adjust clothing prior to toileting, Performs perineal hygiene, Adjust clothing after toileting Toileting Assistive Devices: Grab bar or rail Assist level: Touching or steadying assistance (Pt.75%)  Function - Air cabin crew transfer activity did not occur: N/A Toilet transfer assistive device: Grab bar Assist level to toilet: Moderate assist (Pt 50 - 74%/lift or lower) Assist level from toilet: Moderate assist (Pt 50 - 74%/lift or lower)  Function - Chair/bed transfer Chair/bed transfer activity did not occur: N/A Chair/bed transfer method: Stand pivot Chair/bed transfer assist level: Touching or steadying assistance (Pt > 75%) Chair/bed transfer assistive device: Walker, Armrests, Orthosis Chair/bed transfer details: Verbal cues for precautions/safety, Verbal cues for safe use of DME/AE  Function - Locomotion: Wheelchair Will patient use wheelchair at discharge?: Yes Type: Manual Max wheelchair distance: 130f Assist Level: Touching or steadying assistance (Pt > 75%) Assist Level: Touching or steadying assistance (Pt > 75%) Wheel 150 feet activity did not occur: Safety/medical concerns Function - Locomotion: Ambulation Assistive device: Walker-rolling, Orthosis Max distance: 150' Assist level: Touching or steadying assistance (Pt > 75%) Assist level: Touching or steadying assistance (Pt > 75%) Walk 50 feet with 2 turns activity did not occur: Safety/medical concerns Assist level: Touching or steadying assistance (Pt > 75%) Walk 150 feet activity did not occur: Safety/medical concerns Assist level: Touching or steadying assistance (Pt > 75%) Walk 10 feet on uneven surfaces activity did not occur: Safety/medical concerns  Function - Comprehension Comprehension: Auditory Comprehension assist level: Follows complex  conversation/direction with no assist  Function - Expression Expression: Verbal Expression assist level: Expresses complex ideas: With no assist  Function - Social Interaction Social Interaction assist level: Interacts appropriately with others - No medications needed.  Function - Problem Solving Problem solving assist level: Solves complex problems: Recognizes & self-corrects  Function - Memory Memory assist level: More than reasonable amount of time Patient normally able to recall (first 3 days only): Current season, Location of own room, Staff names and faces, That he or she is in a hospital  Medical Problem List and Plan:  1. Functional deficits and left hemiparesis secondary to acute right pontine infarct/subacute right temporal/occipital infarcts  CIR PT, OT, SLP - Cont CIR- discussed tent d/c date 12/28 2. DVT Prophylaxis/Anticoagulation: Pharmaceutical: INR therapeutic, off heparin 3. Pain Management: tylenol prn  4. Mood: LCSW to follow for evaluation and support.  Will ask neuropsychology to-follow-up with the patient as he is expressing signs and symptoms of depression  5. Neuropsych: This patient is capable of making decisions on his own behalf.  6. Skin/Wound Care: Routine pressure relief measures.  7. Fluids/Electrolytes/Nutrition: Monitor I/O. Lytes normal 12/12, I 720 O: 450 8. Severe CAD with CM: Treated medically with ASA, statin , no angina,  9. Acute systolic CHF: Was started spironolactone and digoxin on 12/7. BP soft , Cardiology to follow, avoid hypotension/hypoperfusion. No clinical signs of failure 12/14 Vitals:   07/12/17 1800 07/13/17 0605  BP:  110/64 126/74  Pulse:  78  Resp: 18 17  Temp:  98 F (36.7 C)  SpO2:  97%   10.  Apical thrombus cont warfarin, INR 2.54 as per pharm protocol 11. Prediabetes: Hgb A1C- 6.0. Will have dietitian educate patient on CM diet--monitor BS for trends.   12. Polysubstance abuse: Has been counseled on importance of  alcohol, cocaine and THC cessation-appreciate Neuropsych consult    LOS (Days) 7 A FACE TO FACE EVALUATION WAS PERFORMED  Charlett Blake 07/13/2017, 6:56 AM

## 2017-07-13 NOTE — Progress Notes (Signed)
ANTICOAGULATION CONSULT NOTE - Follow Up Consult  Pharmacy Consult for warfarin Indication: stroke and LV thrombus  No Known Allergies  Patient Measurements: Height: 5\' 9"  (175.3 cm) Weight: 190 lb 4.8 oz (86.3 kg) IBW/kg (Calculated) : 70.7  Vital Signs: Temp: 98 F (36.7 C) (12/14 0605) Temp Source: Oral (12/14 0605) BP: 126/74 (12/14 0605) Pulse Rate: 78 (12/14 0605)  Labs: Recent Labs    07/11/17 0514 07/12/17 0631 07/13/17 0734  HGB  --   --  15.5  HCT  --   --  46.8  PLT  --   --  278  LABPROT 29.3* 27.1* 31.0*  INR 2.81 2.54 3.01  CREATININE 1.01  --  0.91    Estimated Creatinine Clearance: 108 mL/min (by C-G formula based on SCr of 0.91 mg/dL).  Assessment: 46 y/o M s/p acute embolic stroke, also found to have LV thrombus. s/p heparin-stopped 12/9. Warfarin started 12/7. Today the INR is 3.01, therapeutic. Patient seems to be very sensitive to warfarin. Hemoglobin and platelets stable and WNL. No signs or symptoms of bleeding noted at this time. Warfarin was held 12/9 and 12/10 d/t rapidly increasing INR.   Goal of Therapy:  INR goal 2-3 Monitor platelets by anticoagulation protocol: Yes   Plan:  Give warfarin 2.5 mg po x 1 Monitor daily INR, CBC, clinical course, s/sx of bleed, PO intake, DDI   Thank you for allowing us to participate in this patients care.  Signe Coltonya C Kowen Kluth, PharmD Clinical phone for 07/13/2017 from 7a-3:30p: [phone] If after 3:30p, please call main pharmacy at: [phone] 07/13/2017 12:36 PM

## 2017-07-13 NOTE — Progress Notes (Signed)
[facility] Individual Statement of Services  Patient Name:  Angel Davies  Date:  07/13/2017  Welcome to the [facility].  Our goal is to provide you with an individualized program based on your diagnosis and situation, designed to meet your specific needs.  With this comprehensive rehabilitation program, you will be expected to participate in at least 3 hours of rehabilitation therapies Monday-Friday, with modified therapy programming on the weekends.  Your rehabilitation program will include the following services:  Physical Therapy (PT), Occupational Therapy (OT), Speech Therapy (ST), 24 hour per day rehabilitation nursing, Therapeutic Recreaction (TR), Neuropsychology, Case Management (Social Worker), Rehabilitation Medicine, Nutrition Services and Pharmacy Services  Weekly team conferences will be held on Wednesdays to discuss your progress.  Your Social Worker will talk with you frequently to get your input and to update you on team discussions.  Team conferences with you and your family in attendance may also be held.  Expected length of stay: 3 weeks  Overall anticipated outcome: Modified Independent with supervision for bathing, meal prep, car transfers, and ambulation  Depending on your progress and recovery, your program may change. Your Social Worker will coordinate services and will keep you informed of any changes. Your Social Worker's name and contact numbers are listed  below.  The following services may also be recommended but are not provided by the [facility]:   Driving Evaluations  Home Health Rehabiltiation Services  Outpatient Rehabilitation Services  Vocational Rehabilitation   Arrangements will be made to provide these services after discharge if needed.  Arrangements include referral to agencies that provide these services.  Your insurance has been verified to be:  [employer] Your primary doctor is:  Pt  will likely start going to his wife's PCP, Dr. Tenny Crawoss  Pertinent information will be shared with your doctor and your insurance company.  Social Worker:  Staci AcostaJenny Carmencita Cusic, LCSW  [phone] or [phone]) [phone]  Information discussed with and copy given to patient by: Angel Davies, Angel Davies, 07/13/2017, 11:51 PM

## 2017-07-13 NOTE — Progress Notes (Signed)
Occupational Therapy Session Note  Patient Details  Name: [name] MRN: [id number] Date of Birth: February 28, 1971  Today's Date: 07/13/2017 OT Individual Time: 9163-8466 OT Individual Time Calculation (min): 72 min    Skilled Therapeutic Interventions/Progress Updates:    1:1. Pt ambulates to/from all tx destinations with min A for balance and VC for quad cane management. Pt stands on blue foam pad with CGA for standing balance while playing hangman on wall. Pt plays games of connect four/dominoes for in hand manipulation skills, rotation, grasp and gross reach with up to min A for smooth gross LUE movements. Pt requries VC for decreasing compensatory movements (shoulder hike) and breathing. Exited session with pt seated in w.c with call lgiht in reahc and all needs met.  Therapy Documentation Precautions:  Precautions Precautions: Fall Restrictions Weight Bearing Restrictions: No  See Function Navigator for Current Functional Status.   Therapy/Group: Individual Therapy  Tonny Branch 07/13/2017, 3:13 PM

## 2017-07-13 NOTE — Progress Notes (Signed)
Physical Therapy Session Note  Patient Details  Name: Angel Davies MRN: [id number] Date of Birth: 1971/04/26  Today's Date: 07/13/2017 PT Individual Time: 1005-1105 PT Individual Time Calculation (min): 60 min   Short Term Goals: Week 1:  PT Short Term Goal 1 (Week 1): Pt will perform bed mobility with supervision assist PT Short Term Goal 2 (Week 1): Pt will perform trasnfers with supervision assist  PT Short Term Goal 3 (Week 1): Pt will ambulate 167f with min assist  PT Short Term Goal 4 (Week 1): Pt will propell WC 107fwith supervision assist.   Skilled Therapeutic Interventions/Progress Updates:   Pt received sitting in WC and agreeable to PT  WC mobility with supervision assist and hemi technique throughout hall to find 4 E unit. Pt required min cues for location of signs in hospital.  Gait training with RW  X [id number] assist from PT and min cues for improved step height and decreased step length to improve symmetry and prevent GR. Gait training with SBQC [id number]. Min assist overall with mod assist x 1 in turn to the R. Pt noted to have decreased step length and improved foot clearance with the QC compared to RW. But continues to require cues for knee control.    Foot taps on 1 of 2 targets 8 x 2 bilaterally mod-max assist from PT to stabilize pelvis when standing on the LLE. Max cues for hip and knee control and as well improve trunk rotation and decreased compensation resulting in posterior LOB. Pt able to progress to consisently Mod assist  Reciprocal stepping to 2 inch step. Mod assistoverall and cues for improved weight shifting and hip flexion to clear edge of step.   Sit<>stand and stand pivot transfers with min assist throughout treatment and no AD.   Patient returned to room and [facility] and all needs met.          Therapy Documentation Precautions:  Precautions Precautions: Fall Restrictions Weight Bearing Restrictions:  No Pain : 0/10   See Function Navigator for Current Functional Status.   Therapy/Group: Individual Therapy  AustLorie Phenix14/2018, 11:20 AM

## 2017-07-13 NOTE — Progress Notes (Signed)
Occupational Therapy Weekly Progress Note  Patient Details  Name: Angel Davies MRN: [id number] Date of Birth: 1970-09-29  Beginning of progress report period: July 07, 2017 End of progress report period: July 13, 2017  Today's Date: 07/13/2017 OT Individual Time: 0802-0900 OT Individual Time Calculation (min): 58 min    Patient has met 4 of 5 short term goals.  Pt is making steady progress with OT treatments at this time.  He is able to complete all transfers with consistent min A.  LUE function continues to improve to a Min A level for bathing and dressing tasks. Continue with current POC  Patient continues to demonstrate the following deficits: muscle weakness, impaired timing and sequencing, abnormal tone, unbalanced muscle activation and decreased coordination and decreased standing balance, decreased postural control, hemiplegia and decreased balance strategies and therefore will continue to benefit from skilled OT intervention to enhance overall performance with BADL.  Patient progressing toward long term goals..  Continue plan of care.  OT Short Term Goals Week 1:  OT Short Term Goal 1 (Week 1): Pt will stand at sink to groom 2/2 items wiht supervision OT Short Term Goal 1 - Progress (Week 1): Met OT Short Term Goal 2 (Week 1): Pt will don shirt with supervision and no more than 1 cue for hemi techniques OT Short Term Goal 2 - Progress (Week 1): Met OT Short Term Goal 3 (Week 1): Pt will fasten pants to improve [place] wiht up to min A OT Short Term Goal 3 - Progress (Week 1): Met OT Short Term Goal 4 (Week 1): Pt will transfer to toilet/BSC with supervision and LRAD OT Short Term Goal 4 - Progress (Week 1): Progressing toward goal OT Short Term Goal 5 (Week 1): Pt will bathe 50% of body with LUE and AE PRN OT Short Term Goal 5 - Progress (Week 1): Met Week 2:  OT Short Term Goal 1 (Week 2): Pt will transfer to toilet/BSC with supervision and LRAD OT Short Term Goal 2 (Week  2): Pt will demonstrate proficiency in L UE self ROM OT Short Term Goal 3 (Week 2): Pt ambulate in room with LRAD  to collect clothing for BADL tasks with Min A  Skilled Therapeutic Interventions/Progress Updates:    OT treatment session focused on modified bathing/dressing, standing balance, and functional ambulation. Pt ambulated in room with RW and L hand splint w/ min A and intermittent Mod A for lateral LOB. Educated pt on RW placement to access dresser drawers and addressed dynamic standing balance while reaching forward to collect clothing. Pt needed min/mod A to maintain balance while reaching lowest drawer. Bathing completed with overall set-up A/supervision with leaning method used to wash buttocks. Pt able to grasp wash cloth with L UE and wash 50% of body with L arm. Pt needed min cues to recall hemi-dressing techniques. OT provided pt with shoe buttons and demonstrated adapted strategy for donning shoes and AFO with Mod A. Pt left seated in wc at end of session with needs met.    Therapy Documentation Precautions:  Precautions Precautions: Fall Restrictions Weight Bearing Restrictions: No General:   Vital Signs: Therapy Vitals Temp: 98 F (36.7 C) Temp Source: Oral Pulse Rate: 78 Resp: 17 BP: 126/74 Patient Position (if appropriate): Lying Oxygen Therapy SpO2: 97 % O2 Device: Not Delivered Pain:   ADL:   Vision   Perception    Praxis   Exercises:   Other Treatments:    See Function Navigator for Current  Functional Status.   Therapy/Group: Individual Therapy  [name] 07/13/2017, 7:01 AM

## 2017-07-14 ENCOUNTER — Inpatient Hospital Stay (HOSPITAL_COMMUNITY): Payer: Managed Care, Other (non HMO) | Admitting: Occupational Therapy

## 2017-07-14 LAB — PROTIME-INR
INR: 3.38
Prothrombin Time: 33.9 seconds — ABNORMAL HIGH (ref 11.4–15.2)

## 2017-07-14 MED ORDER — WARFARIN SODIUM 2.5 MG PO TABS
2.5000 mg | ORAL_TABLET | Freq: Once | ORAL | Status: AC
  Administered 2017-07-14: 2.5 mg via ORAL
  Filled 2017-07-14: qty 1

## 2017-07-14 NOTE — Progress Notes (Signed)
ANTICOAGULATION CONSULT NOTE - Follow Up Consult  Pharmacy Consult for warfarin Indication: stroke and LV thrombus  No Known Allergies  Patient Measurements: Height: 5\' 9"  (175.3 cm) Weight: 190 lb 4.8 oz (86.3 kg) IBW/kg (Calculated) : 70.7  Vital Signs: Temp: 98 F (36.7 C) (12/15 0700) Temp Source: Oral (12/15 0700) BP: 114/65 (12/15 0700) Pulse Rate: 71 (12/15 0700)  Labs: Recent Labs    07/12/17 0631 07/13/17 0734 07/14/17 0711  HGB  --  15.5  --   HCT  --  46.8  --   PLT  --  278  --   LABPROT 27.1* 31.0* 33.9*  INR 2.54 3.01 3.38  CREATININE  --  0.91  --     Estimated Creatinine Clearance: 108 mL/min (by C-G formula based on SCr of 0.91 mg/dL).  Assessment: 46 y/o M s/p acute embolic stroke, also found to have LV thrombus. s/p heparin-stopped 12/9. Warfarin started 12/7. Patient seems to be very sensitive to warfarin, possibly d/t fenofibrate. Warfarin was held 12/9 and 12/10 d/t rapidly increasing INR.   INR is supratherapeutic today at 3.38, likely seeing full effects of 5mg  dose + 4mg  dose, anticipate downtrend with 2.5mg  doses.  No new CBC today but has been wnl and stable, no bleeding observed at this time.    Goal of Therapy:  INR goal 2-3 Monitor platelets by anticoagulation protocol: Yes   Plan:  Warfarin 2.5mg  x 1 dose Monitor daily INR, s/sx of bleed, PO intake, DDI Weekly CBC  Angel PoseyJonathan Ayoub Davies, PharmD Pharmacy Resident Pager #: [phone] 07/14/2017 12:49 PM

## 2017-07-14 NOTE — Progress Notes (Signed)
Subjective/Complaints:  Patient sitting edge of bed about to eat breakfast.  Very appreciative of staff and team.  ROS: pt denies nausea, vomiting, diarrhea, cough, shortness of breath or chest pain   Objective: Vital Signs: Blood pressure 114/65, pulse 71, temperature 98 F (36.7 C), temperature source Oral, resp. rate 18, height _0  (1.753 m), weight 86.3 kg (190 lb 4.8 oz), SpO2 96 %. No results found. Results for orders placed or performed during the hospital encounter of 07/06/17 (from the past 72 hour(s))  Protime-INR     Status: Abnormal   Collection Time: 07/12/17  6:31 AM  Result Value Ref Range   Prothrombin Time 27.1 (H) 11.4 - 15.2 seconds   INR 2.54   Digoxin level     Status: Abnormal   Collection Time: 07/12/17  6:31 AM  Result Value Ref Range   Digoxin Level <0.2 (L) 0.8 - 2.0 ng/mL    Comment: RESULTS CONFIRMED BY MANUAL DILUTION  Protime-INR     Status: Abnormal   Collection Time: 07/13/17  7:34 AM  Result Value Ref Range   Prothrombin Time 31.0 (H) 11.4 - 15.2 seconds   INR 3.01   CBC     Status: Abnormal   Collection Time: 07/13/17  7:34 AM  Result Value Ref Range   WBC 11.1 (H) 4.0 - 10.5 K/uL   RBC 5.03 4.22 - 5.81 MIL/uL   Hemoglobin 15.5 13.0 - 17.0 g/dL   HCT 46.8 39.0 - 52.0 %   MCV 93.0 78.0 - 100.0 fL   MCH 30.8 26.0 - 34.0 pg   MCHC 33.1 30.0 - 36.0 g/dL   RDW 13.6 11.5 - 15.5 %   Platelets 278 150 - 400 K/uL  Basic metabolic panel     Status: Abnormal   Collection Time: 07/13/17  7:34 AM  Result Value Ref Range   Sodium 139 135 - 145 mmol/L   Potassium 4.0 3.5 - 5.1 mmol/L   Chloride 105 101 - 111 mmol/L   CO2 25 22 - 32 mmol/L   Glucose, Bld 120 (H) 65 - 99 mg/dL   BUN 14 6 - 20 mg/dL   Creatinine, Ser 0.91 0.61 - 1.24 mg/dL   Calcium 9.3 8.9 - 10.3 mg/dL   GFR calc non Af Amer >60 >60 mL/min   GFR calc Af Amer >60 >60 mL/min    Comment: (NOTE) The eGFR has been calculated using the CKD EPI equation. This calculation has not  been validated in all clinical situations. eGFR's persistently <60 mL/min signify possible Chronic Kidney Disease.    Anion gap 9 5 - 15     HEENT: normal Cardio: RRR without murmur. No JVD  Resp: CTA Bilaterally without wheezes or rales. Normal effort  GI: BS positive and NT, ND Extremity:  Pulses positive and No Edema Skin:   Intact and Other IV site ok Neuro: Alert/Oriented, Normal Sensory, Abnormal Motor 3- to 3+ left delt bi tri FF, 3+ L HF, KE 0 ADF and Abnormal FMC, no visual field def Ataxic/ dec FMC Musc/Skel:  Normal Gen NAD   Assessment/Plan: 1. 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 Physiatrist is providing close team supervision and 24 hour management of active medical problems listed below. Physiatrist and rehab team continue to assess barriers to discharge/monitor patient progress toward functional and medical goals. FIM: Function - Bathing Bathing activity did not occur: Refused Position:  Shower Body parts bathed by patient: Left arm, Chest, Abdomen, Front perineal area, Buttocks, Left upper leg, Right upper leg Body parts bathed by helper: Left lower leg, Right lower leg, Right arm, Back Assist Level: Touching or steadying assistance(Pt > 75%)  Function- Upper Body Dressing/Undressing What is the patient wearing?: Pull over shirt/dress Pull over shirt/dress - Perfomed by patient: Thread/unthread right sleeve, Put head through opening Pull over shirt/dress - Perfomed by helper: Thread/unthread left sleeve Assist Level: Touching or steadying assistance(Pt > 75%) Function - Lower Body Dressing/Undressing What is the patient wearing?: Socks, Shoes, Underwear, Pants Position: Wheelchair/chair at sink Underwear - Performed by patient: Thread/unthread right underwear leg Underwear - Performed by helper: Thread/unthread left underwear  leg, Pull underwear up/down Pants- Performed by patient: Thread/unthread right pants leg Pants- Performed by helper: Pull pants up/down, Thread/unthread left pants leg Non-skid slipper socks- Performed by helper: Don/doff right sock, Don/doff left sock Socks - Performed by helper: Don/doff right sock, Don/doff left sock Shoes - Performed by helper: Don/doff right shoe, Don/doff left shoe, Fasten right, Fasten left Assist for footwear: Maximal assist Assist for lower body dressing: Touching or steadying assistance (Pt > 75%)  Function - Toileting Toileting steps completed by patient: Performs perineal hygiene Toileting steps completed by helper: Adjust clothing prior to toileting, Performs perineal hygiene, Adjust clothing after toileting Toileting Assistive Devices: Grab bar or rail Assist level: Touching or steadying assistance (Pt.75%)  Function - Air cabin crew transfer activity did not occur: N/A Toilet transfer assistive device: Grab bar Assist level to toilet: Touching or steadying assistance (Pt > 75%) Assist level from toilet: Touching or steadying assistance (Pt > 75%)  Function - Chair/bed transfer Chair/bed transfer activity did not occur: N/A Chair/bed transfer method: Stand pivot Chair/bed transfer assist level: Touching or steadying assistance (Pt > 75%) Chair/bed transfer assistive device: Walker, Armrests, Orthosis Chair/bed transfer details: Verbal cues for precautions/safety, Verbal cues for safe use of DME/AE  Function - Locomotion: Wheelchair Will patient use wheelchair at discharge?: Yes Type: Manual Max wheelchair distance: 183f Assist Level: Touching or steadying assistance (Pt > 75%) Assist Level: Touching or steadying assistance (Pt > 75%) Wheel 150 feet activity did not occur: Safety/medical concerns Function - Locomotion: Ambulation Assistive device: Walker-rolling, Orthosis Max distance: 150' Assist level: Touching or steadying assistance  (Pt > 75%) Assist level: Touching or steadying assistance (Pt > 75%) Walk 50 feet with 2 turns activity did not occur: Safety/medical concerns Assist level: Touching or steadying assistance (Pt > 75%) Walk 150 feet activity did not occur: Safety/medical concerns Assist level: Touching or steadying assistance (Pt > 75%) Walk 10 feet on uneven surfaces activity did not occur: Safety/medical concerns  Function - Comprehension Comprehension: Auditory Comprehension assist level: Follows complex conversation/direction with no assist  Function - Expression Expression: Verbal Expression assist level: Expresses complex ideas: With no assist  Function - Social Interaction Social Interaction assist level: Interacts appropriately with others - No medications needed.  Function - Problem Solving Problem solving assist level: Solves complex problems: Recognizes & self-corrects  Function - Memory Memory assist level: More than reasonable amount of time Patient normally able to recall (first 3 days only): Current season, Location of own room, Staff names and faces, That he or she is in a hospital  Medical Problem List and Plan:  1. 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 DVT Prophylaxis/Anticoagulation: Pharmaceutical: INR therapeutic, off heparin  3. Pain Management: tylenol prn  4. Mood: LCSW to follow for evaluation and support.  Will ask neuropsychology to-follow-up with the patient as he is expressing signs and symptoms of depression  5. Neuropsych: This patient is capable of making decisions on his own behalf.  6. Skin/Wound Care: Routine pressure relief measures.  7. Fluids/Electrolytes/Nutrition: Monitor I/O. Lytes normal 12/12,  8. Severe CAD with CM: Treated medically with ASA, statin , no angina,  9. Acute systolic CHF: Was started spironolactone and digoxin on  12/7. BP soft , Cardiology to follow, avoid hypotension/hypoperfusion. No clinical signs of failure 12/15     Filed Weights   07/06/17 1716  Weight: 86.3 kg (190 lb 4.8 oz)    -need to follow weights  Vitals:   07/13/17 0605 07/14/17 0700  BP: 126/74 114/65  Pulse: 78 71  Resp: 17 18  Temp: 98 F (36.7 C) 98 F (36.7 C)  SpO2: 97% 96%   10.  Apical thrombus cont warfarin, INR 3.01 today 11. Prediabetes: Hgb A1C- 6.0. Will have dietitian educate patient on CM diet--monitor BS for trends.   12. Polysubstance abuse: Has been counseled on importance of alcohol, cocaine and THC cessation-appreciate Neuropsych consult   -He understands that he needs to make some changes in his health hygiene and habits    LOS (Days) 8 A FACE TO FACE EVALUATION WAS PERFORMED  SWARTZ,ZACHARY T 07/14/2017, 8:16 AM

## 2017-07-14 NOTE — Progress Notes (Signed)
Occupational Therapy Session Note  Patient Details  Name: Angel Davies MRN: [id number] Date of Birth: Nov 08, 1970  Today's Date: 07/14/2017 OT Individual Time: 1445-1530 OT Individual Time Calculation (min): 45 min    Short Term Goals: Week 2:  OT Short Term Goal 1 (Week 2): Pt will transfer to toilet/BSC with supervision and LRAD OT Short Term Goal 2 (Week 2): Pt will demonstrate proficiency in L UE self ROM OT Short Term Goal 3 (Week 2): Pt ambulate in room with LRAD  to collect clothing for BADL tasks with Min A  Skilled Therapeutic Interventions/Progress Updates:    OT treatment session focused on  Functional ambulation, L UE NMR, core strength, and hand there-ex. Pt ambulated to therapy gym with min A and intermittent mod A for lateral LOB. Pt placed un UE ranger and worked on elbow/shoulder ROM. Addressed finger isolation with finger tracing activity on mirror. Pt then brought into quadruped position for card matching activity. Pt able to stabilize L UE from shoulder with min guard A. Pt ambulated back to room and left seated EOB with needs met.   Therapy Documentation Precautions:  Precautions Precautions: Fall Restrictions Weight Bearing Restrictions: No  See Function Navigator for Current Functional Status.   Therapy/Group: Individual Therapy  [name] 07/14/2017, 3:11 PM

## 2017-07-14 NOTE — Progress Notes (Signed)
Social Work Assessment and Plan  Patient Details  Name: Angel Davies MRN: [id number] Date of Birth: 20-Mar-1971  Today's Date: 07/11/2017  Problem List:  Patient Active Problem List   Diagnosis Date Noted  . Right pontine stroke ([place]) 07/06/2017  . Left hemiparesis ([place])   . Cardiomyopathy, ischemic   . LV (left ventricular) mural thrombus following MI ([place])   . Acute systolic heart failure ([place])   . Myocardiopathy ([place])   . Mixed hyperlipidemia   . CVA (cerebral vascular accident) ([place]) 07/02/2017  . Alcohol use 07/02/2017  . Anxiety 07/02/2017  . Depression 07/02/2017  . Acute hyperglycemia 07/02/2017  . Elevated blood-pressure reading without diagnosis of hypertension 07/02/2017  . ADHD 07/02/2017  . Cocaine abuse ([place]) 07/02/2017  . Marijuana abuse 06/20/2017  . Cocaine use 06/20/2017   Past Medical History:  Past Medical History:  Diagnosis Date  . ADHD (attention deficit hyperactivity disorder)   . Anxiety   . CVA (cerebral vascular accident) ([place]) 07/02/2017   "left sided weakness" (07/02/2017)  . Depression   . History of kidney stones    Past Surgical History:  Past Surgical History:  Procedure Laterality Date  . CYSTOSCOPY WITH URETEROSCOPY, STONE BASKETRY AND STENT PLACEMENT    . RIGHT/LEFT HEART CATH AND CORONARY ANGIOGRAPHY N/A 07/05/2017   Procedure: RIGHT/LEFT HEART CATH AND CORONARY ANGIOGRAPHY;  Surgeon: Jolaine Artist, MD;  Location: [facility];  Service: Cardiovascular;  Laterality: N/A;   Social History:  reports that he has been smoking cigarettes.  He has a 30.00 pack-year smoking history. he has never used smokeless tobacco. He reports that he drinks about 2.4 oz of alcohol per week. He reports that he uses drugs. Drugs: Marijuana and "Crack" cocaine.  Family / Support Systems Marital Status: Married Patient Roles: Spouse, Building control surveyor, Other (Comment)(son; brother; employee) Spouse/Significant Other: Nahshon Reich - wife - [phone] Anticipated Caregiver: wife Ability/Limitations of Caregiver: wife to take FMLA as needed; pt's dad's nurse will also assist pt at home Caregiver Availability: 24/7 Family Dynamics: supportive wife  Social History Preferred language: English Religion: Catholic Education: college Read: Yes Write: Yes Employment Status: Employed Return to Work Plans: Pt wants to return to work as soon as he is able. Legal History/Current Legal Issues: none reported Guardian/Conservator: N/A - MD has determined that pt is capable of making his own decisions.   Abuse/Neglect Abuse/Neglect Assessment Can Be Completed: Yes Physical Abuse: Denies Verbal Abuse: Denies Sexual Abuse: Denies Exploitation of patient/patient's resources: Denies Self-Neglect: Denies  Emotional Status Pt's affect, behavior and adjustment status: Pt reports being up and down emotionally, but was able to share with CSW his thoughts and feelings about his stroke, his father, and the pressure he feels as everyone is counting on him. Recent Psychosocial Issues: Pt is the caregiver for his father and does not have much support from his siblings.  Wife is very supportive of them caring for father. Psychiatric History: Pt reported experiencing anxiety and depression in the past and also has been diagnoses with ADHD. Substance Abuse History: Pt also admitted to polysubstance abuse - alcohol, marijuana, and most recently cocaine, although he states that is not ongoing, but something he did to celebrate his birthday.  Patient / Family Perceptions, Expectations & Goals Pt/Family understanding of illness & functional limitations: Pf/wife have a good understanding of pt's condition and limitations. 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  Pt also cares for his  father. Anticipated changes in roles/activities/participation: Pt would like to be able to resume the above activities and feels a lot of pressure to do so quickly. Pt/family expectations/goals: Pt wants to "walk out of rehab" on his own.  Community Resources Express Scripts: None Premorbid Home Care/DME Agencies: None Transportation available at discharge: wife Resource referrals recommended: Neuropsychology, Support group (specify)  Discharge Planning Living Arrangements: Spouse/significant other, Parent Support Systems: Spouse/significant other, Parent, Other relatives, Friends/neighbors Type of Residence: Private residence Insurance Resources: Multimedia programmer (specify)([employer]) Museum/gallery curator Resources: Employment, Secondary school teacher Screen Referred: No Money Management: Patient, Spouse Does the patient have any problems obtaining your medications?: No Home Management: Pt's wife can do this while pt recovers. Patient/Family Preliminary Plans: Pt plans to return to his home where either his wife or his father's nurse will be with him to provide 24/7 supervision. [place] Barriers to Discharge: Inaccessible home environment Social Work Anticipated Follow Up Needs: HH/OP, Support Group Expected length of stay: 3 weels  Clinical Impression CSW met with pt to introduce self and role of CSW, as well as to complete assessment.  Pt was very open with CSW about his emotional response to the stroke and polysubstance abuse.  CSW talked with pt about making better choices given this stroke and he reports he does not need resources for this, however CSW plans to continue to offer support and resources.  Pt was appreciative of CSW listening and allowing him to talk.  CSW explained that neuropsychologist would also be coming to see pt to offer support, as well.  Pt has a good sense of humor and although he admits he is feeling down at times, he is able to use his humor to pull himself up.  Pt gave CSW  permission to call his wife to introduce self to her.  CSW talked with her via telephone and she confirmed that she is committed to helping pt get better.  Pt is very motivated to get well and wants to stay on CIR as long as therapists and MD feel he needs to be there, even if he needs to stay over Christmas.  CSW will continue to follow and assist as needed.  Kenji Mapel, Silvestre Mesi 07/11/2017, 12:12 AM

## 2017-07-15 ENCOUNTER — Inpatient Hospital Stay (HOSPITAL_COMMUNITY): Payer: Managed Care, Other (non HMO) | Admitting: Occupational Therapy

## 2017-07-15 LAB — PROTIME-INR
INR: 3.38
Prothrombin Time: 33.9 seconds — ABNORMAL HIGH (ref 11.4–15.2)

## 2017-07-15 MED ORDER — WARFARIN SODIUM 2.5 MG PO TABS
2.5000 mg | ORAL_TABLET | Freq: Once | ORAL | Status: AC
  Administered 2017-07-15: 2.5 mg via ORAL
  Filled 2017-07-15: qty 1

## 2017-07-15 NOTE — Progress Notes (Signed)
Occupational Therapy Session Note  Patient Details  Name: Angel Davies MRN: [id number] Date of Birth: [birth year]  Today's Date: 07/15/2017 OT Individual Time: 0920-1000 OT Individual Time Calculation (min): 40 min    Short Term Goals: Week 1:  OT Short Term Goal 1 (Week 1): Pt will stand at sink to groom 2/2 items wiht supervision OT Short Term Goal 1 - Progress (Week 1): Met OT Short Term Goal 2 (Week 1): Pt will don shirt with supervision and no more than 1 cue for hemi techniques OT Short Term Goal 2 - Progress (Week 1): Met OT Short Term Goal 3 (Week 1): Pt will fasten pants to improve [place] wiht up to min A OT Short Term Goal 3 - Progress (Week 1): Met OT Short Term Goal 4 (Week 1): Pt will transfer to toilet/BSC with supervision and LRAD OT Short Term Goal 4 - Progress (Week 1): Progressing toward goal OT Short Term Goal 5 (Week 1): Pt will bathe 50% of body with LUE and AE PRN OT Short Term Goal 5 - Progress (Week 1): Met  Skilled Therapeutic Interventions/Progress Updates:    1:1. Pt agreeable to exta OT session. Sitting EOB pt dons B socks and shoes with VC for 1 handed sock technique and touching A for pt to maintain seated figure 4 to don L sock/shoe. Pt uses shoe horn to push heel into shoe. Pt ambulates to/from dayroom with quad cane and CGA with VC for cane management and looking forward. Pt picks up small pushpins with significantly increased time 2/2 decreased [place] with LUE to place on target corkboard. Pt able to manipulate 7 pushpins in 10 min with VC for decreasing shoulder hike to reach. Exited session with pt seated in bed with call light in reach and all needs met.  Therapy Documentation Precautions:  Precautions Precautions: Fall Restrictions Weight Bearing Restrictions: No General:  See Function Navigator for Current Functional Status.   Therapy/Group: Individual Therapy  Tonny Branch 07/15/2017, 9:55 AM

## 2017-07-15 NOTE — Progress Notes (Signed)
ANTICOAGULATION CONSULT NOTE - Follow Up Consult  Pharmacy Consult for warfarin Indication: stroke and LV thrombus  No Known Allergies  Patient Measurements: Height: 5\' 9"  (175.3 cm) Weight: 192 lb 3.9 oz (87.2 kg) IBW/kg (Calculated) : 70.7  Vital Signs: Temp: 98 F (36.7 C) (12/16 0613) Temp Source: Oral (12/16 0613) BP: 120/72 (12/16 0613) Pulse Rate: 77 (12/16 0613)  Labs: Recent Labs    07/13/17 0734 07/14/17 0711 07/15/17 1058  HGB 15.5  --   --   HCT 46.8  --   --   PLT 278  --   --   LABPROT 31.0* 33.9* 33.9*  INR 3.01 3.38 3.38  CREATININE 0.91  --   --     Estimated Creatinine Clearance: 108.5 mL/min (by C-G formula based on SCr of 0.91 mg/dL).  Assessment: 46 y/o M s/p acute embolic stroke, also found to have LV thrombus. s/p heparin-stopped 12/9. Warfarin started 12/7. Patient seems to be very sensitive to warfarin, possibly d/t fenofibrate. Warfarin was held 12/9 and 12/10 d/t rapidly increasing INR.   INR supratherapeutic at 3.38 today, still likely seeing effects of 5mg  and 4mg  doses and anticipate a drop now that the dose has been lowered the past two days.  No new CBC, but has been stable, pt ambulating and no reports of bleeding today.    Goal of Therapy:  INR goal 2-3 Monitor platelets by anticoagulation protocol: Yes   Plan:  Repeat warfarin 2.5mg  x 1 today Monitor daily INR, s/sx of bleed, PO intake, DDI Weekly CBC  Daylene PoseyJonathan Helmuth Recupero, PharmD Pharmacy Resident Pager #: [phone] 07/15/2017 12:28 PM

## 2017-07-15 NOTE — Progress Notes (Signed)
Occupational Therapy Session Note  Patient Details  Name: Angel Davies MRN: [id number] Date of Birth: 04/25/1971  Today's Date: 07/15/2017 OT Individual Time: 7829-56211416-1504 OT Individual Time Calculation (min): 48 min    Short Term Goals: Week 2:  OT Short Term Goal 1 (Week 2): Pt will transfer to toilet/BSC with supervision and LRAD OT Short Term Goal 2 (Week 2): Pt will demonstrate proficiency in L UE self ROM OT Short Term Goal 3 (Week 2): Pt ambulate in room with LRAD  to collect clothing for BADL tasks with Min A  Skilled Therapeutic Interventions/Progress Updates:    Tx focus on Lt NMR, functional ambulation with device, balance, and adaptive bathing/dressing skills.  Pt greeted supine in bed with family present. Eager to participate in therapy. He ambulated with quad cane and close supervision to TTB. Pt bathing at sit<stand level with close supervision. He used L UE to thoroughly wash Rt arm and to lather hair with shampoo. Able to integrate bilateral UEs to dry back with towel. He opted to dress while on TTB. With extra time, pt was able to achieve figure 4 with both legs for donning LB garments and correctly orienting clothing. Cues for hooking thumb into pants provided to assist with advancing pants on Lt side. He interchanged UE support on grab bars for balance maintenance. 1 LOB when he removed bilateral UE support. Mod A to safely lower back onto bench. After he donned footwear, pt ambulated with quad cane back to EOB. Educated him on additional NMR strategies he could implement in room to further functional gains. He was appreciative of education. Pt left EOB with all needs within reach at session exit.   Therapy Documentation Precautions:  Precautions Precautions: Fall Restrictions Weight Bearing Restrictions: No Vital Signs: Therapy Vitals Temp: 98.5 F (36.9 C) Temp Source: Oral Pulse Rate: 80 Resp: 18 BP: 110/69 Patient Position (if appropriate): Lying Oxygen  Therapy SpO2: 94 % O2 Device: Not Delivered Pain: No c/o pain during tx    ADL:  :    See Function Navigator for Current Functional Status.   Therapy/Group: Individual Therapy  [name] A [name] 07/15/2017, 4:10 PM

## 2017-07-15 NOTE — Progress Notes (Signed)
Subjective/Complaints:  No new issues.  Went to see family member who was ill in hospital  ROS: pt denies nausea, vomiting, diarrhea, cough, shortness of breath or chest pain   Objective: Vital Signs: Blood pressure 120/72, pulse 77, temperature 98 F (36.7 C), temperature source Oral, resp. rate 18, height 5' 9" (1.753 m), weight 87.2 kg (192 lb 3.9 oz), SpO2 96 %. No results found. Results for orders placed or performed during the hospital encounter of 07/06/17 (from the past 72 hour(s))  Protime-INR     Status: Abnormal   Collection Time: 07/13/17  7:34 AM  Result Value Ref Range   Prothrombin Time 31.0 (H) 11.4 - 15.2 seconds   INR 3.01   CBC     Status: Abnormal   Collection Time: 07/13/17  7:34 AM  Result Value Ref Range   WBC 11.1 (H) 4.0 - 10.5 K/uL   RBC 5.03 4.22 - 5.81 MIL/uL   Hemoglobin 15.5 13.0 - 17.0 g/dL   HCT 46.8 39.0 - 52.0 %   MCV 93.0 78.0 - 100.0 fL   MCH 30.8 26.0 - 34.0 pg   MCHC 33.1 30.0 - 36.0 g/dL   RDW 13.6 11.5 - 15.5 %   Platelets 278 150 - 400 K/uL  Basic metabolic panel     Status: Abnormal   Collection Time: 07/13/17  7:34 AM  Result Value Ref Range   Sodium 139 135 - 145 mmol/L   Potassium 4.0 3.5 - 5.1 mmol/L   Chloride 105 101 - 111 mmol/L   CO2 25 22 - 32 mmol/L   Glucose, Bld 120 (H) 65 - 99 mg/dL   BUN 14 6 - 20 mg/dL   Creatinine, Ser 0.91 0.61 - 1.24 mg/dL   Calcium 9.3 8.9 - 10.3 mg/dL   GFR calc non Af Amer >60 >60 mL/min   GFR calc Af Amer >60 >60 mL/min    Comment: (NOTE) The eGFR has been calculated using the CKD EPI equation. This calculation has not been validated in all clinical situations. eGFR's persistently <60 mL/min signify possible Chronic Kidney Disease.    Anion gap 9 5 - 15  Protime-INR     Status: Abnormal   Collection Time: 07/14/17  7:11 AM  Result Value Ref Range   Prothrombin Time 33.9 (H) 11.4 - 15.2 seconds   INR 3.38      HEENT: normal Cardio: RRR without murmur. No JVD  Resp: CTA  Bilaterally without wheezes or rales. Normal effort  GI: BS positive and NT, ND Extremity:  Pulses positive and No Edema Skin:   Intact and Other IV site ok Neuro: Alert/Oriented, Normal Sensory, Abnormal Motor 3- to 3+ left delt bi tri FF, 3+ L HF, KE 0 ADF and Abnormal FMC, no visual field def Ataxic/ dec FMC Musc/Skel:  Normal Gen NAD   Assessment/Plan: 1. 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 Physiatrist is providing close team supervision and 24 hour management of active medical problems listed below. Physiatrist and rehab team continue to assess barriers to discharge/monitor patient progress toward functional and medical goals. FIM: Function - Bathing Bathing activity did not occur: Refused Position: Shower Body parts bathed by patient: Left arm, Chest, Abdomen, Front perineal area, Buttocks, Left upper leg, Right upper leg Body parts bathed by helper: Left lower leg, Right lower leg, Right arm, Back Assist Level: Touching or steadying assistance(Pt > 75%)    Function- Upper Body Dressing/Undressing What is the patient wearing?: Pull over shirt/dress Pull over shirt/dress - Perfomed by patient: Thread/unthread right sleeve, Put head through opening Pull over shirt/dress - Perfomed by helper: Thread/unthread left sleeve Assist Level: Touching or steadying assistance(Pt > 75%) Function - Lower Body Dressing/Undressing What is the patient wearing?: Socks, Shoes, Underwear, Pants Position: Wheelchair/chair at sink Underwear - Performed by patient: Thread/unthread right underwear leg Underwear - Performed by helper: Thread/unthread left underwear leg, Pull underwear up/down Pants- Performed by patient: Thread/unthread right pants leg Pants- Performed by helper: Pull pants up/down, Thread/unthread left pants leg Non-skid slipper socks- Performed by  helper: Don/doff right sock, Don/doff left sock Socks - Performed by helper: Don/doff right sock, Don/doff left sock Shoes - Performed by helper: Don/doff right shoe, Don/doff left shoe, Fasten right, Fasten left Assist for footwear: Maximal assist Assist for lower body dressing: Touching or steadying assistance (Pt > 75%)  Function - Toileting Toileting activity did not occur: (no BM, used urinal) Toileting steps completed by patient: Performs perineal hygiene Toileting steps completed by helper: Adjust clothing prior to toileting, Performs perineal hygiene, Adjust clothing after toileting Toileting Assistive Devices: Grab bar or rail Assist level: Touching or steadying assistance (Pt.75%)  Function - Toilet Transfers Toilet transfer activity did not occur: N/A Toilet transfer assistive device: Grab bar Assist level to toilet: Touching or steadying assistance (Pt > 75%) Assist level from toilet: Touching or steadying assistance (Pt > 75%)  Function - Chair/bed transfer Chair/bed transfer activity did not occur: N/A Chair/bed transfer method: Stand pivot Chair/bed transfer assist level: Touching or steadying assistance (Pt > 75%) Chair/bed transfer assistive device: Walker, Armrests, Orthosis Chair/bed transfer details: Verbal cues for precautions/safety, Verbal cues for safe use of DME/AE  Function - Locomotion: Wheelchair Will patient use wheelchair at discharge?: Yes Type: Manual Max wheelchair distance: 100ft Assist Level: Touching or steadying assistance (Pt > 75%) Assist Level: Touching or steadying assistance (Pt > 75%) Wheel 150 feet activity did not occur: Safety/medical concerns Function - Locomotion: Ambulation Assistive device: Walker-rolling, Orthosis Max distance: 150' Assist level: Touching or steadying assistance (Pt > 75%) Assist level: Touching or steadying assistance (Pt > 75%) Walk 50 feet with 2 turns activity did not occur: Safety/medical concerns Assist  level: Touching or steadying assistance (Pt > 75%) Walk 150 feet activity did not occur: Safety/medical concerns Assist level: Touching or steadying assistance (Pt > 75%) Walk 10 feet on uneven surfaces activity did not occur: Safety/medical concerns  Function - Comprehension Comprehension: Auditory Comprehension assist level: Follows complex conversation/direction with no assist  Function - Expression Expression: Verbal Expression assist level: Expresses complex ideas: With no assist  Function - Social Interaction Social Interaction assist level: Interacts appropriately with others - No medications needed.  Function - Problem Solving Problem solving assist level: Solves complex problems: Recognizes & self-corrects  Function - Memory Memory assist level: More than reasonable amount of time Patient normally able to recall (first 3 days only): Current season, Location of own room, Staff names and faces, That he or she is in a hospital  Medical Problem List and Plan:  1. 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 DVT Prophylaxis/Anticoagulation: Pharmaceutical: INR supratherapeutic, off heparin 3. Pain Management: tylenol prn  4. Mood: LCSW to follow for evaluation and support.  Will ask neuropsychology to-follow-up with the patient as he is expressing signs and symptoms of depression  5.   Neuropsych: This patient is capable of making decisions on his own behalf.  6. Skin/Wound Care: Routine pressure relief measures.  7. Fluids/Electrolytes/Nutrition: Monitor I/O. Lytes normal 12/12,  8. Severe CAD with CM: Treated medically with ASA, statin , no angina,  9. Acute systolic CHF: Was started spironolactone and digoxin on 12/7. BP soft , Cardiology to follow, avoid hypotension/hypoperfusion. No clinical signs of failure 12/16     [facility] Weights   07/06/17 1716  07/15/17 0613  Weight: 86.3 kg (190 lb 4.8 oz) 87.2 kg (192 lb 3.9 oz)    -need to follow weights  Vitals:   07/14/17 1542 07/15/17 0613  BP: 97/64 120/72  Pulse: 92 77  Resp: 18 18  Temp: 98.2 F (36.8 C) 98 F (36.7 C)  SpO2: 97% 96%   10.  Apical thrombus cont warfarin, INR 3.38 today 11. Prediabetes: Hgb A1C- 6.0. Will have dietitian educate patient on CM diet--monitor BS for trends.   12. Polysubstance abuse: Has been counseled on importance of alcohol, cocaine and THC cessation-appreciate Neuropsych consult   -He understands that he needs to make some changes in his health hygiene and habits    LOS (Days) 9 A FACE TO FACE EVALUATION WAS PERFORMED  Arshia Spellman T 07/15/2017, 8:03 AM

## 2017-07-16 ENCOUNTER — Inpatient Hospital Stay (HOSPITAL_COMMUNITY): Payer: Managed Care, Other (non HMO) | Admitting: Occupational Therapy

## 2017-07-16 ENCOUNTER — Inpatient Hospital Stay (HOSPITAL_COMMUNITY): Payer: Managed Care, Other (non HMO)

## 2017-07-16 ENCOUNTER — Inpatient Hospital Stay (HOSPITAL_COMMUNITY): Payer: Managed Care, Other (non HMO) | Admitting: Physical Therapy

## 2017-07-16 DIAGNOSIS — R7303 Prediabetes: Secondary | ICD-10-CM

## 2017-07-16 DIAGNOSIS — I2510 Atherosclerotic heart disease of native coronary artery without angina pectoris: Secondary | ICD-10-CM

## 2017-07-16 DIAGNOSIS — G8190 Hemiplegia, unspecified affecting unspecified side: Secondary | ICD-10-CM

## 2017-07-16 DIAGNOSIS — I2589 Other forms of chronic ischemic heart disease: Secondary | ICD-10-CM

## 2017-07-16 DIAGNOSIS — R7309 Other abnormal glucose: Secondary | ICD-10-CM

## 2017-07-16 DIAGNOSIS — I238 Other current complications following acute myocardial infarction: Secondary | ICD-10-CM

## 2017-07-16 DIAGNOSIS — I2129 ST elevation (STEMI) myocardial infarction involving other sites: Secondary | ICD-10-CM

## 2017-07-16 DIAGNOSIS — K649 Unspecified hemorrhoids: Secondary | ICD-10-CM

## 2017-07-16 DIAGNOSIS — D72829 Elevated white blood cell count, unspecified: Secondary | ICD-10-CM

## 2017-07-16 LAB — PROTIME-INR
INR: 3.2
Prothrombin Time: 32.5 seconds — ABNORMAL HIGH (ref 11.4–15.2)

## 2017-07-16 MED ORDER — WARFARIN SODIUM 2 MG PO TABS
2.0000 mg | ORAL_TABLET | Freq: Once | ORAL | Status: AC
  Administered 2017-07-16: 2 mg via ORAL
  Filled 2017-07-16: qty 1

## 2017-07-16 MED ORDER — HYDROCORTISONE ACETATE 25 MG RE SUPP
25.0000 mg | Freq: Two times a day (BID) | RECTAL | Status: DC
  Administered 2017-07-16 – 2017-07-18 (×3): 25 mg via RECTAL
  Filled 2017-07-16 (×6): qty 1

## 2017-07-16 NOTE — Progress Notes (Signed)
ANTICOAGULATION CONSULT NOTE - Follow Up Consult  Pharmacy Consult for warfarin Indication: stroke and LV thrombus  No Known Allergies  Patient Measurements: Height: 5\' 9"  (175.3 cm) Weight: 195 lb 5.2 oz (88.6 kg) IBW/kg (Calculated) : 70.7  Vital Signs: Temp: 98.4 F (36.9 C) (12/17 0550) Temp Source: Oral (12/17 0550) BP: 107/66 (12/17 0550) Pulse Rate: 88 (12/17 0550)  Labs: Recent Labs    07/14/17 0711 07/15/17 1058 07/16/17 0631  LABPROT 33.9* 33.9* 32.5*  INR 3.38 3.38 3.20    Estimated Creatinine Clearance: 109.4 mL/min (by C-G formula based on SCr of 0.91 mg/dL).  Assessment: 46 y/o M s/p acute embolic stroke, also found to have LV thrombus. s/p heparin-stopped 12/9. Warfarin started 12/7. Patient seems to be very sensitive to warfarin, possibly d/t fenofibrate. Warfarin was held 12/9 and 12/10 d/t rapidly increasing INR.   INR supratherapeutic at 3.2 today. No new CBC, but has been stable, pt ambulating and no reports of bleeding today.    Goal of Therapy:  INR goal 2-3 Monitor platelets by anticoagulation protocol: Yes   Plan:  Repeat warfarin 2 mg x 1 today Monitor daily INR, s/sx of bleed, PO intake, DDI Weekly CBC  Bayard HuggerMei Zeno Hickel, PharmD, BCPS  Clinical Pharmacist  Pager: [phone]   07/16/2017 11:54 AM

## 2017-07-16 NOTE — Progress Notes (Signed)
Occupational Therapy Session Note  Patient Details  Name: Angel CooperJames D Davies MRN: [id number] Date of Birth: 11/01/1970  Today's Date: 07/16/2017 OT Individual Time: 1403-1450 OT Individual Time Calculation (min): 47 min   Short Term Goals: Week 2:  OT Short Term Goal 1 (Week 2): Pt will transfer to toilet/BSC with supervision and LRAD OT Short Term Goal 2 (Week 2): Pt will demonstrate proficiency in L UE self ROM OT Short Term Goal 3 (Week 2): Pt ambulate in room with LRAD  to collect clothing for BADL tasks with Min A  Skilled Therapeutic Interventions/Progress Updates:    Tx focus on Lt NMR and balance during occupation-based task.   Pt greeted EOB, ready to go. He ambulated short distance with quad cane and close supervision to w/c. Pt escorted to therapy apartment. 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 Focus on precise pouring with Lt hand, stabilizing cups with Rt. Pt elevating cups into cupboard to assess evenness of pouring (per task demand of bartending). Pt with decreased smoothness and precision of movements. He cleaned spills with wash cloth using Lt (occurring 25% of time). Throughout session, pt receptive to cues regarding minimizing shoulder hike, slowing down/concentrating on quality of movements and minimizing Rt compensation. Once back in room, [facility] activities he could complete with Lt hand using basic household items. Worked on flipping pennies with pt unable to lift coins without sliding them off of table. Pt left in w/c with all needs within reach at time of departure.   Therapy Documentation Precautions:  Precautions Precautions: Fall Restrictions Weight Bearing Restrictions: No Vital Signs: Therapy Vitals Temp: 98.6 F (37 C) Temp Source: Oral Pulse Rate: 92 Resp: 16 BP: 111/64 Patient Position (if appropriate): Sitting Oxygen Therapy SpO2: 97 % O2 Device: Not Delivered Pain: No c/o pain during tx   ADL:      See Function Navigator for Current Functional Status.   Therapy/Group: Individual Therapy  Alie Hardgrove A Aarian Cleaver 07/16/2017, 3:14 PM

## 2017-07-16 NOTE — Progress Notes (Signed)
Occupational Therapy Session Note  Patient Details  Name: Angel CooperJames D Garza MRN: [id number] Date of Birth: 05/10/1971  Today's Date: 07/16/2017 OT Individual Time: 1115-1200 OT Individual Time Calculation (min): 45 min    Short Term Goals: Week 2:  OT Short Term Goal 1 (Week 2): Pt will transfer to toilet/BSC with supervision and LRAD OT Short Term Goal 2 (Week 2): Pt will demonstrate proficiency in L UE self ROM OT Short Term Goal 3 (Week 2): Pt ambulate in room with LRAD  to collect clothing for BADL tasks with Min A  Skilled Therapeutic Interventions/Progress Updates:    Upon entering the room, pt transitioning from Pt session without difficulty. Pt propelled wheelchair to ADL apartment with supervision. Skilled OT intervention with focus on functional transfers, hand placement, and kitchen mobility. Pt transferring onto low sofa,similar to home environment, with steady assistance and use of quad cane with min cues for proper technique. Pt ambulating on carpeted surface with steady assistance and transferring onto TTB inside of tub shower with close supervision. OT recommended use of safety treads to reduce fall risk at home. OT also educated pt on set up of kitchen for safety and how to safely obtain needed items from wheelchair level and while standing with quad cane. Pt returned demonstrations with steady assistance for balance. Pt ambulating back to room with overall steady assistance and returned to sit on EOB for lunch. Call bell and all needed items within reach upon exiting the room.   Therapy Documentation Precautions:  Precautions Precautions: Fall Restrictions Weight Bearing Restrictions: No General:   Vital Signs:   Pain: Pain Assessment Pain Assessment: No/denies pain  See Function Navigator for Current Functional Status.   Therapy/Group: Individual Therapy  Alen BleacherBradsher, Asahd Can P 07/16/2017, 12:44 PM

## 2017-07-16 NOTE — Progress Notes (Addendum)
Physical Therapy Note  Patient Details  Name: Angel CooperJames D Weed MRN: [id number] Date of Birth: 08/29/1970 Today's Date: 07/16/2017  [id number], 45 min individual tx Pain: none per pt  W/c propulsion using hemi technique x 75' over level tile; then requesting to go backwards in order to go faster.  PT recommended avoiding that, and pushed him the remainder.   Multiple transfers stand pivot without L AFO, with min assistance needed for LOB during pivot.  Neuromuscular re-education via forced use, demo, multimodal cues for alternating reciprocal movement x 4 extremities on NuStep at level 5 x 5 minutes. Educated pt on diaphragmatic breathing with good performance. In R side lying, L clam shells for hip abduction activation, focusing on isolating movement, 2 x 10  Therapeutic activities in unsupported sitting to elicit trunk shortening/lengthening/rotating to facilitate trunk righting, and in supported sitting with R foot on floot- kicking a beach ball with R foot.   Pt demonstrated improving L trunk lengthening with practice.   Gait training with grocery cart to improve fluidity of movement, L AFO x 100' with min assist, tactile cues for R hip extension, step lengths, hand placement. L hip weakness evident by rotational "wobble" of LLE during swing phase.    Pt left resting in recliner with all needs within reach, with Katie , OT coming in for next session.  See function navigator for current status.  Kaamil Morefield 07/16/2017, 8:01 AM

## 2017-07-16 NOTE — Progress Notes (Signed)
Physical Therapy Weekly Progress Note  Patient Details  Name: Angel Davies MRN: [id number] Date of Birth: [birth year]  Beginning of progress report period: July 07, 2017 End of progress report period: July 16, 2017  Today's Date: 07/16/2017 PT Individual Time: 9150-5697 PT Individual Time Calculation (min): 45 min   Patient has met 4 of 4 short term goals.  Pt is making excellent progress towards goals.  He is currently ambulating 200' with SBQC and min guard and transferring with supervision.  Therapy sessions focusing on balance and ambulation.    Patient continues to demonstrate the following deficits muscle weakness, impaired timing and sequencing, unbalanced muscle activation and decreased coordination and decreased standing balance, decreased postural control, hemiplegia and decreased balance strategies and therefore will continue to benefit from skilled PT intervention to increase functional independence with mobility.  Patient progressing toward long term goals..  Continue plan of care.  PT Short Term Goals Week 1:  PT Short Term Goal 1 (Week 1): Pt will perform bed mobility with supervision assist PT Short Term Goal 1 - Progress (Week 1): Met PT Short Term Goal 2 (Week 1): Pt will perform trasnfers with supervision assist  PT Short Term Goal 2 - Progress (Week 1): Met PT Short Term Goal 3 (Week 1): Pt will ambulate 171f with min assist  PT Short Term Goal 3 - Progress (Week 1): Met PT Short Term Goal 4 (Week 1): Pt will propell WC 1058fwith supervision assist.  PT Short Term Goal 4 - Progress (Week 1): Discontinued (comment) Week 2:  PT Short Term Goal 1 (Week 2): =LTGs due to ELOS  Skilled Therapeutic Interventions/Progress Updates:    no c/o pain.  Session focus on activity tolerance, balance, and LLE NMR.    Pt transfers throughout session with supervision.  Gait throughout session, max distance 200', with SBQC and min guard for balance.    Biodex limits of  stability training x3 trials on static surface with UE support, progress to limits of stability x3 trials on dynamic surface with UE support and improved accuracy.    NMR for LLE knee control with step ups to 6" step, LLE only 3 trials to fatigue, with min tactile cues at quads/hamstrings.    Pt returned to room at end of session and positioned EOB with call bell in reach and needs met.   Therapy Documentation Precautions:  Precautions Precautions: Fall Restrictions Weight Bearing Restrictions: No   See Function Navigator for Current Functional Status.  Therapy/Group: Individual Therapy  CaMichel Santee2/17/2018, 3:34 PM

## 2017-07-16 NOTE — Progress Notes (Addendum)
Subjective/Complaints: Patient seen lying in bed this morning. He states he slept well overnight. He states he had a good weekend. He is very positive. He does complain of hemorrhoids and asked her medications.  ROS: + Hemorrhoids. Denies nausea, vomiting, diarrhea, shortness of breath or chest pain   Objective: Vital Signs: Blood pressure 107/66, pulse 88, temperature 98.4 F (36.9 C), temperature source Oral, resp. rate 18, height 5\' 9"  (1.753 m), weight 88.6 kg (195 lb 5.2 oz), SpO2 95 %. No results found. Results for orders placed or performed during the hospital encounter of 07/06/17 (from the past 72 hour(s))  Protime-INR     Status: Abnormal   Collection Time: 07/14/17  7:11 AM  Result Value Ref Range   Prothrombin Time 33.9 (H) 11.4 - 15.2 seconds   INR 3.38   Protime-INR     Status: Abnormal   Collection Time: 07/15/17 10:58 AM  Result Value Ref Range   Prothrombin Time 33.9 (H) 11.4 - 15.2 seconds   INR 3.38   Protime-INR     Status: Abnormal   Collection Time: 07/16/17  6:31 AM  Result Value Ref Range   Prothrombin Time 32.5 (H) 11.4 - 15.2 seconds   INR 3.20     Gen NAD. Vital signs reviewed.  HEENT: normal. Atraumatic Cardio: RRR. No JVD  Resp: CTA Bilaterally. Normal effort  GI: BS positive and ND Musc/Skel:  No tenderness. No edema. Neuro: Alert and oriented Motor: LUE: 4-/5 proximal to distal  LLE: HF, KE 4+/5, ADF/PF 1/5  Skin:   Intact. Warm and dry  Assessment/Plan: 1. 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 Physiatrist is providing close team supervision and 24 hour management of active medical problems listed below. Physiatrist and rehab team continue to assess barriers to discharge/monitor patient progress toward functional and medical goals. FIM: Function - Bathing Bathing activity did not occur: Refused Position:  Shower Body parts bathed by patient: Left arm, Chest, Abdomen, Front perineal area, Buttocks, Left upper leg, Right upper leg, Right lower leg, Left lower leg Body parts bathed by helper: Left lower leg, Right lower leg, Right arm, Back Bathing not applicable: Back Assist Level: Supervision or verbal cues  Function- Upper Body Dressing/Undressing What is the patient wearing?: Pull over shirt/dress Pull over shirt/dress - Perfomed by patient: Thread/unthread right sleeve, Put head through opening, Thread/unthread left sleeve, Pull shirt over trunk Pull over shirt/dress - Perfomed by helper: Thread/unthread left sleeve Assist Level: Supervision or verbal cues Function - Lower Body Dressing/Undressing What is the patient wearing?: Shoes, Underwear, Pants Position: Other (comment)(sitting on TTB) Underwear - Performed by patient: Thread/unthread right underwear leg, Thread/unthread left underwear leg, Pull underwear up/down Underwear - Performed by helper: Thread/unthread left underwear leg, Pull underwear up/down Pants- Performed by patient: Thread/unthread right pants leg, Thread/unthread left pants leg, Pull pants up/down Pants- Performed by helper: Pull pants up/down, Thread/unthread left pants leg Non-skid slipper socks- Performed by helper: Don/doff right sock, Don/doff left sock Socks - Performed by helper: Don/doff right sock, Don/doff left sock Shoes - Performed by patient: Don/doff right shoe, Don/doff left shoe Shoes - Performed by helper: Don/doff right shoe, Don/doff left shoe, Fasten right, Fasten left Assist for footwear: Supervision/touching assist Assist for lower body dressing: Supervision or verbal cues  Function - Toileting Toileting activity did not occur: (no BM, used urinal) Toileting steps completed by patient: Performs perineal hygiene Toileting steps  completed by helper: Adjust clothing prior to toileting, Adjust clothing after toileting Toileting Assistive Devices:  Grab bar or rail Assist level: Supervision or verbal cues  Function - Toilet Transfers Toilet transfer activity did not occur: N/A Toilet transfer assistive device: Grab bar Assist level to toilet: Touching or steadying assistance (Pt > 75%) Assist level from toilet: Touching or steadying assistance (Pt > 75%)  Function - Chair/bed transfer Chair/bed transfer activity did not occur: N/A Chair/bed transfer method: Stand pivot Chair/bed transfer assist level: Supervision or verbal cues Chair/bed transfer assistive device: Armrests, Walker, Orthosis Chair/bed transfer details: Verbal cues for precautions/safety, Verbal cues for safe use of DME/AE  Function - Locomotion: Wheelchair Will patient use wheelchair at discharge?: Yes Type: Manual Max wheelchair distance: 170ft Assist Level: Touching or steadying assistance (Pt > 75%) Assist Level: Touching or steadying assistance (Pt > 75%) Wheel 150 feet activity did not occur: Safety/medical concerns Function - Locomotion: Ambulation Assistive device: Walker-rolling, Orthosis Max distance: 150 Assist level: Touching or steadying assistance (Pt > 75%) Assist level: Touching or steadying assistance (Pt > 75%) Walk 50 feet with 2 turns activity did not occur: Safety/medical concerns Assist level: Touching or steadying assistance (Pt > 75%) Walk 150 feet activity did not occur: Safety/medical concerns Assist level: Touching or steadying assistance (Pt > 75%) Walk 10 feet on uneven surfaces activity did not occur: Safety/medical concerns  Function - Comprehension Comprehension: Auditory Comprehension assist level: Follows complex conversation/direction with no assist  Function - Expression Expression: Verbal Expression assist level: Expresses complex ideas: With no assist  Function - Social Interaction Social Interaction assist level: Interacts appropriately with others - No medications needed.  Function - Problem Solving Problem  solving assist level: Solves complex problems: Recognizes & self-corrects  Function - Memory Memory assist level: More than reasonable amount of time Patient normally able to recall (first 3 days only): Current season, Location of own room, Staff names and faces, That he or she is in a hospital  Medical Problem List and Plan:  1. 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 DVT Prophylaxis/Anticoagulation: Pharmaceutical: INR supratherapeutic, off heparin 3. Pain Management: tylenol prn  4. Mood: LCSW to follow for evaluation and support. Neuropsychology follow-up with the patient  5. Neuropsych: This patient is capable of making decisions on his own behalf.  6. Skin/Wound Care: Routine pressure relief measures.  7. Fluids/Electrolytes/Nutrition: Monitor I/O.  8. Severe CAD with CM: Treated medically with ASA, statin , no angina  9. Acute systolic CHF: Was started spironolactone and digoxin on 12/7. Cardiology to follow, avoid hypotension/hypoperfusion. No clinical signs of failure 12/16     [place] Weights   07/06/17 1716 07/15/17 0613 07/16/17 0550  Weight: 86.3 kg (190 lb 4.8 oz) 87.2 kg (192 lb 3.9 oz) 88.6 kg (195 lb 5.2 oz)    ?Trending up  Vitals:   07/15/17 1535 07/16/17 0550  BP: 110/69 107/66  Pulse: 80 88  Resp: 18 18  Temp: 98.5 F (36.9 C) 98.4 F (36.9 C)  SpO2: 94% 95%   10.  Apical thrombus cont warfarin   INR supratherapeutic on 12/17 11. Prediabetes: Hgb A1C- 6.0. Will have dietitian educate patient on CM diet--monitor BS for trends.  12. Polysubstance abuse: Has been counseled on importance of alcohol, cocaine and THC cessation-appreciate Neuropsych consult 13. Leukocytosis   WBCs 11.1 on 12/14   Cont to monitor 14.  Hemorrhoids    Anusol ordered on 12/17  LOS (Days) 10 A FACE TO FACE EVALUATION WAS PERFORMED  Angel Davies  Anahid Eskelson 07/16/2017, 8:34 AM

## 2017-07-17 ENCOUNTER — Inpatient Hospital Stay (HOSPITAL_COMMUNITY): Payer: Managed Care, Other (non HMO) | Admitting: Occupational Therapy

## 2017-07-17 ENCOUNTER — Inpatient Hospital Stay (HOSPITAL_COMMUNITY): Payer: Managed Care, Other (non HMO) | Admitting: Physical Therapy

## 2017-07-17 DIAGNOSIS — R791 Abnormal coagulation profile: Secondary | ICD-10-CM

## 2017-07-17 LAB — PROTIME-INR
INR: 3.17
Prothrombin Time: 32.2 seconds — ABNORMAL HIGH (ref 11.4–15.2)

## 2017-07-17 MED ORDER — WARFARIN SODIUM 1 MG PO TABS
1.0000 mg | ORAL_TABLET | Freq: Once | ORAL | Status: AC
  Administered 2017-07-17: 1 mg via ORAL
  Filled 2017-07-17: qty 1

## 2017-07-17 NOTE — Progress Notes (Signed)
Subjective/Complaints: Patient seen lying in bed this morning. He states he slept well overnight. He notes improvement with hemorrhoids.  ROS: Denies nausea, vomiting, diarrhea, shortness of breath or chest pain   Objective: Vital Signs: Blood pressure 111/63, pulse 78, temperature 98.3 F (36.8 C), temperature source Oral, resp. rate 16, height 5\' 9"  (1.753 m), weight 89 kg (196 lb 3.4 oz), SpO2 98 %. No results found. Results for orders placed or performed during the hospital encounter of 07/06/17 (from the past 72 hour(s))  Protime-INR     Status: Abnormal   Collection Time: 07/15/17 10:58 AM  Result Value Ref Range   Prothrombin Time 33.9 (H) 11.4 - 15.2 seconds   INR 3.38   Protime-INR     Status: Abnormal   Collection Time: 07/16/17  6:31 AM  Result Value Ref Range   Prothrombin Time 32.5 (H) 11.4 - 15.2 seconds   INR 3.20   Protime-INR     Status: Abnormal   Collection Time: 07/17/17  6:40 AM  Result Value Ref Range   Prothrombin Time 32.2 (H) 11.4 - 15.2 seconds   INR 3.17     Gen NAD. Vital signs reviewed.  HEENT: normal. Atraumatic Cardio: RRR. No JVD  Resp: CTA Bilaterally. Normal effort  GI: BS positive and ND Musc/Skel:  No tenderness. No edema. Neuro: Alert and oriented Motor: LUE: 4-/5 proximal to distal  LLE: HF, KE 4+/5, ADF/PF 1/5, wiggles toes   Skin:   Intact. Warm and dry  Assessment/Plan: 1. 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 Physiatrist is providing close team supervision and 24 hour management of active medical problems listed below. Physiatrist and rehab team continue to assess barriers to discharge/monitor patient progress toward functional and medical goals. FIM: Function - Bathing Bathing activity did not occur: Refused Position: Shower Body parts bathed by patient: Left arm, Chest, Abdomen, Front  perineal area, Buttocks, Left upper leg, Right upper leg, Right lower leg, Left lower leg Body parts bathed by helper: Left lower leg, Right lower leg, Right arm, Back Bathing not applicable: Back Assist Level: Supervision or verbal cues  Function- Upper Body Dressing/Undressing What is the patient wearing?: Pull over shirt/dress Pull over shirt/dress - Perfomed by patient: Thread/unthread right sleeve, Put head through opening, Thread/unthread left sleeve, Pull shirt over trunk Pull over shirt/dress - Perfomed by helper: Thread/unthread left sleeve Assist Level: Supervision or verbal cues Function - Lower Body Dressing/Undressing What is the patient wearing?: Shoes, Underwear, Pants Position: Other (comment)(sitting on TTB) Underwear - Performed by patient: Thread/unthread right underwear leg, Thread/unthread left underwear leg, Pull underwear up/down Underwear - Performed by helper: Thread/unthread left underwear leg, Pull underwear up/down Pants- Performed by patient: Thread/unthread right pants leg, Thread/unthread left pants leg, Pull pants up/down Pants- Performed by helper: Pull pants up/down, Thread/unthread left pants leg Non-skid slipper socks- Performed by helper: Don/doff right sock, Don/doff left sock Socks - Performed by helper: Don/doff right sock, Don/doff left sock Shoes - Performed by patient: Don/doff right shoe, Don/doff left shoe Shoes - Performed by helper: Don/doff right shoe, Don/doff left shoe, Fasten right, Fasten left Assist for footwear: Supervision/touching assist Assist for lower body dressing: Supervision or verbal cues  Function - Toileting Toileting activity did not occur: (no BM, used urinal) Toileting steps completed by patient: Adjust clothing prior to toileting, Performs perineal hygiene, Adjust clothing after toileting Toileting steps completed by helper: Adjust clothing  prior to toileting, Adjust clothing after toileting Toileting Assistive Devices:  Grab bar or rail Assist level: Supervision or verbal cues  Function - ArchivistToilet Transfers Toilet transfer activity did not occur: N/A Toilet transfer assistive device: Grab bar Assist level to toilet: Touching or steadying assistance (Pt > 75%) Assist level from toilet: Touching or steadying assistance (Pt > 75%)  Function - Chair/bed transfer Chair/bed transfer activity did not occur: N/A Chair/bed transfer method: Stand pivot Chair/bed transfer assist level: Supervision or verbal cues Chair/bed transfer assistive device: Armrests, Cane, Orthosis Chair/bed transfer details: Verbal cues for precautions/safety, Manual facilitation for weight shifting  Function - Locomotion: Wheelchair Will patient use wheelchair at discharge?: Yes Type: Manual Max wheelchair distance: 75 Assist Level: Supervision or verbal cues Assist Level: Supervision or verbal cues Wheel 150 feet activity did not occur: Safety/medical concerns Turns around,maneuvers to table,bed, and toilet,negotiates 3% grade,maneuvers on rugs and over doorsills: No Function - Locomotion: Ambulation Assistive device: Orthosis, Cane-quad Max distance: 200 Assist level: Touching or steadying assistance (Pt > 75%) Assist level: Touching or steadying assistance (Pt > 75%) Walk 50 feet with 2 turns activity did not occur: Safety/medical concerns Assist level: Touching or steadying assistance (Pt > 75%) Walk 150 feet activity did not occur: Safety/medical concerns Assist level: Touching or steadying assistance (Pt > 75%) Walk 10 feet on uneven surfaces activity did not occur: Safety/medical concerns  Function - Comprehension Comprehension: Auditory Comprehension assist level: Follows complex conversation/direction with no assist  Function - Expression Expression: Verbal Expression assist level: Expresses complex ideas: With no assist  Function - Social Interaction Social Interaction assist level: Interacts appropriately with  others - No medications needed.  Function - Problem Solving Problem solving assist level: Solves complex problems: Recognizes & self-corrects  Function - Memory Memory assist level: More than reasonable amount of time Patient normally able to recall (first 3 days only): Current season, Location of own room, Staff names and faces, That he or she is in a hospital  Medical Problem List and Plan:  1. 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 DVT Prophylaxis/Anticoagulation: Pharmaceutical: INR supratherapeutic, off heparin 3. Pain Management: tylenol prn  4. Mood: LCSW to follow for evaluation and support. Neuropsychology follow-up with the patient  5. Neuropsych: This patient is capable of making decisions on his own behalf.  6. Skin/Wound Care: Routine pressure relief measures.  7. Fluids/Electrolytes/Nutrition: Monitor I/O.  8. Severe CAD with CM: Treated medically with ASA, statin , no angina  9. Acute systolic CHF: Was started spironolactone and digoxin on 12/7. Cardiology to follow, avoid hypotension/hypoperfusion. No clinical signs of failure 12/16     [facility]   07/15/17 [id number] 07/16/17 0550 07/17/17 0549  Weight: 87.2 kg (192 lb 3.9 oz) 88.6 kg (195 lb 5.2 oz) 89 kg (196 lb 3.4 oz)    ?Trending up  Vitals:   07/16/17 1457 07/17/17 0549  BP: 111/64 111/63  Pulse: 92 78  Resp: 16 16  Temp: 98.6 F (37 C) 98.3 F (36.8 C)  SpO2: 97% 98%   10.  Apical thrombus cont warfarin   INR supratherapeutic on 12/18 11. Prediabetes: Hgb A1C- 6.0. Will have dietitian educate patient on CM diet--monitor BS for trends.  12. Polysubstance abuse: Has been counseled on importance of alcohol, cocaine and THC cessation-appreciate Neuropsych consult 13. Leukocytosis   WBCs 11.1 on 12/14   Cont to monitor 14. Hemorrhoids    Anusol  ordered on 12/17   Improving   LOS (Days) 11 A FACE TO FACE  EVALUATION WAS PERFORMED  Ankit Karis Juba 07/17/2017, 8:18 AM

## 2017-07-17 NOTE — Progress Notes (Signed)
Physical Therapy Session Note  Patient Details  Name: Angel Davies MRN: [id number] Date of Birth: 08/27/1970  Today's Date: 07/17/2017 PT Individual Time: 1415-1515 PT Individual Time Calculation (min): 60 min   Short Term Goals: Week 2:  PT Short Term Goal 1 (Week 2): =LTGs due to ELOS  Skilled Therapeutic Interventions/Progress Updates:    no c/o pain at rest.  Session focus on gait training with body weight support treadmill training.    Pt [facility] and [facility].  Gait training on BWS system with supervision and mod cues for L swing through, heel strike, and controlled plantarflexion.  Pt completes 3 rounds of ambulation, 250-350', but during second trial reports increasing pain in R shin describes as shin splints progressing from 5/10 to 8/10.  Seated rest break, stretching, and ice applied.  Discussed potential cause for pain in that region and prepped a cup of ice for ice massage later this evening/tomorrow.  Pt returned to room at end of session and positioned in recliner with call bell in reach, missed 15 minutes due to pain.    Therapy Documentation Precautions:  Precautions Precautions: Fall Restrictions Weight Bearing Restrictions: No General: PT Amount of Missed Time (min): 15 Minutes PT Missed Treatment Reason: Pain   See Function Navigator for Current Functional Status.   Therapy/Group: Individual Therapy  Stephania FragminCaitlin E Lis Savitt 07/17/2017, 3:16 PM

## 2017-07-17 NOTE — Progress Notes (Signed)
Occupational Therapy Session Note  Patient Details  Name: Angel Davies MRN: [id number] Date of Birth: 07/25/1971  Today's Date: 07/17/2017  Session 1 OT Individual Time: 1101-1201 OT Individual Time Calculation (min): 60 min   Session 2 OT Individual Time: 1230-1330 OT Individual Time Calculation (min): 60 min    Short Term Goals: Week 2:  OT Short Term Goal 1 (Week 2): Pt will transfer to toilet/BSC with supervision and LRAD OT Short Term Goal 2 (Week 2): Pt will demonstrate proficiency in L UE self ROM OT Short Term Goal 3 (Week 2): Pt ambulate in room with LRAD  to collect clothing for BADL tasks with Min A  Skilled Therapeutic Interventions/Progress Updates:    Session 1 OT treatment session focused on modified bathing/dressing, functional use of L UE, L NMR, and functional ambulation. Pt ambulated in room with RW to dresser drawers. Worked on collecting clothing using L UE, pt able to grasp clothing with guided A. Pt ambulated to shower and doffed clothing with supervision. Shower completed with supervision and integrated L UE into 75% of bathing tasks. Pt needed min cues to recall hemi-dressing techniques. Forced use of L UE when pulling up pants with pt able to grasp pants today and pull up over L hip. Pt needed assist to button pants and tie shoes but has much improved hand function.   Session 2 Pt ambulated with quad cane to therapy gym with steady assist. L fine motor coordination, L hand strengthening, and L NMR. Graded buttoning task with L elbow support, to focus on more distal control of hand/wrist. Tying activity in preparation for shoe tying-pt able to complete with repetition. Pt reported muscle pain in scap and upper trap from overuse and exaggerated shoulder movement compensated for distal weakness. Applied K tape to scap and upper trap, then worked on graded peg board activity with facilitation to elicit normal movement patterns and decrease excessive shoulder movement.  Pt ambulated back to room in similar fashion as above and left seated in recliner with needs met.   Therapy Documentation Precautions:  Precautions Precautions: Fall Restrictions Weight Bearing Restrictions: No Pain:   none/denies pain See Function Navigator for Current Functional Status.   Therapy/Group: Individual Therapy   S  07/17/2017, 1:34 PM  

## 2017-07-17 NOTE — Progress Notes (Signed)
ANTICOAGULATION CONSULT NOTE - Follow Up Consult  Pharmacy Consult for warfarin Indication: stroke and LV thrombus  No Known Allergies  Patient Measurements: Height: 5\' 9"  (175.3 cm) Weight: 196 lb 3.4 oz (89 kg) IBW/kg (Calculated) : 70.7  Vital Signs: Temp: 98.3 F (36.8 C) (12/18 0549) Temp Source: Oral (12/18 0549) BP: 111/63 (12/18 0549) Pulse Rate: 78 (12/18 0549)  Labs: Recent Labs    07/15/17 1058 07/16/17 0631 07/17/17 0640  LABPROT 33.9* 32.5* 32.2*  INR 3.38 3.20 3.17    Estimated Creatinine Clearance: 109.5 mL/min (by C-G formula based on SCr of 0.91 mg/dL).  Assessment: 46 y/o M s/p acute embolic stroke, also found to have LV thrombus. s/p heparin-stopped 12/9. Warfarin started 12/7. Patient seems to be very sensitive to warfarin, possibly d/t fenofibrate. Warfarin was held 12/9 and 12/10 d/t rapidly increasing INR.   INR supratherapeutic at 3.17today. No new CBC, but has been stable, pt ambulating and no reports of bleeding today.    Goal of Therapy:  INR goal 2-3 Monitor platelets by anticoagulation protocol: Yes   Plan:  Repeat warfarin 1 mg x 1 today Monitor daily INR, s/sx of bleed, PO intake, DDI Weekly CBC  Bayard HuggerMei Winola Drum, PharmD, BCPS  Clinical Pharmacist  Pager: [phone]   07/17/2017 1:14 PM

## 2017-07-17 NOTE — Evaluation (Signed)
Recreational Therapy Assessment and Plan  Patient Details  Name: Angel Davies MRN: [id number] Date of Birth: 1970/09/13 Today's Date: 07/17/2017  Rehab Potential:  Good ELOS: discharge 12/28 or sooner   Assessment   Problem List:      Patient Active Problem List   Diagnosis Date Noted  . Right pontine stroke ([place]) 07/06/2017  . Left hemiparesis ([place])   . Cardiomyopathy, ischemic   . LV (left ventricular) mural thrombus following MI ([place])   . Acute systolic heart failure ([place])   . Myocardiopathy ([place])   . Mixed hyperlipidemia   . CVA (cerebral vascular accident) ([place]) 07/02/2017  . Alcohol use 07/02/2017  . Anxiety 07/02/2017  . Depression 07/02/2017  . Acute hyperglycemia 07/02/2017  . Elevated blood-pressure reading without diagnosis of hypertension 07/02/2017  . ADHD 07/02/2017  . Cocaine abuse ([place]) 07/02/2017  . Marijuana abuse 06/20/2017  . Cocaine use 06/20/2017    Past Medical History:      Past Medical History:  Diagnosis Date  . ADHD (attention deficit hyperactivity disorder)   . Anxiety   . CVA (cerebral vascular accident) ([place]) 07/02/2017   "left sided weakness" (07/02/2017)  . Depression   . History of kidney stones    Past Surgical History:       Past Surgical History:  Procedure Laterality Date  . CYSTOSCOPY WITH URETEROSCOPY, STONE BASKETRY AND STENT PLACEMENT    . RIGHT/LEFT HEART CATH AND CORONARY ANGIOGRAPHY N/A 07/05/2017   Procedure: RIGHT/LEFT HEART CATH AND CORONARY ANGIOGRAPHY;  Surgeon: Jolaine Artist, MD;  Location: [facility];  Service: Cardiovascular;  Laterality: N/A;    Assessment & Plan Clinical Impression: Angel Davies is a 46 y.o. male with history of ADHD, anxiety, depression/anxiety, fall 12/2016 with loss of peripheral vision and onset of HA, polysubtance abuse who was admitted on 07/02/17 with fall, slurred speech and left sided weakness. Patient reported that he was drinking ETOH and used  cocaine the night before--UDS positive for cocaine and THC. CT head negative. MRI/MRA brain done revealing " Acute RIGHT pontine infarct, Subacute small RIGHT temporal occipital lobe/PCA territory infarct and severe stenosis distal rigth P2 segment. CTA head/neck was negative for emergent vascular finding, 40% left SA stenosis due to plaque and emphysema. 2D echo showed EF 20-25% with moderately dilated LV, akinesis of anterior and apical myocardium with grade 2 diastolic dysfunction.  He was evaluated by Dr. Haroldine Laws and underwent cardiac cath on 12/6 revealing severe CAD with occluded mid LAD and well compensated filling pressures with moderately reduced cardiac output. Cardiac MRI ordered today for work up of decrease in EF out of proportion to CAD.  MRI reveals severe left ventricular dysfunction with ejection fraction of 18% and septal/apical akinesis with diffuse hypokinesis.  Also notable is an apical thrombus measuring 11 mm x 8 mm and a full-thickness scar involving the left mid and distal septum and apex.  Additionally a subendocardial scar involving the basal inferior lateral wall was seen.  Patient is to be treated medically with repeat echo in 3 months. He continues on heparin/coumadin bridge.   Pt presents with decreased activity tolerance, decreased functional mobility, decreased balance, ataxia, decreased coordination, left inattention, decreased problem solving and decreased safety awareness Limiting pt's independence with leisure/community pursuits.  Plan  Min 1 TR session for community reintegration during LOS > 60 minutes Recommendations for other services: None   Discharge Criteria: Patient will be discharged from TR if patient refuses treatment 3 consecutive times without medical reason.  If treatment goals not met, if there is a change in medical status, if patient makes no progress towards goals or if patient is discharged from hospital.  The above assessment, treatment plan,  treatment alternatives and goals were discussed and mutually agreed upon: by patient  [place] 07/17/2017, 1:21 PM

## 2017-07-18 ENCOUNTER — Inpatient Hospital Stay (HOSPITAL_COMMUNITY): Payer: Managed Care, Other (non HMO) | Admitting: Physical Therapy

## 2017-07-18 ENCOUNTER — Inpatient Hospital Stay (HOSPITAL_COMMUNITY): Payer: Managed Care, Other (non HMO) | Admitting: Occupational Therapy

## 2017-07-18 LAB — PROTIME-INR
INR: 3.04
Prothrombin Time: 31.3 seconds — ABNORMAL HIGH (ref 11.4–15.2)

## 2017-07-18 MED ORDER — WARFARIN SODIUM 1 MG PO TABS
1.0000 mg | ORAL_TABLET | Freq: Once | ORAL | Status: AC
  Administered 2017-07-18: 1 mg via ORAL
  Filled 2017-07-18: qty 1

## 2017-07-18 NOTE — Patient Care Conference (Signed)
Inpatient RehabilitationTeam Conference and Plan of Care Update Date: 07/18/2017   Time: 11:00 AM    Patient Name: Angel CooperJames D Davies      Medical Record Number: [id number]  Date of Birth: 08/15/1970 Sex: Male         Room/Bed: 4M01C/4M01C-01 Payor Info: Payor: [employer] / Plan: Optician, dispensingCIGNA MANAGED / Product Type: *No Product type* /    Admitting Diagnosis: CVA  Admit Date/Time:  07/06/2017  5:10 PM Admission Comments: No comment available   Primary Diagnosis:  <principal problem not specified> Principal Problem: <principal problem not specified>  Patient Active Problem List   Diagnosis Date Noted  . Supratherapeutic INR   . Leukocytosis   . Coronary artery disease involving native coronary artery of native heart without angina pectoris   . Prediabetes   . Hemorrhoids   . 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 Left hemiparesis ([facility])   . Cardiomyopathy, ischemic   . 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 Acute systolic heart failure ([facility])   . Myocardiopathy ([facility])   . Mixed hyperlipidemia   . CVA (cerebral vascular accident) ([facility]) 07/02/2017  . Alcohol use 07/02/2017  . Anxiety 07/02/2017  . Depression 07/02/2017  . Acute hyperglycemia 07/02/2017  . Elevated blood-pressure reading without diagnosis of hypertension 07/02/2017  . ADHD 07/02/2017  . Cocaine abuse ([facility]) 07/02/2017  . Marijuana abuse 06/20/2017  . Cocaine use 06/20/2017    Expected Discharge Date: Expected Discharge Date: 07/27/17  Team Members Present: Physician leading conference: Dr. Maryla MorrowAnkit Patel Social Worker Present: Staci AcostaJenny Farheen Pfahler, LCSW Nurse Present: Allayne Stackhelsey Evans, RN PT Present: Teodoro Kilaitlin Penven-Crew, PT OT Present: Kearney HardElisabeth Doe, OT SLP Present: Colin BentonMadison Cratch, SLP PPS Coordinator present : Tora DuckMarie Noel, RN, CRRN     Current Status/Progress Goal Weekly Team Focus  Medical   Functional deficits and left hemiparesis secondary to acute right pontine infarct/subacute right temporal/occipital  infarcts   Improve mobility, safety, WBCs, INR, heart failure  See above   Bowel/Bladder   continent of bowel and bladder LBM 12-17  Continent of bowel and bladder maintain regular bowel pattern   Assist with tolieting needs prn   Swallow/Nutrition/ Hydration             ADL's   Min A/ overall  Mod I BADL supevision iADL  L NMR, functional use of L UE, modified bathing/dressing, standing balance   Mobility   supervision transfers, and close supervision gait with quad cane  mod I transfers and supervision gait with LRAD  LLE NMR, balance, safety awareness, progressing functional mobility    Communication             Safety/Cognition/ Behavioral Observations            Pain   no c/o pain  no c/o pain  Assess pain q shift and prn and treat prn   Skin   CDI  CDI  Assess skin q shift and prn    Rehab Goals Patient on target to meet rehab goals: Yes Rehab Goals Revised: none *See Care Plan and progress notes for long and short-term goals.     Barriers to Discharge  Current Status/Progress Possible Resolutions Date Resolved   Physician    Medical stability     See above  Therapies, adjusting anticoagulation, follow labs, follow weights      Nursing                  PT  OT                  SLP                SW                Discharge Planning/Teaching Needs:  Pt plans to return to his home where he lives with his wife and his father.  Pt with mainly mod I goals.  Wife can come for family education, as warranted.   Team Discussion:  Dr. Allena KatzPatel with pharmacy is still monitoring pt's INR and adjusting coumadin accordingly.  Pt's WBC is trending down and labs to be checked tomorrow.  Pt being treated for hemorrhoids.  Pt told PT he did not need his AFO, but he tried therapy without it and his shin bothered him, so PT is hopeful he will try it again.  Pt moves well, but still lacks some safety awareness.  Pt is getting good return in his LUE and his grasp is  better.  Pt on track for d/c on 07-27-17.  Revisions to Treatment Plan:  none    Continued Need for Acute Rehabilitation Level of Care: The patient requires daily medical management by a physician with specialized training in physical medicine and rehabilitation for the following conditions: Daily direction of a multidisciplinary physical rehabilitation program to ensure safe treatment while eliciting the highest outcome that is of practical value to the patient.: Yes Daily medical management of patient stability for increased activity during participation in an intensive rehabilitation regime.: Yes Daily analysis of laboratory values and/or radiology reports with any subsequent need for medication adjustment of medical intervention for : Neurological problems;Cardiac problems  Angel Davies, Vista DeckJennifer Capps 07/18/2017, 1:28 PM

## 2017-07-18 NOTE — Progress Notes (Signed)
Occupational Therapy Session Note  Patient Details  Name: Angel CooperJames D Sliker MRN: [id number] Date of Birth: 04/10/1971  Today's Date: 07/18/2017 OT Individual Time: [id number] and [id number] OT Individual Time Calculation (min): 44 min and 27 min    Short Term Goals: Week 2:  OT Short Term Goal 1 (Week 2): Pt will transfer to toilet/BSC with supervision and LRAD OT Short Term Goal 2 (Week 2): Pt will demonstrate proficiency in L UE self ROM OT Short Term Goal 3 (Week 2): Pt ambulate in room with LRAD  to collect clothing for BADL tasks with Min A  Skilled Therapeutic Interventions/Progress Updates:    Session 1: Session focus on functional mobility, standing endurance, and LUE NMR. Pt presented exiting bathroom using quad cane. Sits EOB to don socks, shoes and L AFO; Pt requires increased time for donning L sock and shoe, verbal cues for technique. Pt ambulates throughout session using quad cane with close minGuard-steadying assist throughout, ambulating to ortho gym and rehab gym. Pt engages in seated tabletop activity facilitating pincher grasp to place clothespins using LUE with verbal cues and manual facilitation for proper body mechanics during task completion. Pt engages in standing activity facilitating wt bearing through LUE while completing puzzle, one seated rest break during task completion. Pt returns to room in manner described above where Pt was left seated in recliner, needs within reach.   Session 2: Pt presents seated in recliner agreeable to OT tx session. Focus of session on further LUE NMR with focus on increasing FMC and fine motor strength. Pt participates in seated theraputty HEP, requires verbal cues throughout to maintain proper positioning and to decrease compensatory movements with shoulder during task completion. Pt left seated in recliner end of session, sister-in-law present to visit, call bell and needs within reach.   Therapy Documentation Precautions:   Precautions Precautions: Fall Restrictions Weight Bearing Restrictions: No    Pain: Pain Assessment Pain Assessment: No/denies pain Pain Score: 0-No pain  See Function Navigator for Current Functional Status.   Therapy/Group: Individual Therapy  Orlando PennerBreanna L Maison Agrusa 07/18/2017, 12:14 PM

## 2017-07-18 NOTE — Progress Notes (Signed)
Occupational Therapy Session Note  Patient Details  Name: Angel Davies MRN: [id number] Date of Birth: Jan 18, 1971  Today's Date: 07/18/2017 OT Individual Time: 1330-1450 OT Individual Time Calculation (min): 80 min   Short Term Goals: Week 2:  OT Short Term Goal 1 (Week 2): Pt will transfer to toilet/BSC with supervision and LRAD OT Short Term Goal 2 (Week 2): Pt will demonstrate proficiency in L UE self ROM OT Short Term Goal 3 (Week 2): Pt ambulate in room with LRAD  to collect clothing for BADL tasks with Min A  Skilled Therapeutic Interventions/Progress Updates:    OT treatment session focused on modified bathing/dressing, functional use of L UE, and L fine motor control. Pt ambulated in room with quad cane and 50% supervision w/ intermittent min guard A for lateral LOB. Fine motor control with buttoning and unbuttoning pants w/ pt able to fasten pants today. Bathing completed with overall set-up A and supervision. Min questioning cues for hemi-techniques with dressing. UB dressing set-up and min A for LB dressing to don L shoe. Pt then worked on B UE strength/cooridnation to propel wc. Functional use of L UE to push elevator buttons with pt needing guided A to isolate index. L FMC using theraputty and coins. Focused on in-hand manipulation of coins and hand intrinsic strengthening. Graded activity by using table to support shoulder, then had pt focus on elbow/wrist/hand to pick up small pieces of putty. Pt returned to room and left seated in wc with needs met.    Therapy Documentation Precautions:  Precautions Precautions: Fall Restrictions Weight Bearing Restrictions: No  See Function Navigator for Current Functional Status.   Therapy/Group: Individual Therapy  [name] 07/18/2017, 1:54 PM

## 2017-07-18 NOTE — Progress Notes (Signed)
Physical Therapy Session Note  Patient Details  Name: Angel Davies MRN: [id number] Date of Birth: Jun 26, 1971  Today's Date: 07/18/2017 PT Individual Time: 6861-6837 PT Individual Time Calculation (min): 80 min   Short Term Goals: Week 2:  PT Short Term Goal 1 (Week 2): =LTGs due to ELOS  Skilled Therapeutic Interventions/Progress Updates:    no c/o pain.  Session focus on balance.    Pt ambulates to and from therapy gym with [facility] with supervision.  PT administered BERG Balance Test (results below), and patient demonstrates moderate fall risk as noted by score of 46/56 on Berg Balance Scale.  Discussed results and made recommendations for continued use of SBQC with therapy progression to using no AD.  Pt continues to report he doesn't want to wear his AFO because he "knows what it does, and so now I do it."  Education ongoing.    Pt completed 2 rounds of Wii archery focus on postural control in sitting with bilateral UE use.  Pt completed 2 rounds of Wii bowling focus on sit<>stand without UE support and standing balance in split stance during dynamic task.    Pt returned to room at end of session, positioned upright in recliner with call bell in reach and needs met.    Therapy Documentation Precautions:  Precautions Precautions: Fall Restrictions Weight Bearing Restrictions: No  Balance: Standardized Balance Assessment Standardized Balance Assessment: Berg Balance Test Berg Balance Test Sit to Stand: Able to stand without using hands and stabilize independently Standing Unsupported: Able to stand safely 2 minutes Sitting with Back Unsupported but Feet Supported on Floor or Stool: Able to sit safely and securely 2 minutes Stand to Sit: Sits safely with minimal use of hands Transfers: Able to transfer safely, minor use of hands Standing Unsupported with Eyes Closed: Able to stand 10 seconds with supervision Standing Ubsupported with Feet Together: Able to place feet together  independently and stand for 1 minute with supervision From Standing, Reach Forward with Outstretched Arm: Can reach confidently >25 cm (10") From Standing Position, Pick up Object from Floor: Able to pick up shoe safely and easily From Standing Position, Turn to Look Behind Over each Shoulder: Looks behind from both sides and weight shifts well Turn 360 Degrees: Able to turn 360 degrees safely in 4 seconds or less Standing Unsupported, Alternately Place Feet on Step/Stool: Able to complete >2 steps/needs minimal assist Standing Unsupported, One Foot in Front: Needs help to step but can hold 15 seconds Standing on One Leg: Able to lift leg independently and hold equal to or more than 3 seconds Total Score: 46   See Function Navigator for Current Functional Status.   Therapy/Group: Individual Therapy  [name] 07/18/2017, 4:54 PM

## 2017-07-18 NOTE — Progress Notes (Signed)
Subjective/Complaints: Patient seen sitting up at the edge of his bed eating breakfast. Wife at bedside. Wife with concerns about left ankle swelling.  ROS: Denies nausea, vomiting, diarrhea, shortness of breath or chest pain   Objective: Vital Signs: Blood pressure 112/60, pulse 82, temperature 98 F (36.7 C), temperature source Oral, resp. rate 16, height 5\' 9"  (1.753 m), weight 89.1 kg (196 lb 8.5 oz), SpO2 96 %. No results found. Results for orders placed or performed during the hospital encounter of 07/06/17 (from the past 72 hour(s))  Protime-INR     Status: Abnormal   Collection Time: 07/15/17 10:58 AM  Result Value Ref Range   Prothrombin Time 33.9 (H) 11.4 - 15.2 seconds   INR 3.38   Protime-INR     Status: Abnormal   Collection Time: 07/16/17  6:31 AM  Result Value Ref Range   Prothrombin Time 32.5 (H) 11.4 - 15.2 seconds   INR 3.20   Protime-INR     Status: Abnormal   Collection Time: 07/17/17  6:40 AM  Result Value Ref Range   Prothrombin Time 32.2 (H) 11.4 - 15.2 seconds   INR 3.17   Protime-INR     Status: Abnormal   Collection Time: 07/18/17  7:13 AM  Result Value Ref Range   Prothrombin Time 31.3 (H) 11.4 - 15.2 seconds   INR 3.04     Gen NAD. Vital signs reviewed.  HEENT: normal. Atraumatic Cardio: RRR. No JVD  Resp: CTA Bilaterally. Normal effort  GI: BS positive and ND Musc/Skel:  No tenderness. Very minimal edema left ankle. Neuro: Alert and oriented Motor: LUE: 4-/5 proximal to distal  LLE: HF, KE 4+/5, ADF/PF 1/5, wiggles toes   Skin:   Intact. Warm and dry  Assessment/Plan: 1. 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 Physiatrist is providing close team supervision and 24 hour management of active medical problems listed below. Physiatrist and rehab team continue to assess barriers to discharge/monitor patient  progress toward functional and medical goals. FIM: Function - Bathing Bathing activity did not occur: Refused Position: Shower Body parts bathed by patient: Left arm, Chest, Abdomen, Front perineal area, Buttocks, Left upper leg, Right upper leg, Right lower leg, Left lower leg, Right arm Body parts bathed by helper: Left lower leg, Right lower leg, Right arm, Back Bathing not applicable: Back Assist Level: Supervision or verbal cues  Function- Upper Body Dressing/Undressing What is the patient wearing?: Pull over shirt/dress Pull over shirt/dress - Perfomed by patient: Thread/unthread right sleeve, Thread/unthread left sleeve, Put head through opening, Pull shirt over trunk Pull over shirt/dress - Perfomed by helper: Thread/unthread left sleeve Assist Level: Supervision or verbal cues Function - Lower Body Dressing/Undressing What is the patient wearing?: Socks, Shoes, Pants, Underwear Position: Sitting EOB Underwear - Performed by patient: Thread/unthread right underwear leg, Thread/unthread left underwear leg, Pull underwear up/down Underwear - Performed by helper: Thread/unthread left underwear leg, Pull underwear up/down Pants- Performed by patient: Thread/unthread right pants leg, Thread/unthread left pants leg, Pull pants up/down Pants- Performed by helper: Fasten/unfasten pants Non-skid slipper socks- Performed by helper: Don/doff right sock, Don/doff left sock Socks - Performed by patient: Don/doff right sock, Don/doff left sock Socks - Performed by helper: Don/doff right sock, Don/doff left sock Shoes - Performed by patient: Don/doff right shoe, Don/doff left shoe Shoes - Performed by helper: Fasten left, Fasten right Assist for footwear: Supervision/touching assist Assist for  lower body dressing: Touching or steadying assistance (Pt > 75%)  Function - Toileting Toileting activity did not occur: (no BM, used urinal) Toileting steps completed by patient: Adjust clothing prior  to toileting, Performs perineal hygiene, Adjust clothing after toileting Toileting steps completed by helper: Adjust clothing prior to toileting, Adjust clothing after toileting Toileting Assistive Devices: Grab bar or rail Assist level: Supervision or verbal cues  Function - Toilet Transfers Toilet transfer activity did not occur: N/A Toilet transfer assistive device: Grab bar Assist level to toilet: Touching or steadying assistance (Pt > 75%) Assist level from toilet: Touching or steadying assistance (Pt > 75%)  Function - Chair/bed transfer Chair/bed transfer activity did not occur: N/A Chair/bed transfer method: Stand pivot Chair/bed transfer assist level: Supervision or verbal cues Chair/bed transfer assistive device: Armrests, Cane, Orthosis Chair/bed transfer details: Verbal cues for precautions/safety, Manual facilitation for weight shifting  Function - Locomotion: Wheelchair Will patient use wheelchair at discharge?: Yes Type: Manual Max wheelchair distance: 75 Assist Level: Supervision or verbal cues Assist Level: Supervision or verbal cues Wheel 150 feet activity did not occur: Safety/medical concerns Turns around,maneuvers to table,bed, and toilet,negotiates 3% grade,maneuvers on rugs and over doorsills: No Function - Locomotion: Ambulation Assistive device: Orthosis, Cane-quad Max distance: 200 Assist level: Touching or steadying assistance (Pt > 75%) Assist level: Touching or steadying assistance (Pt > 75%) Walk 50 feet with 2 turns activity did not occur: Safety/medical concerns Assist level: Touching or steadying assistance (Pt > 75%) Walk 150 feet activity did not occur: Safety/medical concerns Assist level: Touching or steadying assistance (Pt > 75%) Walk 10 feet on uneven surfaces activity did not occur: Safety/medical concerns  Function - Comprehension Comprehension: Auditory Comprehension assist level: Follows complex conversation/direction with no  assist  Function - Expression Expression: Verbal Expression assist level: Expresses complex ideas: With no assist  Function - Social Interaction Social Interaction assist level: Interacts appropriately with others - No medications needed.  Function - Problem Solving Problem solving assist level: Solves complex problems: Recognizes & self-corrects  Function - Memory Memory assist level: More than reasonable amount of time Patient normally able to recall (first 3 days only): Current season, Location of own room, Staff names and faces, That he or she is in a hospital  Medical Problem List and Plan:  1. 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 DVT Prophylaxis/Anticoagulation: Pharmaceutical:    INR supratherapeutic on 12/19 3. Pain Management: tylenol prn  4. Mood: LCSW to follow for evaluation and support. Neuropsychology follow-up with the patient  5. Neuropsych: This patient is capable of making decisions on his own behalf.  6. Skin/Wound Care: Routine pressure relief measures.  7. Fluids/Electrolytes/Nutrition: Monitor I/O.  8. Severe CAD with CM: Treated medically with ASA, statin , no angina  9. Acute systolic CHF: Was started spironolactone and digoxin on 12/7. Cardiology to follow, avoid hypotension/hypoperfusion. No clinical signs of failure 12/16     [facility] Weights   07/16/17 0550 07/17/17 0549 07/18/17 0611  Weight: 88.6 kg (195 lb 5.2 oz) 89 kg (196 lb 3.4 oz) 89.1 kg (196 lb 8.5 oz)    Vitals:   07/17/17 1335 07/18/17 0611  BP: 102/66 112/60  Pulse: 84 82  Resp: 18 16  Temp: 98.6 F (37 C) 98 F (36.7 C)  SpO2: 95% 96%   10.  Apical thrombus cont warfarin 11. Prediabetes: Hgb A1C- 6.0. Will have dietitian educate patient on  CM diet--monitor BS for trends.  12. Polysubstance abuse: Has been counseled on importance of alcohol, cocaine and THC cessation-appreciate  Neuropsych consult 13. Leukocytosis   WBCs 11.1 on 12/14   Labs ordered for tomorrow   Cont to monitor 14. Hemorrhoids    Anusol ordered on 12/17   Improving   LOS (Days) 12 A FACE TO FACE EVALUATION WAS PERFORMED  Ankit Karis JubaAnil Patel 07/18/2017, 8:04 AM

## 2017-07-19 ENCOUNTER — Inpatient Hospital Stay (HOSPITAL_COMMUNITY): Payer: Managed Care, Other (non HMO) | Admitting: *Deleted

## 2017-07-19 ENCOUNTER — Inpatient Hospital Stay (HOSPITAL_COMMUNITY): Payer: Managed Care, Other (non HMO) | Admitting: Occupational Therapy

## 2017-07-19 ENCOUNTER — Inpatient Hospital Stay (HOSPITAL_COMMUNITY): Payer: Managed Care, Other (non HMO) | Admitting: Physical Therapy

## 2017-07-19 DIAGNOSIS — M791 Myalgia, unspecified site: Secondary | ICD-10-CM

## 2017-07-19 LAB — BASIC METABOLIC PANEL
ANION GAP: 7 (ref 5–15)
BUN: 14 mg/dL (ref 6–20)
CHLORIDE: 105 mmol/L (ref 101–111)
CO2: 26 mmol/L (ref 22–32)
Calcium: 9.3 mg/dL (ref 8.9–10.3)
Creatinine, Ser: 0.87 mg/dL (ref 0.61–1.24)
GFR calc non Af Amer: >60 mL/min (ref >60)
Glucose, Bld: 121 mg/dL — ABNORMAL HIGH (ref 65–99)
POTASSIUM: 4 mmol/L (ref 3.5–5.1)
SODIUM: 138 mmol/L (ref 135–145)

## 2017-07-19 LAB — CBC WITH DIFFERENTIAL/PLATELET
Basophils Absolute: 0 10*3/uL (ref 0.0–0.1)
Basophils Relative: 0 %
EOS PCT: 2 %
Eosinophils Absolute: 0.2 10*3/uL (ref 0.0–0.7)
HCT: 43.8 % (ref 39.0–52.0)
Hemoglobin: 14.6 g/dL (ref 13.0–17.0)
LYMPHS ABS: 4.1 10*3/uL — AB (ref 0.7–4.0)
LYMPHS PCT: 40 %
MCH: 30.9 pg (ref 26.0–34.0)
MCHC: 33.3 g/dL (ref 30.0–36.0)
MCV: 92.8 fL (ref 78.0–100.0)
MONO ABS: 1 10*3/uL (ref 0.1–1.0)
Monocytes Relative: 10 %
Neutro Abs: 4.9 10*3/uL (ref 1.7–7.7)
Neutrophils Relative %: 48 %
Platelets: 311 10*3/uL (ref 150–400)
RBC: 4.72 MIL/uL (ref 4.22–5.81)
RDW: 13.8 % (ref 11.5–15.5)
WBC: 10.2 10*3/uL (ref 4.0–10.5)

## 2017-07-19 LAB — PROTIME-INR
INR: 2.37
Prothrombin Time: 25.7 seconds — ABNORMAL HIGH (ref 11.4–15.2)

## 2017-07-19 MED ORDER — METHOCARBAMOL 500 MG PO TABS
500.0000 mg | ORAL_TABLET | Freq: Three times a day (TID) | ORAL | Status: DC | PRN
  Administered 2017-07-20: 500 mg via ORAL
  Filled 2017-07-19: qty 1

## 2017-07-19 MED ORDER — HYDROCORTISONE ACETATE 25 MG RE SUPP: 25.0000 mg | Freq: Two times a day (BID) | RECTAL | Status: DC | PRN

## 2017-07-19 MED ORDER — WARFARIN SODIUM 2 MG PO TABS
2.0000 mg | ORAL_TABLET | Freq: Once | ORAL | Status: AC
  Administered 2017-07-19: 2 mg via ORAL
  Filled 2017-07-19: qty 1

## 2017-07-19 NOTE — Progress Notes (Signed)
Occupational Therapy Session Note  Patient Details  Name: Angel Davies MRN: [id number] Date of Birth: 05/06/1971  Today's Date: 07/19/2017 OT Concurrent Time: 1000-1130 OT Concurrent Time Calculation (min): 90 min  Short Term Goals: Week 2:  OT Short Term Goal 1 (Week 2): Pt will transfer to toilet/BSC with supervision and LRAD OT Short Term Goal 2 (Week 2): Pt will demonstrate proficiency in L UE self ROM OT Short Term Goal 3 (Week 2): Pt ambulate in room with LRAD  to collect clothing for BADL tasks with Min A  Skilled Therapeutic Interventions/Progress Updates:  Pt participated in community reintegration/outing to min guard A/supervision level. Goals focused on safe community mobility using quad cane, functional use of L UE at diminished level, identification & negotiation of curbs and community obstacles, ambulating on uneven surfaces, stair negotiation, accessing public restroom, energy conservation techniques/education. Pt able to maintain grip on pitcher and pour milk into coffee using L hand. Worked on balance and L grip with ambulation while maintaining grip on coffee cup. See outing goal sheet in shadow chart for full details.   Therapy Documentation Precautions:  Precautions Precautions: Fall Restrictions Weight Bearing Restrictions: No Pain:  none/denies pain See Function Navigator for Current Functional Status.   Therapy/Group: Concurrent Therapy  Angel Davies 07/19/2017, 3:27 PM

## 2017-07-19 NOTE — Progress Notes (Signed)
Advanced Heart Failure Rounding Note  Primary Cardiologist: New (Dr. Gala RomneyBensimhon)  Subjective:    Feeling great. Making progress. Walked 200 yards with cane and no assistance.  Working on grip strength with left hand, still having trouble lifting above his head.   INR 2.37  cMRI on 12/7 with 1) Severe LVE with EF 18% septal and apical akinesis diffuse hypokinesis 2) Apical thrombus measuring 11 mm x 8 mm 3) Full thickness scar involving the mid and distal septum and apex Subendocardial scar involving the basal inferior lateral wall  R/LHC 07/05/17  Mid RCA to Dist RCA lesion is 100% stenosed.  Dist Cx lesion is 100% stenosed.  Mid LAD lesion is 100% stenosed.  Prox Cx to Dist Cx lesion is 30% stenosed.   Findings:  Ao = 104/68 (84) LV = 110/9 RA = 1 RV = 19/3 PA = 19/9 (11) PCW = 5 Fick cardiac output/index = 4.3/2.1 PVR = 1.5 WU SVR = 2122 Ao sat = 96% PA sat = 64%, 66%  Assessment: 1. Severe CAD with left-dominant system and occluded mLAD 2. Ischemic CM with EF 25% by echo 3. Well compensated filling pressures with moderately reduced cardiac output  Objective:   Weight Range: 199 lb 11.8 oz (90.6 kg) Body mass index is 29.5 kg/m.   Vital Signs:   Temp:  [97.9 F (36.6 C)-98.2 F (36.8 C)] 97.9 F (36.6 C) (12/20 0500) Pulse Rate:  [83-90] 83 (12/20 0500) Resp:  [16] 16 (12/20 0500) BP: (102-117)/(68) 102/68 (12/20 0500) SpO2:  [97 %-100 %] 100 % (12/20 0500) Weight:  [199 lb 11.8 oz (90.6 kg)] 199 lb 11.8 oz (90.6 kg) (12/20 0500) Last BM Date: 07/17/17  Weight change: Filed Weights   07/17/17 0549 07/18/17 0611 07/19/17 0500  Weight: 196 lb 3.4 oz (89 kg) 196 lb 8.5 oz (89.1 kg) 199 lb 11.8 oz (90.6 kg)    Intake/Output:   Intake/Output Summary (Last 24 hours) at 07/19/2017 0926 Last data filed at 07/19/2017 0800 Gross per 24 hour  Intake 560 ml  Output 1125 ml  Net -565 ml      Physical Exam    General: Well appearing. No  resp difficulty. HEENT: Normal Neck: Supple. JVP 5-6. Carotids 2+ bilat; no bruits. No thyromegaly or nodule noted. Cor: PMI nondisplaced. RRR, No M/G/R noted Lungs: CTAB, normal effort. Abdomen: Soft, non-tender, non-distended, no HSM. No bruits or masses. +BS  Extremities: No cyanosis, clubbing, or rash. R and LLE no edema.  Neuro: Alert & orientedx3, cranial nerves grossly intact. moves all 4 extremities w/o difficulty. Affect pleasant   Telemetry   N/A  Labs    CBC Recent Labs    07/19/17 0646  WBC 10.2  NEUTROABS 4.9  HGB 14.6  HCT 43.8  MCV 92.8  PLT 311   Basic Metabolic Panel Recent Labs    40/98/1112/20/18 0646  NA 138  K 4.0  CL 105  CO2 26  GLUCOSE 121*  BUN 14  CREATININE 0.87  CALCIUM 9.3   Liver Function Tests No results for input(s): AST, ALT, ALKPHOS, BILITOT, PROT, ALBUMIN in the last 72 hours. No results for input(s): LIPASE, AMYLASE in the last 72 hours. Cardiac Enzymes No results for input(s): CKTOTAL, CKMB, CKMBINDEX, TROPONINI in the last 72 hours.  BNP: BNP (last 3 results) No results for input(s): BNP in the last 8760 hours.  ProBNP (last 3 results) No results for input(s): PROBNP in the last 8760 hours.   Davies-Dimer No results  for input(s): DDIMER in the last 72 hours. Hemoglobin A1C No results for input(s): HGBA1C in the last 72 hours. Fasting Lipid Panel No results for input(s): CHOL, HDL, LDLCALC, TRIG, CHOLHDL, LDLDIRECT in the last 72 hours. Thyroid Function Tests No results for input(s): TSH, T4TOTAL, T3FREE, THYROIDAB in the last 72 hours.  Invalid input(s): FREET3  Other results:   Imaging    No results found.   Medications:     Scheduled Medications: . atorvastatin  80 mg Oral q1800  . carvedilol  3.125 mg Oral BID WC  . digoxin  0.125 mg Oral Daily  . fenofibrate  160 mg Oral Daily  . folic acid  1 mg Oral Daily  . hydrocortisone  25 mg Rectal BID  . losartan  25 mg Oral Daily  . multivitamin with minerals  1  tablet Oral Daily  . nicotine  14 mg Transdermal Daily  . spironolactone  12.5 mg Oral QHS  . thiamine  100 mg Oral Daily  . Warfarin - Pharmacist Dosing Inpatient   Does not apply q1800    Infusions:   PRN Medications: acetaminophen, alum & mag hydroxide-simeth, bisacodyl, diphenhydrAMINE, guaiFENesin-dextromethorphan, methocarbamol, polyethylene glycol, prochlorperazine **OR** prochlorperazine **OR** prochlorperazine, sodium phosphate, sorbitol, traZODone    Patient Profile   Angel Davies is a 46 y.o. male with PMH of HTN, ADHD, obesity, anxiety, and depression.   Presented 07/02/17 with CVA. Found to have systolic CHF by echo.  UDS notably + for cocaine.  Assessment/Plan   1. 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 - With EF < 30% and LV thrombus will need AC. - Now on coumadin. INR 2.37. Discussed with Pharm-Davies.  - Appreciate CIR management of patient.   2. Acute systolic CHF due to ICM.  - Echo 07/02/17 with LVEF 20-25%, grade 2 DD, Trivial MR, Trivial TR - cMRI EF 18% - SBPs 100-120s. Volume status stable on exam.  - Continue spiro 12.5 mg daily. BMET stable today.   - Continue losartan 25 mg daily - Continue digoxin 0.125 mg daily.  - Continue coreg 3.125 mg BID.  BP too low to titrate. Hold meds for BP < 90  - [facility] as BP tolerates.  - Will need repeat echo 3 months after med optimization for ICD consideration.   3. CAD - LHC 07/05/17 with severe CAD with left-dominant system and occluded mLAD. - Mostly distal CAD. No good targets for CABG or PCI. Continue medical therapy. Based on scar pattern on MRI, We are hopeful EF will improve at least moderately   - Continue ASA and statin. No s/s of ischemia.     4. Cocaine abuse - Stressed importance of complete cessation moving forward. He remains motivated to change when we have spoken  about this.   5. Alcohol abuse - Stressed importance of complete cessation moving forward. He remains motivated to change when we have spoken about this.   6. HLD - Continue atorvastatin 80 mg daily.  - Continue  fenofibrate. No change.  BP remains too soft to titrate meds further.  Will continue these meds until more stable in the outpatient setting.   Currently planned for discharge 07/27/17. Will make sure he has HF clinic follow up in place if he goes sooner.   Length of Stay: [address]13  [name], PA-C  07/19/2017, 9:26 AM  Advanced Heart Failure Team Pager [phone] (M-F;  7a - 4p)  Please contact [facility] for night-coverage after hours (4p -7a ) and weekends on [email or web address]  Patient seen and examined with the above-signed Advanced Practice Provider and/or Housestaff. I personally reviewed laboratory data, imaging studies and relevant notes. I independently examined the patient and formulated the important aspects of the plan. I have edited the note to reflect any of my changes or salient points. I have personally discussed the plan with the patient and/or family.  Doing very well. No HF symptoms. On good regimen. Will continue.  Functional status improving with rehab.   CAD stable. No angina.   Arvilla Meres, MD  1:24 PM

## 2017-07-19 NOTE — Progress Notes (Signed)
Occupational Therapy Session Note  Patient Details  Name: Angel Davies MRN: [id number] Date of Birth: [birth year]  Today's Date: 07/19/2017 OT Individual Time: 6256-3893 OT Individual Time Calculation (min): 30 min    Short Term Goals: Week 2:  OT Short Term Goal 1 (Week 2): Pt will transfer to toilet/BSC with supervision and LRAD OT Short Term Goal 2 (Week 2): Pt will demonstrate proficiency in L UE self ROM OT Short Term Goal 3 (Week 2): Pt ambulate in room with LRAD  to collect clothing for BADL tasks with Min A  Skilled Therapeutic Interventions/Progress Updates:    Pt presents sitting in w/c upon arrival with no c/o pain ready for OT tx session. Pt ambulates using quad cane to/from therapy gym with close S throughout. Pt engages in seated fine motor activity using peg board with focus on controlled movements, translation of 2-3 pieces in hand. Pt completes with min verbal cues throughout, intermittently using RUE as gross stabilizer during activity completion. Pt completes 3 puzzles with two dropped pieces, able to pick up pieces from floor using RUE with Min steadying assist for maintaining balance. Pt returns to room in manner described above, left seated in w/c with needs met.   Therapy Documentation Precautions:  Precautions Precautions: Fall Restrictions Weight Bearing Restrictions: No    See Function Navigator for Current Functional Status.   Therapy/Group: Individual Therapy  Raymondo Band 07/19/2017, 3:58 PM

## 2017-07-19 NOTE — Progress Notes (Signed)
ANTICOAGULATION CONSULT NOTE - Follow Up Consult  Pharmacy Consult for warfarin Indication: stroke and LV thrombus  No Known Allergies  Patient Measurements: Height: 5\' 9"  (175.3 cm) Weight: 199 lb 11.8 oz (90.6 kg) IBW/kg (Calculated) : 70.7  Vital Signs: Temp: 97.9 F (36.6 C) (12/20 0500) Temp Source: Oral (12/20 0500) BP: 102/68 (12/20 0500) Pulse Rate: 83 (12/20 0500)  Labs: Recent Labs    07/17/17 0640 07/18/17 0713 07/19/17 0646  HGB  --   --  14.6  HCT  --   --  43.8  PLT  --   --  311  LABPROT 32.2* 31.3* 25.7*  INR 3.17 3.04 2.37  CREATININE  --   --  0.87    Estimated Creatinine Clearance: 115.6 mL/min (by C-G formula based on SCr of 0.87 mg/dL).  Assessment: 46 y/o M s/p acute embolic stroke, also found to have LV thrombus. Now on warfarin for anticoagulation. Patient seems to be very sensitive to warfarin INR therapeutic 2.37 today. CBC has been stable, no reports of bleeding.    Goal of Therapy:  INR goal 2-3 Monitor platelets by anticoagulation protocol: Yes   Plan:  Repeat warfarin 1 mg x 2 today Monitor daily INR, s/sx of bleed, PO intake, DDI   Bayard HuggerMei Cristhian Vanhook, PharmD, BCPS  Clinical Pharmacist  Pager: [phone]   07/19/2017 1:46 PM

## 2017-07-19 NOTE — Progress Notes (Signed)
Physical Therapy Session Note  Patient Details  Name: Angel Davies MRN: [id number] Date of Birth: 01-Feb-1971  Today's Date: 07/19/2017 PT Individual Time: 1515-1630 PT Individual Time Calculation (min): 75 min   Short Term Goals: Week 2:  PT Short Term Goal 1 (Week 2): =LTGs due to ELOS  Skilled Therapeutic Interventions/Progress Updates:    no c/o pain.  Session focus on NMR for LUE/LLE/trunk, balance, and activity tolerance.   Pt ambulates to therapy gym with [facility] and supervision.  Hook lying trunk rotation stretch bilaterally 3x30 seconds each, heel cord stretch 2x30 seconds in seated position.  Dynamic sitting balance on dynadisk with LEs unsupported and crossed for increased core challenge during ball tap with 5# dowel rod 2 trials to fatigue.  UE strengthening NMR (L) with sit<>stand with shoulder press, and seated trunk rotation with 4# ball.  Nustep x8 minutes with 4 extremities at level 4, focus on reciprocal stepping pattern retraining, trunk rotation, and maintaining LUE grip.  Pt returned to room at end of session, ambulating with no AD and close supervision/min guard.  Pt left seated EOB with call bell in reach and needs met.   Therapy Documentation Precautions:  Precautions Precautions: Fall Restrictions Weight Bearing Restrictions: No   See Function Navigator for Current Functional Status.   Therapy/Group: Individual Therapy  [name] 07/19/2017, 6:20 PM

## 2017-07-19 NOTE — Progress Notes (Signed)
Recreational Therapy Session Note  Patient Details  Name: Angel Davies MRN: [id number] Date of Birth: 07/10/1971 Today's Date: 07/19/2017  Pain: no c/o Skilled Therapeutic Interventions/Progress Updates: Pt participated in community reintegration/outing to [employer] at min assist ambulatory level using SBQC.  Goals focused on safe community mobility, identification & negotiation of obstacles, accessing public restroom, energy conservation techniques/education.  See outing goal sheet in shadow chart for full details.   Joury Allcorn 07/19/2017, 9:33 AM

## 2017-07-19 NOTE — Progress Notes (Signed)
Recreational Therapy Discharge Summary Patient Details  Name: Angel Davies MRN: [id number] Date of Birth: 09/01/1970 Today's Date: 07/19/2017 Comments on progress toward goals: Pt has made great progress toward goals and is ready for discharge home with wife.  TR session/educaiton focused on community reintegration at ambulatory level using SBQC.    Pt requires verbal cuing for safety/impulsivity.  Goals met. Reasons for discharge: discharge from hospital Patient/family agrees with progress made and goals achieved: Yes  , 07/19/2017, 11:43 AM     

## 2017-07-19 NOTE — Progress Notes (Signed)
Subjective/Complaints: Patient seen lying in bed this morning. He states he slept well overnight. He requests when necessary pain medications.  ROS: Denies nausea, vomiting, diarrhea, shortness of breath or chest pain   Objective: Vital Signs: Blood pressure 102/68, pulse 83, temperature 97.9 F (36.6 C), temperature source Oral, resp. rate 16, height _0  (1.753 m), weight 90.6 kg (199 lb 11.8 oz), SpO2 100 %. No results found. Results for orders placed or performed during the hospital encounter of 07/06/17 (from the past 72 hour(s))  Protime-INR     Status: Abnormal   Collection Time: 07/17/17  6:40 AM  Result Value Ref Range   Prothrombin Time 32.2 (H) 11.4 - 15.2 seconds   INR 3.17   Protime-INR     Status: Abnormal   Collection Time: 07/18/17  7:13 AM  Result Value Ref Range   Prothrombin Time 31.3 (H) 11.4 - 15.2 seconds   INR 3.04   Protime-INR     Status: Abnormal   Collection Time: 07/19/17  6:46 AM  Result Value Ref Range   Prothrombin Time 25.7 (H) 11.4 - 15.2 seconds   INR 2.37   CBC with Differential/Platelet     Status: Abnormal   Collection Time: 07/19/17  6:46 AM  Result Value Ref Range   WBC 10.2 4.0 - 10.5 K/uL   RBC 4.72 4.22 - 5.81 MIL/uL   Hemoglobin 14.6 13.0 - 17.0 g/dL   HCT 43.8 39.0 - 52.0 %   MCV 92.8 78.0 - 100.0 fL   MCH 30.9 26.0 - 34.0 pg   MCHC 33.3 30.0 - 36.0 g/dL   RDW 13.8 11.5 - 15.5 %   Platelets 311 150 - 400 K/uL   Neutrophils Relative % 48 %   Neutro Abs 4.9 1.7 - 7.7 K/uL   Lymphocytes Relative 40 %   Lymphs Abs 4.1 (H) 0.7 - 4.0 K/uL   Monocytes Relative 10 %   Monocytes Absolute 1.0 0.1 - 1.0 K/uL   Eosinophils Relative 2 %   Eosinophils Absolute 0.2 0.0 - 0.7 K/uL   Basophils Relative 0 %   Basophils Absolute 0.0 0.0 - 0.1 K/uL  Basic metabolic panel     Status: Abnormal   Collection Time: 07/19/17  6:46 AM  Result Value Ref Range   Sodium 138 135 - 145 mmol/L   Potassium 4.0 3.5 - 5.1 mmol/L   Chloride 105 101 - 111  mmol/L   CO2 26 22 - 32 mmol/L   Glucose, Bld 121 (H) 65 - 99 mg/dL   BUN 14 6 - 20 mg/dL   Creatinine, Ser 0.87 0.61 - 1.24 mg/dL   Calcium 9.3 8.9 - 10.3 mg/dL   GFR calc non Af Amer >60 >60 mL/min   GFR calc Af Amer >60 >60 mL/min    Comment: (NOTE) The eGFR has been calculated using the CKD EPI equation. This calculation has not been validated in all clinical situations. eGFR's persistently <60 mL/min signify possible Chronic Kidney Disease.    Anion gap 7 5 - 15    Gen NAD. Vital signs reviewed.  HEENT: normal. Atraumatic Cardio: RRR. No JVD  Resp: CTA Bilaterally. Normal effort  GI: BS positive and ND Musc/Skel:  No tenderness. No edema.  Neuro: Alert and oriented Motor: LUE: 4-/5 proximal to distal  LLE: HF, KE 4+/5, ADF/PF 1/5, wiggles toes  (stable) Skin:   Intact. Warm and dry  Assessment/Plan: 1. 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 Physiatrist is providing close team supervision and 24 hour management of active medical problems listed below. Physiatrist and rehab team continue to assess barriers to discharge/monitor patient progress toward functional and medical goals. FIM: Function - Bathing Bathing activity did not occur: Refused Position: Shower Body parts bathed by patient: Left arm, Chest, Abdomen, Front perineal area, Buttocks, Left upper leg, Right upper leg, Right lower leg, Left lower leg, Right arm Body parts bathed by helper: Left lower leg, Right lower leg, Right arm, Back Bathing not applicable: Back Assist Level: Supervision or verbal cues  Function- Upper Body Dressing/Undressing What is the patient wearing?: Pull over shirt/dress Pull over shirt/dress - Perfomed by patient: Thread/unthread right sleeve, Thread/unthread left sleeve, Put head through opening, Pull shirt over trunk Pull over shirt/dress - Perfomed  by helper: Thread/unthread left sleeve Assist Level: Supervision or verbal cues Function - Lower Body Dressing/Undressing What is the patient wearing?: Socks, Shoes, Underwear, Pants Position: Sitting EOB Underwear - Performed by patient: Thread/unthread right underwear leg, Thread/unthread left underwear leg, Pull underwear up/down Underwear - Performed by helper: Thread/unthread left underwear leg, Pull underwear up/down Pants- Performed by patient: Thread/unthread right pants leg, Thread/unthread left pants leg, Pull pants up/down, Fasten/unfasten pants Pants- Performed by helper: Fasten/unfasten pants Non-skid slipper socks- Performed by helper: Don/doff right sock, Don/doff left sock Socks - Performed by patient: Don/doff right sock, Don/doff left sock Socks - Performed by helper: Don/doff right sock, Don/doff left sock Shoes - Performed by patient: Don/doff right shoe Shoes - Performed by helper: Fasten right, Fasten left, Don/doff left shoe Assist for footwear: Supervision/touching assist Assist for lower body dressing: Touching or steadying assistance (Pt > 75%)  Function - Toileting Toileting activity did not occur: (no BM, used urinal) Toileting steps completed by patient: Adjust clothing prior to toileting, Performs perineal hygiene, Adjust clothing after toileting Toileting steps completed by helper: Adjust clothing prior to toileting, Adjust clothing after toileting Toileting Assistive Devices: Grab bar or rail Assist level: Supervision or verbal cues  Function - Air cabin crew transfer activity did not occur: N/A Toilet transfer assistive device: Grab bar Assist level to toilet: Touching or steadying assistance (Pt > 75%) Assist level from toilet: Touching or steadying assistance (Pt > 75%)  Function - Chair/bed transfer Chair/bed transfer activity did not occur: N/A Chair/bed transfer method: Stand pivot Chair/bed transfer assist level: Supervision or verbal  cues Chair/bed transfer assistive device: Armrests, Cane, Orthosis Chair/bed transfer details: Verbal cues for precautions/safety, Manual facilitation for weight shifting  Function - Locomotion: Wheelchair Will patient use wheelchair at discharge?: Yes Type: Manual Max wheelchair distance: 75 Assist Level: Supervision or verbal cues Assist Level: Supervision or verbal cues Wheel 150 feet activity did not occur: Safety/medical concerns Turns around,maneuvers to table,bed, and toilet,negotiates 3% grade,maneuvers on rugs and over doorsills: No Function - Locomotion: Ambulation Assistive device: Orthosis, Cane-quad Max distance: 200 Assist level: Touching or steadying assistance (Pt > 75%) Assist level: Touching or steadying assistance (Pt > 75%) Walk 50 feet with 2 turns activity did not occur: Safety/medical concerns Assist level: Touching or steadying assistance (Pt > 75%) Walk 150 feet activity did not occur: Safety/medical concerns Assist level: Touching or steadying assistance (Pt > 75%) Walk 10 feet on uneven surfaces activity did not occur: Safety/medical concerns  Function - Comprehension Comprehension: Auditory Comprehension assist level: Follows complex conversation/direction with no assist  Function - Expression Expression: Verbal Expression assist level: Expresses complex ideas: With  no assist  Function - Social Interaction Social Interaction assist level: Interacts appropriately with others - No medications needed.  Function - Problem Solving Problem solving assist level: Solves complex problems: Recognizes & self-corrects  Function - Memory Memory assist level: More than reasonable amount of time Patient normally able to recall (first 3 days only): Current season, Location of own room, Staff names and faces, That he or she is in a hospital  Medical Problem List and Plan:  1. 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 DVT Prophylaxis/Anticoagulation: Pharmaceutical:    INR therapeutic on 12/20 3. Pain Management: tylenol prn    When necessary Robaxin added for muscle tightness on 12/20 4. Mood: LCSW to follow for evaluation and support. Neuropsychology follow-up with the patient  5. Neuropsych: This patient is capable of making decisions on his own behalf.  6. Skin/Wound Care: Routine pressure relief measures.  7. Fluids/Electrolytes/Nutrition: Monitor I/O.    BMP within acceptable range on 12/20 8. Severe CAD with CM: Treated medically with ASA, statin , no angina  9. Acute systolic CHF: Was started spironolactone and digoxin on 12/7. Cardiology to follow, avoid hypotension/hypoperfusion. No clinical signs of failure 12/16     [facility] Weights   07/17/17 0549 07/18/17 0611 07/19/17 0500  Weight: 89 kg (196 lb 3.4 oz) 89.1 kg (196 lb 8.5 oz) 90.6 kg (199 lb 11.8 oz)    Vitals:   07/18/17 1300 07/19/17 0500  BP: 117/68 102/68  Pulse: 90 83  Resp: 16 16  Temp: 98.2 F (36.8 C) 97.9 F (36.6 C)  SpO2: 97% 100%   10.  Apical thrombus cont warfarin 11. Prediabetes: Hgb A1C- 6.0. Will have dietitian educate patient on CM diet--monitor BS for trends.    Relatively controlled on 12/20 12. Polysubstance abuse: Has been counseled on importance of alcohol, cocaine and THC cessation-appreciate Neuropsych consult 13. Leukocytosis: Resolved   WBCs 10.2 on 12/20   Cont to monitor 14. Hemorrhoids    Anusol ordered on 12/17   Improving   LOS (Days) 13 A FACE TO FACE EVALUATION WAS PERFORMED  Blaklee Shores [name] 07/19/2017, 8:24 AM

## 2017-07-20 ENCOUNTER — Inpatient Hospital Stay (HOSPITAL_COMMUNITY): Payer: Managed Care, Other (non HMO) | Admitting: Occupational Therapy

## 2017-07-20 ENCOUNTER — Inpatient Hospital Stay (HOSPITAL_COMMUNITY): Payer: Managed Care, Other (non HMO) | Admitting: Physical Therapy

## 2017-07-20 LAB — PROTIME-INR
INR: 2.05
Prothrombin Time: 23 seconds — ABNORMAL HIGH (ref 11.4–15.2)

## 2017-07-20 MED ORDER — WARFARIN SODIUM 2.5 MG PO TABS
2.5000 mg | ORAL_TABLET | Freq: Once | ORAL | Status: AC
  Administered 2017-07-20: 2.5 mg via ORAL
  Filled 2017-07-20: qty 1

## 2017-07-20 NOTE — Progress Notes (Signed)
Occupational Therapy Weekly Progress Note  Patient Details  Name: Angel Davies MRN: [id number] Date of Birth: 02-14-1971  Beginning of progress report period: July 07, 2017 End of progress report period: July 20, 2017  Today's Date: 07/20/2017 OT Individual Time: 1015-1130 OT Individual Time Calculation (min): 75 min    Patient has met 3 of 3 short term goals.  Pt is making steady progress with OT treatments at this time.  He is able to ambulate in the room and complete transfers with quad cane and min/supervision. LUE function continues to improve to a supervision level for bathing and dressing tasks.  He continues to need increased time for fastening belt and pants as well as for tying shoes.  Feel he is on target to reach modified independent level goals for discharge home next week.  Will continue with current OT treatment POC.     Patient continues to demonstrate the following deficits: muscle weakness, impaired timing and sequencing, abnormal tone, unbalanced muscle activation and ataxia and decreased standing balance, hemiplegia and decreased balance strategies and therefore will continue to benefit from skilled OT intervention to enhance overall performance with BADL.  Patient progressing toward long term goals..  Continue plan of care.  OT Short Term Goals Week 2:  OT Short Term Goal 1 (Week 2): Pt will transfer to toilet/BSC with supervision and LRAD OT Short Term Goal 1 - Progress (Week 2): Met OT Short Term Goal 2 (Week 2): Pt will demonstrate proficiency in L UE self ROM OT Short Term Goal 2 - Progress (Week 2): Met OT Short Term Goal 3 (Week 2): Pt ambulate in room with LRAD  to collect clothing for BADL tasks with Min A OT Short Term Goal 3 - Progress (Week 2): Met Week 3:  OT Short Term Goal 1 (Week 3): STG=LTG 2.2 ELOS  Skilled Therapeutic Interventions/Progress Updates:    OT treatment session focused on L NMR, fine motor coordination, UB strengthening, and  functional ambulation. B UE strength/coordination with wc propulsion. Provided pt with handouts regarding home fine motor program and theraputty exercises. Focus on thumb opposition, palmer translation, rotation, rotation,  pinch, and grip using graded peg board, playing cards, and pencil walking. Pt then ambulated with quad cane with steady assist and improved L LE control. Intermittent cues needed for toe clearance with fatigue. Pt left seated EOB at end of session with needs met.   See Function Navigator for Current Functional Status.   Therapy/Group: Individual Therapy  [name] 07/20/2017, 11:41 AM

## 2017-07-20 NOTE — Progress Notes (Signed)
Social Work Patient ID: Angel Davies, male   DOB: 09-07-1970, 46 y.o.   MRN: [id number]   CSW met with pt and left pt's wife a message to update them on team conference discussion.  Pt is pleased that his d/c date 07-27-17 is still the same.  Pt is encouraged by his progress, especially his improvement in his left arm/hand.  Pt stated he can get to outpt rehab and CSW will arrange this for him at [facility].  He also needs a small base quad cane and CSW made this referral through [facility] and cane will be provided by [place].  CSW remains available, as needed.

## 2017-07-20 NOTE — Progress Notes (Signed)
Physical Therapy Session Note  Patient Details  Name: DOMNIC VANTOL MRN: [id number] Date of Birth: [birth year]  Today's Date: 07/20/2017 PT Individual Time: 1415-1515 PT Individual Time Calculation (min): 60 min   Short Term Goals: Week 2:  PT Short Term Goal 1 (Week 2): =LTGs due to ELOS  Skilled Therapeutic Interventions/Progress Updates:    no c/o pain.  Session focus on NMR for L knee control and LUE.  Pt ambulates to and from therapy gym with no AD with supervision<>min guard.  Occasionally requires verbal cues for attention to task with ambulation as LOB occurs when distracted.  NMR for balance and L knee control with alternating toe taps 2 trials to fatigue, LLE step ups 2 trials to fatigue, and LLE lateral step ups 2 trials to fatigue.  Pt requires supervision for toe taps, min guard for step ups, and mod assist for lateral step ups.  LUE NMR with red theraputty for gross motor and fine motor control retrieving beads from putty.  Pt returned to room at end of session and positioned with call bell in reach and needs met.   Therapy Documentation Precautions:  Precautions Precautions: Fall Restrictions Weight Bearing Restrictions: No   See Function Navigator for Current Functional Status.   Therapy/Group: Individual Therapy  [name] 07/20/2017, 5:15 PM

## 2017-07-20 NOTE — Progress Notes (Signed)
Subjective/Complaints: Patient seen sitting up in his chair this morning. He slept well overnight. He states he is using muscle disease he has never used before and as a result they are sore and requests medication. Reminded patient that medications were adjusted for him yesterday for the same purpose and he can ask for them as needed.  ROS: Denies nausea, vomiting, diarrhea, shortness of breath or chest pain   Objective: Vital Signs: Blood pressure 114/65, pulse 78, temperature 97.8 F (36.6 C), temperature source Oral, resp. rate 16, height '5\' 9"'$  (1.753 m), weight 89.1 kg (196 lb 6.9 oz), SpO2 96 %. No results found. Results for orders placed or performed during the hospital encounter of 07/06/17 (from the past 72 hour(s))  Protime-INR     Status: Abnormal   Collection Time: 07/18/17  7:13 AM  Result Value Ref Range   Prothrombin Time 31.3 (H) 11.4 - 15.2 seconds   INR 3.04   Protime-INR     Status: Abnormal   Collection Time: 07/19/17  6:46 AM  Result Value Ref Range   Prothrombin Time 25.7 (H) 11.4 - 15.2 seconds   INR 2.37   CBC with Differential/Platelet     Status: Abnormal   Collection Time: 07/19/17  6:46 AM  Result Value Ref Range   WBC 10.2 4.0 - 10.5 K/uL   RBC 4.72 4.22 - 5.81 MIL/uL   Hemoglobin 14.6 13.0 - 17.0 g/dL   HCT 43.8 39.0 - 52.0 %   MCV 92.8 78.0 - 100.0 fL   MCH 30.9 26.0 - 34.0 pg   MCHC 33.3 30.0 - 36.0 g/dL   RDW 13.8 11.5 - 15.5 %   Platelets 311 150 - 400 K/uL   Neutrophils Relative % 48 %   Neutro Abs 4.9 1.7 - 7.7 K/uL   Lymphocytes Relative 40 %   Lymphs Abs 4.1 (H) 0.7 - 4.0 K/uL   Monocytes Relative 10 %   Monocytes Absolute 1.0 0.1 - 1.0 K/uL   Eosinophils Relative 2 %   Eosinophils Absolute 0.2 0.0 - 0.7 K/uL   Basophils Relative 0 %   Basophils Absolute 0.0 0.0 - 0.1 K/uL  Basic metabolic panel     Status: Abnormal   Collection Time: 07/19/17  6:46 AM  Result Value Ref Range   Sodium 138 135 - 145 mmol/L   Potassium 4.0 3.5 - 5.1  mmol/L   Chloride 105 101 - 111 mmol/L   CO2 26 22 - 32 mmol/L   Glucose, Bld 121 (H) 65 - 99 mg/dL   BUN 14 6 - 20 mg/dL   Creatinine, Ser 0.87 0.61 - 1.24 mg/dL   Calcium 9.3 8.9 - 10.3 mg/dL   GFR calc non Af Amer >60 >60 mL/min   GFR calc Af Amer >60 >60 mL/min    Comment: (NOTE) The eGFR has been calculated using the CKD EPI equation. This calculation has not been validated in all clinical situations. eGFR's persistently <60 mL/min signify possible Chronic Kidney Disease.    Anion gap 7 5 - 15    Gen NAD. Vital signs reviewed.  HEENT: normal. Atraumatic Cardio: RRR. No JVD  Resp: CTA Bilaterally. Normal effort  GI: BS positive and ND Musc/Skel:  No tenderness. No edema.  Neuro: Alert and oriented Motor: LUE: 4-/5 proximal to distal  LLE: HF, KE 4+/5, ADF/PF 1/5, wiggles toes  (unchanged) Skin:   Intact. Warm and dry  Assessment/Plan: 1. 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 Physiatrist is providing close team supervision and 24 hour management of active medical problems listed below. Physiatrist and rehab team continue to assess barriers to discharge/monitor patient progress toward functional and medical goals. FIM: Function - Bathing Bathing activity did not occur: Refused Position: Shower Body parts bathed by patient: Left arm, Chest, Abdomen, Front perineal area, Buttocks, Left upper leg, Right upper leg, Right lower leg, Left lower leg, Right arm Body parts bathed by helper: Left lower leg, Right lower leg, Right arm, Back Bathing not applicable: Back Assist Level: Supervision or verbal cues  Function- Upper Body Dressing/Undressing What is the patient wearing?: Pull over shirt/dress Pull over shirt/dress - Perfomed by patient: Thread/unthread right sleeve, Thread/unthread left sleeve, Put head through opening, Pull shirt over  trunk Pull over shirt/dress - Perfomed by helper: Thread/unthread left sleeve Assist Level: Supervision or verbal cues Function - Lower Body Dressing/Undressing What is the patient wearing?: Socks, Shoes, Underwear, Pants Position: Sitting EOB Underwear - Performed by patient: Thread/unthread right underwear leg, Thread/unthread left underwear leg, Pull underwear up/down Underwear - Performed by helper: Thread/unthread left underwear leg, Pull underwear up/down Pants- Performed by patient: Thread/unthread right pants leg, Thread/unthread left pants leg, Pull pants up/down, Fasten/unfasten pants Pants- Performed by helper: Fasten/unfasten pants Non-skid slipper socks- Performed by helper: Don/doff right sock, Don/doff left sock Socks - Performed by patient: Don/doff right sock, Don/doff left sock Socks - Performed by helper: Don/doff right sock, Don/doff left sock Shoes - Performed by patient: Don/doff right shoe Shoes - Performed by helper: Fasten right, Fasten left, Don/doff left shoe Assist for footwear: Supervision/touching assist Assist for lower body dressing: Touching or steadying assistance (Pt > 75%)  Function - Toileting Toileting activity did not occur: (no BM, used urinal) Toileting steps completed by patient: Adjust clothing prior to toileting, Performs perineal hygiene, Adjust clothing after toileting Toileting steps completed by helper: Adjust clothing prior to toileting, Adjust clothing after toileting Toileting Assistive Devices: Grab bar or rail Assist level: Supervision or verbal cues  Function - Air cabin crew transfer activity did not occur: N/A Toilet transfer assistive device: Grab bar Assist level to toilet: Touching or steadying assistance (Pt > 75%) Assist level from toilet: Touching or steadying assistance (Pt > 75%)  Function - Chair/bed transfer Chair/bed transfer activity did not occur: N/A Chair/bed transfer method: Stand pivot Chair/bed  transfer assist level: Supervision or verbal cues Chair/bed transfer assistive device: Armrests, Cane, Orthosis Chair/bed transfer details: Verbal cues for precautions/safety, Manual facilitation for weight shifting  Function - Locomotion: Wheelchair Will patient use wheelchair at discharge?: Yes Type: Manual Max wheelchair distance: 75 Assist Level: Supervision or verbal cues Assist Level: Supervision or verbal cues Wheel 150 feet activity did not occur: Safety/medical concerns Turns around,maneuvers to table,bed, and toilet,negotiates 3% grade,maneuvers on rugs and over doorsills: No Function - Locomotion: Ambulation Assistive device: Orthosis, Cane-quad Max distance: 200 Assist level: Touching or steadying assistance (Pt > 75%) Assist level: Touching or steadying assistance (Pt > 75%) Walk 50 feet with 2 turns activity did not occur: Safety/medical concerns Assist level: Touching or steadying assistance (Pt > 75%) Walk 150 feet activity did not occur: Safety/medical concerns Assist level: Touching or steadying assistance (Pt > 75%) Walk 10 feet on uneven surfaces activity did not occur: Safety/medical concerns  Function - Comprehension Comprehension: Auditory Comprehension assist level: Follows complex conversation/direction with no assist  Function - Expression Expression: Verbal Expression assist level: Expresses complex ideas:  With no assist  Function - Social Interaction Social Interaction assist level: Interacts appropriately with others - No medications needed.  Function - Problem Solving Problem solving assist level: Solves complex problems: Recognizes & self-corrects  Function - Memory Memory assist level: More than reasonable amount of time Patient normally able to recall (first 3 days only): Current season, Location of own room, Staff names and faces, That he or she is in a hospital  Medical Problem List and Plan:  1. Functional deficits and left hemiparesis  secondary to acute right pontine infarct/subacute right temporal/occipital infarcts    Cont CIR  2. DVT Prophylaxis/Anticoagulation: Pharmaceutical:    INR therapeutic on 12/20, awaiting results today 3. Pain Management: tylenol prn    When necessary Robaxin added for muscle tightness on 12/20 4. Mood: LCSW to follow for evaluation and support. Neuropsychology follow-up with the patient  5. Neuropsych: This patient is capable of making decisions on his own behalf.  6. Skin/Wound Care: Routine pressure relief measures.  7. Fluids/Electrolytes/Nutrition: Monitor I/O.    BMP within acceptable range on 12/20 8. Severe CAD with CM: Treated medically with ASA, statin , no angina  9. Acute systolic CHF: Was started spironolactone and digoxin on 12/7. Cardiology to follow, avoid hypotension/hypoperfusion.    Appreciate Cards recs, reviewed, continue medications.     Filed Weights   07/18/17 0611 07/19/17 0500 07/20/17 0500  Weight: 89.1 kg (196 lb 8.5 oz) 90.6 kg (199 lb 11.8 oz) 89.1 kg (196 lb 6.9 oz)    Vitals:   07/19/17 1500 07/20/17 0500  BP: 121/77 114/65  Pulse: 77 78  Resp: 16 16  Temp: 98.2 F (36.8 C) 97.8 F (36.6 C)  SpO2: 98% 96%   10.  Apical thrombus cont warfarin 11. Prediabetes: Hgb A1C- 6.0. Will have dietitian educate patient on CM diet--monitor BS for trends.    Relatively controlled on 12/21 12. Polysubstance abuse: Has been counseled on importance of alcohol, cocaine and THC cessation-appreciate Neuropsych consult 13. Leukocytosis: Resolved   WBCs 10.2 on 12/20   Cont to monitor 14. Hemorrhoids    Anusol ordered on 12/17   Improving   LOS (Days) 14 A FACE TO FACE EVALUATION WAS PERFORMED  Chia Mowers [name] 07/20/2017, 8:33 AM

## 2017-07-20 NOTE — Progress Notes (Signed)
Physical Therapy Session Note  Patient Details  Name: ORVA GWALTNEY MRN: [id number] Date of Birth: July 19, 1971  Today's Date: 07/20/2017 PT Individual Time: 0900-1000 PT Individual Time Calculation (min): 60 min   Short Term Goals: Week 2: =LTGs due to ELOS  Skilled Therapeutic Interventions/Progress Updates:    no c/o pain.  Session focus on NMR via LE therex and gait training without AD.    Pt propelled w/c to therapy gym with supervision.  Transfers throughout session with supervision.  Pt completes BLE therex with 3# weights 2x15 each: bridging with adductor hold, hip flexion in hook lying, clam shells, hip abd/add in hook lying, and SLR.  PT provided verbal and tactile cues for correct form to target appropriate muscle with each exercise.  Hook lying trunk rotation stretch R and L 2x30 seconds each.    Gait training without device x300' with supervision, min guard on turns to L due to L LOB.  High level gait training through obstacle course focus on compliant surfaces with supervision, and min guard for stepping around obstacles.  Pt returned to room at end of session with call bell in reach and needs met.   Therapy Documentation Precautions:  Precautions Precautions: Fall Restrictions Weight Bearing Restrictions: No   See Function Navigator for Current Functional Status.   Therapy/Group: Individual Therapy  [name] 07/20/2017, 10:02 AM

## 2017-07-20 NOTE — Progress Notes (Signed)
ANTICOAGULATION CONSULT NOTE - Follow Up Consult  Pharmacy Consult for warfarin Indication: stroke and LV thrombus  No Known Allergies  Patient Measurements: Height: 5\' 9"  (175.3 cm) Weight: 196 lb 6.9 oz (89.1 kg) IBW/kg (Calculated) : 70.7  Vital Signs: Temp: 97.8 F (36.6 C) (12/21 0500) Temp Source: Oral (12/21 0500) BP: 114/65 (12/21 0500) Pulse Rate: 78 (12/21 0500)  Labs: Recent Labs    07/18/17 0713 07/19/17 0646 07/20/17 0749  HGB  --  14.6  --   HCT  --  43.8  --   PLT  --  311  --   LABPROT 31.3* 25.7* 23.0*  INR 3.04 2.37 2.05  CREATININE  --  0.87  --     Estimated Creatinine Clearance: 114.7 mL/min (by C-G formula based on SCr of 0.87 mg/dL).  Assessment: 46 y/o M s/p acute embolic stroke, also found to have LV thrombus. Now on warfarin for anticoagulation. Patient seems to be very sensitive to warfarin INR therapeutic 2.05 today. CBC has been stable, no reports of bleeding.    Goal of Therapy:  INR goal 2-3 Monitor platelets by anticoagulation protocol: Yes   Plan:  Repeat warfarin 2.5 mg x 1 today Monitor daily INR, s/sx of bleed, PO intake, DDI   Bayard HuggerMei Demaryius Imran, PharmD, BCPS  Clinical Pharmacist  Pager: [phone]   07/20/2017 2:15 PM

## 2017-07-20 NOTE — Plan of Care (Signed)
  RD consulted for nutrition education.  Lab Results  Component Value Date   HGBA1C 6.0 (H) 07/03/2017    RD provided "Heart Healthy Consistent Carbohydrates Nutrition Therapy" handout from the [employer] of [employer]. Discussed different food groups and their effects on blood sugar, emphasizing carbohydrate-containing foods. Provided list of carbohydrates and recommended serving sizes of common foods.  Discussed importance of controlled and consistent carbohydrate intake throughout the day. Provided examples of ways to balance meals/snacks and encouraged intake of high-fiber, whole grain complex carbohydrates. Discouraged high sodium, high fat foods. Discussed diabetic friendly drink options. Teach back method used.  Expect good compliance.  Body mass index is 29.01 kg/m. Pt meets criteria for overweight based on current BMI.  Current diet order is carbohydrate modified, patient is consuming approximately 100% of meals at this time. Labs and medications reviewed. No further nutrition interventions warranted at this time. RD contact information provided. If additional nutrition issues arise, please re-consult RD.  Roslyn SmilingStephanie Monnie Gudgel, MS, RD, LDN Pager # [phone] After hours/ weekend pager # [phone]

## 2017-07-21 ENCOUNTER — Inpatient Hospital Stay (HOSPITAL_COMMUNITY): Payer: Managed Care, Other (non HMO) | Admitting: Occupational Therapy

## 2017-07-21 LAB — PROTIME-INR
INR: 2.24
Prothrombin Time: 24.6 seconds — ABNORMAL HIGH (ref 11.4–15.2)

## 2017-07-21 MED ORDER — WARFARIN SODIUM 2 MG PO TABS
2.0000 mg | ORAL_TABLET | Freq: Once | ORAL | Status: AC
  Administered 2017-07-21: 2 mg via ORAL
  Filled 2017-07-21: qty 1

## 2017-07-21 NOTE — Progress Notes (Signed)
Subjective/Complaints: Patient seen lying in bed this morning. He states he slept well overnight. He denies complaints.  ROS: Denies nausea, vomiting, diarrhea, shortness of breath or chest pain   Objective: Vital Signs: Blood pressure 107/64, pulse 77, temperature 98 F (36.7 C), temperature source Oral, resp. rate 18, height '5\' 9"'$  (1.753 m), weight 89.6 kg (197 lb 8.5 oz), SpO2 95 %. No results found. Results for orders placed or performed during the hospital encounter of 07/06/17 (from the past 72 hour(s))  Protime-INR     Status: Abnormal   Collection Time: 07/19/17  6:46 AM  Result Value Ref Range   Prothrombin Time 25.7 (H) 11.4 - 15.2 seconds   INR 2.37   CBC with Differential/Platelet     Status: Abnormal   Collection Time: 07/19/17  6:46 AM  Result Value Ref Range   WBC 10.2 4.0 - 10.5 K/uL   RBC 4.72 4.22 - 5.81 MIL/uL   Hemoglobin 14.6 13.0 - 17.0 g/dL   HCT 43.8 39.0 - 52.0 %   MCV 92.8 78.0 - 100.0 fL   MCH 30.9 26.0 - 34.0 pg   MCHC 33.3 30.0 - 36.0 g/dL   RDW 13.8 11.5 - 15.5 %   Platelets 311 150 - 400 K/uL   Neutrophils Relative % 48 %   Neutro Abs 4.9 1.7 - 7.7 K/uL   Lymphocytes Relative 40 %   Lymphs Abs 4.1 (H) 0.7 - 4.0 K/uL   Monocytes Relative 10 %   Monocytes Absolute 1.0 0.1 - 1.0 K/uL   Eosinophils Relative 2 %   Eosinophils Absolute 0.2 0.0 - 0.7 K/uL   Basophils Relative 0 %   Basophils Absolute 0.0 0.0 - 0.1 K/uL  Basic metabolic panel     Status: Abnormal   Collection Time: 07/19/17  6:46 AM  Result Value Ref Range   Sodium 138 135 - 145 mmol/L   Potassium 4.0 3.5 - 5.1 mmol/L   Chloride 105 101 - 111 mmol/L   CO2 26 22 - 32 mmol/L   Glucose, Bld 121 (H) 65 - 99 mg/dL   BUN 14 6 - 20 mg/dL   Creatinine, Ser 0.87 0.61 - 1.24 mg/dL   Calcium 9.3 8.9 - 10.3 mg/dL   GFR calc non Af Amer >60 >60 mL/min   GFR calc Af Amer >60 >60 mL/min    Comment: (NOTE) The eGFR has been calculated using the CKD EPI equation. This calculation has not  been validated in all clinical situations. eGFR's persistently <60 mL/min signify possible Chronic Kidney Disease.    Anion gap 7 5 - 15  Protime-INR     Status: Abnormal   Collection Time: 07/20/17  7:49 AM  Result Value Ref Range   Prothrombin Time 23.0 (H) 11.4 - 15.2 seconds   INR 2.05     Gen NAD. Vital signs reviewed.  HEENT: normal. Atraumatic Cardio: RRR. No JVD  Resp: CTA Bilaterally. Normal effort  GI: BS positive and ND Musc/Skel:  No tenderness. No edema.  Neuro: Alert and oriented Motor: LUE: 4-/5 proximal to distal  LLE: HF, KE 4+/5, ADF/PF 1/5, wiggles toes  (stable) Skin:   Intact. Warm and dry  Assessment/Plan: 1. 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 Physiatrist is providing close team supervision and 24 hour management of active medical problems listed below. Physiatrist and rehab team continue to assess barriers to discharge/monitor patient progress  toward functional and medical goals. FIM: Function - Bathing Bathing activity did not occur: Refused Position: Shower Body parts bathed by patient: Left arm, Chest, Abdomen, Front perineal area, Buttocks, Left upper leg, Right upper leg, Right lower leg, Left lower leg, Right arm Body parts bathed by helper: Left lower leg, Right lower leg, Right arm, Back Bathing not applicable: Back Assist Level: Supervision or verbal cues  Function- Upper Body Dressing/Undressing What is the patient wearing?: Pull over shirt/dress Pull over shirt/dress - Perfomed by patient: Thread/unthread right sleeve, Thread/unthread left sleeve, Put head through opening, Pull shirt over trunk Pull over shirt/dress - Perfomed by helper: Thread/unthread left sleeve Assist Level: Supervision or verbal cues Function - Lower Body Dressing/Undressing What is the patient wearing?: Socks, Shoes, Underwear,  Pants Position: Sitting EOB Underwear - Performed by patient: Thread/unthread right underwear leg, Thread/unthread left underwear leg, Pull underwear up/down Underwear - Performed by helper: Thread/unthread left underwear leg, Pull underwear up/down Pants- Performed by patient: Thread/unthread right pants leg, Thread/unthread left pants leg, Pull pants up/down, Fasten/unfasten pants Pants- Performed by helper: Fasten/unfasten pants Non-skid slipper socks- Performed by helper: Don/doff right sock, Don/doff left sock Socks - Performed by patient: Don/doff right sock, Don/doff left sock Socks - Performed by helper: Don/doff right sock, Don/doff left sock Shoes - Performed by patient: Don/doff right shoe Shoes - Performed by helper: Fasten right, Fasten left, Don/doff left shoe Assist for footwear: Supervision/touching assist Assist for lower body dressing: Touching or steadying assistance (Pt > 75%)  Function - Toileting Toileting activity did not occur: (no BM, used urinal) Toileting steps completed by patient: Adjust clothing prior to toileting, Performs perineal hygiene, Adjust clothing after toileting Toileting steps completed by helper: Adjust clothing prior to toileting, Adjust clothing after toileting Toileting Assistive Devices: Grab bar or rail Assist level: Supervision or verbal cues  Function - Air cabin crew transfer activity did not occur: N/A Toilet transfer assistive device: Grab bar Assist level to toilet: Touching or steadying assistance (Pt > 75%) Assist level from toilet: Touching or steadying assistance (Pt > 75%)  Function - Chair/bed transfer Chair/bed transfer activity did not occur: N/A Chair/bed transfer method: Stand pivot Chair/bed transfer assist level: Supervision or verbal cues Chair/bed transfer assistive device: Armrests, Cane, Orthosis Chair/bed transfer details: Verbal cues for precautions/safety, Manual facilitation for weight  shifting  Function - Locomotion: Wheelchair Will patient use wheelchair at discharge?: Yes Type: Manual Max wheelchair distance: 75 Assist Level: Supervision or verbal cues Assist Level: Supervision or verbal cues Wheel 150 feet activity did not occur: Safety/medical concerns Turns around,maneuvers to table,bed, and toilet,negotiates 3% grade,maneuvers on rugs and over doorsills: No Function - Locomotion: Ambulation Assistive device: Orthosis, Cane-quad Max distance: 200 Assist level: Touching or steadying assistance (Pt > 75%) Assist level: Touching or steadying assistance (Pt > 75%) Walk 50 feet with 2 turns activity did not occur: Safety/medical concerns Assist level: Touching or steadying assistance (Pt > 75%) Walk 150 feet activity did not occur: Safety/medical concerns Assist level: Touching or steadying assistance (Pt > 75%) Walk 10 feet on uneven surfaces activity did not occur: Safety/medical concerns  Function - Comprehension Comprehension: Auditory Comprehension assist level: Follows complex conversation/direction with no assist  Function - Expression Expression: Verbal Expression assist level: Expresses complex ideas: With no assist  Function - Social Interaction Social Interaction assist level: Interacts appropriately with others - No medications needed.  Function - Problem Solving Problem solving assist level: Solves complex problems: Recognizes & self-corrects  Function - Memory Memory  assist level: More than reasonable amount of time Patient normally able to recall (first 3 days only): Current season, Location of own room, Staff names and faces, That he or she is in a hospital  Medical Problem List and Plan:  1. 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 DVT Prophylaxis/Anticoagulation: Pharmaceutical:    INR therapeutic on 12/21 3. Pain Management: tylenol prn     When necessary Robaxin added for muscle tightness on 12/20 4. Mood: LCSW to follow for evaluation and support. Neuropsychology follow-up with the patient  5. Neuropsych: This patient is capable of making decisions on his own behalf.  6. Skin/Wound Care: Routine pressure relief measures.  7. Fluids/Electrolytes/Nutrition: Monitor I/O.    BMP within acceptable range on 12/20 8. Severe CAD with CM: Treated medically with ASA, statin , no angina  9. Acute systolic CHF: Was started spironolactone and digoxin on 12/7. Cardiology to follow, avoid hypotension/hypoperfusion.    Appreciate Cards recs, reviewed, continue medications.     Filed Weights   07/19/17 0500 07/20/17 0500 07/21/17 0644  Weight: 90.6 kg (199 lb 11.8 oz) 89.1 kg (196 lb 6.9 oz) 89.6 kg (197 lb 8.5 oz)    Stable  Vitals:   07/20/17 1546 07/21/17 0644  BP: (!) 110/57 107/64  Pulse: 90 77  Resp: 18 18  Temp: 98.9 F (37.2 C) 98 F (36.7 C)  SpO2: 95% 95%   10.  Apical thrombus cont warfarin 11. Prediabetes: Hgb A1C- 6.0. Will have dietitian educate patient on CM diet--monitor BS for trends.    Relatively controlled on 12/21 12. Polysubstance abuse: Has been counseled on importance of alcohol, cocaine and THC cessation-appreciate Neuropsych consult 13. Leukocytosis: Resolved   WBCs 10.2 on 12/20   Cont to monitor 14. Hemorrhoids    Anusol ordered on 12/17   Improving   LOS (Days) 15 A FACE TO FACE EVALUATION WAS PERFORMED  Debbi Strandberg [name] 07/21/2017, 7:23 AM

## 2017-07-21 NOTE — Progress Notes (Signed)
Occupational Therapy Session Note  Patient Details  Name: Angel Davies MRN: [id number] Date of Birth: Apr 06, 1971  Today's Date: 07/21/2017 OT Individual Time: 1005-1045 OT Individual Time Calculation (min): 40 min    Short Term Goals: Week 3:  OT Short Term Goal 1 (Week 3): STG=LTG 2.2 ELOS  Skilled Therapeutic Interventions/Progress Updates:    OT treatment session focused on L fine motor coordination and functional ambulation. Pt ambulated to therapy gym without AD and min guard A. L FMC using nuts, bolts, and washers activity. Cues for normal movement patterns from shoulder. Focused on rotation and improved use of L thumb. Pt ambulated back to room at end of session and left seated in recliner with needs met and spouse present.   Therapy Documentation Precautions:  Precautions Precautions: Fall Restrictions Weight Bearing Restrictions: No  See Function Navigator for Current Functional Status.   Therapy/Group: Individual Therapy  [name] 07/21/2017, 11:01 AM

## 2017-07-21 NOTE — Progress Notes (Signed)
ANTICOAGULATION CONSULT NOTE - Follow Up Consult  Pharmacy Consult for Coumadin Indication: stroke and LV thrombus  No Known Allergies  Patient Measurements: Height: 5\' 9"  (175.3 cm) Weight: 197 lb 8.5 oz (89.6 kg) IBW/kg (Calculated) : 70.7  Vital Signs: Temp: 98 F (36.7 C) (12/22 0644) Temp Source: Oral (12/22 0644) BP: 107/64 (12/22 0644) Pulse Rate: 77 (12/22 0644)  Labs: Recent Labs    07/19/17 0646 07/20/17 0749 07/21/17 0649  HGB 14.6  --   --   HCT 43.8  --   --   PLT 311  --   --   LABPROT 25.7* 23.0* 24.6*  INR 2.37 2.05 2.24  CREATININE 0.87  --   --     Estimated Creatinine Clearance: 115 mL/min (by C-G formula based on SCr of 0.87 mg/dL).  Assessment: 46 y/o M s/p acute embolic stroke, also found to have LV thrombus. Now on warfarin for anticoagulation. Patient seems to be very sensitive to warfarin.  INR therapeutic 2.24 today. CBC has been stable, no reports of bleeding.    Goal of Therapy:  INR goal 2-3 Monitor platelets by anticoagulation protocol: Yes   Plan:  Coumadin 2 mg PO x 1 tonight Monitor daily INR/CBC, s/sx of bleed, PO intake, DDI   Diana L. Marcy Salvoaymond, PharmD, MS PGY1 Pharmacy Resident [phone]

## 2017-07-22 LAB — CBC
HEMATOCRIT: 47.9 % (ref 39.0–52.0)
HEMOGLOBIN: 16 g/dL (ref 13.0–17.0)
MCH: 30.6 pg (ref 26.0–34.0)
MCHC: 33.4 g/dL (ref 30.0–36.0)
MCV: 91.6 fL (ref 78.0–100.0)
Platelets: 352 10*3/uL (ref 150–400)
RBC: 5.23 MIL/uL (ref 4.22–5.81)
RDW: 13.3 % (ref 11.5–15.5)
WBC: 10.7 10*3/uL — ABNORMAL HIGH (ref 4.0–10.5)

## 2017-07-22 LAB — PROTIME-INR
INR: 2.13
Prothrombin Time: 23.7 seconds — ABNORMAL HIGH (ref 11.4–15.2)

## 2017-07-22 MED ORDER — WARFARIN SODIUM 2 MG PO TABS
2.0000 mg | ORAL_TABLET | Freq: Once | ORAL | Status: AC
  Administered 2017-07-22: 2 mg via ORAL
  Filled 2017-07-22: qty 1

## 2017-07-22 NOTE — Progress Notes (Signed)
ANTICOAGULATION CONSULT NOTE - Follow Up Consult  Pharmacy Consult for Coumadin Indication: stroke and LV thrombus  No Known Allergies  Patient Measurements: Height: 5\' 9"  (175.3 cm) Weight: 195 lb 15.8 oz (88.9 kg) IBW/kg (Calculated) : 70.7  Vital Signs: Temp: 98.1 F (36.7 C) (12/23 0630) Temp Source: Oral (12/23 0630) BP: 108/74 (12/23 0630) Pulse Rate: 81 (12/23 0630)  Labs: Recent Labs    07/20/17 0749 07/21/17 0649 07/22/17 1148  HGB  --   --  16.0  HCT  --   --  47.9  PLT  --   --  352  LABPROT 23.0* 24.6* 23.7*  INR 2.05 2.24 2.13    Estimated Creatinine Clearance: 114.6 mL/min (by C-G formula based on SCr of 0.87 mg/dL).  Assessment: 46 y/o M s/p acute embolic stroke, also found to have LV thrombus. Now on warfarin for anticoagulation. Patient seems to be very sensitive to warfarin.  INR therapeutic 2.13 today. CBC wnl and stable, no reports of bleeding.    Goal of Therapy:  INR goal 2-3 Monitor platelets by anticoagulation protocol: Yes   Plan:  Coumadin 2 mg PO x 1 tonight Monitor daily INR/CBC, s/sx of bleed, PO intake, DDI   Edison Nicholson L. Marcy Salvoaymond, PharmD, MS PGY1 Pharmacy Resident [phone]

## 2017-07-22 NOTE — Progress Notes (Signed)
Subjective/Complaints: Patient seen lying in bed this morning. He states he slept fairly overnight as result of being in the hospital. He is in good spirits this morning.  ROS: Denies nausea, vomiting, diarrhea, shortness of breath or chest pain   Objective: Vital Signs: Blood pressure 108/74, pulse 81, temperature 98.1 F (36.7 C), temperature source Oral, resp. rate 18, height 5\' 9"  (1.753 m), weight 88.9 kg (195 lb 15.8 oz), SpO2 95 %. No results found. Results for orders placed or performed during the hospital encounter of 07/06/17 (from the past 72 hour(s))  Protime-INR     Status: Abnormal   Collection Time: 07/20/17  7:49 AM  Result Value Ref Range   Prothrombin Time 23.0 (H) 11.4 - 15.2 seconds   INR 2.05   Protime-INR     Status: Abnormal   Collection Time: 07/21/17  6:49 AM  Result Value Ref Range   Prothrombin Time 24.6 (H) 11.4 - 15.2 seconds   INR 2.24     Gen NAD. Vital signs reviewed.  HEENT: normal. Atraumatic Cardio: RRR. No JVD  Resp: CTA Bilaterally. Normal effort  GI: BS positive and ND Musc/Skel:  No tenderness. No edema.  Neuro: Alert and oriented Motor: LUE: 4-/5 proximal to distal  LLE: HF, KE 4+/5, ADF/PF 1/5, wiggles toes  (unchanged) Skin:   Intact. Warm and dry  Assessment/Plan: 1. 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 Physiatrist is providing close team supervision and 24 hour management of active medical problems listed below. Physiatrist and rehab team continue to assess barriers to discharge/monitor patient progress toward functional and medical goals. FIM: Function - Bathing Bathing activity did not occur: Refused Position: Shower Body parts bathed by patient: Left arm, Chest, Abdomen, Front perineal area, Buttocks, Left upper leg, Right upper leg, Right lower leg, Left lower leg, Right arm Body parts  bathed by helper: Left lower leg, Right lower leg, Right arm, Back Bathing not applicable: Back Assist Level: Supervision or verbal cues  Function- Upper Body Dressing/Undressing What is the patient wearing?: Pull over shirt/dress Pull over shirt/dress - Perfomed by patient: Thread/unthread right sleeve, Thread/unthread left sleeve, Put head through opening, Pull shirt over trunk Pull over shirt/dress - Perfomed by helper: Thread/unthread left sleeve Assist Level: Supervision or verbal cues Function - Lower Body Dressing/Undressing What is the patient wearing?: Socks, Shoes, Underwear, Pants Position: Sitting EOB Underwear - Performed by patient: Thread/unthread right underwear leg, Thread/unthread left underwear leg, Pull underwear up/down Underwear - Performed by helper: Thread/unthread left underwear leg, Pull underwear up/down Pants- Performed by patient: Thread/unthread right pants leg, Thread/unthread left pants leg, Pull pants up/down, Fasten/unfasten pants Pants- Performed by helper: Fasten/unfasten pants Non-skid slipper socks- Performed by helper: Don/doff right sock, Don/doff left sock Socks - Performed by patient: Don/doff right sock, Don/doff left sock Socks - Performed by helper: Don/doff right sock, Don/doff left sock Shoes - Performed by patient: Don/doff right shoe Shoes - Performed by helper: Fasten right, Fasten left, Don/doff left shoe Assist for footwear: Supervision/touching assist Assist for lower body dressing: Touching or steadying assistance (Pt > 75%)  Function - Toileting Toileting activity did not occur: (no BM, used urinal) Toileting steps completed by patient: Adjust clothing prior to toileting, Performs perineal hygiene, Adjust clothing after toileting Toileting steps completed by helper: Adjust clothing prior to toileting, Adjust clothing after toileting Toileting Assistive Devices: Grab bar or rail Assist level: Supervision or verbal  cues  Function -  ArchivistToilet Transfers Toilet transfer activity did not occur: N/A Toilet transfer assistive device: Grab bar Assist level to toilet: Touching or steadying assistance (Pt > 75%) Assist level from toilet: Touching or steadying assistance (Pt > 75%)  Function - Chair/bed transfer Chair/bed transfer activity did not occur: N/A Chair/bed transfer method: Stand pivot Chair/bed transfer assist level: Supervision or verbal cues Chair/bed transfer assistive device: Armrests, Cane, Orthosis Chair/bed transfer details: Verbal cues for precautions/safety, Manual facilitation for weight shifting  Function - Locomotion: Wheelchair Will patient use wheelchair at discharge?: Yes Type: Manual Max wheelchair distance: 75 Assist Level: Supervision or verbal cues Assist Level: Supervision or verbal cues Wheel 150 feet activity did not occur: Safety/medical concerns Turns around,maneuvers to table,bed, and toilet,negotiates 3% grade,maneuvers on rugs and over doorsills: No Function - Locomotion: Ambulation Assistive device: Orthosis, Cane-quad Max distance: 200 Assist level: Touching or steadying assistance (Pt > 75%) Assist level: Touching or steadying assistance (Pt > 75%) Walk 50 feet with 2 turns activity did not occur: Safety/medical concerns Assist level: Touching or steadying assistance (Pt > 75%) Walk 150 feet activity did not occur: Safety/medical concerns Assist level: Touching or steadying assistance (Pt > 75%) Walk 10 feet on uneven surfaces activity did not occur: Safety/medical concerns  Function - Comprehension Comprehension: Auditory Comprehension assist level: Follows complex conversation/direction with no assist  Function - Expression Expression: Verbal Expression assist level: Expresses complex ideas: With no assist  Function - Social Interaction Social Interaction assist level: Interacts appropriately with others - No medications needed.  Function - Problem Solving Problem  solving assist level: Solves complex problems: Recognizes & self-corrects  Function - Memory Memory assist level: More than reasonable amount of time Patient normally able to recall (first 3 days only): Current season, Location of own room, Staff names and faces, That he or she is in a hospital  Medical Problem List and Plan:  1. 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 DVT Prophylaxis/Anticoagulation: Pharmaceutical:    INR therapeutic on 12/22 3. Pain Management: tylenol prn    When necessary Robaxin added for muscle tightness on 12/20-improved 4. Mood: LCSW to follow for evaluation and support. Neuropsychology follow-up with the patient  5. Neuropsych: This patient is capable of making decisions on his own behalf.  6. Skin/Wound Care: Routine pressure relief measures.  7. Fluids/Electrolytes/Nutrition: Monitor I/O.    BMP within acceptable range on 12/20 8. Severe CAD with CM: Treated medically with ASA, statin , no angina  9. Acute systolic CHF: Was started spironolactone and digoxin on 12/7. Cardiology to follow, avoid hypotension/hypoperfusion.    Appreciate Cards recs, reviewed, continue medications.     Filed Weights   07/20/17 0500 07/21/17 0644 07/22/17 0630  Weight: 89.1 kg (196 lb 6.9 oz) 89.6 kg (197 lb 8.5 oz) 88.9 kg (195 lb 15.8 oz)    Stable on 12/23  Vitals:   07/21/17 1408 07/22/17 0630  BP: 116/65 108/74  Pulse: 77 81  Resp: 18 18  Temp: 98.1 F (36.7 C) 98.1 F (36.7 C)  SpO2: 95%    10.  Apical thrombus cont warfarin 11. Prediabetes: Hgb A1C- 6.0. Will have dietitian educate patient on CM diet--monitor BS for trends.    Relatively controlled on 12/20 12. Polysubstance abuse: Has been counseled on importance of alcohol, cocaine and THC cessation-appreciate Neuropsych consult 13. Leukocytosis: Resolved   WBCs 10.2 on 12/20   Cont  to monitor 14. Hemorrhoids     Anusol ordered on 12/17   Improving   LOS (Days) 16 A FACE TO FACE EVALUATION WAS PERFORMED  Steffon Gladu Karis Juba 07/22/2017, 7:27 AM

## 2017-07-23 ENCOUNTER — Inpatient Hospital Stay (HOSPITAL_COMMUNITY): Payer: Managed Care, Other (non HMO)

## 2017-07-23 ENCOUNTER — Other Ambulatory Visit: Payer: Self-pay

## 2017-07-23 ENCOUNTER — Inpatient Hospital Stay (HOSPITAL_COMMUNITY): Payer: Managed Care, Other (non HMO) | Admitting: Physical Therapy

## 2017-07-23 LAB — PROTIME-INR
INR: 2.35
PROTHROMBIN TIME: 25.5 s — AB (ref 11.4–15.2)

## 2017-07-23 LAB — CBC
HCT: 46.7 % (ref 39.0–52.0)
HEMOGLOBIN: 15.5 g/dL (ref 13.0–17.0)
MCH: 30.5 pg (ref 26.0–34.0)
MCHC: 33.2 g/dL (ref 30.0–36.0)
MCV: 91.7 fL (ref 78.0–100.0)
Platelets: 336 10*3/uL (ref 150–400)
RBC: 5.09 MIL/uL (ref 4.22–5.81)
RDW: 13.4 % (ref 11.5–15.5)
WBC: 10.8 10*3/uL — ABNORMAL HIGH (ref 4.0–10.5)

## 2017-07-23 MED ORDER — WARFARIN SODIUM 2 MG PO TABS
2.0000 mg | ORAL_TABLET | Freq: Once | ORAL | Status: AC
  Administered 2017-07-23: 2 mg via ORAL
  Filled 2017-07-23: qty 1

## 2017-07-23 NOTE — Progress Notes (Signed)
Physical Therapy Session Note  Patient Details  Name: Angel Davies MRN: [id number] Date of Birth: 03/21/1971  Today's Date: 07/23/2017 PT Individual Time: 1330-1400 PT Individual Time Calculation (min): 30 min   Short Term Goals: Week 2:  PT Short Term Goal 1 (Week 2): =LTGs due to ELOS  Skilled Therapeutic Interventions/Progress Updates:    Pt supine in bed, agreeable to participate in therapy session. Pt reports some R shin pain with activity, pain not rated and pt tolerates therapy. Ambulation 2 x 150 ft with no AD and CGA, v/c for safety with turns and to decrease gait speed. Dynamic standing balance activities: ambulation across uneven surface and up/down ramp with CGA, tandem ambulation with 0-1 UE support and Min Assist, side-steps with hand-held assist and occasional Min Assist due to one instance of near LOB. Ascend/descend 12 stairs with 0-1 handrails and CGA, step-to and step-through gait pattern. Pt exhibits some impulsivity which decreases safety with dynamic standing activities. Pt left supine in bed with needs in reach.  Therapy Documentation Precautions:  Precautions Precautions: Fall Restrictions Weight Bearing Restrictions: No  See Function Navigator for Current Functional Status.   Therapy/Group: Individual Therapy  Peter Congoaylor Javius Sylla, PT, DPT  07/23/2017, 2:59 PM

## 2017-07-23 NOTE — Progress Notes (Signed)
Physical Therapy Session Note  Patient Details  Name: MARKE GOODWYN MRN: [id number] Date of Birth: Nov 18, 1970  Today's Date: 07/23/2017 PT Individual Time: 1000-1100 PT Individual Time Calculation (min): 60 min   Short Term Goals: Week 2:  PT Short Term Goal 1 (Week 2): =LTGs due to ELOS  Skilled Therapeutic Interventions/Progress Updates:    no c/o pain.  Session focus on high level gait, LLE NMR, and safety awareness.   Pt ambulates throughout session without AD and close supervision<>min guard.  Occasionally with L knee instability, especially with impulsive movements, requires verbal cues to slow down.  High level gait training through obstacle course, focus on compliant surfaces, stepping over/around objects, and 4 square step test.  Pt requires min assist for navigation through first 2 trials, and close supervision/min guard on 2nd two trial.  Mod verbal cues for pacing and caution to increase independence.    Kinetron 3 trials to fatigue in standing focus on L quad activation in stance phase.  Pt requires mod tactile cues for decreased recurvatum and verbal cues for shifting weight to L.    Standing balance on foam x2 minutes without UE support during table top activity for ankle strategy and bimanual motor task.  Pt returned to room at end of session and positioned EOB with call bell in reach and needs met.   Therapy Documentation Precautions:  Precautions Precautions: Fall Restrictions Weight Bearing Restrictions: No   See Function Navigator for Current Functional Status.   Therapy/Group: Individual Therapy  [name] 07/23/2017, 12:07 PM

## 2017-07-23 NOTE — Progress Notes (Signed)
ANTICOAGULATION CONSULT NOTE - Follow Up Consult  Pharmacy Consult for Coumadin Indication: stroke and LV thrombus  No Known Allergies  Patient Measurements: Height: 5\' 9"  (175.3 cm) Weight: 199 lb 15.3 oz (90.7 kg) IBW/kg (Calculated) : 70.7  Vital Signs: Temp: 97.6 F (36.4 C) (12/24 0605) Temp Source: Oral (12/24 0605) BP: 94/50 (12/24 0605) Pulse Rate: 68 (12/24 0605)  Labs: Recent Labs    07/21/17 0649 07/22/17 1148 07/23/17 0758  HGB  --  16.0 15.5  HCT  --  47.9 46.7  PLT  --  352 336  LABPROT 24.6* 23.7* 25.5*  INR 2.24 2.13 2.35    Estimated Creatinine Clearance: 115.6 mL/min (by C-G formula based on SCr of 0.87 mg/dL).  Assessment: 46 y/o M s/p acute embolic stroke, also found to have LV thrombus. Now on warfarin for anticoagulation. Patient seems to be very sensitive to warfarin and noted patient also on fenofibrate which can raise INR.   INR therapeutic at 2.35 today. CBC wnl and stable, no reports of bleeding.    Goal of Therapy:  INR goal 2-3 Monitor platelets by anticoagulation protocol: Yes   Plan:  Warfarin 2 mg PO x 1 tonight Daily INR/CBC Monitor for s/sx of bleeding  Blake DivineShannon Debbora Ang, Pharm.D. PGY1 Pharmacy Resident 07/23/2017 9:31 AM Main Pharmacy: [phone]

## 2017-07-23 NOTE — Progress Notes (Signed)
Physical Therapy Session Note  Patient Details  Name: Angel CooperJames D Davies MRN: [id number] Date of Birth: 03/25/1971  Today's Date: 07/23/2017 PT Individual Time: 0800-0842 PT Individual Time Calculation (min): 42 min   Short Term Goals: Week 2:  PT Short Term Goal 1 (Week 2): =LTGs due to ELOS  Skilled Therapeutic Interventions/Progress Updates:    Pt seated in recliner upon PT arrival, agreeable to therapy tx and denies pain. Pt ambulated from room<>gym without AD, CGA and verbal cues for L heel strike. Pt worked on dynamic balance and coordination to perform toe taps on numbered 4 inch step, x 2 trials. Pt performed floor transfer with supervision and verbal cues for techniques. While on the mat on the floor pt sat in long sitting for hamstring stretch x 30 sec. Pt in quadruped worked on L NMR , core strengthening and hip strengthening to perform alternating hip extension 2 x 10. Pt performed stretches to increase muscle flexibility each 2 x 30 sec bilaterally: manual hamstring stretch in supine, piriformis stretch in supine, figure four stretch in sitting and manual hip flexor stretch in sidelying. Pt ambulated back to room and left seated in recliner with needs in reach.   Therapy Documentation Precautions:  Precautions Precautions: Fall Restrictions Weight Bearing Restrictions: No   See Function Navigator for Current Functional Status.   Therapy/Group: Individual Therapy  Cresenciano GenreEmily van Schagen, PT, DPT 07/23/2017, 7:47 AM

## 2017-07-23 NOTE — Progress Notes (Signed)
Subjective/Complaints:  Pt using Left arm more spontaneously  ROS: Denies nausea, vomiting, diarrhea, shortness of breath or chest pain   Objective: Vital Signs: Blood pressure (!) 94/50, pulse 68, temperature 97.6 F (36.4 C), temperature source Oral, resp. rate 18, height 5\' 9"  (1.753 m), weight 90.7 kg (199 lb 15.3 oz), SpO2 98 %. No results found. Results for orders placed or performed during the hospital encounter of 07/06/17 (from the past 72 hour(s))  Protime-INR     Status: Abnormal   Collection Time: 07/21/17  6:49 AM  Result Value Ref Range   Prothrombin Time 24.6 (H) 11.4 - 15.2 seconds   INR 2.24   Protime-INR     Status: Abnormal   Collection Time: 07/22/17 11:48 AM  Result Value Ref Range   Prothrombin Time 23.7 (H) 11.4 - 15.2 seconds   INR 2.13   CBC     Status: Abnormal   Collection Time: 07/22/17 11:48 AM  Result Value Ref Range   WBC 10.7 (H) 4.0 - 10.5 K/uL   RBC 5.23 4.22 - 5.81 MIL/uL   Hemoglobin 16.0 13.0 - 17.0 g/dL   HCT 16.1 09.6 - 04.5 %   MCV 91.6 78.0 - 100.0 fL   MCH 30.6 26.0 - 34.0 pg   MCHC 33.4 30.0 - 36.0 g/dL   RDW 40.9 81.1 - 91.4 %   Platelets 352 150 - 400 K/uL  Protime-INR     Status: Abnormal   Collection Time: 07/23/17  7:58 AM  Result Value Ref Range   Prothrombin Time 25.5 (H) 11.4 - 15.2 seconds   INR 2.35   CBC     Status: Abnormal   Collection Time: 07/23/17  7:58 AM  Result Value Ref Range   WBC 10.8 (H) 4.0 - 10.5 K/uL   RBC 5.09 4.22 - 5.81 MIL/uL   Hemoglobin 15.5 13.0 - 17.0 g/dL   HCT 78.2 95.6 - 21.3 %   MCV 91.7 78.0 - 100.0 fL   MCH 30.5 26.0 - 34.0 pg   MCHC 33.2 30.0 - 36.0 g/dL   RDW 08.6 57.8 - 46.9 %   Platelets 336 150 - 400 K/uL    Gen NAD. Vital signs reviewed.  HEENT: normal. Atraumatic Cardio: RRR. No JVD  Resp: CTA Bilaterally. Normal effort  GI: BS positive and ND Musc/Skel:  No tenderness. No edema.  Neuro: Alert and oriented Motor: LUE: 4-/5 proximal to distal  LLE: HF, KE 4+/5, ADF/PF  3-/5, finger to thumb opposition all 4 fingers Skin:   Intact. Warm and dry  Assessment/Plan: 1. 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 Physiatrist is providing close team supervision and 24 hour management of active medical problems listed below. Physiatrist and rehab team continue to assess barriers to discharge/monitor patient progress toward functional and medical goals. FIM: Function - Bathing Bathing activity did not occur: Refused Position: Shower Body parts bathed by patient: Left arm, Chest, Abdomen, Front perineal area, Buttocks, Left upper leg, Right upper leg, Right lower leg, Left lower leg, Right arm Body parts bathed by helper: Left lower leg, Right lower leg, Right arm, Back Bathing not applicable: Back Assist Level: Supervision or verbal cues  Function- Upper Body Dressing/Undressing What is the patient wearing?: Pull over shirt/dress Pull over shirt/dress - Perfomed by patient: Thread/unthread right sleeve, Thread/unthread left sleeve, Put head through opening, Pull shirt over trunk Pull over shirt/dress - Perfomed  by helper: Thread/unthread left sleeve Assist Level: Supervision or verbal cues Function - Lower Body Dressing/Undressing What is the patient wearing?: Socks, Shoes, Underwear, Pants Position: Sitting EOB Underwear - Performed by patient: Thread/unthread right underwear leg, Thread/unthread left underwear leg, Pull underwear up/down Underwear - Performed by helper: Thread/unthread left underwear leg, Pull underwear up/down Pants- Performed by patient: Thread/unthread right pants leg, Thread/unthread left pants leg, Pull pants up/down, Fasten/unfasten pants Pants- Performed by helper: Fasten/unfasten pants Non-skid slipper socks- Performed by helper: Don/doff right sock, Don/doff left sock Socks - Performed by patient: Don/doff  right sock, Don/doff left sock Socks - Performed by helper: Don/doff right sock, Don/doff left sock Shoes - Performed by patient: Don/doff right shoe Shoes - Performed by helper: Fasten right, Fasten left, Don/doff left shoe Assist for footwear: Supervision/touching assist Assist for lower body dressing: Touching or steadying assistance (Pt > 75%)  Function - Toileting Toileting activity did not occur: (no BM, used urinal) Toileting steps completed by patient: Adjust clothing prior to toileting, Performs perineal hygiene, Adjust clothing after toileting Toileting steps completed by helper: Adjust clothing prior to toileting, Adjust clothing after toileting Toileting Assistive Devices: Grab bar or rail Assist level: Supervision or verbal cues  Function - ArchivistToilet Transfers Toilet transfer activity did not occur: N/A Toilet transfer assistive device: Grab bar Assist level to toilet: Touching or steadying assistance (Pt > 75%) Assist level from toilet: Touching or steadying assistance (Pt > 75%)  Function - Chair/bed transfer Chair/bed transfer activity did not occur: N/A Chair/bed transfer method: Ambulatory Chair/bed transfer assist level: Supervision or verbal cues Chair/bed transfer assistive device: Cane, Orthosis Chair/bed transfer details: Verbal cues for precautions/safety, Manual facilitation for weight shifting  Function - Locomotion: Wheelchair Will patient use wheelchair at discharge?: Yes Type: Manual Max wheelchair distance: 75 Assist Level: Supervision or verbal cues Assist Level: Supervision or verbal cues Wheel 150 feet activity did not occur: Safety/medical concerns Turns around,maneuvers to table,bed, and toilet,negotiates 3% grade,maneuvers on rugs and over doorsills: No Function - Locomotion: Ambulation Assistive device: Orthosis, Cane-quad Max distance: 200 Assist level: Touching or steadying assistance (Pt > 75%) Assist level: Touching or steadying assistance  (Pt > 75%) Walk 50 feet with 2 turns activity did not occur: Safety/medical concerns Assist level: Touching or steadying assistance (Pt > 75%) Walk 150 feet activity did not occur: Safety/medical concerns Assist level: Touching or steadying assistance (Pt > 75%) Walk 10 feet on uneven surfaces activity did not occur: Safety/medical concerns  Function - Comprehension Comprehension: Auditory Comprehension assist level: Follows complex conversation/direction with no assist  Function - Expression Expression: Verbal Expression assist level: Expresses complex ideas: With no assist  Function - Social Interaction Social Interaction assist level: Interacts appropriately with others - No medications needed.  Function - Problem Solving Problem solving assist level: Solves complex problems: Recognizes & self-corrects  Function - Memory Memory assist level: More than reasonable amount of time Patient normally able to recall (first 3 days only): Current season, Location of own room, Staff names and faces, That he or she is in a hospital  Medical Problem List and Plan:  1. 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 DVT Prophylaxis/Anticoagulation: Pharmaceutical: on warfarin for cardiac thrombus   INR therapeutic on 12/24 3. Pain Management: tylenol prn    When necessary Robaxin added for muscle tightness on 12/20-improved 4. Mood: LCSW to follow for evaluation and support. Neuropsychology follow-up with the  patient  5. Neuropsych: This patient is capable of making decisions on his own behalf.  6. Skin/Wound Care: Routine pressure relief measures.  7. Fluids/Electrolytes/Nutrition: Monitor I/O.    BMP within acceptable range on 12/20 8. Severe CAD with CM: Treated medically with ASA, statin , no angina  9. Acute systolic CHF: Was started spironolactone and digoxin on 12/7. Cardiology to  follow, avoid hypotension/hypoperfusion.    Appreciate Cards recs, reviewed, continue medications.     Filed Weights   07/21/17 0644 07/22/17 0630 07/23/17 0605  Weight: 89.6 kg (197 lb 8.5 oz) 88.9 kg (195 lb 15.8 oz) 90.7 kg (199 lb 15.3 oz)    Fluctuating in 5lb range  Vitals:   07/22/17 1712 07/23/17 0605  BP: (!) 110/59 (!) 94/50  Pulse: 79 68  Resp: 18 18  Temp: 98.3 F (36.8 C) 97.6 F (36.4 C)  SpO2: 98% 98%   10.  Apical thrombus cont warfarin 11. Prediabetes: Hgb A1C- 6.0. Will have dietitian educate patient on CM diet--monitor BS for trends.    No meds 12. Polysubstance abuse: Has been counseled on importance of alcohol, cocaine and THC cessation-appreciate Neuropsych consult    LOS (Days) 17 A FACE TO FACE EVALUATION WAS PERFORMED  Erick Colacendrew E Kirsteins 07/23/2017, 8:38 AM

## 2017-07-23 NOTE — Progress Notes (Signed)
Occupational Therapy Session Note  Patient Details  Name: NUNO BRUBACHER MRN: [id number] Date of Birth: 12/24/1970  Today's Date: 07/23/2017 OT Individual Time: 0700-0757 OT Individual Time Calculation (min): 57 min    Short Term Goals: Week 1:  OT Short Term Goal 1 (Week 1): Pt will stand at sink to groom 2/2 items wiht supervision OT Short Term Goal 1 - Progress (Week 1): Met OT Short Term Goal 2 (Week 1): Pt will don shirt with supervision and no more than 1 cue for hemi techniques OT Short Term Goal 2 - Progress (Week 1): Met OT Short Term Goal 3 (Week 1): Pt will fasten pants to improve Portis wiht up to min A OT Short Term Goal 3 - Progress (Week 1): Met OT Short Term Goal 4 (Week 1): Pt will transfer to toilet/BSC with supervision and LRAD OT Short Term Goal 4 - Progress (Week 1): Progressing toward goal OT Short Term Goal 5 (Week 1): Pt will bathe 50% of body with LUE and AE PRN OT Short Term Goal 5 - Progress (Week 1): Met  Skilled Therapeutic Interventions/Progress Updates:    1:1. Pt ambulates throughout session wihtout AD and 1 LOB to L with min A to recover and VC for L foot clearance. In tx gym pt completes box and blocks assessment with supervision seated at high low table moving 52 blocks in RUE and 30 block with LUE. Pt decorates mini christmas tree for BUE [place] hanging small ornaments on tree with LUE and VC to decrease L shoulder hike. Pt completes 2x10 towel glides against wall shoulder flex/ext, horizontal ab/adduction, elbow flex/ext and and circles in B direction with tactile cues and scapula manual facilitation through normal ranges for NMR. Exited session with pt seated EOB awaiting next therapy  Therapy Documentation Precautions:  Precautions Precautions: Fall Restrictions Weight Bearing Restrictions: No General:   See Function Navigator for Current Functional Status.   Therapy/Group: Individual Therapy  Tonny Branch 07/23/2017, 7:57 AM

## 2017-07-24 LAB — CBC
HCT: 45.3 % (ref 39.0–52.0)
HEMOGLOBIN: 14.9 g/dL (ref 13.0–17.0)
MCH: 30.1 pg (ref 26.0–34.0)
MCHC: 32.9 g/dL (ref 30.0–36.0)
MCV: 91.5 fL (ref 78.0–100.0)
PLATELETS: 313 10*3/uL (ref 150–400)
RBC: 4.95 MIL/uL (ref 4.22–5.81)
RDW: 13.4 % (ref 11.5–15.5)
WBC: 11.1 10*3/uL — ABNORMAL HIGH (ref 4.0–10.5)

## 2017-07-24 LAB — PROTIME-INR
INR: 2.45
PROTHROMBIN TIME: 26.4 s — AB (ref 11.4–15.2)

## 2017-07-24 MED ORDER — WARFARIN SODIUM 2 MG PO TABS
2.0000 mg | ORAL_TABLET | Freq: Once | ORAL | Status: AC
  Administered 2017-07-24: 2 mg via ORAL
  Filled 2017-07-24: qty 1

## 2017-07-24 NOTE — Progress Notes (Signed)
Subjective/Complaints:  No issues overnite, some sleep issues but no pain, SOB or bladder issues disturbing sleep  ROS: Denies nausea, vomiting, diarrhea, shortness of breath or chest pain   Objective: Vital Signs: Blood pressure (!) 116/59, pulse 79, temperature 98.2 F (36.8 C), temperature source Oral, resp. rate 18, height 5\' 9"  (1.753 m), weight 88.3 kg (194 lb 10.7 oz), SpO2 93 %. No results found. Results for orders placed or performed during the hospital encounter of 07/06/17 (from the past 72 hour(s))  Protime-INR     Status: Abnormal   Collection Time: 07/22/17 11:48 AM  Result Value Ref Range   Prothrombin Time 23.7 (H) 11.4 - 15.2 seconds   INR 2.13   CBC     Status: Abnormal   Collection Time: 07/22/17 11:48 AM  Result Value Ref Range   WBC 10.7 (H) 4.0 - 10.5 K/uL   RBC 5.23 4.22 - 5.81 MIL/uL   Hemoglobin 16.0 13.0 - 17.0 g/dL   HCT 45.4 09.8 - 11.9 %   MCV 91.6 78.0 - 100.0 fL   MCH 30.6 26.0 - 34.0 pg   MCHC 33.4 30.0 - 36.0 g/dL   RDW 14.7 82.9 - 56.2 %   Platelets 352 150 - 400 K/uL  Protime-INR     Status: Abnormal   Collection Time: 07/23/17  7:58 AM  Result Value Ref Range   Prothrombin Time 25.5 (H) 11.4 - 15.2 seconds   INR 2.35   CBC     Status: Abnormal   Collection Time: 07/23/17  7:58 AM  Result Value Ref Range   WBC 10.8 (H) 4.0 - 10.5 K/uL   RBC 5.09 4.22 - 5.81 MIL/uL   Hemoglobin 15.5 13.0 - 17.0 g/dL   HCT 13.0 86.5 - 78.4 %   MCV 91.7 78.0 - 100.0 fL   MCH 30.5 26.0 - 34.0 pg   MCHC 33.2 30.0 - 36.0 g/dL   RDW 69.6 29.5 - 28.4 %   Platelets 336 150 - 400 K/uL  Protime-INR     Status: Abnormal   Collection Time: 07/24/17  7:26 AM  Result Value Ref Range   Prothrombin Time 26.4 (H) 11.4 - 15.2 seconds   INR 2.45   CBC     Status: Abnormal   Collection Time: 07/24/17  7:26 AM  Result Value Ref Range   WBC 11.1 (H) 4.0 - 10.5 K/uL   RBC 4.95 4.22 - 5.81 MIL/uL   Hemoglobin 14.9 13.0 - 17.0 g/dL   HCT 13.2 44.0 - 10.2 %   MCV  91.5 78.0 - 100.0 fL   MCH 30.1 26.0 - 34.0 pg   MCHC 32.9 30.0 - 36.0 g/dL   RDW 72.5 36.6 - 44.0 %   Platelets 313 150 - 400 K/uL    Gen NAD. Vital signs reviewed.  HEENT: normal. Atraumatic Cardio: RRR. No JVD  Resp: CTA Bilaterally. Normal effort  GI: BS positive and ND Musc/Skel:  No tenderness. No edema.  Neuro: Alert and oriented Motor: LUE: 4-/5 proximal to distal  LLE: HF, KE 4+/5, ADF/PF 3-/5, finger to thumb opposition all 4 fingers Skin:   Intact. Warm and dry  Assessment/Plan: 1. 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 Physiatrist is providing close team supervision and 24 hour management of active medical problems listed below. Physiatrist and rehab team continue to assess barriers to discharge/monitor patient progress toward functional and medical  goals. FIM: Function - Bathing Bathing activity did not occur: Refused Position: Shower Body parts bathed by patient: Left arm, Chest, Abdomen, Front perineal area, Buttocks, Left upper leg, Right upper leg, Right lower leg, Left lower leg, Right arm Body parts bathed by helper: Left lower leg, Right lower leg, Right arm, Back Bathing not applicable: Back Assist Level: Supervision or verbal cues  Function- Upper Body Dressing/Undressing What is the patient wearing?: Pull over shirt/dress Pull over shirt/dress - Perfomed by patient: Thread/unthread right sleeve, Thread/unthread left sleeve, Put head through opening, Pull shirt over trunk Pull over shirt/dress - Perfomed by helper: Thread/unthread left sleeve Assist Level: Supervision or verbal cues Function - Lower Body Dressing/Undressing What is the patient wearing?: Socks, Shoes, Underwear, Pants Position: Sitting EOB Underwear - Performed by patient: Thread/unthread right underwear leg, Thread/unthread left underwear leg, Pull underwear  up/down Underwear - Performed by helper: Thread/unthread left underwear leg, Pull underwear up/down Pants- Performed by patient: Thread/unthread right pants leg, Thread/unthread left pants leg, Pull pants up/down, Fasten/unfasten pants Pants- Performed by helper: Fasten/unfasten pants Non-skid slipper socks- Performed by helper: Don/doff right sock, Don/doff left sock Socks - Performed by patient: Don/doff right sock, Don/doff left sock Socks - Performed by helper: Don/doff right sock, Don/doff left sock Shoes - Performed by patient: Don/doff right shoe Shoes - Performed by helper: Fasten right, Fasten left, Don/doff left shoe Assist for footwear: Supervision/touching assist Assist for lower body dressing: Touching or steadying assistance (Pt > 75%)  Function - Toileting Toileting activity did not occur: (no BM, used urinal) Toileting steps completed by patient: Adjust clothing prior to toileting, Performs perineal hygiene, Adjust clothing after toileting Toileting steps completed by helper: Adjust clothing prior to toileting, Adjust clothing after toileting Toileting Assistive Devices: Grab bar or rail Assist level: Supervision or verbal cues  Function - ArchivistToilet Transfers Toilet transfer activity did not occur: N/A Toilet transfer assistive device: Grab bar Assist level to toilet: Touching or steadying assistance (Pt > 75%) Assist level from toilet: Touching or steadying assistance (Pt > 75%)  Function - Chair/bed transfer Chair/bed transfer activity did not occur: N/A Chair/bed transfer method: Ambulatory Chair/bed transfer assist level: Supervision or verbal cues Chair/bed transfer assistive device: Cane, Orthosis Chair/bed transfer details: Verbal cues for precautions/safety  Function - Locomotion: Wheelchair Will patient use wheelchair at discharge?: Yes Type: Manual Max wheelchair distance: 75 Assist Level: Supervision or verbal cues Assist Level: Supervision or verbal  cues Wheel 150 feet activity did not occur: Safety/medical concerns Turns around,maneuvers to table,bed, and toilet,negotiates 3% grade,maneuvers on rugs and over doorsills: No Function - Locomotion: Ambulation Assistive device: No device Max distance: 150 Assist level: Touching or steadying assistance (Pt > 75%) Assist level: Touching or steadying assistance (Pt > 75%) Walk 50 feet with 2 turns activity did not occur: Safety/medical concerns Assist level: Touching or steadying assistance (Pt > 75%) Walk 150 feet activity did not occur: Safety/medical concerns Assist level: Touching or steadying assistance (Pt > 75%) Walk 10 feet on uneven surfaces activity did not occur: Safety/medical concerns Assist level: Touching or steadying assistance (Pt > 75%)  Function - Comprehension Comprehension: Auditory Comprehension assist level: Follows complex conversation/direction with no assist  Function - Expression Expression: Verbal Expression assist level: Expresses complex ideas: With no assist  Function - Social Interaction Social Interaction assist level: Interacts appropriately with others - No medications needed.  Function - Problem Solving Problem solving assist level: Solves complex problems: Recognizes & self-corrects  Function - Memory Memory assist level:  More than reasonable amount of time Patient normally able to recall (first 3 days only): Current season, Location of own room, Staff names and faces, That he or she is in a hospital  Medical Problem List and Plan:  1. Functional deficits and left hemiparesis secondary to acute right pontine infarct/subacute right temporal/occipital infarcts    Cont CIR PT, OT, team conf in am 2. DVT Prophylaxis/Anticoagulation: Pharmaceutical: on warfarin for cardiac thrombus   INR therapeutic on 12/25 3. Pain Management: tylenol prn    When necessary Robaxin added for muscle tightness on 12/20-improved 4. Mood: LCSW to follow for evaluation  and support. Neuropsychology follow-up with the patient  5. Neuropsych: This patient is capable of making decisions on his own behalf.  6. Skin/Wound Care: Routine pressure relief measures.  7. Fluids/Electrolytes/Nutrition: Monitor I/O.    BMP within acceptable range on 12/20 8. Severe CAD with CM: Treated medically with ASA, statin , no angina  9. Acute systolic CHF: Was started spironolactone and digoxin on 12/7. Cardiology to follow, avoid hypotension/hypoperfusion.    Appreciate Cards recs, reviewed, continue medications.     Filed Weights   07/22/17 0630 07/23/17 0605 07/24/17 0556  Weight: 88.9 kg (195 lb 15.8 oz) 90.7 kg (199 lb 15.3 oz) 88.3 kg (194 lb 10.7 oz)    Fluctuating in 5lb range  Vitals:   07/23/17 1530 07/24/17 0556  BP: (!) 101/50 (!) 116/59  Pulse: 70 79  Resp: 18 18  Temp: 97.9 F (36.6 C) 98.2 F (36.8 C)  SpO2: 96% 93%   10.  Apical thrombus cont warfarin 11. Prediabetes: Hgb A1C- 6.0. Will have dietitian educate patient on CM diet--monitor BS for trends.    No meds 12. Polysubstance abuse: Has been counseled on importance of alcohol, cocaine and THC cessation-appreciate Neuropsych consult 13.  Insomnia - avoid benzos given substance abuse, prn trazodone   LOS (Days) 18 A FACE TO FACE EVALUATION WAS PERFORMED  Erick Colacendrew E Ballard Budney 07/24/2017, 8:03 AM

## 2017-07-24 NOTE — Progress Notes (Signed)
ANTICOAGULATION CONSULT NOTE - Follow Up Consult  Pharmacy Consult for warfarin Indication: stroke and LV thrombus  No Known Allergies  Patient Measurements: Height: 5\' 9"  (175.3 cm) Weight: 194 lb 10.7 oz (88.3 kg) IBW/kg (Calculated) : 70.7  Vital Signs: Temp: 98.2 F (36.8 C) (12/25 0556) Temp Source: Oral (12/25 0556) BP: 116/59 (12/25 0556) Pulse Rate: 79 (12/25 0556)  Labs: Recent Labs    07/22/17 1148 07/23/17 0758 07/24/17 0726  HGB 16.0 15.5 14.9  HCT 47.9 46.7 45.3  PLT 352 336 313  LABPROT 23.7* 25.5* 26.4*  INR 2.13 2.35 2.45    Estimated Creatinine Clearance: 114.1 mL/min (by C-G formula based on SCr of 0.87 mg/dL).  Assessment: 46 y/o M s/p acute embolic stroke, also found to have LV thrombus. Now on warfarin for anticoagulation. Patient seems to be very sensitive to warfarin and noted patient also on fenofibrate which can raise INR.   INR therapeutic at 2.45 today. CBC wnl and stable, no s/s bleeding documented.  Great food intake.  Goal of Therapy:  INR goal 2-3 Monitor platelets by anticoagulation protocol: Yes   Plan:  Warfarin 2 mg PO x 1 tonight Daily INR, CBCs F/u s/sx bleeding, PO intake, DDI   Donnella Biyler Tashaya Ancrum, PharmD PGY1 Acute Care Pharmacy Resident Pager: [phone]  07/24/2017 8:41 AM

## 2017-07-25 ENCOUNTER — Inpatient Hospital Stay (HOSPITAL_COMMUNITY): Payer: Managed Care, Other (non HMO) | Admitting: Occupational Therapy

## 2017-07-25 ENCOUNTER — Inpatient Hospital Stay (HOSPITAL_COMMUNITY): Payer: Managed Care, Other (non HMO) | Admitting: Physical Therapy

## 2017-07-25 LAB — PROTIME-INR
INR: 2.38
PROTHROMBIN TIME: 25.8 s — AB (ref 11.4–15.2)

## 2017-07-25 MED ORDER — WARFARIN SODIUM 2 MG PO TABS
2.0000 mg | ORAL_TABLET | Freq: Once | ORAL | Status: AC
  Administered 2017-07-25: 2 mg via ORAL
  Filled 2017-07-25: qty 1

## 2017-07-25 NOTE — Progress Notes (Signed)
Physical Therapy Session Note  Patient Details  Name: Angel Davies MRN: [id number] Date of Birth: 04/17/1971  Today's Date: 07/25/2017 PT Individual Time: 0850-0930 PT Individual Time Calculation (min): 40 min   Short Term Goals: Week 2:  PT Short Term Goal 1 (Week 2): =LTGs due to ELOS  Skilled Therapeutic Interventions/Progress Updates: Pt presented in room with wife agreeable to therapy. Session focused on community ambulation. Pt ambulated to front of hospital no AD with one seated rest break. Noted to have increased L knee instability once became fatigued. Pt ambulated outside on uneven surfaces with min guard and no AD. Pt noted to have mild sway with increasingly distracting environment. Pt  returned to room and remained seated at EOB with wife present and needs met.      Therapy Documentation Precautions:  Precautions Precautions: Fall Restrictions Weight Bearing Restrictions: No General:   Vital Signs: Therapy Vitals Pulse Rate: 96 BP: 134/80 Pain: Pain Assessment Pain Assessment: No/denies pain  See Function Navigator for Current Functional Status.   Therapy/Group: Individual Therapy      , PTA  07/25/2017, 11:13 AM  

## 2017-07-25 NOTE — Patient Care Conference (Signed)
Inpatient RehabilitationTeam Conference and Plan of Care Update Date: 07/25/2017   Time: 10:45 AM    Patient Name: Angel Davies      Medical Record Number: [id number]  Date of Birth: 03-12-1971 Sex: Male         Room/Bed: 4M01C/4M01C-01 Payor Info: Payor: [employer] / Plan: Market researcher / Product Type: *No Product type* /    Admitting Diagnosis: CVA  Admit Date/Time:  07/06/2017  5:10 PM Admission Comments: No comment available   Primary Diagnosis:  <principal problem not specified> Principal Problem: <principal problem not specified>  Patient Active Problem List   Diagnosis Date Noted  . Muscle pain   . Supratherapeutic INR   . Leukocytosis   . Coronary artery disease involving native coronary artery of native heart without angina pectoris   . Prediabetes   . Hemorrhoids   . Right pontine stroke ([place]) 07/06/2017  . Left hemiparesis ([place])   . Cardiomyopathy, ischemic   . LV (left ventricular) mural thrombus following MI ([place])   . Acute systolic heart failure ([place])   . Myocardiopathy ([place])   . Mixed hyperlipidemia   . CVA (cerebral vascular accident) ([place]) 07/02/2017  . Alcohol use 07/02/2017  . Anxiety 07/02/2017  . Depression 07/02/2017  . Acute hyperglycemia 07/02/2017  . Elevated blood-pressure reading without diagnosis of hypertension 07/02/2017  . ADHD 07/02/2017  . Cocaine abuse ([place]) 07/02/2017  . Marijuana abuse 06/20/2017  . Cocaine use 06/20/2017    Expected Discharge Date: Expected Discharge Date: 07/27/17  Team Members Present: Physician leading conference: Dr. Alysia Penna Social Worker Present: Ovidio Kin, LCSW Nurse Present: Brita Romp, RN PT Present: Dwyane Dee, PT OT Present: [name], OT SLP Present: Charolett Bumpers, SLP PPS Coordinator present : Daiva Nakayama, RN, CRRN     Current Status/Progress Goal Weekly Team Focus  Medical   left hemiparesis improving, no cardiac decompensation  advance functional mobility without  exacerbating cardia c symptoms  D/C planning   Bowel/Bladder   continent of b/b, LBM 12/25  continent of b/b, maintain regular bowel pattern with mod I assist  monitor b/b q shift and prn   Swallow/Nutrition/ Hydration             ADL's   at goal level   Mod I BADL supevision iADL  home safety education, fall recovery edu, activity, standing balance and left NMR   Mobility   mod I transfers and gait within room, supervision for high level gait  mod I transfers and supervision gait with LRAD  balance, safety awareness, d/c planning   Communication             Safety/Cognition/ Behavioral Observations            Pain   no c/o pain  pain <2  monitor for pain q shift and prn   Skin   skin is intact without breakdown  maintain skin integrity  assess skin q shift and prn      *See Care Plan and progress notes for long and short-term goals.     Barriers to Discharge  Current Status/Progress Possible Resolutions Date Resolved   Physician    Medical stability  INR therapeutic  progressing toward goals  cont to mintor INR, weights, set up OP therapy      Nursing                  PT  OT                  SLP                SW                Discharge Planning/Teaching Needs:  HOme with wife and father, doing well and will exceed goals by discharge      Team Discussion:  Exceeding his goals of mod/i/ Speech discharged due to goals met. Medically stable for discharge 12/28.  Revisions to Treatment Plan:  DC 12/28    Continued Need for Acute Rehabilitation Level of Care: The patient requires daily medical management by a physician with specialized training in physical medicine and rehabilitation for the following conditions: Daily direction of a multidisciplinary physical rehabilitation program to ensure safe treatment while eliciting the highest outcome that is of practical value to the patient.: Yes Daily medical management of patient stability for  increased activity during participation in an intensive rehabilitation regime.: Yes Daily analysis of laboratory values and/or radiology reports with any subsequent need for medication adjustment of medical intervention for : Cardiac problems;Neurological problems  ,  G 07/25/2017, 3:09 PM    

## 2017-07-25 NOTE — Progress Notes (Signed)
Subjective/Complaints:  No CP with exercise, working on general conditioning more with PT, getting sore  ROS: Denies nausea, vomiting, diarrhea, shortness of breath or chest pain   Objective: Vital Signs: Blood pressure 128/67, pulse 71, temperature 97.6 F (36.4 C), temperature source Oral, resp. rate 18, height _0  (1.753 m), weight 88.3 kg (194 lb 10.7 oz), SpO2 96 %. No results found. Results for orders placed or performed during the hospital encounter of 07/06/17 (from the past 72 hour(s))  Protime-INR     Status: Abnormal   Collection Time: 07/22/17 11:48 AM  Result Value Ref Range   Prothrombin Time 23.7 (H) 11.4 - 15.2 seconds   INR 2.13   CBC     Status: Abnormal   Collection Time: 07/22/17 11:48 AM  Result Value Ref Range   WBC 10.7 (H) 4.0 - 10.5 K/uL   RBC 5.23 4.22 - 5.81 MIL/uL   Hemoglobin 16.0 13.0 - 17.0 g/dL   HCT 47.9 39.0 - 52.0 %   MCV 91.6 78.0 - 100.0 fL   MCH 30.6 26.0 - 34.0 pg   MCHC 33.4 30.0 - 36.0 g/dL   RDW 13.3 11.5 - 15.5 %   Platelets 352 150 - 400 K/uL  Protime-INR     Status: Abnormal   Collection Time: 07/23/17  7:58 AM  Result Value Ref Range   Prothrombin Time 25.5 (H) 11.4 - 15.2 seconds   INR 2.35   CBC     Status: Abnormal   Collection Time: 07/23/17  7:58 AM  Result Value Ref Range   WBC 10.8 (H) 4.0 - 10.5 K/uL   RBC 5.09 4.22 - 5.81 MIL/uL   Hemoglobin 15.5 13.0 - 17.0 g/dL   HCT 46.7 39.0 - 52.0 %   MCV 91.7 78.0 - 100.0 fL   MCH 30.5 26.0 - 34.0 pg   MCHC 33.2 30.0 - 36.0 g/dL   RDW 13.4 11.5 - 15.5 %   Platelets 336 150 - 400 K/uL  Protime-INR     Status: Abnormal   Collection Time: 07/24/17  7:26 AM  Result Value Ref Range   Prothrombin Time 26.4 (H) 11.4 - 15.2 seconds   INR 2.45   CBC     Status: Abnormal   Collection Time: 07/24/17  7:26 AM  Result Value Ref Range   WBC 11.1 (H) 4.0 - 10.5 K/uL   RBC 4.95 4.22 - 5.81 MIL/uL   Hemoglobin 14.9 13.0 - 17.0 g/dL   HCT 45.3 39.0 - 52.0 %   MCV 91.5 78.0 -  100.0 fL   MCH 30.1 26.0 - 34.0 pg   MCHC 32.9 30.0 - 36.0 g/dL   RDW 13.4 11.5 - 15.5 %   Platelets 313 150 - 400 K/uL    Gen NAD. Vital signs reviewed.  HEENT: normal. Atraumatic Cardio: RRR. No JVD  Resp: CTA Bilaterally. Normal effort  GI: BS positive and ND Musc/Skel:  No tenderness. No edema.  Neuro: Alert and oriented Motor: LUE: 4-/5 proximal to distal  LLE: HF, KE 4+/5, ADF/PF 3-/5, finger to thumb opposition all 4 fingers, +dysdiadochokinesis LUE and LLE foot/ankle Skin:   Intact. Warm and dry  Assessment/Plan: 1. 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 Physiatrist is providing close team supervision and 24 hour management of active medical problems listed below. Physiatrist and rehab team continue to assess barriers to discharge/monitor patient progress toward functional  and medical goals. FIM: Function - Bathing Bathing activity did not occur: Refused Position: Shower Body parts bathed by patient: Left arm, Chest, Abdomen, Front perineal area, Buttocks, Left upper leg, Right upper leg, Right lower leg, Left lower leg, Right arm Body parts bathed by helper: Left lower leg, Right lower leg, Right arm, Back Bathing not applicable: Back Assist Level: Supervision or verbal cues  Function- Upper Body Dressing/Undressing What is the patient wearing?: Pull over shirt/dress Pull over shirt/dress - Perfomed by patient: Thread/unthread right sleeve, Thread/unthread left sleeve, Put head through opening, Pull shirt over trunk Pull over shirt/dress - Perfomed by helper: Thread/unthread left sleeve Assist Level: Supervision or verbal cues Function - Lower Body Dressing/Undressing What is the patient wearing?: Socks, Shoes, Underwear, Pants Position: Sitting EOB Underwear - Performed by patient: Thread/unthread right underwear leg, Thread/unthread  left underwear leg, Pull underwear up/down Underwear - Performed by helper: Thread/unthread left underwear leg, Pull underwear up/down Pants- Performed by patient: Thread/unthread right pants leg, Thread/unthread left pants leg, Pull pants up/down, Fasten/unfasten pants Pants- Performed by helper: Fasten/unfasten pants Non-skid slipper socks- Performed by helper: Don/doff right sock, Don/doff left sock Socks - Performed by patient: Don/doff right sock, Don/doff left sock Socks - Performed by helper: Don/doff right sock, Don/doff left sock Shoes - Performed by patient: Don/doff right shoe Shoes - Performed by helper: Fasten right, Fasten left, Don/doff left shoe Assist for footwear: Supervision/touching assist Assist for lower body dressing: Touching or steadying assistance (Pt > 75%)  Function - Toileting Toileting activity did not occur: (no BM, used urinal) Toileting steps completed by patient: Adjust clothing prior to toileting, Performs perineal hygiene, Adjust clothing after toileting Toileting steps completed by helper: Adjust clothing prior to toileting, Adjust clothing after toileting Toileting Assistive Devices: Grab bar or rail Assist level: Touching or steadying assistance (Pt.75%)  Function - Air cabin crew transfer activity did not occur: N/A Toilet transfer assistive device: Grab bar Assist level to toilet: Touching or steadying assistance (Pt > 75%) Assist level from toilet: Touching or steadying assistance (Pt > 75%)  Function - Chair/bed transfer Chair/bed transfer activity did not occur: N/A Chair/bed transfer method: Ambulatory Chair/bed transfer assist level: Supervision or verbal cues Chair/bed transfer assistive device: Cane, Orthosis Chair/bed transfer details: Verbal cues for precautions/safety  Function - Locomotion: Wheelchair Will patient use wheelchair at discharge?: Yes Type: Manual Max wheelchair distance: 75 Assist Level: Supervision or  verbal cues Assist Level: Supervision or verbal cues Wheel 150 feet activity did not occur: Safety/medical concerns Turns around,maneuvers to table,bed, and toilet,negotiates 3% grade,maneuvers on rugs and over doorsills: No Function - Locomotion: Ambulation Assistive device: No device Max distance: 150 Assist level: Touching or steadying assistance (Pt > 75%) Assist level: Touching or steadying assistance (Pt > 75%) Walk 50 feet with 2 turns activity did not occur: Safety/medical concerns Assist level: Touching or steadying assistance (Pt > 75%) Walk 150 feet activity did not occur: Safety/medical concerns Assist level: Touching or steadying assistance (Pt > 75%) Walk 10 feet on uneven surfaces activity did not occur: Safety/medical concerns Assist level: Touching or steadying assistance (Pt > 75%)  Function - Comprehension Comprehension: Auditory Comprehension assist level: Follows complex conversation/direction with no assist  Function - Expression Expression: Verbal Expression assist level: Expresses complex ideas: With no assist  Function - Social Interaction Social Interaction assist level: Interacts appropriately with others - No medications needed.  Function - Problem Solving Problem solving assist level: Solves complex problems: Recognizes & self-corrects  Function - Memory  Memory assist level: More than reasonable amount of time Patient normally able to recall (first 3 days only): Current season, Location of own room, Staff names and faces, That he or she is in a hospital  Medical Problem List and Plan:  1. 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 Formulized individual treatment plan based on medical history, underlying  problem and comorbidities. 2. DVT Prophylaxis/Anticoagulation: Pharmaceutical: on warfarin for cardiac thrombus   INR therapeutic on 12/25 3. Pain Management: tylenol prn    When necessary Robaxin added for muscle tightness on 12/20-will not need on D/C 4. Mood: LCSW to follow for evaluation and support. Neuropsychology follow-up with the patient  5. Neuropsych: This patient is capable of making decisions on his own behalf.  6. Skin/Wound Care: Routine pressure relief measures.  7. Fluids/Electrolytes/Nutrition: Monitor I/O.    BMP within acceptable range on 12/20 8. Severe CAD with CM: Treated medically with ASA, statin , no angina  9. Acute systolic CHF: Was started spironolactone and digoxin on 12/7. Cardiology to follow, avoid hypotension/hypoperfusion.    Appreciate Cards recs, reviewed, continue medications.     Filed Weights   07/22/17 0630 07/23/17 0605 07/24/17 0556  Weight: 88.9 kg (195 lb 15.8 oz) 90.7 kg (199 lb 15.3 oz) 88.3 kg (194 lb 10.7 oz)    Fluctuating in 4lb range  Vitals:   07/24/17 1738 07/25/17 0641  BP: (!) 142/84 128/67  Pulse: 80 71  Resp:  18  Temp:  97.6 F (36.4 C)  SpO2:  96%   10.  Apical thrombus cont warfarin 11. Prediabetes: Hgb A1C- 6.0. Will have dietitian educate patient on CM diet--monitor BS for trends.    No meds 12. Polysubstance abuse: Has been counseled on importance of alcohol, cocaine and THC cessation-appreciate Neuropsych consult 13.  Insomnia - avoid benzos given substance abuse, prn trazodone   LOS (Days) 19 A FACE TO FACE EVALUATION WAS PERFORMED  Charlett Blake 07/25/2017, 7:51 AM

## 2017-07-25 NOTE — Progress Notes (Signed)
ANTICOAGULATION CONSULT NOTE - Follow Up Consult  Pharmacy Consult for warfarin Indication: stroke and LV thrombus  No Known Allergies  Patient Measurements: Height: 5\' 9"  (175.3 cm) Weight: 194 lb 10.7 oz (88.3 kg) IBW/kg (Calculated) : 70.7  Vital Signs: Temp: 97.6 F (36.4 C) (12/26 0641) Temp Source: Oral (12/26 0641) BP: 134/80 (12/26 0902) Pulse Rate: 96 (12/26 0902)  Labs: Recent Labs    07/23/17 0758 07/24/17 0726 07/25/17 1002  HGB 15.5 14.9  --   HCT 46.7 45.3  --   PLT 336 313  --   LABPROT 25.5* 26.4* 25.8*  INR 2.35 2.45 2.38    Estimated Creatinine Clearance: 114.1 mL/min (by C-G formula based on SCr of 0.87 mg/dL).  Assessment: 46 y/o M s/p acute embolic stroke, also found to have LV thrombus. Now on warfarin for anticoagulation. Patient seems to be very sensitive to warfarin and noted patient also on fenofibrate which can raise INR.   INR therapeutic at 2.38 today.On 12/25 CBC noted to be wnl and stable. No s/s bleeding documented. Good food intake (75-100% of meals)   Goal of Therapy:  INR goal 2-3 Monitor platelets by anticoagulation protocol: Yes   Plan:  Warfarin 2 mg PO x 1 tonight Daily INR, CBCs F/u s/sx bleeding, PO intake, DDI  Noah Delaineuth Shellby Schlink, RPh Clinical Pharmacist Pager: [phone] [id number] [phone] #[id number] [facility] [phone]  07/25/2017 1:26 PM

## 2017-07-25 NOTE — Progress Notes (Signed)
Occupational Therapy Session Note  Patient Details  Name: Angel Davies MRN: [id number] Date of Birth: 11/26/1970  Today's Date: 07/25/2017 OT Individual Time: 1500-1600 OT Individual Time Calculation (min): 60 min    Short Term Goals: Week 3:  OT Short Term Goal 1 (Week 3): STG=LTG 2.2 ELOS   Skilled Therapeutic Interventions/Progress Updates:    Upon entering the room, pt seated in recliner chair awaiting therapist arrival. Pt ambulating to therapy gym without use of AD and supervision for safety. Pt engaged in heavy weight bearing through L UE in quadruped. Pt lifting R UE and L UE while attempting to balance with assistance to remain upright while bearing weight. Pt also reaching forward to tap objects with R UE while in this position. Pt demonstrated ability to tie shoe laces this session with increased time and encouragement as well as button and unbutton pockets of shorts. Pt engaged in navigation task to main entrance with mod verbal guidance cues to locate entrance and pt ambulating 500' + without AD at supervision level. Pt taking seated rest break and ambulating on various surfaces before returning to unit with continued cues to trace path to find unit. OT discussed memory strategies to be used at home in various situations. Pt remained seated in recliner chair at end of session with call bell and all needed items within reach.   Therapy Documentation Precautions:  Precautions Precautions: Fall Restrictions Weight Bearing Restrictions: No General:   Vital Signs: Therapy Vitals Temp: 97.8 F (36.6 C) Temp Source: Oral Pulse Rate: 86 Resp: 16 BP: 136/62 Patient Position (if appropriate): Sitting Oxygen Therapy SpO2: 97 % O2 Device: Not Delivered Pain:   ADL:   Vision   Perception    Praxis   Exercises:   Other Treatments:    See Function Navigator for Current Functional Status.   Therapy/Group: Individual Therapy  Alen BleacherBradsher, Rhyder Koegel P 07/25/2017, 4:28  PM

## 2017-07-25 NOTE — Progress Notes (Signed)
Physical Therapy Session Note  Patient Details  Name: Angel Davies MRN: [id number] Date of Birth: Dec 18, 1970  Today's Date: 07/25/2017 PT Individual Time: 1000-1100 PT Individual Time Calculation (min): 60 min   Short Term Goals: Week 2:  PT Short Term Goal 1 (Week 2): =LTGs due to ELOS  Skilled Therapeutic Interventions/Progress Updates:    no c/o pain.  Session focus on NMR and balance.    Pt ambulates throughout unit with no AD and distant supervision.  nustep x8 minutes at level 5 for reciprocal stepping pattern retraining, activity tolerance, and LUE NMR.  Seated heel cord stretch bilat 3x30 seconds, standing runners stretch 2x30 seconds bilaterally and standing gastroc stretch 2x30 seconds bilaterally.  Pt completes 20 reps quadruped>push up>tall kneeling>quadruped focus on controlled movement and LUE activation.  Alternating cone taps 2x15 focus on weight shifting and SLS balance with min assist.  High level gait side stepping, tandem gait, toe walking, and retro stepping with min guard 2x30' for each exercise.    Pt returned to room at end of session and positioned in recliner with needs met.   Therapy Documentation Precautions:  Precautions Precautions: Fall Restrictions Weight Bearing Restrictions: No   See Function Navigator for Current Functional Status.   Therapy/Group: Individual Therapy  [name] 07/25/2017, 11:33 AM

## 2017-07-25 NOTE — Progress Notes (Signed)
Occupational Therapy Session Note  Patient Details  Name: Angel Davies MRN: [id number] Date of Birth: 21-Mar-1971  Today's Date: 07/25/2017 OT Individual Time: 1400-1430 OT Individual Time Calculation (min): 30 min    Short Term Goals: Week 1:  OT Short Term Goal 1 (Week 1): Pt will stand at sink to groom 2/2 items wiht supervision OT Short Term Goal 1 - Progress (Week 1): Met OT Short Term Goal 2 (Week 1): Pt will don shirt with supervision and no more than 1 cue for hemi techniques OT Short Term Goal 2 - Progress (Week 1): Met OT Short Term Goal 3 (Week 1): Pt will fasten pants to improve [place] wiht up to min A OT Short Term Goal 3 - Progress (Week 1): Met OT Short Term Goal 4 (Week 1): Pt will transfer to toilet/BSC with supervision and LRAD OT Short Term Goal 4 - Progress (Week 1): Progressing toward goal OT Short Term Goal 5 (Week 1): Pt will bathe 50% of body with LUE and AE PRN OT Short Term Goal 5 - Progress (Week 1): Met Week 2:  OT Short Term Goal 1 (Week 2): Pt will transfer to toilet/BSC with supervision and LRAD OT Short Term Goal 1 - Progress (Week 2): Met OT Short Term Goal 2 (Week 2): Pt will demonstrate proficiency in L UE self ROM OT Short Term Goal 2 - Progress (Week 2): Met OT Short Term Goal 3 (Week 2): Pt ambulate in room with LRAD  to collect clothing for BADL tasks with Min A OT Short Term Goal 3 - Progress (Week 2): Met Week 3:  OT Short Term Goal 1 (Week 3): STG=LTG 2.2 ELOS Week 4:     Skilled Therapeutic Interventions/Progress Updates:    1:1 focus on functional ambulation and activity tolerance from room to outside main entrance of hospital while carrying a weighted juggling ball in left hand. After seated rest break and d/c discussion and education on stroke signs and symptoms returned inside. This time ambulating up two flights of stairs with close supervision to min guard. 3 times left toe caught the stair. Pt able to ambulate back mod I to distant  supervision with one occasion of running into the wall on his left when trying to wall and answer his phone. Returned to his room mod I.   Therapy Documentation Precautions:  Precautions Precautions: Fall Restrictions Weight Bearing Restrictions: No Pain: No c/o pain  See Function Navigator for Current Functional Status.   Therapy/Group: Individual Therapy  [name] [facility] 07/25/2017, 2:55 PM

## 2017-07-26 ENCOUNTER — Inpatient Hospital Stay (HOSPITAL_COMMUNITY): Payer: Managed Care, Other (non HMO) | Admitting: Physical Therapy

## 2017-07-26 ENCOUNTER — Inpatient Hospital Stay (HOSPITAL_COMMUNITY): Payer: Managed Care, Other (non HMO) | Admitting: Occupational Therapy

## 2017-07-26 LAB — PROTIME-INR
INR: 2.17
Prothrombin Time: 24 seconds — ABNORMAL HIGH (ref 11.4–15.2)

## 2017-07-26 MED ORDER — WARFARIN SODIUM 2 MG PO TABS
2.0000 mg | ORAL_TABLET | Freq: Once | ORAL | Status: AC
  Administered 2017-07-26: 2 mg via ORAL
  Filled 2017-07-26: qty 1

## 2017-07-26 NOTE — Progress Notes (Signed)
Subjective/Complaints:  Awakens 3-4 am for no apparent reason otherwise no c/os  ROS: Denies nausea, vomiting, diarrhea, shortness of breath or chest pain   Objective: Vital Signs: Blood pressure (!) 103/49, pulse 75, temperature 98.2 F (36.8 C), temperature source Oral, resp. rate 16, height 5\' 9"  (1.753 m), weight 93.1 kg (205 lb 4 oz), SpO2 96 %. No results found. Results for orders placed or performed during the hospital encounter of 07/06/17 (from the past 72 hour(s))  Protime-INR     Status: Abnormal   Collection Time: 07/23/17  7:58 AM  Result Value Ref Range   Prothrombin Time 25.5 (H) 11.4 - 15.2 seconds   INR 2.35   CBC     Status: Abnormal   Collection Time: 07/23/17  7:58 AM  Result Value Ref Range   WBC 10.8 (H) 4.0 - 10.5 K/uL   RBC 5.09 4.22 - 5.81 MIL/uL   Hemoglobin 15.5 13.0 - 17.0 g/dL   HCT 40.946.7 81.139.0 - 91.452.0 %   MCV 91.7 78.0 - 100.0 fL   MCH 30.5 26.0 - 34.0 pg   MCHC 33.2 30.0 - 36.0 g/dL   RDW 78.213.4 95.611.5 - 21.315.5 %   Platelets 336 150 - 400 K/uL  Protime-INR     Status: Abnormal   Collection Time: 07/24/17  7:26 AM  Result Value Ref Range   Prothrombin Time 26.4 (H) 11.4 - 15.2 seconds   INR 2.45   CBC     Status: Abnormal   Collection Time: 07/24/17  7:26 AM  Result Value Ref Range   WBC 11.1 (H) 4.0 - 10.5 K/uL   RBC 4.95 4.22 - 5.81 MIL/uL   Hemoglobin 14.9 13.0 - 17.0 g/dL   HCT 08.645.3 57.839.0 - 46.952.0 %   MCV 91.5 78.0 - 100.0 fL   MCH 30.1 26.0 - 34.0 pg   MCHC 32.9 30.0 - 36.0 g/dL   RDW 62.913.4 52.811.5 - 41.315.5 %   Platelets 313 150 - 400 K/uL  Protime-INR     Status: Abnormal   Collection Time: 07/25/17 10:02 AM  Result Value Ref Range   Prothrombin Time 25.8 (H) 11.4 - 15.2 seconds   INR 2.38     Gen NAD. Vital signs reviewed.  HEENT: normal. Atraumatic Cardio: RRR. No JVD  Resp: CTA Bilaterally. Normal effort  GI: BS positive and ND Musc/Skel:  No tenderness. No edema.  Neuro: Alert and oriented Motor: LUE: 4-/5 proximal to distal  LLE:  HF, KE 4+/5, ADF/PF 3-/5, finger to thumb opposition all 4 fingers, +dysdiadochokinesis LUE and LLE foot/ankle Skin:   Intact. Warm and dry  Assessment/Plan: 1. 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 Physiatrist is providing close team supervision and 24 hour management of active medical problems listed below. Physiatrist and rehab team continue to assess barriers to discharge/monitor patient progress toward functional and medical goals. FIM: Function - Bathing Bathing activity did not occur: Refused Position: Shower Body parts bathed by patient: Left arm, Chest, Abdomen, Front perineal area, Buttocks, Left upper leg, Right upper leg, Right lower leg, Left lower leg, Right arm Body parts bathed by helper: Left lower leg, Right lower leg, Right arm, Back Bathing not applicable: Back Assist Level: Supervision or verbal cues  Function- Upper Body Dressing/Undressing What is the patient wearing?: Pull over shirt/dress Pull over shirt/dress - Perfomed by patient: Thread/unthread right sleeve, Thread/unthread left sleeve, Put head through  opening, Pull shirt over trunk Pull over shirt/dress - Perfomed by helper: Thread/unthread left sleeve Assist Level: Supervision or verbal cues Function - Lower Body Dressing/Undressing What is the patient wearing?: Socks, Shoes, Underwear, Pants Position: Sitting EOB Underwear - Performed by patient: Thread/unthread right underwear leg, Thread/unthread left underwear leg, Pull underwear up/down Underwear - Performed by helper: Thread/unthread left underwear leg, Pull underwear up/down Pants- Performed by patient: Thread/unthread right pants leg, Thread/unthread left pants leg, Pull pants up/down, Fasten/unfasten pants Pants- Performed by helper: Fasten/unfasten pants Non-skid slipper socks- Performed by helper: Don/doff right  sock, Don/doff left sock Socks - Performed by patient: Don/doff right sock, Don/doff left sock Socks - Performed by helper: Don/doff right sock, Don/doff left sock Shoes - Performed by patient: Don/doff right shoe Shoes - Performed by helper: Fasten right, Fasten left, Don/doff left shoe Assist for footwear: Supervision/touching assist Assist for lower body dressing: Touching or steadying assistance (Pt > 75%)  Function - Toileting Toileting activity did not occur: (no BM, used urinal) Toileting steps completed by patient: Adjust clothing prior to toileting, Performs perineal hygiene, Adjust clothing after toileting Toileting steps completed by helper: Adjust clothing prior to toileting, Adjust clothing after toileting Toileting Assistive Devices: Grab bar or rail Assist level: Touching or steadying assistance (Pt.75%)  Function - Archivist transfer activity did not occur: N/A Toilet transfer assistive device: Grab bar Assist level to toilet: Touching or steadying assistance (Pt > 75%) Assist level from toilet: Touching or steadying assistance (Pt > 75%)  Function - Chair/bed transfer Chair/bed transfer activity did not occur: N/A Chair/bed transfer method: Ambulatory Chair/bed transfer assist level: Supervision or verbal cues Chair/bed transfer assistive device: Cane, Orthosis Chair/bed transfer details: Verbal cues for precautions/safety  Function - Locomotion: Wheelchair Will patient use wheelchair at discharge?: Yes Type: Manual Max wheelchair distance: 75 Assist Level: Supervision or verbal cues Assist Level: Supervision or verbal cues Wheel 150 feet activity did not occur: Safety/medical concerns Turns around,maneuvers to table,bed, and toilet,negotiates 3% grade,maneuvers on rugs and over doorsills: No Function - Locomotion: Ambulation Assistive device: No device Max distance: 500' Assist level: Supervision or verbal cues Assist level: Supervision or  verbal cues Walk 50 feet with 2 turns activity did not occur: Safety/medical concerns Assist level: Supervision or verbal cues Walk 150 feet activity did not occur: Safety/medical concerns Assist level: Supervision or verbal cues Walk 10 feet on uneven surfaces activity did not occur: Safety/medical concerns Assist level: Supervision or verbal cues  Function - Comprehension Comprehension: Auditory Comprehension assist level: Follows complex conversation/direction with no assist  Function - Expression Expression: Verbal Expression assist level: Expresses complex ideas: With no assist  Function - Social Interaction Social Interaction assist level: Interacts appropriately with others - No medications needed.  Function - Problem Solving Problem solving assist level: Solves complex problems: Recognizes & self-corrects  Function - Memory Memory assist level: Requires cues to use assistive device Patient normally able to recall (first 3 days only): Current season, Location of own room, Staff names and faces, That he or she is in a hospital  Medical Problem List and Plan:  1. 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 DVT Prophylaxis/Anticoagulation: Pharmaceutical: on warfarin for cardiac thrombus   INR therapeutic on 12/26 3. Pain Management: tylenol prn    When necessary Robaxin added for muscle tightness on 12/20-will not need on D/C 4. Mood: LCSW to follow for evaluation and support.  Neuropsychology follow-up with the patient  5. Neuropsych: This patient is capable of making decisions on his own behalf.  6. Skin/Wound Care: Routine pressure relief measures.  7. Fluids/Electrolytes/Nutrition: Monitor I/O.    BMP within acceptable range on 12/20 8. Severe CAD with CM: Treated medically with ASA, statin , no angina  9. Acute systolic CHF: Was started spironolactone and digoxin on  12/7. Cardiology to follow, avoid hypotension/hypoperfusion.    Appreciate Cards recs, reviewed, continue medications.     Filed Weights   07/24/17 0556 07/25/17 0641 07/26/17 0500  Weight: 88.3 kg (194 lb 10.7 oz) 88.3 kg (194 lb 10.7 oz) 93.1 kg (205 lb 4 oz)    12/27 value appears spurious no peripheral edema or clinical change   Vitals:   07/25/17 1737 07/26/17 0500  BP: 110/70 (!) 103/49  Pulse: 90 75  Resp:  16  Temp:  98.2 F (36.8 C)  SpO2:  96%   10.  Apical thrombus cont warfarin 11. Prediabetes: Hgb A1C- 6.0. Will have dietitian educate patient on CM diet--monitor BS for trends.    No meds 12. Polysubstance abuse: Has been counseled on importance of alcohol, cocaine and THC cessation-appreciate Neuropsych consult 13.  Insomnia - avoid benzos given substance abuse, prn trazodone, should be better at home   LOS (Days) 20 A FACE TO FACE EVALUATION WAS PERFORMED  Angel Davies E Angel Davies 07/26/2017, 6:59 AM

## 2017-07-26 NOTE — Progress Notes (Signed)
Occupational Therapy Session Note  Patient Details  Name: Angel Davies MRN: [id number] Date of Birth: 01/12/1971  Today's Date: 07/26/2017 OT Individual Time: 1100-1200 OT Individual Time Calculation (min): 60 min    Short Term Goals: Week 3:  OT Short Term Goal 1 (Week 3): STG=LTG 2.2 ELOS  Skilled Therapeutic Interventions/Progress Updates:    Treatment session focused on ADLs/self care training, transfer training, safety awareness, community integration training, and discharge planning.  Pt agreeable to practicing dressing skills. Donned/doffed UB/LB clothing items and footwear at bed level with mod I. Pt completed functional mobility in room with mod I, noted impulsivity that required minimal verbal reminders. Pt aware of his impulsivity and deficits. Therapist and pt discuss discharge home and confirm home Is handicap accessible. Pt completed kitchen activity and utilized microwave to make hot cup of tea with mod I level. No c/o pain at this time.   Therapy Documentation Precautions:  Precautions Precautions: Fall Precaution Comments: poor safety awareness Restrictions Weight Bearing Restrictions: No Pain: Pain Assessment Pain Assessment: No/denies pain ADL: ADL Eating: Modified independent Where Assessed-Eating: Chair Grooming: Modified independent Where Assessed-Grooming: Standing at sink Upper Body Bathing: Modified independent Where Assessed-Upper Body Bathing: Shower Lower Body Bathing: Modified independent Where Assessed-Lower Body Bathing: Shower Upper Body Dressing: Modified independent (Device) Where Assessed-Upper Body Dressing: Edge of bed Lower Body Dressing: Modified independent Where Assessed-Lower Body Dressing: Edge of bed Toileting: Modified independent Where Assessed-Toileting: Teacher, adult educationToilet Toilet Transfer: Engineer, agriculturalModified independent Toilet Transfer Method: ProofreaderAmbulating Toilet Transfer Equipment: Engineer, technical salesGrab bars Tub/Shower Transfer: Modified independent Glass blower/designerTub/Shower  Transfer Method: Ship brokerAmbulating Tub/Shower Equipment: Information systems managerhower seat with back, AcupuncturistGrab bars Walk-In Shower Transfer: Modified independent Film/video editorWalk-In Shower Transfer Method: Designer, industrial/productAmbulating Walk-In Shower Equipment: Information systems managerhower seat with back Vision Baseline Vision/History: No visual deficits Patient Visual Report: No change from baseline Vision Assessment?: No apparent visual deficits Eye Alignment: Within Functional Limits Convergence: Within functional limits Perception  Perception: Within Functional Limits Praxis Praxis: Intact  See Function Navigator for Current Functional Status.   Therapy/Group: Individual Therapy  Verdie ShireMolly  Miguelina Fore 07/26/2017, 12:33 PM

## 2017-07-26 NOTE — Plan of Care (Signed)
Discharge completed on 12/27

## 2017-07-26 NOTE — Progress Notes (Signed)
ANTICOAGULATION CONSULT NOTE - Follow Up Consult  Pharmacy Consult for warfarin Indication: stroke and LV thrombus  No Known Allergies  Patient Measurements: Height: 5\' 9"  (175.3 cm) Weight: 205 lb 4 oz (93.1 kg) IBW/kg (Calculated) : 70.7  Vital Signs: Temp: 98.2 F (36.8 C) (12/27 0500) Temp Source: Oral (12/27 0500) BP: 103/49 (12/27 0500) Pulse Rate: 75 (12/27 0500)  Labs: Recent Labs    07/24/17 0726 07/25/17 1002 07/26/17 0644  HGB 14.9  --   --   HCT 45.3  --   --   PLT 313  --   --   LABPROT 26.4* 25.8* 24.0*  INR 2.45 2.38 2.17    Estimated Creatinine Clearance: 117.1 mL/min (by C-G formula based on SCr of 0.87 mg/dL).  Assessment: 46 y/o M s/p acute embolic stroke, also found to have LV thrombus. Now on warfarin for anticoagulation. Patient seems to be very sensitive to warfarin and noted patient also on fenofibrate which can raise INR.   INR remains therapeutic at 2.17. On 12/25 CBC noted to be wnl and stable. No s/s bleeding documented. Eating well  Goal of Therapy:  INR goal 2-3 Monitor platelets by anticoagulation protocol: Yes   Plan:  Warfarin 2 mg PO x 1 tonight Daily INR, CBCs F/u s/sx bleeding, PO intake, DDI  Noah Delaineuth Hazyl Marseille, RPh Clinical Pharmacist Pager: [phone] #[id number] [facility] [phone]  07/26/2017 3:09 PM

## 2017-07-26 NOTE — Progress Notes (Signed)
Physical Therapy Session Note  Patient Details  Name: Angel Davies MRN: [id number] Date of Birth: 1971-06-22  Today's Date: 07/26/2017 PT Individual Time: 1450-1600 PT Individual Time Calculation (min): 70 min   Short Term Goals: Week 2:  PT Short Term Goal 1 (Week 2): =LTGs due to ELOS  Skilled Therapeutic Interventions/Progress Updates:   Pt sitting EOB upon arrival and agreeable to therapy, no c/o pain. Worked on endurance w/ gait and dynamic gait this session in a community environment. Ambulated in multiple [id number]' bouts around hospital at [facility] I level w/o AD. Ambulated on uneven surfaces and up and down 8 steps outside at Mod I level. Seated rest breaks in between bouts 2/2 fatigue and increased LE ataxia w/ fatigue. Returned to unit and worked on Automotive engineer on airex pad w/ supervision while performing cognitive task w/ occasional verbal cues. Returned to room and ended session sitting in w/c, call bell within reach and all needs met.   Therapy Documentation Precautions:  Precautions Precautions: Fall Precaution Comments: poor safety awareness Restrictions Weight Bearing Restrictions: No  See Function Navigator for Current Functional Status.   Therapy/Group: Individual Therapy  Trinten Boudoin K Arnette 07/26/2017, 4:36 PM

## 2017-07-26 NOTE — Progress Notes (Signed)
Occupational Therapy Discharge Summary  Patient Details  Name: Angel Davies MRN: [id number] Date of Birth: 1970-12-12  Today's Date: 07/26/2017 OT Individual Time: 1100-1200 OT Individual Time Calculation (min): 60 min    Patient has met 11 of 11 long term goals due to improved activity tolerance, improved balance, postural control, functional use of  LEFT upper extremity, improved attention, improved awareness and improved coordination.  Patient to discharge at overall Modified Independent level.  Patient's care partner is independent to provide the necessary physical assistance at discharge.    Reasons goals not met: n/a  Recommendation:  Patient will benefit from ongoing skilled OT services in home health setting to continue to advance functional skills in the area of BADL.  Equipment: No equipment provided  Reasons for discharge: treatment goals met and discharge from hospital  Patient/family agrees with progress made and goals achieved: Yes  OT Discharge Precautions/Restrictions  Precautions Precautions: Fall Precaution Comments: poor safety awareness Restrictions Weight Bearing Restrictions: No Pain Pain Assessment Pain Assessment: No/denies pain ADL ADL Eating: Modified independent Where Assessed-Eating: Chair Grooming: Modified independent Where Assessed-Grooming: Standing at sink Upper Body Bathing: Modified independent Where Assessed-Upper Body Bathing: Shower Lower Body Bathing: Modified independent Where Assessed-Lower Body Bathing: Shower Upper Body Dressing: Modified independent (Device) Where Assessed-Upper Body Dressing: Edge of bed Lower Body Dressing: Modified independent Where Assessed-Lower Body Dressing: Edge of bed Toileting: Modified independent Where Assessed-Toileting: Glass blower/designer: Diplomatic Services operational officer Method: Counselling psychologist: Energy manager: Modified independent Electrical engineer Method: Optometrist: Civil engineer, contracting with back, Energy manager: Modified independent Social research officer, government Method: Heritage manager: Civil engineer, contracting with back Vision Baseline Vision/History: No visual deficits Patient Visual Report: No change from baseline Vision Assessment?: No apparent visual deficits Eye Alignment: Within Functional Limits Convergence: Within functional limits Perception  Perception: Within Functional Limits Praxis Praxis: Intact Cognition Overall Cognitive Status: Within Functional Limits for tasks assessed Arousal/Alertness: Awake/alert Orientation Level: Oriented X4 Attention: Alternating Alternating Attention: Appears intact Memory: Appears intact Awareness: Appears intact Problem Solving: Appears intact Executive Function: Reasoning Reasoning: Appears intact Behaviors: Impulsive Safety/Judgment: Impaired Comments: safety awareness seems largely intact, however pt continues to be somewhat impulsive Sensation Sensation Light Touch: Impaired by gross assessment(pt reports some N/T in L lateral border of lower leg and foot, and some N/T in in fingertips on L hand) Hot/Cold: Appears Intact Additional Comments: mild ataxia with improvement in L UE/LLE Coordination Gross Motor Movements are Fluid and Coordinated: Yes Fine Motor Movements are Fluid and Coordinated: No Coordination and Movement Description: continuing to improve L UE dexterity/ [facility]  Finger Nose Finger Test: mild dysmetria on L, normal on R Heel Shin Test: R=L Motor  Motor Motor: Hemiplegia;Ataxia Motor - Skilled Clinical Observations: L sided mild hemi plegia  Motor - Discharge Observations: improving L hemiparesis UE>LE  Mobility  Bed Mobility Bed Mobility: Rolling Right;Rolling Left;Supine to Sit;Sit to Supine Rolling Right: 6: Modified independent (Device/Increase time) Rolling Left: 6: Modified independent  (Device/Increase time) Supine to Sit: 6: Modified independent (Device/Increase time) Sit to Supine: 6: Modified independent (Device/Increase time) Transfers Transfers: Sit to Stand;Stand to Sit Sit to Stand: 6: Modified independent (Device/Increase time) Stand to Sit: 6: Modified independent (Device/Increase time)  Trunk/Postural Assessment  Cervical Assessment Cervical Assessment: Within Functional Limits Thoracic Assessment Thoracic Assessment: Within Functional Limits Lumbar Assessment Lumbar Assessment: Within Functional Limits Postural Control Postural Control: Within Functional Limits  Balance Balance Balance Assessed: Yes Berg Balance Test  Sit to Stand: Able to stand without using hands and stabilize independently Standing Unsupported: Able to stand safely 2 minutes Sitting with Back Unsupported but Feet Supported on Floor or Stool: Able to sit safely and securely 2 minutes Stand to Sit: Sits safely with minimal use of hands Transfers: Able to transfer safely, minor use of hands Standing Unsupported with Eyes Closed: Able to stand 10 seconds safely Standing Ubsupported with Feet Together: Able to place feet together independently and stand for 1 minute with supervision From Standing, Reach Forward with Outstretched Arm: Can reach confidently >25 cm (10") From Standing Position, Pick up Object from Floor: Able to pick up shoe safely and easily From Standing Position, Turn to Look Behind Over each Shoulder: Looks behind from both sides and weight shifts well Turn 360 Degrees: Able to turn 360 degrees safely in 4 seconds or less Standing Unsupported, Alternately Place Feet on Step/Stool: Able to complete 4 steps without aid or supervision Standing Unsupported, One Foot in Front: Able to take small step independently and hold 30 seconds Standing on One Leg: Able to lift leg independently and hold equal to or more than 3 seconds Total Score: 49 Static Sitting Balance Static  Sitting - Level of Assistance: 6: Modified independent (Device/Increase time) Dynamic Sitting Balance Dynamic Sitting - Level of Assistance: 6: Modified independent (Device/Increase time) Static Standing Balance Static Standing - Level of Assistance: 6: Modified independent (Device/Increase time) Dynamic Standing Balance Dynamic Standing - Level of Assistance: 6: Modified independent (Device/Increase time) Extremity/Trunk Assessment RUE Assessment RUE Assessment: Within Functional Limits LUE Assessment LUE Assessment: Exceptions to WFL(L UE within functional limits for simple tasks; noted deficits with pincer grasp and strength. Able to use his  UE for dressing, bathing, and toileting tasks.Marland Kitchen strong willed pt to continue to work on L UE)   See Function Navigator for Current Functional Status.  Delon [place] 07/26/2017, 12:44 PM

## 2017-07-26 NOTE — Progress Notes (Signed)
Physical Therapy Discharge Summary  Patient Details  Name: Angel Davies MRN: [id number] Date of Birth: [birth year]  Today's Date: 07/26/2017 PT Individual Time: 1000-1100 PT Individual Time Calculation (min): 60 min    Patient has met 12 of 12 long term goals due to improved activity tolerance, improved balance, improved postural control, increased strength, ability to compensate for deficits, functional use of  left upper extremity and left lower extremity and improved attention.  Patient to discharge at an ambulatory level supervision>mod I.    Recommendation:  Patient will benefit from ongoing skilled PT services in outpatient setting to continue to advance safe functional mobility, address ongoing impairments in balance, postural control, coordination, and safety awareness, and minimize fall risk.  Equipment: small based quad cane  Reasons for discharge: treatment goals met  Patient/family agrees with progress made and goals achieved: Yes   Skilled PT Intervention: No c/o pain.  Session focus on d/c assessment, balance, and activity tolerance.   Pt ambulates throughout unit mod I and transfers mod I.  Stair negotiation with distant supervision/mod I.  PT provided education regarding safe progression of mobility and activity once d/c'd as well as education on f/u.  Pt verbalized understanding.    Pt engaged in Wii bowling game, focus on sit<>stands without UE support, LUE use, and dynamic standing balance with stepping strategies.    Pt handoff to OT at end of session.   PT Discharge Precautions/Restrictions Precautions Precautions: Fall Precaution Comments: poor safety awareness Restrictions Weight Bearing Restrictions: No Vision/Perception  Perception Perception: Within Functional Limits Praxis Praxis: Intact  Cognition Overall Cognitive Status: Within Functional Limits for tasks assessed Arousal/Alertness: Awake/alert Orientation Level: Oriented X4 Attention:  Alternating Alternating Attention: Appears intact Memory: Appears intact Awareness: Appears intact Problem Solving: Appears intact Safety/Judgment: Other (comment) Comments: safety awareness seems largely intact, however pt continues to be somewhat impulsive Sensation Sensation Light Touch: Impaired by gross assessment(pt reports some N/T in L lateral border of lower leg and foot, and some N/T in in fingertips on L hand) Coordination Gross Motor Movements are Fluid and Coordinated: Yes Fine Motor Movements are Fluid and Coordinated: No Coordination and Movement Description: ongoing mild ataxia on LLE Finger Nose Finger Test: mild dysmetria on L, normal on R Heel Shin Test: R=L Motor  Motor Motor: Hemiplegia;Ataxia Motor - Discharge Observations: improving L hemiparesis, UE>LE, mild ataxia on LLE  Mobility Bed Mobility Bed Mobility: Rolling Right;Rolling Left;Supine to Sit;Sit to Supine Rolling Right: 6: Modified independent (Device/Increase time) Rolling Left: 6: Modified independent (Device/Increase time) Supine to Sit: 6: Modified independent (Device/Increase time) Sit to Supine: 6: Modified independent (Device/Increase time) Transfers Transfers: Yes Sit to Stand: 6: Modified independent (Device/Increase time) Stand to Sit: 6: Modified independent (Device/Increase time) Stand Pivot Transfers: 6: Modified independent (Device/Increase time) Locomotion  Ambulation Ambulation: Yes Ambulation/Gait Assistance: 6: Modified independent (Device/Increase time);5: Supervision Ambulation Distance (Feet): 150 Feet Assistive device: None Ambulation/Gait Assistance Details: mod I for controlled environments, supervision for community level ambulation Gait Gait: Yes Gait Pattern: Impaired Gait Pattern: Ataxic Gait velocity: LLE ataxia  Stairs / Additional Locomotion Stairs: Yes Stairs Assistance: 6: Modified independent (Device/Increase time) Stair Management Technique: One rail  Right Number of Stairs: 16 Height of Stairs: 6 Ramp: 6: Modified independent (Device) Curb: 5: Building services engineer Mobility: No  Trunk/Postural Assessment  Cervical Assessment Cervical Assessment: Within Functional Limits Thoracic Assessment Thoracic Assessment: Within Functional Limits Lumbar Assessment Lumbar Assessment: Within Functional Limits Postural Control Postural Control: Within Functional Limits  Balance  Balance Balance Assessed: Yes Berg Balance Test Sit to Stand: Able to stand without using hands and stabilize independently Standing Unsupported: Able to stand safely 2 minutes Sitting with Back Unsupported but Feet Supported on Floor or Stool: Able to sit safely and securely 2 minutes Stand to Sit: Sits safely with minimal use of hands Transfers: Able to transfer safely, minor use of hands Standing Unsupported with Eyes Closed: Able to stand 10 seconds safely Standing Ubsupported with Feet Together: Able to place feet together independently and stand for 1 minute with supervision From Standing, Reach Forward with Outstretched Arm: Can reach confidently >25 cm (10") From Standing Position, Pick up Object from Floor: Able to pick up shoe safely and easily From Standing Position, Turn to Look Behind Over each Shoulder: Looks behind from both sides and weight shifts well Turn 360 Degrees: Able to turn 360 degrees safely in 4 seconds or less Standing Unsupported, Alternately Place Feet on Step/Stool: Able to complete 4 steps without aid or supervision Standing Unsupported, One Foot in Front: Able to take small step independently and hold 30 seconds Standing on One Leg: Able to lift leg independently and hold equal to or more than 3 seconds Total Score: 49 Extremity Assessment  RUE Assessment RUE Assessment: Within Functional Limits LUE Assessment LUE Assessment: Exceptions to WFL(L UE within functional limits for simple tasks; noted deficits with  pincer grasp and strength. Able to use his  UE for dressing, bathing, and toileting tasks.Marland Kitchen strong willed pt to continue to work on L UE) RLE Assessment RLE Assessment: Within Functional Limits(5/5 proximal to distal) LLE Strength Left Hip Flexion: 4/5 Left Knee Flexion: 4+/5 Left Knee Extension: 4/5 Left Ankle Dorsiflexion: 4/5 Left Ankle Plantar Flexion: 4/5   See Function Navigator for Current Functional Status.  [name] 07/26/2017, 2:17 PM

## 2017-07-27 DIAGNOSIS — Z5181 Encounter for therapeutic drug level monitoring: Secondary | ICD-10-CM | POA: Insufficient documentation

## 2017-07-27 LAB — PROTIME-INR
INR: 2.19
PROTHROMBIN TIME: 24.2 s — AB (ref 11.4–15.2)

## 2017-07-27 MED ORDER — FOLIC ACID 1 MG PO TABS: 1.0000 mg | ORAL_TABLET | Freq: Every day | ORAL | 0 refills | Status: AC

## 2017-07-27 MED ORDER — LOSARTAN POTASSIUM 25 MG PO TABS
25.0000 mg | ORAL_TABLET | Freq: Every day | ORAL | 0 refills | Status: DC
Start: 2017-07-27 — End: 2017-08-07

## 2017-07-27 MED ORDER — SPIRONOLACTONE 25 MG PO TABS: 12.5000 mg | ORAL_TABLET | Freq: Every day | ORAL | 0 refills | Status: DC

## 2017-07-27 MED ORDER — CARVEDILOL 3.125 MG PO TABS: 3.1250 mg | ORAL_TABLET | Freq: Two times a day (BID) | ORAL | 0 refills | Status: DC

## 2017-07-27 MED ORDER — FENOFIBRATE 160 MG PO TABS: 160.0000 mg | ORAL_TABLET | Freq: Every day | ORAL | 0 refills | Status: DC

## 2017-07-27 MED ORDER — DIGOXIN 125 MCG PO TABS: 0.1250 mg | ORAL_TABLET | Freq: Every day | ORAL | 0 refills | Status: DC

## 2017-07-27 MED ORDER — ACETAMINOPHEN 500 MG PO TABS: 500.0000 mg | ORAL_TABLET | Freq: Four times a day (QID) | ORAL | 0 refills | Status: DC | PRN

## 2017-07-27 MED ORDER — ATORVASTATIN CALCIUM 80 MG PO TABS: 80.0000 mg | ORAL_TABLET | Freq: Every day | ORAL | 0 refills | Status: DC

## 2017-07-27 MED ORDER — WARFARIN SODIUM 2 MG PO TABS: ORAL_TABLET | ORAL | 0 refills | Status: DC

## 2017-07-27 MED ORDER — ADULT MULTIVITAMIN W/MINERALS CH: 1.0000 | ORAL_TABLET | Freq: Every day | ORAL | 0 refills | Status: AC

## 2017-07-27 MED ORDER — THIAMINE HCL 100 MG PO TABS: 100.0000 mg | ORAL_TABLET | Freq: Every day | ORAL | 0 refills | Status: DC

## 2017-07-27 MED ORDER — NICOTINE 14 MG/24HR TD PT24: 14.0000 mg | MEDICATED_PATCH | Freq: Every day | TRANSDERMAL | 0 refills | Status: DC

## 2017-07-27 NOTE — Progress Notes (Signed)
Subjective/Complaints:  No issues overnite.  ROS: Denies nausea, vomiting, diarrhea, shortness of breath or chest pain   Objective: Vital Signs: Blood pressure 110/73, pulse 98, temperature 97.7 F (36.5 C), temperature source Oral, resp. rate 16, height 5\' 9"  (1.753 m), weight 93.1 kg (205 lb 4 oz), SpO2 98 %. No results found. Results for orders placed or performed during the hospital encounter of 07/06/17 (from the past 72 hour(s))  Protime-INR     Status: Abnormal   Collection Time: 07/24/17  7:26 AM  Result Value Ref Range   Prothrombin Time 26.4 (H) 11.4 - 15.2 seconds   INR 2.45   CBC     Status: Abnormal   Collection Time: 07/24/17  7:26 AM  Result Value Ref Range   WBC 11.1 (H) 4.0 - 10.5 K/uL   RBC 4.95 4.22 - 5.81 MIL/uL   Hemoglobin 14.9 13.0 - 17.0 g/dL   HCT 16.145.3 09.639.0 - 04.552.0 %   MCV 91.5 78.0 - 100.0 fL   MCH 30.1 26.0 - 34.0 pg   MCHC 32.9 30.0 - 36.0 g/dL   RDW 40.913.4 81.111.5 - 91.415.5 %   Platelets 313 150 - 400 K/uL  Protime-INR     Status: Abnormal   Collection Time: 07/25/17 10:02 AM  Result Value Ref Range   Prothrombin Time 25.8 (H) 11.4 - 15.2 seconds   INR 2.38   Protime-INR     Status: Abnormal   Collection Time: 07/26/17  6:44 AM  Result Value Ref Range   Prothrombin Time 24.0 (H) 11.4 - 15.2 seconds   INR 2.17     Gen NAD. Vital signs reviewed.  HEENT: normal. Atraumatic Cardio: RRR. No JVD  Resp: CTA Bilaterally. Normal effort  GI: BS positive and ND Musc/Skel:  No tenderness. No edema.  Neuro: Alert and oriented Motor: LUE: 4-/5 proximal to distal  LLE: HF, KE 4+/5, ADF/PF 3-/5, finger to thumb opposition all 4 fingers, +dysdiadochokinesis LUE and LLE foot/ankle Skin:   Intact. Warm and dry  Assessment/Plan: 1. 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 Stable for D/C today F/u PCP in 3-4 weeks Cardiac f/u Dr Jones BroomBensihmon F/u PM&R 2 weeks See D/C summary See D/C instructions FIM: Function - Bathing Bathing  activity did not occur: Refused Position: Shower Body parts bathed by patient: Left arm, Chest, Abdomen, Front perineal area, Buttocks, Left upper leg, Right upper leg, Right lower leg, Left lower leg, Right arm Body parts bathed by helper: Left lower leg, Right lower leg, Right arm, Back Bathing not applicable: Back Assist Level: Supervision or verbal cues  Function- Upper Body Dressing/Undressing What is the patient wearing?: Pull over shirt/dress Pull over shirt/dress - Perfomed by patient: Thread/unthread right sleeve, Thread/unthread left sleeve, Put head through opening, Pull shirt over trunk Pull over shirt/dress - Perfomed by helper: Thread/unthread left sleeve Assist Level: No help, No cues Function - Lower Body Dressing/Undressing What is the patient wearing?: Pants, Socks, Shoes Position: Sitting EOB Underwear - Performed by patient: Thread/unthread right underwear leg, Thread/unthread left underwear leg, Pull underwear up/down Underwear - Performed by helper: Thread/unthread left underwear leg, Pull underwear up/down Pants- Performed by patient: Thread/unthread right pants leg, Thread/unthread left pants leg, Pull pants up/down, Fasten/unfasten pants Pants- Performed by helper: Fasten/unfasten pants Non-skid slipper socks- Performed by helper: Don/doff right sock, Don/doff left sock Socks - Performed by patient: Don/doff right sock, Don/doff left sock Socks - Performed by helper: Don/doff right sock, Don/doff left sock Shoes - Performed by patient:  Don/doff right shoe Shoes - Performed by helper: Fasten right, Fasten left, Don/doff left shoe, Don/doff right shoe Assist for footwear: Independent Assist for lower body dressing: No Help, No cues  Function - Toileting Toileting activity did not occur: (no BM, used urinal) Toileting steps completed by patient: Adjust clothing prior to toileting, Performs perineal hygiene, Adjust clothing after toileting Toileting steps completed  by helper: Adjust clothing prior to toileting, Adjust clothing after toileting Toileting Assistive Devices: Grab bar or rail Assist level: Touching or steadying assistance (Pt.75%)  Function - ArchivistToilet Transfers Toilet transfer activity did not occur: N/A Toilet transfer assistive device: Grab bar Assist level to toilet: Touching or steadying assistance (Pt > 75%) Assist level from toilet: Touching or steadying assistance (Pt > 75%)  Function - Chair/bed transfer Chair/bed transfer activity did not occur: N/A Chair/bed transfer method: Ambulatory Chair/bed transfer assist level: No Help, no cues, assistive device, takes more than a reasonable amount of time Chair/bed transfer assistive device: Cane, Orthosis Chair/bed transfer details: Verbal cues for precautions/safety  Function - Locomotion: Wheelchair Will patient use wheelchair at discharge?: Yes Type: Manual Max wheelchair distance: 75 Assist Level: Supervision or verbal cues Assist Level: Supervision or verbal cues Wheel 150 feet activity did not occur: Safety/medical concerns Turns around,maneuvers to table,bed, and toilet,negotiates 3% grade,maneuvers on rugs and over doorsills: No Function - Locomotion: Ambulation Assistive device: No device Max distance: 1000' Assist level: No help, No cues, assistive device, takes more than a reasonable amount of time Assist level: No help, No cues, assistive device, takes more than a reasonable amount of time Walk 50 feet with 2 turns activity did not occur: Safety/medical concerns Assist level: No help, No cues, assistive device, takes more than a reasonable amount of time Walk 150 feet activity did not occur: Safety/medical concerns Assist level: No help, No cues, assistive device, takes more than a reasonable amount of time Walk 10 feet on uneven surfaces activity did not occur: Safety/medical concerns Assist level: No help, No cues, assistive device, takes more than a reasonable  amount of time  Function - Comprehension Comprehension: Auditory Comprehension assist level: Follows complex conversation/direction with no assist  Function - Expression Expression: Verbal Expression assist level: Expresses complex ideas: With no assist  Function - Social Interaction Social Interaction assist level: Interacts appropriately with others - No medications needed.  Function - Problem Solving Problem solving assist level: Solves complex problems: Recognizes & self-corrects  Function - Memory Memory assist level: Requires cues to use assistive device Patient normally able to recall (first 3 days only): Current season, Location of own room, Staff names and faces, That he or she is in a hospital  Medical Problem List and Plan:  1. 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 DVT Prophylaxis/Anticoagulation: Pharmaceutical: on warfarin for cardiac thrombus   INR therapeutic last several days 3. Pain Management: tylenol prn    When necessary Robaxin added for muscle tightness on 12/20-will not need on D/C 4. Mood: LCSW to follow for evaluation and support. Neuropsychology follow-up with the patient  5. Neuropsych: This patient is capable of making decisions on his own behalf.  6. Skin/Wound Care: Routine pressure relief measures.  7. Fluids/Electrolytes/Nutrition: Monitor I/O.    BMP within acceptable range on 12/20 8. Severe CAD with CM: Treated medically with ASA, statin , no angina  9. Acute systolic CHF: Was started spironolactone and digoxin on 12/7. Cardiology to  follow, avoid hypotension/hypoperfusion.    Appreciate Cards recs, reviewed, continue medications.     Filed Weights   07/25/17 0641 07/26/17 0500 07/27/17 0500  Weight: 88.3 kg (194 lb 10.7 oz) 93.1 kg (205 lb 4 oz) 93.1 kg (205 lb 4 oz)    12/27 value appears spurious no peripheral edema or clinical  change   Vitals:   07/26/17 0500 07/27/17 0500  BP: (!) 103/49 110/73  Pulse: 75 98  Resp: 16 16  Temp: 98.2 F (36.8 C) 97.7 F (36.5 C)  SpO2: 96% 98%   10.  Apical thrombus cont warfarin 11. Prediabetes: Hgb A1C- 6.0. Will have dietitian educate patient on CM diet--monitor BS for trends.    No meds 12. Polysubstance abuse: Has been counseled on importance of alcohol, cocaine and THC cessation-appreciate Neuropsych consult 13.  Insomnia - avoid benzos given substance abuse, prn trazodone, should be better at home   LOS (Days) 21 A FACE TO FACE EVALUATION WAS PERFORMED  Erick Colace 07/27/2017, 7:13 AM

## 2017-07-27 NOTE — Discharge Summary (Signed)
Physician Discharge Summary  Patient ID: Angel Davies MRN: [id number] DOB/AGE: 48/11/1970 48 y.o.  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 A dedicated cardiac coil was used. Functional imaging was done using Fiesta sequences. 2,3, and 4 chamber views were done to assess for RWMA's. Modified Simpson's rule using a short axis stack was used to calculate an ejection fraction on a dedicated work Research officer, trade unionstation using Circle software. The patient received 30 cc of Multihance. After 10 minutes inversion recovery sequences were used to assess for infiltration and scar tissue. CONTRAST:  30 cc Multihance FINDINGS: Atrial were normal in size. RV normal in size and function No pericardial effusion Normal AV and aortic root. Trivial MR. The LV was severely dilated. There was diffuse hypokinesis with akinesis of the septum and apex. There was a small apical thrombus measuring 11 mm x 8 mm. The quantitative EF was 18% (EDV 158 cc ESV 128 cc SV 28 cc) Delayed enhancement images showed full thickness scar involving the mid and distal septum and apex. There was subendocardial scar involving the basal inferior lateral wall. IMPRESSION: 1) Severe LVE with EF 18% septal and apical akinesis diffuse hypokinesis 2) Apical thrombus measuring 11 mm x 8 mm 3) Full thickness scar involving the mid and distal septum and  apex Subendocardial scar involving the basal inferior lateral wall Charlton HawsPeter Nishan Electronically Signed   By: Charlton HawsPeter  Nishan M.D.   On: 07/06/2017 15:57     Labs:  Basic Metabolic Panel: BMP Latest Ref Rng & Units 07/19/2017 07/13/2017 07/11/2017  Glucose 65 - 99 mg/dL 409(W121(H) 119(J120(H) 478(G116(H)  BUN 6 - 20 mg/dL 14 14 14   Creatinine 0.61 - 1.24 mg/dL 9.560.87 2.130.91 0.861.01  Sodium 135 - 145 mmol/L 138 139 136  Potassium 3.5 - 5.1 mmol/L 4.0 4.0 3.9  Chloride 101 - 111 mmol/L 105 105 103  CO2 22 - 32 mmol/L 26 25 24   Calcium 8.9 - 10.3 mg/dL 9.3 9.3 9.2    CBC: CBC Latest Ref Rng & Units 07/24/2017 07/23/2017 07/22/2017  WBC 4.0 - 10.5 K/uL 11.1(H) 10.8(H) 10.7(H)  Hemoglobin 13.0 - 17.0 g/dL 57.814.9 46.915.5 62.916.0  Hematocrit 39.0 - 52.0 % 45.3 46.7 47.9  Platelets 150 - 400 K/uL 313 336 352    CBG: No results for input(s): GLUCAP in the last 168 hours.  . Lab Results  Component Value Date   INR 2.19 07/27/2017   INR 2.17 07/26/2017   INR 2.38 07/25/2017    Brief HPI:   Angel Davies is a 46 y.o. male with history of ADHD, anxiety, depression/anxiety, fall 12/2016 with loss of peripheral vision and onset of HA, polysubtance abuse who was admitted on 07/02/17 with fall, slurred speech and left sided weakness. Patient reported that he was drinking ETOH and used cocaine the night before--UDS positive for cocaine and THC. MRI brain revealed "Acute RIGHT pontine infarct, Subacute small RIGHT temporal occipital lobe/PCA  territory infarct and severe stenosis distal rigth P2 segment.  2D echo showed EF 20-25% with moderately dilated LV, akinesis of anterior and apical myocardium with grade 2 diastolic dysfunction.   He was evaluated by Dr. Gala Romney and underwent cardiac cath on 12/6 revealing severe CAD with occluded mid LAD and well compensated filling pressures with moderately reduced cardiac output. Cardiac MRI revealed severe left ventricular dysfunction with ejection fraction of 18%, apical thrombus,  diffuse hypokinesis and a full-thickness scar involving the left mid, distal septum and apex and a subendocardial scar involving the basal inferior lateral wall was seen.  Patient is to be treated medically and maintained on heparin/coumadin bridge. CIR was recommended due to functional deficits.     Hospital Course: Angel Davies was admitted to rehab 07/06/2017 for inpatient therapies to consist of PT, ST and OT at least three hours five days a week. Past admission physiatrist, therapy team and rehab RN have worked together to provide customized collaborative inpatient rehab.  Low dose losartan and coreg were added per cardiology input. Blood pressures were monitored on bid basis and have been well controlled. Respiratory status has been stable and no signs of fluid overload noted. He has been transitioned to coumadin and is to follow up with [facility] coumadin clinic next week for management and protime draw.  Coumadin is therapeutic at 2.19 and he was discharged on 2 mg all days except for 3 mg on Saturdays. Serial CBC showed H/H is stable and persistent leucocytosis noted without signs of infection. He did develop hemorrhoids due to constipation and bowel program was augmented with good results.  He is continent of bowel and bladder. He has been educated on Carb modified and heart healthy diet. His mood has been stable and he was counseled on importance of cocaine and alcohol cessation.  He has made good progress during his rehab stay and is modified independent at wheelchair level. He will continue to receive outpatient PT and OT at [facility] after discharge.    Rehab course: During patient's stay in rehab weekly team conferences were held to monitor patient's progress, set goals and discuss barriers to discharge. At admission, patient required min to mod assist with basic self care tasks and mod assist with mobility. He displayed mild dysarthria with MoCA score 27/30 and was distracted and  tangential during evaluation. He has had improvement in activity tolerance, balance, postural control, as well as ability to compensate for deficits. He is has had improvement in functional use LUE  and LLE as well as improvement in safety awareness. He is able to complete ADL tasks at modified independent level. He is modified independent for transfers and is able to ambulate 1000' without AD. He dose require rest breaks due to fatigue and LE ataxia. Speech intelligibility has improved and ST signed off on 12/1.  Family education was completed regarding all aspects of care and safety.    Disposition: 01-Home or Self Care  Diet: Heart Healthy/Carb Modified.   Special Instructions: 1.  Continue dietary restrictions.  2. Coumadin clinic to contact you for blood draw early next week. 3. No strenuous activity or driving till cleared by MD.    Discharge Instructions    Ambulatory referral to Anticoagulation Monitoring   Complete by:  As directed    Needs protime drawn on 12/31 or 08/01/17. Dx- LV thrombus/cardiomyopathy. Please contact patient with appointment   Responsible Group:  [facility] [address]   Date of first INR:  07/30/2017  Ambulatory referral to Physical Medicine Rehab   Complete by:  As directed    1-2 weeks transitional care appt     Allergies as of 07/27/2017   No Known Allergies     Medication List    STOP taking these medications   aspirin 81 MG chewable tablet   clonazePAM 1 MG tablet Commonly known as:  KLONOPIN     TAKE these medications   acetaminophen 500 MG tablet Commonly known as:  TYLENOL Take 1 tablet (500 mg total) by mouth every 6 (six) hours as needed for headache (pain). What changed:  how much to take   atorvastatin 80 MG tablet Commonly known as:  LIPITOR Take 1 tablet (80 mg total) by mouth daily at 6 PM.   carvedilol 3.125 MG tablet Commonly known as:  COREG Take 1 tablet (3.125 mg total) by mouth 2 (two) times daily with a  meal.   digoxin 0.125 MG tablet Commonly known as:  LANOXIN Take 1 tablet (0.125 mg total) by mouth daily.   fenofibrate 160 MG tablet Take 1 tablet (160 mg total) by mouth daily.   folic acid 1 MG tablet Commonly known as:  FOLVITE Take 1 tablet (1 mg total) by mouth daily.   losartan 25 MG tablet Commonly known as:  COZAAR Take 1 tablet (25 mg total) by mouth daily.   multivitamin with minerals Tabs tablet Take 1 tablet by mouth daily.   nicotine 14 mg/24hr patch Commonly known as:  NICODERM CQ - dosed in mg/24 hours Place 1 patch (14 mg total) onto the skin daily.   spironolactone 25 MG tablet Commonly known as:  ALDACTONE Take 0.5 tablets (12.5 mg total) by mouth at bedtime.   thiamine 100 MG tablet Take 1 tablet (100 mg total) by mouth daily.   traZODone 50 MG tablet Commonly known as:  DESYREL Take 100-150 mg by mouth at bedtime as needed for sleep.   warfarin 2 MG tablet Commonly known as:  COUMADIN Take one pill with supper on all days except Saturday. Take one and a half pill on Saturdays.      Follow-up Information    Kirsteins, Victorino SparrowAndrew E, MD Follow up.   Specialty:  Physical Medicine and Rehabilitation Why:  Office will call you with follow up appointment Contact information: [address] [place] [address] [phone]        Anson FretAhern, Antonia B, MD. Call in 1 day(s).   Specialty:  Neurology Why:  for follow up appointment after stroke Contact information: [address] [place] [address] [phone]        [facility]. Go on 08/07/2017.   Specialty:  Cardiology Why:  11:00 AM, [facility], parking code [phone] Contact information: [address] [id number] mc [place] [id number] [phone]       [facility] Follow up.   Why:  Coumadin clinic to contact you with appointment for blood draw next week.  Contact  information: [address] [place] [phone]          Signed: Jacquelynn CreeLove, Felecia Stanfill S 07/30/2017, 11:22 AM

## 2017-07-27 NOTE — Progress Notes (Signed)
Social Work Discharge Note  The overall goal for the admission was met for:   Discharge location: Yes - home with wife and extended family to assist, as well as pt's father's nurse  Length of Stay: Yes - 21 days  Discharge activity level: Yes - mod I with some supervision for higher level tasks  Home/community participation: Yes  Services provided included: MD, RD, PT, OT, SLP, RN, Pharmacy, Neuropsych and SW  Financial Services: Private Insurance: [employer]  Follow-up services arranged: Outpatient: PT/OT at [facility], DME: small base quad cane from [place] and Patient/Family has no preference for HH/DME agencies  Comments (or additional information): Pt to go home where he lives with his wife and his father (for whom he normally cares).  Pt's father's nurse to help him, if needed, especially with transportation to outpt PT/OT.  CSW offered pt resources for substance abuse and pt declined these stating that he good stop using on his own and did not need formal support, that this is not an ongoing problem.  Pt has been told by the team the importance of pt not using substances.  CSW told him to call CSW if any needs arise even at a later time.  Patient/Family verbalized understanding of follow-up arrangements: Yes  Individual responsible for coordination of the follow-up plan: pt and his wife  Confirmed correct DME delivered: Trey Sailors 07/27/2017    [place] [employer], Silvestre Mesi

## 2017-07-27 NOTE — Progress Notes (Signed)
Social Work Patient ID: Angel Davies, male   DOB: 02/21/1971, 46 y.o.   MRN: [id number]   Per team conference discussion, pt is on target to meet and exceed goals by d/c and is still scheduled for d/c on 07-27-17.  Pt is pleased with this and feels prepared for d/c.  Pt is scheduled for [facility] and received small base quad cane from [employer].  CSW remains available to assist as needed.

## 2017-07-27 NOTE — Progress Notes (Signed)
Patient discharged home with wife.  Patient left floor via wheelchair, escorted by nursing staff and spouse.  Patient and family verbalized understanding of discharge instructions as given by Marissa NestlePam Love, PA.  All patient belongings sent with patient, including DME and prescriptions.  Patient appears to be in no immediate distress at this time.  Dani Gobbleeardon, Dariyon Urquilla J, RN

## 2017-08-01 ENCOUNTER — Ambulatory Visit (INDEPENDENT_AMBULATORY_CARE_PROVIDER_SITE_OTHER): Payer: Managed Care, Other (non HMO) | Admitting: *Deleted

## 2017-08-01 DIAGNOSIS — I236 Thrombosis of atrium, auricular appendage, and ventricle as current complications following acute myocardial infarction: Secondary | ICD-10-CM | POA: Diagnosis not present

## 2017-08-01 DIAGNOSIS — Z5181 Encounter for therapeutic drug level monitoring: Secondary | ICD-10-CM

## 2017-08-01 DIAGNOSIS — I6350 Cerebral infarction due to unspecified occlusion or stenosis of unspecified cerebral artery: Secondary | ICD-10-CM

## 2017-08-01 DIAGNOSIS — I255 Ischemic cardiomyopathy: Secondary | ICD-10-CM

## 2017-08-01 DIAGNOSIS — I639 Cerebral infarction, unspecified: Secondary | ICD-10-CM | POA: Diagnosis not present

## 2017-08-01 LAB — POCT INR: INR: 2.6

## 2017-08-01 NOTE — Patient Instructions (Addendum)
A full discussion of the nature of anticoagulants has been carried out.  A benefit risk analysis has been presented to the patient, so that they understand the justification for choosing anticoagulation at this time. The need for frequent and regular monitoring, precise dosage adjustment and compliance is stressed.  Side effects of potential bleeding are discussed.  The patient should avoid any OTC items containing aspirin or ibuprofen, and should avoid great swings in general diet.  Avoid alcohol consumption.  Call if any signs of abnormal bleeding.   Description   Continue same dose of coumadin 2mg   (1 tablet) daily except Coumadin 3mg  (1 and 1/2 tablets) on Saturdays Recheck INR in 1 week

## 2017-08-02 ENCOUNTER — Telehealth (HOSPITAL_COMMUNITY): Payer: Self-pay | Admitting: Physical Medicine and Rehabilitation

## 2017-08-02 NOTE — Telephone Encounter (Signed)
Patient called and reported that he is having increased numbness in left hand since yesterday. Does not recall any injury or new activity that could be contributing to it.  Did have protime checked and INR was "perfect" He has not been set up with primary care but is planning to this week.  I advised him to go to Urgent care for evaluation today.

## 2017-08-06 ENCOUNTER — Ambulatory Visit: Payer: Managed Care, Other (non HMO) | Attending: Physical Medicine & Rehabilitation | Admitting: Physical Therapy

## 2017-08-06 ENCOUNTER — Encounter: Payer: Self-pay | Admitting: Physical Therapy

## 2017-08-06 ENCOUNTER — Ambulatory Visit: Payer: Managed Care, Other (non HMO) | Admitting: Occupational Therapy

## 2017-08-06 ENCOUNTER — Encounter: Payer: Self-pay | Admitting: Occupational Therapy

## 2017-08-06 DIAGNOSIS — I69354 Hemiplegia and hemiparesis following cerebral infarction affecting left non-dominant side: Secondary | ICD-10-CM | POA: Insufficient documentation

## 2017-08-06 DIAGNOSIS — I69118 Other symptoms and signs involving cognitive functions following nontraumatic intracerebral hemorrhage: Secondary | ICD-10-CM

## 2017-08-06 DIAGNOSIS — R278 Other lack of coordination: Secondary | ICD-10-CM | POA: Diagnosis present

## 2017-08-06 DIAGNOSIS — R293 Abnormal posture: Secondary | ICD-10-CM

## 2017-08-06 DIAGNOSIS — R2681 Unsteadiness on feet: Secondary | ICD-10-CM | POA: Insufficient documentation

## 2017-08-06 DIAGNOSIS — I69352 Hemiplegia and hemiparesis following cerebral infarction affecting left dominant side: Secondary | ICD-10-CM | POA: Diagnosis present

## 2017-08-06 DIAGNOSIS — R2689 Other abnormalities of gait and mobility: Secondary | ICD-10-CM | POA: Diagnosis present

## 2017-08-06 DIAGNOSIS — M6281 Muscle weakness (generalized): Secondary | ICD-10-CM

## 2017-08-06 NOTE — Therapy (Signed)
[facility] [address] [place], [place], [address] Phone: [phone]   Fax:  [phone]  Physical Therapy Evaluation  Patient Details  Name: Angel Davies MRN: [id number] Date of Birth: 05/07/1971 Referring Provider: Dr. Claudette LawsAndrew Davies   Encounter Date: 08/06/2017  PT End of Session - 08/07/17 2013    Visit Number  1    Number of Visits  9    Date for PT Re-Evaluation  09/06/17    Authorization Type  [employer] - await info on visit limit    PT Start Time  1535    PT Stop Time  1625    PT Time Calculation (min)  50 min       Past Medical History:  Diagnosis Date  . ADHD (attention deficit hyperactivity disorder)   . Anxiety   . CVA (cerebral vascular accident) (HCC) 07/02/2017   "left sided weakness" (07/02/2017)  . Depression   . History of kidney stones     Past Surgical History:  Procedure Laterality Date  . CYSTOSCOPY WITH URETEROSCOPY, STONE BASKETRY AND STENT PLACEMENT    . RIGHT/LEFT HEART CATH AND CORONARY ANGIOGRAPHY N/A 07/05/2017   Procedure: RIGHT/LEFT HEART CATH AND CORONARY ANGIOGRAPHY;  Surgeon: Dolores PattyBensimhon, Angel R, MD;  Location: [facility] INVASIVE CV LAB;  Service: Cardiovascular;  Laterality: N/A;    There were no vitals filed for this visit.   Subjective Assessment - 08/07/17 1945    Subjective  Pt is a 47 yr old male s/p Rt CVA - presents to PT accompanied by his brother; he is ambulating without device. Pt states he gets tired easily, states he has low endurance and states his balance and walkig get much worse when he fatigues     Patient is accompained by:  -- brother - Angel Davies    Pertinent History  Rt pontine CVA on 07-02-17: alcohol use; cocaine abuse:  anxiety/depression:  myocardiopathy;  acute systolic heart failure; s/p Rt/Lt heart cath & coronary angiography (07-05-17)    Patient Stated Goals  improve balance and gait; increase endurance    Currently in Pain?  Yes    Pain Score  4     Pain  Location  Back    Pain Orientation  Mid;Lower    Pain Descriptors / Indicators  Tightness    Pain Type  Chronic pain    Pain Onset  More than a month ago    Pain Frequency  Intermittent    Aggravating Factors   walking for a long time, sit to stand transfers, supine to sit transfer    Pain Relieving Factors  rest, heat     Multiple Pain Sites  No         [facility] PT Assessment - 08/07/17 0001      Assessment   Medical Diagnosis  Rt pontine infarct    Referring Provider  Dr. Claudette LawsAndrew Davies    Onset Date/Surgical Date  07/02/17    Hand Dominance  Right    Prior Therapy  inpt rehab PT and OT.   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     Leisure  play golf  Sensation   Light Touch  Appears Intact for LLE      ROM / Strength   AROM / PROM / Strength  Strength      AROM   Overall AROM   Within functional limits for tasks performed    Overall AROM Comments  -- bil. LE's      Strength   Left Hip Flexion  5/5    Left Hip Extension  3+/5    Right/Left Knee  Left    Left Knee Flexion  3+/5    Left Knee Extension  5/5    Left Ankle Dorsiflexion  4/5    Left Ankle Plantar Flexion  3+/5      Ambulation/Gait   Ambulation/Gait  Yes    Ambulation/Gait Assistance  6: Modified independent (Device/Increase time)    Ambulation Distance (Feet)  200 Feet    Assistive device  None    Gait Pattern  -- decreased push off LLE and Lt foot slap with fatigue    Ambulation Surface  Level;Indoor    Gait velocity  8.68 secs = 3.78 ft/sec with no device    Stairs  Yes    Stairs Assistance  5: Supervision    Stairs Assistance Details (indicate cue type and reason)  no rail used in ascending; 2 rails used in descending     Stair Management Technique  No rails;Two rails;Alternating pattern;Step to pattern    Number of Stairs  4    Height of Stairs  6      Standardized Balance Assessment   Standardized Balance Assessment  Timed Up and Go Test      Timed Up and Go Test   Normal TUG (seconds)  6.72      High Level Balance   High Level Balance Comments  Pt performed SLS on LLE 2.18 secs; able to stand tandem for 30 secs          LLE SLS 2.18 secs:  RLE SLS >10 secs     Objective measurements completed on examination: See above findings.                PT Education - 08/07/17 2011    Education provided  Yes    Education Details  pt instructed in knee flexion and hip extension and dorsiflexion (LLE) with use of green theraband; heel raises in standing    Person(s) Educated  Patient    Methods  Explanation;Demonstration;Handout    Comprehension  Verbalized understanding;Returned demonstration          PT Long Term Goals - 08/07/17 2030      PT LONG TERM GOAL #1   Title  Increase gait velocity from 3.78 ft/sec to >/= 4.1 ft/sec for incr. gait efficiency.    Baseline  8.68 secs = 3.78 ft/sec on 08-06-17    Time  4    Period  Weeks    Status  New    Target Date  09/06/17      PT LONG TERM GOAL #2   Title  Pt will amb. for 10" nonstop with minimal c/o fatigue, i.e. </= 2/10 intensity of fatigue.    Time  4    Period  Weeks    Status  New    Target Date  09/06/17      PT LONG TERM GOAL #3   Title  Pt will increase FGA score by at least 5 points to demo improved balance with gait.    Baseline  TBA  Time  4    Period  Weeks    Status  New    Target Date  09/06/17      PT LONG TERM GOAL #4   Title  Independent in HEP for LLE strengthening and balance.    Time  4    Period  Weeks    Status  New    Target Date  09/06/17      PT LONG TERM GOAL #5   Title  Negotiate 4 steps without rail using a step over step sequence with supervision to demo improved balance.    Time  4     Period  Weeks    Status  New    Target Date  09/06/17             Plan - 08/07/17 2014    Clinical Impression Statement  Pt is a 47 yr old male with Lt hemiparesis due to Rt pontine infarct on 07-02-17.  Pt was hospitalized from 12-3- 07-06-17 with inpatient rehab admission during this time as well.  Pt presents with LLE weakness resulting in gait deviations, decr. high level balance skills, and decr. endurance/activity tolerance and decr. muscle endurance in LLE.                                                                                                                                                          History and Personal Factors relevant to plan of care:  h/o alcohol use, cocaine abuse, marijuana use:   anxiety/depression    Clinical Presentation  Evolving    Clinical Presentation due to:  Rt pontine CVA with Lt hemiparesis    Clinical Decision Making  Moderate    Rehab Potential  Good    PT Frequency  2x / week    PT Duration  4 weeks    PT Treatment/Interventions  ADLs/Self Care Home Management;Gait training;Stair training;Therapeutic activities;Therapeutic exercise;Patient/family education;Neuromuscular re-education;Balance training    PT Next Visit Plan  check HEP given on 08-06-17 during eval (green theraband given also):  continue LLE strengthening, high level balance; do FGA     PT Home Exercise Plan  LLE strengthening - hip ext., hamstrings, PF and DF LLE    Recommended Other Services  pt is receiving OT - OT is requesting referral for ST    Consulted and Agree with Plan of Care  Patient;Family member/caregiver    Family Member Consulted  brother Angel Fickle       Patient will benefit from skilled therapeutic intervention in order to improve the following deficits and impairments:  Abnormal gait, Cardiopulmonary status limiting activity, Decreased activity tolerance, Decreased balance, Decreased cognition, Decreased endurance, Decreased coordination, Decreased strength,  Impaired sensation, Impaired UE functional use, Other (comment)(decr. short-term memory)  Visit Diagnosis: Hemiplegia and hemiparesis following cerebral infarction affecting  left dominant side (HCC) - Plan: PT plan of care cert/re-cert  Muscle weakness (generalized) - Plan: PT plan of care cert/re-cert  Other abnormalities of gait and mobility - Plan: PT plan of care cert/re-cert     Problem List Patient Active Problem List   Diagnosis Date Noted  . Chronic systolic heart failure (HCC) 08/07/2017  . Encounter for therapeutic drug monitoring 07/27/2017  . Muscle pain   . Supratherapeutic INR   . Leukocytosis   . Coronary artery disease involving native coronary artery of native heart without angina pectoris   . Prediabetes   . Hemorrhoids   . 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 Left hemiparesis (HCC)   . Cardiomyopathy, ischemic   . 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 Myocardiopathy (HCC)   . Mixed hyperlipidemia   . CVA (cerebral vascular accident) (HCC) 07/02/2017  . Alcohol use 07/02/2017  . Anxiety 07/02/2017  . Depression 07/02/2017  . Acute hyperglycemia 07/02/2017  . Elevated blood-pressure reading without diagnosis of hypertension 07/02/2017  . ADHD 07/02/2017  . Cocaine abuse (HCC) 07/02/2017  . Marijuana abuse 06/20/2017  . Cocaine use 06/20/2017    DildayDonavan Burnet, PT 08/07/2017, 8:43 PM  [facility] [address] [place], [place], [address] Phone: [phone]   Fax:  [phone]  Name: Angel Davies MRN: [id number] Date of Birth: 05-19-1971

## 2017-08-06 NOTE — Therapy (Signed)
[facility] [address] [place], [place], [address] Phone: [phone]   Fax:  [phone]  Occupational Therapy Evaluation  Patient Details  Name: Angel Davies MRN: [id number] Date of Birth: 05/03/1971 No Data Recorded  Encounter Date: 08/06/2017  OT End of Session - 08/06/17 1739    Visit Number  1    Number of Visits  16    Date for OT Re-Evaluation  10/01/17    Authorization Type  [employer] Managed - no auth await info on any visit limitations    OT Start Time  1448    OT Stop Time  1532    OT Time Calculation (min)  44 min    Activity Tolerance  Patient tolerated treatment well       Past Medical History:  Diagnosis Date  . ADHD (attention deficit hyperactivity disorder)   . Anxiety   . CVA (cerebral vascular accident) (HCC) 07/02/2017   "left sided weakness" (07/02/2017)  . Depression   . History of kidney stones     Past Surgical History:  Procedure Laterality Date  . CYSTOSCOPY WITH URETEROSCOPY, STONE BASKETRY AND STENT PLACEMENT    . RIGHT/LEFT HEART CATH AND CORONARY ANGIOGRAPHY N/A 07/05/2017   Procedure: RIGHT/LEFT HEART CATH AND CORONARY ANGIOGRAPHY;  Surgeon: Dolores PattyBensimhon, Daniel R, MD;  Location: [facility] IN[facility] LAB;  Service: Cardiovascular;  Laterality: N/A;    There were no vitals filed for this visit.  Subjective Assessment - 08/06/17 1453    Patient is accompained by:  Family member brother    Currently in Pain?  Yes    Pain Score  4     Pain Location  Back    Pain Orientation  Lower;Mid    Pain Descriptors / Indicators  Tightness    Pain Type  Chronic pain    Pain Frequency  Intermittent    Aggravating Factors   walking for a long time, going from sitting to standing, supine to sit    Pain Relieving Factors  rest, heat.          [facility] - 08/06/17 0001      Assessment   Medical Diagnosis  R pontine infarct, subacute R temporal occipital PCA territory    Referring  Provider  Dr. Claudette LawsAndrew Kirsteins    Onset Date/Surgical Date  07/02/17    Hand Dominance  Right    Prior Therapy  inpt rehab PT and OT.        Precautions   Precautions  Fall pt has PT eval today      Restrictions   Weight Bearing Restrictions  No      Balance Screen   Has the patient fallen in the past 6 months  No      Home  Environment   Family/patient expects to be discharged to:  Private residence    Living Arrangements  Spouse/significant other father who had stroke/pt and wife were taking care of him    Available Help at Discharge  Available 24 hours/day    Home Layout  Two level    Bathroom Shower/Tub  Associate ProfessorTub/Shower unit    Bathroom Toilet  Standard    Additional Comments  Pt is not using any equipment at home      Prior Function   Level of Independence  Independent    Vocation  Full time employment    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     Leisure  play  golf      ADL   Eating/Feeding  Independent    Grooming  Modified independent uses electric razor since he is on Coumadin    Upper Body Bathing  Minimal assistance for back    Lower Body Bathing  Modified independent    Upper Body Dressing  Increased time buttons, tying    Lower Body Dressing  Increased time difficult to reach down and pick up L foot to dress it    Toilet Transfer  Independent    Toileting - Clothing Manipulation  Independent    Toileting -  Hygiene  Independent    Tub/Shower Transfer  Modified independent sits on edge of tub and then swings legs in and then stands      IADL   Shopping  Assistance for transportation;Needs to be accompanied on any shopping trip    Light Housekeeping  Performs light daily tasks such as dishwashing, bed making    Meal Prep  -- did not cook before/can fix drink or simple snack    Community Mobility  Relies on family or friends for transportation    Medication Management  Takes responsibility if medication is prepared in advance in  seperate dosage    Financial Management  -- has not resumed yet      Mobility   Mobility Status  Needs assist    Mobility Status Comments  supervision in the community - pt feels his balance is "ok it my endurance"      Written Expression   Dominant Hand  Right    Handwriting  100% legible      Vision - History   Baseline Vision  No visual deficits    Additional Comments  "My vision took a hit" - things look like they run together close up.        Vision Assessment   Eye Alignment  Impaired (comment) mild malalignment    Ocular Range of Motion  Within Functional Limits    Tracking/Visual Pursuits  Able to track stimulus in all quads without difficulty    Convergence  Within functional limits    Comment  denies diplopia and states that today close reading has improved      Activity Tolerance   Activity Tolerance  Tolerates 30 min activity with multiple rests pt feels his endurance is biggest deficit      Cognition   Overall Cognitive Status  Impaired/Different from baseline    Mini Mental State Exam   Pt states he is having short term memory issues.  "I have to write it down or I forget"        Sensation   Light Touch  Impaired by gross assessment dulled on volar side of fingers only L hand    Hot/Cold  Appears Intact    Proprioception  Appears Intact      Coordination   Gross Motor Movements are Fluid and Coordinated  Yes    Fine Motor Movements are Fluid and Coordinated  No    9 Hole Peg Test  Right;Left    Right 9 Hole Peg Test  23.89    Left 9 Hole Peg Test  53.38      Edema   Edema  very mild in L hand      Tone   Assessment Location  Left Upper Extremity      ROM / Strength   AROM / PROM / Strength  AROM;Strength      AROM   Overall AROM  Within functional limits for tasks performed    Overall AROM Comments  BUE's; L scapula is abduction, depressed and downwardly rotated.       Strength   Overall Strength  Deficits    Overall Strength Comments  LUE:  4/5  proximally, impaired grip strength see below.        Hand Function   Right Hand Gross Grasp  Functional    Right Hand Grip (lbs)  105    Left Hand Gross Grasp  Impaired    Left Hand Grip (lbs)  45      LUE Tone   LUE Tone  Within Functional Limits                        OT Short Term Goals - 08/06/17 1723      OT SHORT TERM GOAL #1   Title  Pt will be mod I with HEP for LUE proximal strengthening, grip strength and coordination - 09/03/2017    Status  New      OT SHORT TERM GOAL #2   Title  Pt will demonstrate improved grip strength by at least 8 pounds to assist with opening containers - baseline = 45    Status  New      OT SHORT TERM GOAL #3   Title  Pt will demonstrate improved coordination as evidenced by decreasing time on 9 hole peg by at least 10 seconds to assist with tying shoes, other fine motor tasks.    Baseline  53.38    Status  New      OT SHORT TERM GOAL #4   Title  Pt will be able to pick up 5 pound object and place in overhead cabinet x5 without dropping and with postural alignment     Status  New      OT SHORT TERM GOAL #5   Title  Pt will tolerate at least 20 minutes of ambulatory or standing activity without rest break    Status  New        OT Long Term Goals - 08/06/17 1727      OT LONG TERM GOAL #1   Title  Pt will be mod I with upgraded HEP prn - 10/01/3016    Status  New      OT LONG TERM GOAL #2   Title  Pt will demonstrate improve grip strength by at least 12 pounds to assist with using LUE as non dominant in house mgmt tasks (baseline = 45)    Status  New      OT LONG TERM GOAL #3   Title  Pt will demonstrate improved coordination as evidenced by decreasing time on 9 hole peg by at least 15 seconds to assist with fine motor tasks (baseline= 53.38)    Status  New      OT LONG TERM GOAL #4   Title  Pt will tolerate at least 35 minutes of ambulatory or standing activity without rest breaks.     Status  New      OT LONG TERM  GOAL #5   Title  Pt will demonstrate ability to lift 8 pound object into overhead cabinet x4 without dropping and with good postural alignment    Status  New      OT LONG TERM GOAL #6   Title  Pt will be mod I with putting and driving of golf ball on outdoor, uneven surfaces    Status  New            Plan - 08/06/17 1732    Clinical Impression Statement  Pt is a 47 year old male s/p R pontine infarct, subacute R temporoccipital PCA territory infarct on 07/02/2017.  Pt was discharged home on 07/27/2017 after inpt rehab stay.  Pt presents today with the following deficits that impact his independence:  L hemiplegia, muscle weakness, incoordination in L hand, decreased grip strength, abnormal postural alignment, decreased balance, impaired sensation LUE, decreased activity tolerance, ST memory deficits. Pt will benefit from skilled OT to address these deficits and maximize independence.     Occupational Profile and client history currently impacting functional performance  PMH:  ADHD, anxiety, depression, fall on 12/2016 with loss of peripheral vision and onset of HA, poly substance abuse, severe stenosis posterior terrritory, severe CAD,     Occupational performance deficits (Please refer to evaluation for details):  ADL's;IADL's;Work;Leisure;Social Participation    Rehab Potential  Good    OT Frequency  2x / week    OT Duration  8 weeks    OT Treatment/Interventions  Self-care/ADL training;Aquatic Therapy;Moist Heat;Therapeutic exercise;Neuromuscular education;Functional Mobility Training;DME and/or AE instruction;Manual Therapy;Cognitive remediation/compensation;Therapeutic activities;Patient/family education;Balance training    Plan  review goals, initiate HEP for grip and coordination, NMR for LUE proximal control (scapular aligment and stability)/trunk, balance, activity tolerance, resistive overhead reach    Recommended Other Services  ST eval and tx    Consulted and Agree with Plan of  Care  Patient;Family member/caregiver    Family Member Consulted  brother       Patient will benefit from skilled therapeutic intervention in order to improve the following deficits and impairments:  Abnormal gait, Decreased activity tolerance, Decreased balance, Decreased coordination, Decreased cognition, Decreased mobility, Difficulty walking, Decreased strength, Impaired UE functional use, Impaired sensation  Visit Diagnosis: Hemiplegia and hemiparesis following cerebral infarction affecting left non-dominant side (HCC) - Plan: Ot plan of care cert/re-cert  Muscle weakness (generalized) - Plan: Ot plan of care cert/re-cert  Other lack of coordination - Plan: Ot plan of care cert/re-cert  Abnormal posture - Plan: Ot plan of care cert/re-cert  Unsteadiness on feet - Plan: Ot plan of care cert/re-cert  Other symptoms and signs involving cognitive functions following nontraumatic intracerebral hemorrhage - Plan: Ot plan of care cert/re-cert    Problem List Patient Active Problem List   Diagnosis Date Noted  . Encounter for therapeutic drug monitoring 07/27/2017  . Muscle pain   . Supratherapeutic INR   . Leukocytosis   . Coronary artery disease involving native coronary artery of native heart without angina pectoris   . Prediabetes   . Hemorrhoids   . 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 Left hemiparesis (HCC)   . Cardiomyopathy, ischemic   . 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 Acute systolic heart failure (HCC)   . Myocardiopathy (HCC)   . Mixed hyperlipidemia   . CVA (cerebral vascular accident) (HCC) 07/02/2017  . Alcohol use 07/02/2017  . Anxiety 07/02/2017  . Depression 07/02/2017  . Acute hyperglycemia 07/02/2017  . Elevated blood-pressure reading without diagnosis of hypertension 07/02/2017  . ADHD 07/02/2017  . Cocaine abuse (HCC) 07/02/2017  . Marijuana abuse 06/20/2017  . Cocaine use 06/20/2017    Norton Pastel,  OTR/L 08/06/2017, 5:45 PM  [facility] [address] [place], [place], [address] Phone: [phone]   Fax:  [phone]  Name: Angel Davies MRN: [id number]  Date of Birth: [birth year]

## 2017-08-06 NOTE — Patient Instructions (Addendum)
+  trengthening: Hip Extension - Resisted    With tubing around right ankle, face anchor and pull leg straight back. Repeat __10__ times per set. Do __3__ sets per session. Do __1__ sessions per day.  http://orth.exer.us/636  Dorsiflexion: Resisted    Facing anchor, tubing around left foot, pull toward face.  Repeat _10___ times per set. Do _3__ sets per session. Do _1  sessions per day.  Place  Band on top of LEFT foot - hold band down with Rt foot - lift Lt foot - hold 3 secs up - 10-15 reps http://orth.exer.us/8   Copyright  VHI. All rights reserved.   http://orth.exer.us/670   Copyright  VHI. All rights reserved.  Heel Raise: Unilateral (Standing)    Balance on left foot, then rise on ball of foot. Repeat __10__ times per set. Do _3KNEE: Flexion - Prone    Bend knee. Raise heel toward buttocks. Do not raise hips. _10__ reps per set, _3__ sets per day, __5_ days per week  SLOWLY LOWER LEFT LEG _ DO NOT LET IT FALL    Copyright  VHI. All rights reserved.  _ sets per session. Do __1__ sessions per day.  http://orth.exer.us/40   Copyright  VHI. All rights reserved.  Knee Flexion: Resisted (Sitting)    Sit with band under left foot and looped around ankle of supported leg. Pull unsupported leg back. Repeat __10__ times per set. Do _3___ sets per session. Do _1___ sessions per day.  GREEN BAND AROUND ANKLE  http://orth.exer.us/694   Copyright  VHI. All rights reserved.

## 2017-08-07 ENCOUNTER — Ambulatory Visit (HOSPITAL_COMMUNITY)
Admission: RE | Admit: 2017-08-07 | Discharge: 2017-08-07 | Disposition: A | Discharge status: Home / Self Care | Payer: Managed Care, Other (non HMO) | Source: Ambulatory Visit | Attending: Cardiology | Admitting: Cardiology

## 2017-08-07 ENCOUNTER — Encounter: Payer: Self-pay | Admitting: Physical Therapy

## 2017-08-07 VITALS — BP 130/76 | HR 87 | Wt 200.1 lb

## 2017-08-07 DIAGNOSIS — I255 Ischemic cardiomyopathy: Secondary | ICD-10-CM

## 2017-08-07 DIAGNOSIS — I639 Cerebral infarction, unspecified: Secondary | ICD-10-CM

## 2017-08-07 DIAGNOSIS — Z7901 Long term (current) use of anticoagulants: Secondary | ICD-10-CM | POA: Diagnosis not present

## 2017-08-07 DIAGNOSIS — Z720 Tobacco use: Secondary | ICD-10-CM

## 2017-08-07 DIAGNOSIS — F1010 Alcohol abuse, uncomplicated: Secondary | ICD-10-CM | POA: Insufficient documentation

## 2017-08-07 DIAGNOSIS — I110 Hypertensive heart disease with heart failure: Secondary | ICD-10-CM | POA: Insufficient documentation

## 2017-08-07 DIAGNOSIS — I6350 Cerebral infarction due to unspecified occlusion or stenosis of unspecified cerebral artery: Secondary | ICD-10-CM | POA: Diagnosis not present

## 2017-08-07 DIAGNOSIS — I5022 Chronic systolic (congestive) heart failure: Secondary | ICD-10-CM | POA: Diagnosis present

## 2017-08-07 DIAGNOSIS — E785 Hyperlipidemia, unspecified: Secondary | ICD-10-CM | POA: Diagnosis not present

## 2017-08-07 DIAGNOSIS — F17210 Nicotine dependence, cigarettes, uncomplicated: Secondary | ICD-10-CM | POA: Insufficient documentation

## 2017-08-07 DIAGNOSIS — Z79899 Other long term (current) drug therapy: Secondary | ICD-10-CM | POA: Insufficient documentation

## 2017-08-07 DIAGNOSIS — I2510 Atherosclerotic heart disease of native coronary artery without angina pectoris: Secondary | ICD-10-CM | POA: Diagnosis not present

## 2017-08-07 DIAGNOSIS — F1410 Cocaine abuse, uncomplicated: Secondary | ICD-10-CM | POA: Diagnosis not present

## 2017-08-07 DIAGNOSIS — F419 Anxiety disorder, unspecified: Secondary | ICD-10-CM | POA: Diagnosis not present

## 2017-08-07 DIAGNOSIS — F1910 Other psychoactive substance abuse, uncomplicated: Secondary | ICD-10-CM

## 2017-08-07 DIAGNOSIS — F909 Attention-deficit hyperactivity disorder, unspecified type: Secondary | ICD-10-CM | POA: Insufficient documentation

## 2017-08-07 DIAGNOSIS — F329 Major depressive disorder, single episode, unspecified: Secondary | ICD-10-CM | POA: Diagnosis not present

## 2017-08-07 DIAGNOSIS — Z8673 Personal history of transient ischemic attack (TIA), and cerebral infarction without residual deficits: Secondary | ICD-10-CM | POA: Diagnosis not present

## 2017-08-07 LAB — BASIC METABOLIC PANEL
ANION GAP: 8 (ref 5–15)
BUN: 15 mg/dL (ref 6–20)
CO2: 25 mmol/L (ref 22–32)
Calcium: 9.8 mg/dL (ref 8.9–10.3)
Chloride: 106 mmol/L (ref 101–111)
Creatinine, Ser: 0.86 mg/dL (ref 0.61–1.24)
GLUCOSE: 94 mg/dL (ref 65–99)
POTASSIUM: 4.1 mmol/L (ref 3.5–5.1)
Sodium: 139 mmol/L (ref 135–145)

## 2017-08-07 LAB — DIGOXIN LEVEL: Digoxin Level: 0.5 ng/mL — ABNORMAL LOW (ref 0.8–2.0)

## 2017-08-07 MED ORDER — SACUBITRIL-VALSARTAN 24-26 MG PO TABS: 1.0000 | ORAL_TABLET | Freq: Two times a day (BID) | ORAL | 6 refills | Status: DC

## 2017-08-07 NOTE — Progress Notes (Signed)
Advanced Heart Failure Clinic Note   Referring Physician: Primary Care: Primary Cardiologist:  HPI:  Angel Davies is a 47 y.o. male with PMH of HTN, ADHD, Obesity, anxiety, substance abuse, and depression.   Pt presented 07/02/2017 with CVA. UDS + for cocaine. Neurology followed. Found to have new systolic CHF by echo.   Echo 07/02/17 with LVEF 20-25%, grade 2 DD, Trivial MR, Trivial TR -> HF team consulted.  With new CHF, pt underwent R/LHC as below that showed severe 3vD with no options for CABG/CAD. HF meds titrated as tolerated.  Admitted to CIR from 12/7 - 07/27/17 with marked improvement. Med titration somewhat limited due to soft pressures.   Pt presents today for post hospital follow up. Doing great. Continues to work with PT/OT and now Speech Therapy. He has nearly FROM back in his left arm, can lift over his head and grasp effectively. Still has decreased sensation.  He is working on walking. Can get about a block before his legs tire out. He denies drug abuse since discharge. He did have 1 glass of wine for new years. He denies dizziness or lightheadedness. He does not have any SOB with anything he is currently able to do. Taking all medications as directed.   Review of systems complete and found to be negative unless listed in HPI.    cMRI on 07/06/17 with 1) Severe LVE with EF 18% septal and apical akinesis diffuse hypokinesis 2) Apical thrombus measuring 11 mm x 8 mm 3) Full thickness scar involving the mid and distal septum and apex Subendocardial scar involving the basal inferior lateral wall  R/LHC 07/05/17  Mid RCA to Dist RCA lesion is 100% stenosed.  Dist Cx lesion is 100% stenosed.  Mid LAD lesion is 100% stenosed.  Prox Cx to Dist Cx lesion is 30% stenosed.  Findings:  Ao = 104/68 (84) LV = 110/9 RA = 1 RV = 19/3 PA = 19/9 (11) PCW = 5 Fick cardiac output/index = 4.3/2.1 PVR = 1.5 WU SVR = 2122 Ao sat = 96% PA sat = 64%, 66%  Assessment: 1.  Severe CAD with left-dominant system and occluded mLAD 2. Ischemic CM with EF 25% by echo 3. Well compensated filling pressures with moderately reduced cardiac output  Past Medical History:  Diagnosis Date  . ADHD (attention deficit hyperactivity disorder)   . Anxiety   . CVA (cerebral vascular accident) (HCC) 07/02/2017   "left sided weakness" (07/02/2017)  . Depression   . History of kidney stones    Current Outpatient Medications  Medication Sig Dispense Refill  . acetaminophen (TYLENOL) 500 MG tablet Take 1 tablet (500 mg total) by mouth every 6 (six) hours as needed for headache (pain). 30 tablet 0  . atorvastatin (LIPITOR) 80 MG tablet Take 1 tablet (80 mg total) by mouth daily at 6 PM. 30 tablet 0  . carvedilol (COREG) 3.125 MG tablet Take 1 tablet (3.125 mg total) by mouth 2 (two) times daily with a meal. 60 tablet 0  . digoxin (LANOXIN) 0.125 MG tablet Take 1 tablet (0.125 mg total) by mouth daily. 30 tablet 0  . fenofibrate 160 MG tablet Take 1 tablet (160 mg total) by mouth daily. 30 tablet 0  . folic acid (FOLVITE) 1 MG tablet Take 1 tablet (1 mg total) by mouth daily. 30 tablet 0  . losartan (COZAAR) 25 MG tablet Take 1 tablet (25 mg total) by mouth daily. 30 tablet 0  . Multiple Vitamin (MULTIVITAMIN WITH MINERALS) TABS  tablet Take 1 tablet by mouth daily. 100 tablet 0  . nicotine (NICODERM CQ - DOSED IN MG/24 HOURS) 14 mg/24hr patch Place 1 patch (14 mg total) onto the skin daily. 28 patch 0  . spironolactone (ALDACTONE) 25 MG tablet Take 0.5 tablets (12.5 mg total) by mouth at bedtime. 30 tablet 0  . thiamine 100 MG tablet Take 1 tablet (100 mg total) by mouth daily. 30 tablet 0  . traZODone (DESYREL) 50 MG tablet Take 100-150 mg by mouth at bedtime as needed for sleep.    Marland Kitchen. warfarin (COUMADIN) 2 MG tablet Take one pill with supper on all days except Saturday. Take one and a half pill on Saturdays. 35 tablet 0   No current facility-administered medications for this  encounter.    No Known Allergies  Social History   Socioeconomic History  . Marital status: Married    Spouse name: Not on file  . Number of children: Not on file  . Years of education: Not on file  . Highest education level: Not on file  Social Needs  . Financial resource strain: Not on file  . Food insecurity - worry: Not on file  . Food insecurity - inability: Not on file  . Transportation needs - medical: Not on file  . Transportation needs - non-medical: Not on file  Occupational History  . Not on file  Tobacco Use  . Smoking status: Current Every Day Smoker    Packs/day: 1.00    Years: 30.00    Pack years: 30.00    Types: Cigarettes  . Smokeless tobacco: Never Used  Substance and Sexual Activity  . Alcohol use: Yes    Alcohol/week: 2.4 oz    Types: 4 Glasses of wine per week  . Drug use: Yes    Types: Marijuana, "Crack" cocaine    Comment: 07/02/2017 "smokes marijuana a couple days/week"  . Sexual activity: Yes  Other Topics Concern  . Not on file  Social History Narrative  . Not on file   Family History  Problem Relation Age of Onset  . Stroke Mother   . Colon cancer Mother   . Prostate cancer Father   . Seizures Neg Hx    Vitals:   08/07/17 1157  BP: 130/76  Pulse: 87  SpO2: 97%  Weight: 200 lb 2 oz (90.8 kg)   Wt Readings from Last 3 Encounters:  08/07/17 200 lb 2 oz (90.8 kg)  07/27/17 205 lb 4 oz (93.1 kg)  07/06/17 194 lb 0.1 oz (88 kg)   PHYSICAL EXAM: General:  Well appearing. No respiratory difficulty HEENT: normal Neck: supple. no JVD. Carotids 2+ bilat; no bruits. No lymphadenopathy or thyromegaly appreciated. Cor: PMI nondisplaced. Regular rate & rhythm. No rubs, gallops or murmurs. Lungs: clear Abdomen: soft, nontender, nondistended. No hepatosplenomegaly. No bruits or masses. Good bowel sounds. Extremities: no cyanosis, clubbing, rash, edema Neuro: alert & oriented x 3, cranial nerves grossly intact. moves all 4 extremities w/o  difficulty. Affect pleasant.   ASSESSMENT & PLAN:  1. Chronic systolic CHF - Echo 07/02/17 with LVEF 20-25%, grade 2 DD, Trivial MR, Trivial TR - cMRI EF 18% - NYHA II symptoms with limited mobility - Volume status stable on exam.  - Continue spiro 12.5 mg daily. BMET today.  - STOP losartan. Start Entresto 24/26 mg BID. BMET today and 1 week.  - Continue digoxin 0.125 mg daily. Check level today.  - Continue coreg 3.125 mg BID.  - Can eventually transition to  Entresto as BP tolerates.  - Will need repeat echo 3-4 months with med optimization to consider for ICD.   3. CAD - LHC 07/05/17 with severe CAD with left-dominant system and occluded mLAD. - Mostly distal CAD. No good targets for CABG or PCI. Continue medical therapy. Based on scar pattern on MRI, We are hopeful EF will improve at least moderately   - Continue ASA and statin. - No s/s of ischemia.     3. CVA - MRI Head 07/02/17 R pontine infarct and sub acute R temporal occipital/PCA territory infarct, along with old left occipital lobe/PCA territory infarct. - Remains on coumadin. INR per coumadin clinic. - Continues to recover.   4. Cocaine abuse - Abstinent since recent hospitalization. Stressed importance of abstinence moving forward.  5. Alcohol abuse - Abstinent since recent hospitalization. Stressed importance of abstinence moving forward.   6. HLD - Continue atorvastatin 80 mg daily.  - Recheck Lipids 3 months.   Doing well overall. Meds and labs as above. Follow up 3 weeks with Pharm-D for continued med-titration. Follow up 6 weeks with Dr. Gala Romney.   Graciella Freer, PA-C 08/07/17   Greater than 50% of the 30 minute visit was spent in counseling/coordination of care regarding disease state education, salt/fluid restriction, sliding scale diuretics, and medication compliance.

## 2017-08-07 NOTE — Patient Instructions (Addendum)
Routine lab work today. Will notify you of abnormal results, otherwise no news is good news!  STOP Losartan.  START Entresto 24/26 mg tablet twice daily on Thursday.  Return in 1 week to repeat labs.  Follow up with Elizabeth PalauErika Nicolsen CHF clinical pharmacist to [facility]rate [facility] in 3 weeks.  Follow up with Dr. Gala RomneyBensimhon in 6 weeks for full visit.  Take all medication as prescribed the day of your appointment. Bring all medications with you to your appointment.  Do the following things EVERYDAY: 1) Weigh yourself in the morning before breakfast. Write it down and keep it in a log. 2) Take your medicines as prescribed 3) Eat low salt foods-Limit salt (sodium) to 2000 mg per day.  4) Stay as active as you can everyday 5) Limit all fluids for the day to less than 2 liters

## 2017-08-08 ENCOUNTER — Other Ambulatory Visit (HOSPITAL_COMMUNITY): Payer: Self-pay

## 2017-08-08 ENCOUNTER — Ambulatory Visit (INDEPENDENT_AMBULATORY_CARE_PROVIDER_SITE_OTHER): Payer: Managed Care, Other (non HMO)

## 2017-08-08 DIAGNOSIS — I6350 Cerebral infarction due to unspecified occlusion or stenosis of unspecified cerebral artery: Secondary | ICD-10-CM

## 2017-08-08 DIAGNOSIS — I236 Thrombosis of atrium, auricular appendage, and ventricle as current complications following acute myocardial infarction: Secondary | ICD-10-CM

## 2017-08-08 DIAGNOSIS — I255 Ischemic cardiomyopathy: Secondary | ICD-10-CM

## 2017-08-08 DIAGNOSIS — Z5181 Encounter for therapeutic drug level monitoring: Secondary | ICD-10-CM

## 2017-08-08 LAB — POCT INR: INR: 2.6

## 2017-08-08 NOTE — Patient Instructions (Signed)
Description   Continue same dose of coumadin 2mg (1 tablet) daily except Coumadin 3mg (1.5 tablets) on Saturdays.   Recheck INR in 1 week.      

## 2017-08-10 ENCOUNTER — Other Ambulatory Visit: Payer: Self-pay

## 2017-08-10 NOTE — Progress Notes (Signed)
This encounter was created in error - please disregard.

## 2017-08-10 NOTE — Patient Outreach (Addendum)
[facility]) Care Management  08/10/2017  Angel Davies 11/03/1970 [id number]   EMMI: stroke Referral date: 08/10/17 Referral source: EMMI stroke red alert Referral reason: went to follow up appointment: NO Day # 13  Telephone call to patient regarding EMMI stroke red alert. HIPAA verified with patient. Discussed EMMI stroke program with patient. Patient states he followed up with his new primary MD, Dr. Susa SimmondsVia on 08/07/17.  Patient states he has many appointments this month and is having some trouble arranging transportation for all of them.  Patient states he is having outpatient therapy, has follow up at the coumadin clinic, and has doctor appointment follow up to attend.  Patient denies having any new onset of symptoms. Patient states since having the stroke he experiences numbness in his hands and left sided arm and leg weakness. Patient reports having some short term memory loss.  RNCM [facility] to discuss transportation assistance options. Patient verbally agreed to services.  RNCM discussed with patient importance of keeping follow up appointments with his doctors.  RNCM advised patient to take his medications as prescribed. Advised to report any concerns or side effects of medications to  His doctor.  RNCM discussed signs/ symptoms of stroke. Advised to call 911 for stroke like symptoms.  Patient reports, "I am also having trouble with my heart and I am see Dr. Clarise CruzBenshimon for this. Patient states he was not given any directive to monitor weight or given any treatment plan as of yet . RNCM advised patient to adhere to treatment plan when recommended by Dr. Clarise CruzBenshimon.  RNCM confirmed patient knows how to contact his doctor after hours if needed.  RNCM offered to send patient EMMI education material to patient regarding stroke. Patient verbally agreed. RNCM advised patient to notify MD of any changes in condition prior to  scheduled appointment. RNCM provided RNCM contact name and number: [phone].  RNCM verified patient aware of 911 services for urgent/ emergent needs. Patient verbally agreed to follow up call with RNCM.   ASSESSMENT: Per patients discharge summary: Admit date: 07/06/2017 Discharge date: 07/27/2017  Discharge Diagnoses:  Principal Problem:   Right pontine stroke [facility](HCC)  47 y.o. male with history of ADHD, anxiety, depression/anxiety, fall 12/2016 with loss of peripheral vision and onset of HA, polysubtance abuse who was admitted on 07/02/17 with fall, slurred speech and left sided weakness.  MRI brain revealed "Acute RIGHT pontine infarct, Subacute small RIGHT temporal occipital lobe/PCA territory infarct and severe stenosis distal rigth P2 segment.  2D echo showed EF 20-25% with moderately dilated LV, akinesis of anterior and apical myocardium with grade 2 diastolic dysfunction.   underwent cardiac cath on 12/6 revealing severe CAD with occluded mid LAD and well compensated filling pressures with moderately reduced cardiac output. Cardiac MRI revealed severe left ventricular dysfunction with ejection fraction of 18%, apical thrombus, diffuse hypokinesis and a full-thickness scar involving the left mid, distal septum and apex and a subendocardial scar involving the basal inferior lateral wall was seen   PLAN: RNCm will refer patient to social worker for transportation assistance.  RNCM will; follow up with patient within 1 week.  RNCM will send patIent EMMI education material on stroke  RNCM will send patient Advance directive as discussed.   George InaDavina Shada Nienaber RN,BSN,CCM [facility] Telephonic  [phone]

## 2017-08-13 ENCOUNTER — Encounter: Payer: Self-pay | Admitting: *Deleted

## 2017-08-13 ENCOUNTER — Encounter: Payer: Self-pay | Admitting: Physical Medicine & Rehabilitation

## 2017-08-13 ENCOUNTER — Ambulatory Visit (HOSPITAL_BASED_OUTPATIENT_CLINIC_OR_DEPARTMENT_OTHER): Payer: Managed Care, Other (non HMO) | Admitting: Physical Medicine & Rehabilitation

## 2017-08-13 ENCOUNTER — Encounter: Payer: Managed Care, Other (non HMO) | Attending: Physical Medicine & Rehabilitation

## 2017-08-13 ENCOUNTER — Other Ambulatory Visit: Payer: Self-pay | Admitting: *Deleted

## 2017-08-13 VITALS — BP 95/66 | HR 102

## 2017-08-13 DIAGNOSIS — H547 Unspecified visual loss: Secondary | ICD-10-CM | POA: Diagnosis not present

## 2017-08-13 DIAGNOSIS — I2510 Atherosclerotic heart disease of native coronary artery without angina pectoris: Secondary | ICD-10-CM | POA: Diagnosis not present

## 2017-08-13 DIAGNOSIS — F419 Anxiety disorder, unspecified: Secondary | ICD-10-CM | POA: Insufficient documentation

## 2017-08-13 DIAGNOSIS — F909 Attention-deficit hyperactivity disorder, unspecified type: Secondary | ICD-10-CM | POA: Insufficient documentation

## 2017-08-13 DIAGNOSIS — I69354 Hemiplegia and hemiparesis following cerebral infarction affecting left non-dominant side: Secondary | ICD-10-CM | POA: Insufficient documentation

## 2017-08-13 DIAGNOSIS — I6350 Cerebral infarction due to unspecified occlusion or stenosis of unspecified cerebral artery: Secondary | ICD-10-CM | POA: Diagnosis not present

## 2017-08-13 DIAGNOSIS — Z87891 Personal history of nicotine dependence: Secondary | ICD-10-CM | POA: Insufficient documentation

## 2017-08-13 DIAGNOSIS — G8194 Hemiplegia, unspecified affecting left nondominant side: Secondary | ICD-10-CM | POA: Diagnosis not present

## 2017-08-13 DIAGNOSIS — F329 Major depressive disorder, single episode, unspecified: Secondary | ICD-10-CM | POA: Diagnosis not present

## 2017-08-13 DIAGNOSIS — H539 Unspecified visual disturbance: Secondary | ICD-10-CM | POA: Diagnosis not present

## 2017-08-13 DIAGNOSIS — I69398 Other sequelae of cerebral infarction: Secondary | ICD-10-CM

## 2017-08-13 DIAGNOSIS — F1910 Other psychoactive substance abuse, uncomplicated: Secondary | ICD-10-CM | POA: Diagnosis not present

## 2017-08-13 DIAGNOSIS — F1020 Alcohol dependence, uncomplicated: Secondary | ICD-10-CM | POA: Insufficient documentation

## 2017-08-13 NOTE — Progress Notes (Signed)
Subjective:    Patient ID: Angel Davies, male    DOB: 12-Aug-1970, 47 y.o.   MRN: [id number] 47 y.o. male with history of ADHD, anxiety, depression/anxiety, fall 12/2016 with loss of peripheral vision and onset of HA, polysubtance abuse who was admitted on 07/02/17 with fall, slurred speech and left sided weakness. Patient reported that he was drinking ETOH and used cocaine the night before--UDS positive for cocaine and THC. MRI brain revealed "Acute RIGHT pontine infarct, Subacute small RIGHT temporal occipital lobe/PCA territory infarct and severe stenosis distal rigth P2 segment.  2D echo showed EF 20-25% with moderately dilated LV, akinesis of anterior and apical myocardium with grade 2 diastolic dysfunction.   HPI Seen by Dr Otho Darner at [facility] No falls Getting outpatient PT, OT  Outpatient speech therapy  Wants to resume driving  Patient states that he has some reduced vision in the periphery on the left side.  He has not had this checked up by an ophthalmologist.  He states that this has been since June 2018.  Reviewed MRI of his stroke that occurred in December 2018.  This is in the right pons.  We also discussed that he had a prior stroke right PCA distribution subacute.  We discussed that the age of this infarct was difficult to pinpoint and that he should discuss this with neurology.  Dressing and bathing mod I  Not smoking Had one glass of red wine No cocaine Pain Inventory Average Pain 1 Pain Right Now 0 My pain is .  In the last 24 hours, has pain interfered with the following? General activity 2 Relation with others 1 Enjoyment of life 2 What TIME of day is your pain at its worst? daytime Sleep (in general) Fair  Pain is worse with: walking Pain improves with: rest, heat/ice and therapy/exercise Relief from Meds: .  Mobility walk without assistance ability to climb steps?  yes do you drive?  yes  Function employed # of hrs/week  40  Neuro/Psych numbness tingling  Prior Studies Any changes since last visit?  no  Physicians involved in your care Any changes since last visit?  no   Family History  Problem Relation Age of Onset  . Stroke Mother   . Colon cancer Mother   . Prostate cancer Father   . Seizures Neg Hx    Social History   Socioeconomic History  . Marital status: Married    Spouse name: None  . Number of children: None  . Years of education: None  . Highest education level: None  Social Needs  . Financial resource strain: None  . Food insecurity - worry: None  . Food insecurity - inability: None  . Transportation needs - medical: None  . Transportation needs - non-medical: None  Occupational History  . None  Tobacco Use  . Smoking status: Former Smoker    Packs/day: 0.00    Years: 0.00    Pack years: 0.00  . Smokeless tobacco: Never Used  Substance and Sexual Activity  . Alcohol use: Yes    Alcohol/week: 2.4 oz    Types: 4 Glasses of wine per week  . Drug use: Yes    Types: Marijuana, "Crack" cocaine    Comment: 07/02/2017 "smokes marijuana a couple days/week"  . Sexual activity: Yes  Other Topics Concern  . None  Social History Narrative  . None   Past Surgical History:  Procedure Laterality Date  . CYSTOSCOPY WITH URETEROSCOPY, STONE BASKETRY AND STENT PLACEMENT    .  RIGHT/LEFT HEART CATH AND CORONARY ANGIOGRAPHY N/A 07/05/2017   Procedure: RIGHT/LEFT HEART CATH AND CORONARY ANGIOGRAPHY;  Surgeon: Dolores PattyBensimhon, Daniel R, MD;  [facility];  Service: Cardiovascular;  Laterality: N/A;   Past Medical History:  Diagnosis Date  . ADHD (attention deficit hyperactivity disorder)   . Anxiety   . CVA (cerebral vascular accident) (HCC) 07/02/2017   "left sided weakness" (07/02/2017)  . Depression   . History of kidney stones    BP 95/66   Pulse (!) 102   SpO2 95%   Opioid Risk Score:   Fall Risk Score:  `1  Depression screen PHQ 2/9  No flowsheet data  found.   Review of Systems  Constitutional: Positive for unexpected weight change.  HENT: Negative.   Eyes: Negative.   Respiratory: Negative.   Cardiovascular: Negative.   Gastrointestinal: Positive for abdominal pain.  Endocrine: Negative.   Genitourinary: Negative.   Musculoskeletal: Negative.   Skin: Negative.   Allergic/Immunologic: Negative.   Neurological: Negative.   Hematological: Negative.   Psychiatric/Behavioral: Negative.   All other systems reviewed and are negative.      Objective:   Physical Exam  Constitutional: He is oriented to person, place, and time. He appears well-developed and well-nourished. No distress.  HENT:  Head: Normocephalic and atraumatic.  Eyes: Conjunctivae and EOM are normal. Pupils are equal, round, and reactive to light.  Cardiovascular: Normal rate and regular rhythm. Exam reveals no friction rub.  No murmur heard. Pulmonary/Chest: Effort normal and breath sounds normal. No respiratory distress. He has no wheezes.  Abdominal: Soft. Bowel sounds are normal. He exhibits no distension. There is no tenderness.  Musculoskeletal: Normal range of motion.  Neurological: He is alert and oriented to person, place, and time. He displays no atrophy. No sensory deficit. He exhibits normal muscle tone. Gait abnormal.  Reflex Scores:      Tricep reflexes are 2+ on the right side and 3+ on the left side.      Bicep reflexes are 2+ on the right side and 3+ on the left side.      Brachioradialis reflexes are 2+ on the right side and 3+ on the left side.      Patellar reflexes are 2+ on the right side and 3+ on the left side.      Achilles reflexes are 2+ on the right side and 3+ on the left side. Normal sensation to pinprick in bilateral upper limbs.  Fine motor deficit noted with finger-thumb opposition mild left hand, positive dysdiadochokinesis rapid alternating supination pronation left upper limb.  There is decreased toe tapping in the left lower  limb. Patient is able to ambulate without assistive device.  He is able to Toe Walk and Heel Walk but is unable to perform tandem gait.  Skin: Skin is warm and dry. He is not diaphoretic.  Psychiatric: He has a normal mood and affect.  Nursing note and vitals reviewed.   Oriented to person, place time       Assessment & Plan:  1.  Left hemiparesis secondary to right pontine infarct onset in December 2018 .  Patient has regained modified independent status.  He still not driving and will need neuro-ophthalmology evaluation prior to okaying this. Concern is for left visual field loss.  Does have a right PCA distribution infarct.  Cannot pick this up on confrontation testing however would like to have formal visual field testing prior to advising patient to return to driving.  Patient will  follow up with neurology and discuss his stroke etiology and workup.  2.  Coronary artery disease, severe with probable ischemic cardiomyopathy.  He will follow-up with cardiology.  Anticipate need for cardiac rehabilitation after neuro rehabilitation is concluded.

## 2017-08-13 NOTE — Patient Instructions (Addendum)
No driving until visual fields check lipitor can cause cramps

## 2017-08-13 NOTE — Patient Outreach (Signed)
[facility]) Care Management  08/13/2017  Angel Davies 03/24/1971 [id number]   CSW was able to make initial contact with patient today to perform phone assessment, as well as assess and assist with social work needs and services.  CSW introduced self, explained role and types of services provided through [facility] Management [facility] Care Management).  CSW further explained to patient that CSW works with patient's Telephonic RNCM, also with [facility], Angel Davies. CSW then explained the reason for the call, indicating that Angel Davies thought that patient would benefit from social work services and resources to assist with obtaining transportation services to and from physician appointments.  CSW obtained two HIPAA compliant identifiers from patient, which included patient's name and date of birth. Patient admitted that he has a very good support system and that he has currently been able to get rides from multiple family members and friends.  However, patient reported, "It wouldn't be a bad idea to have a back-up plan, just in case".  CSW spoke with patient about public transportation services through [employer] which patient was agreeable too.  CSW agreed to submit a [employer] application on patient's behalf, as well as mail an application to patient's home.  CSW also agreed to mail patient a [place] for [place]. CSW will then follow-up with patient in one week to ensure that patient received the packet of resource information, as well as answer any questions he may have at that time.  Patient voiced understanding and was agreeable to this plan. [facility] CM Care Plan Problem One     Most Recent Value  Care Plan Problem One  Lack of transportation to and from physician appointments.  Role Documenting the Problem One  Clinical Social Worker  Care Plan for Problem One  Active  [facility] Long Term Goal    Patient will have secure transportation services in place, within the next 45 days.  [facility] Long Term Goal Start Date  08/13/17  Interventions for Problem One Long Term Goal  CSW will assist patient with arranging secure transportation services to and from his physician appointments.  [facility] CM Short Term Goal #1   Patient will utilize list of transportation resources to get to and from physician appointments, within the next three weeks.  [facility] CM Short Term Goal #1 Start Date  08/13/17  Interventions for Short Term Goal #1  List of transportation resources has been mailed to patient's home for his review and assistance.  [facility] CM Short Term Goal #2   Patient will consider using public transportation services through [employer], within the next three weeks.  [facility] CM Short Term Goal #2 Start Date  08/13/17  Interventions for Short Term Goal #2  CSW has mailed patient a [employer] application, as well as completed and submitted an application on his behalf to [facility] for processing.     Danford BadJoanna Santos Davies, BSW, MSW, LCSW  Licensed Restaurant manager, fast foodClinical Social Worker  [facility]  [address] [place], [place], [place] [address] Physical Address-[address], [place], [place] [address] Toll Free Main # [phone] Fax # [phone] Cell # [phone]  Office # [phone] [email or web address]

## 2017-08-13 NOTE — Telephone Encounter (Signed)
This encounter was created in error - please disregard.

## 2017-08-14 ENCOUNTER — Ambulatory Visit: Payer: Managed Care, Other (non HMO) | Admitting: Physical Therapy

## 2017-08-14 ENCOUNTER — Ambulatory Visit (HOSPITAL_COMMUNITY)
Admission: RE | Admit: 2017-08-14 | Discharge: 2017-08-14 | Disposition: A | Discharge status: Home / Self Care | Payer: Managed Care, Other (non HMO) | Source: Ambulatory Visit | Attending: Cardiology | Admitting: Cardiology

## 2017-08-14 ENCOUNTER — Ambulatory Visit: Payer: Managed Care, Other (non HMO) | Admitting: Occupational Therapy

## 2017-08-14 ENCOUNTER — Encounter: Payer: Self-pay | Admitting: Occupational Therapy

## 2017-08-14 DIAGNOSIS — R2689 Other abnormalities of gait and mobility: Secondary | ICD-10-CM

## 2017-08-14 DIAGNOSIS — M6281 Muscle weakness (generalized): Secondary | ICD-10-CM

## 2017-08-14 DIAGNOSIS — R278 Other lack of coordination: Secondary | ICD-10-CM

## 2017-08-14 DIAGNOSIS — I69354 Hemiplegia and hemiparesis following cerebral infarction affecting left non-dominant side: Secondary | ICD-10-CM

## 2017-08-14 DIAGNOSIS — I255 Ischemic cardiomyopathy: Secondary | ICD-10-CM | POA: Insufficient documentation

## 2017-08-14 DIAGNOSIS — R2681 Unsteadiness on feet: Secondary | ICD-10-CM

## 2017-08-14 DIAGNOSIS — R293 Abnormal posture: Secondary | ICD-10-CM

## 2017-08-14 DIAGNOSIS — I69118 Other symptoms and signs involving cognitive functions following nontraumatic intracerebral hemorrhage: Secondary | ICD-10-CM

## 2017-08-14 DIAGNOSIS — I69352 Hemiplegia and hemiparesis following cerebral infarction affecting left dominant side: Secondary | ICD-10-CM | POA: Diagnosis not present

## 2017-08-14 LAB — BASIC METABOLIC PANEL WITH GFR
Anion gap: 9 (ref 5–15)
BUN: 16 mg/dL (ref 6–20)
CO2: 25 mmol/L (ref 22–32)
Calcium: 9.9 mg/dL (ref 8.9–10.3)
Chloride: 106 mmol/L (ref 101–111)
Creatinine, Ser: 0.91 mg/dL (ref 0.61–1.24)
GFR calc Af Amer: >60 mL/min
GFR calc non Af Amer: >60 mL/min
Glucose, Bld: 105 mg/dL — ABNORMAL HIGH (ref 65–99)
Potassium: 4.4 mmol/L (ref 3.5–5.1)
Sodium: 140 mmol/L (ref 135–145)

## 2017-08-14 NOTE — Therapy (Signed)
[facility] [address] [place], [place], [address] Phone: [phone]   Fax:  [phone]  Occupational Therapy Treatment  Patient Details  Name: Angel Davies MRN: [id number] Date of Birth: 1971-04-30 Referring Provider: Dr. Claudette Laws   Encounter Date: 08/14/2017  OT End of Session - 08/14/17 1129    Visit Number  2    Number of Visits  16    Date for OT Re-Evaluation  10/01/17    Authorization Type  [employer] Managed - no auth await info on any visit limitations    OT Start Time  [id number]    OT Stop Time  1017    OT Time Calculation (min)  46 min    Activity Tolerance  Patient tolerated treatment well       Past Medical History:  Diagnosis Date  . ADHD (attention deficit hyperactivity disorder)   . Anxiety   . CVA (cerebral vascular accident) (HCC) 07/02/2017   "left sided weakness" (07/02/2017)  . Depression   . History of kidney stones     Past Surgical History:  Procedure Laterality Date  . CYSTOSCOPY WITH URETEROSCOPY, STONE BASKETRY AND STENT PLACEMENT    . RIGHT/LEFT HEART CATH AND CORONARY ANGIOGRAPHY N/A 07/05/2017   Procedure: RIGHT/LEFT HEART CATH AND CORONARY ANGIOGRAPHY;  Surgeon: Dolores Patty, MD;  [facility]: [facility] IN[facility];  Service: Cardiovascular;  Laterality: N/A;    There were no vitals filed for this visit.  Subjective Assessment - 08/14/17 0933    Subjective   I found out that I had a stroke before the one that put me in the hospital.     Pertinent History  Pt with R pontine CVA, ADHD, anxiety, depression, poly substance abuse, severe stenosis posterior territory, severe CAD, subacute termporoccipital CVA    Currently in Pain?  No/denies    Pain Score  -- My back pain can get to a 4 but I don't have any now                   OT Treatments/Exercises (OP) - 08/14/17 0001      ADLs   ADL Comments  Reviewed goals and pt in agreement - provided written copy of  goals.  Also provided handouts regarding stroke risk factors and warning sign and symptoms.  Pt stated he did receive a "book" from the hospital but " my memory isn't great and its too much information."  Pt able to verbalize understanding after discussion of hand outs.        Exercises   Exercises  Hand      Hand Exercises   Other Hand Exercises  Instructed pt in HEP focused on grip and pinch strength (green putty) and coordination - see pt instruction for details.  Pt able to return demnonstrate all activities after instruction and practice.               OT Education - 08/14/17 1127    Education provided  Yes    Education Details  HEP green putty and coordination    Person(s) Educated  Patient    Methods  Explanation;Demonstration;Handout    Comprehension  Verbalized understanding;Returned demonstration       OT Short Term Goals - 08/14/17 1127      OT SHORT TERM GOAL #1   Title  Pt will be mod I with HEP for LUE proximal strengthening, grip strength and coordination - 09/03/2017    Status  On-going  OT SHORT TERM GOAL #2   Title  Pt will demonstrate improved grip strength by at least 8 pounds to assist with opening containers - baseline = 45    Status  On-going      OT SHORT TERM GOAL #3   Title  Pt will demonstrate improved coordination as evidenced by decreasing time on 9 hole peg by at least 10 seconds to assist with tying shoes, other fine motor tasks.    Baseline  53.38    Status  On-going      OT SHORT TERM GOAL #4   Title  Pt will be able to pick up 5 pound object and place in overhead cabinet x5 without dropping and with postural alignment     Status  On-going      OT SHORT TERM GOAL #5   Title  Pt will tolerate at least 20 minutes of ambulatory or standing activity without rest break    Status  On-going        OT Long Term Goals - 08/14/17 1128      OT LONG TERM GOAL #1   Title  Pt will be mod I with upgraded HEP prn - 10/01/3016    Status  On-going       OT LONG TERM GOAL #2   Title  Pt will demonstrate improve grip strength by at least 12 pounds to assist with using LUE as non dominant in house mgmt tasks (baseline = 45)    Status  On-going      OT LONG TERM GOAL #3   Title  Pt will demonstrate improved coordination as evidenced by decreasing time on 9 hole peg by at least 15 seconds to assist with fine motor tasks (baseline= 53.38)    Status  On-going      OT LONG TERM GOAL #4   Title  Pt will tolerate at least 35 minutes of ambulatory or standing activity without rest breaks.     Status  On-going      OT LONG TERM GOAL #5   Title  Pt will demonstrate ability to lift 8 pound object into overhead cabinet x4 without dropping and with good postural alignment    Status  On-going      OT LONG TERM GOAL #6   Title  Pt will be mod I with putting and driving of golf ball on outdoor, uneven surfaces    Status  On-going            Plan - 08/14/17 1128    Clinical Impression Statement  Pt in agreement with POC and goals.  Pt progressing toward goals and is motivated.      Rehab Potential  Good    OT Frequency  2x / week    OT Duration  8 weeks    OT Treatment/Interventions  Self-care/ADL training;Aquatic Therapy;Moist Heat;Therapeutic exercise;Neuromuscular education;Functional Mobility Training;DME and/or AE instruction;Manual Therapy;Cognitive remediation/compensation;Therapeutic activities;Patient/family education;Balance training    Plan  check HEP for grip and coordination, NMR for LUE proximal control (scapular aligment and stability)/trunk, balance, activity tolerance, resistive overhead reach    Consulted and Agree with Plan of Care  Patient       Patient will benefit from skilled therapeutic intervention in order to improve the following deficits and impairments:  Abnormal gait, Decreased activity tolerance, Decreased balance, Decreased coordination, Decreased cognition, Decreased mobility, Difficulty walking, Decreased  strength, Impaired UE functional use, Impaired sensation  Visit Diagnosis: Hemiplegia and hemiparesis following cerebral infarction affecting left non-dominant  side (HCC)  Muscle weakness (generalized)  Other lack of coordination  Abnormal posture  Unsteadiness on feet  Other symptoms and signs involving cognitive functions following nontraumatic intracerebral hemorrhage    Problem List Patient Active Problem List   Diagnosis Date Noted  . Chronic systolic heart failure (HCC) 08/07/2017  . Encounter for therapeutic drug monitoring 07/27/2017  . Muscle pain   . Supratherapeutic INR   . Leukocytosis   . Coronary artery disease involving native coronary artery of native heart without angina pectoris   . Prediabetes   . Hemorrhoids   . 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 Left hemiparesis (HCC)   . Cardiomyopathy, ischemic   . 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 Myocardiopathy (HCC)   . Mixed hyperlipidemia   . CVA (cerebral vascular accident) (HCC) 07/02/2017  . Alcohol use 07/02/2017  . Anxiety 07/02/2017  . Depression 07/02/2017  . Acute hyperglycemia 07/02/2017  . Elevated blood-pressure reading without diagnosis of hypertension 07/02/2017  . ADHD 07/02/2017  . Cocaine abuse (HCC) 07/02/2017  . Marijuana abuse 06/20/2017  . Cocaine use 06/20/2017    Norton PastelPulaski, Karen Halliday, OTR/L 08/14/2017, 11:30 AM  [facility] [address] [place], [place], [address] Phone: [phone]   Fax:  [phone]  Name: Claretha CooperJames D Floresca MRN: [id number] Date of Birth: 02/26/1971

## 2017-08-14 NOTE — Patient Outreach (Signed)
Request received from Joanna Saporito, LCSW to mail patient personal care resources.  Information mailed today. 

## 2017-08-14 NOTE — Patient Instructions (Signed)
  Coordination Activities  Perform the following activities for 15-20 minutes 1-2  times per day with left hand(s).   Rotate ball in fingertips (clockwise and counter-clockwise).  Toss ball between hands.  Toss ball in air and catch with the same hand.  Flip cards 1 at a time as fast as you can. Alternate pulling from the bottom and then from the top.   Deal cards with your thumb (Hold deck in hand and push card off top with thumb).  Pick up coins and place in container or coin bank.  Pick up coins and stack.  Pick up coins one at a time until you get 5-10 in your hand, then move coins from palm to fingertips to stack one at a time.  Screw together nuts and bolts, then unfasten.  Use small nuts and bolts.  Focus speed with this one.    1. Grip Strengthening (Resistive Putty)   Squeeze putty using thumb and all fingers. Repeat _20___ times. Do __2__ sessions per day.   2. Roll putty into tube on table and pinch between index, middle and thumb. Do 5 times.  Do 2 sessions per day.     Copyright  VHI. All rights reserved.

## 2017-08-15 ENCOUNTER — Encounter: Payer: Self-pay | Admitting: Physical Therapy

## 2017-08-15 ENCOUNTER — Ambulatory Visit (INDEPENDENT_AMBULATORY_CARE_PROVIDER_SITE_OTHER): Payer: Managed Care, Other (non HMO) | Admitting: *Deleted

## 2017-08-15 ENCOUNTER — Other Ambulatory Visit: Payer: Self-pay

## 2017-08-15 DIAGNOSIS — I255 Ischemic cardiomyopathy: Secondary | ICD-10-CM | POA: Diagnosis not present

## 2017-08-15 DIAGNOSIS — I236 Thrombosis of atrium, auricular appendage, and ventricle as current complications following acute myocardial infarction: Secondary | ICD-10-CM | POA: Diagnosis not present

## 2017-08-15 DIAGNOSIS — I6350 Cerebral infarction due to unspecified occlusion or stenosis of unspecified cerebral artery: Secondary | ICD-10-CM | POA: Diagnosis not present

## 2017-08-15 DIAGNOSIS — Z5181 Encounter for therapeutic drug level monitoring: Secondary | ICD-10-CM

## 2017-08-15 LAB — POCT INR: INR: 2.4

## 2017-08-15 NOTE — Progress Notes (Signed)
   08/15/17 0921  Functional Gait  Assessment  Change in Gait Speed 2

## 2017-08-15 NOTE — Progress Notes (Signed)
   08/15/17 0903  Functional Gait  Assessment  Gait assessed  Yes

## 2017-08-15 NOTE — Therapy (Signed)
[facility] [address] [place], [place], [address] Phone: [phone]   Fax:  [phone]  Physical Therapy Treatment  Patient Details  Name: Angel CooperJames D Reidel MRN: [id number] Date of Birth: 12/19/1970 Referring Provider: Dr. Claudette LawsAndrew Kirsteins   Encounter Date: 08/14/2017  PT End of Session - 08/15/17 0910    Visit Number  2    Number of Visits  9    Date for PT Re-Evaluation  09/06/17    Authorization Type  [employer] - await info on visit limit    PT Start Time  0850    PT Stop Time  0932    PT Time Calculation (min)  42 min       Past Medical History:  Diagnosis Date  . ADHD (attention deficit hyperactivity disorder)   . Anxiety   . CVA (cerebral vascular accident) (HCC) 07/02/2017   "left sided weakness" (07/02/2017)  . Depression   . History of kidney stones     Past Surgical History:  Procedure Laterality Date  . CYSTOSCOPY WITH URETEROSCOPY, STONE BASKETRY AND STENT PLACEMENT    . RIGHT/LEFT HEART CATH AND CORONARY ANGIOGRAPHY N/A 07/05/2017   Procedure: RIGHT/LEFT HEART CATH AND CORONARY ANGIOGRAPHY;  Surgeon: Dolores PattyBensimhon, Daniel R, MD;  [facility];  Service: Cardiovascular;  Laterality: N/A;    There were no vitals filed for this visit.  Subjective Assessment - 08/15/17 0901    Subjective  Pt states he found out yesterday that he has actually had 2 strokes and not just the one:  states Dr. Wynn BankerKirsteins wants neuroopthalmology referral before he can resume driving    Pertinent History  Rt pontine CVA on 07-02-17: alcohol use; cocaine abuse:  anxiety/depression:  myocardiopathy;  acute systolic heart failure; s/p Rt/Lt heart cath & coronary angiography (07-05-17)    Patient Stated Goals  improve balance and gait; increase endurance    Currently in Pain?  No/denies                      [facility]/Ex[facility] - 08/15/17 0001      Exercises   Exercises  Knee/Hip;Ankle      Lumbar Exercises: Quadruped   Straight Leg Raise  5 reps LLE    Opposite Arm/Leg Raise  Right arm/Left leg;Left arm/Right leg;Other (comment) 3 reps each side    Opposite Arm/Leg Raise Limitations  Lt hip extensor and core weakness limits stability with this ex.      Knee/Hip Exercises: Standing   Heel Raises  Both;1 set;10 reps    Forward Step Up  Left;1 set;10 reps;Step Height: 6";Hand Hold: 0;Hand Hold: 1    Step Down  Left;1 set;10 reps;Step Height: 6";Hand Hold: 1    Other Standing Knee Exercises  unilateral LLE heel raise 10 reps      TherEx:  Quadruped exercise; Lt hip extended - min assist to hold LLE in extension - Lt knee flexion/extension x 10 reps Squats x 10 reps in tall kneeling  Lt dorsiflexion - seated position with green theraband x 10 reps with 3 sec hold (reviewed this exercise for HEP as pt reported problem with it at home)  Pt amb. On tip toes with knees flexed inside // bars 10'x4 reps for LLE strengthening  Pt performed Lt closed chain plantarflexion off side of high/low mat table Rt foot on [employer] step for incr. LLE weight-bearing 10 reps x 2 sets    Gait;    Functional Gait  assessment - score 25/30   PT Education - 08/15/17 0908    Education provided  Yes    Education Details  reviewed Lt dorsiflexion exercise - in seated position, (not in long sitting as picture shows); pt instructed to place foot on floor and hold band with Rt foot    Methods  Explanation;Demonstration handout for this ex previously given    Comprehension  Verbalized understanding          PT Long Term Goals - 08/07/17 2030      PT LONG TERM GOAL #1   Title  Increase gait velocity from 3.78 ft/sec to >/= 4.1 ft/sec for incr. gait efficiency.    Baseline  8.68 secs = 3.78 ft/sec on 08-06-17    Time  4    Period  Weeks    Status  New    Target Date  09/06/17      PT LONG TERM GOAL #2   Title  Pt will amb. for 10" nonstop with minimal c/o fatigue, i.e. </= 2/10 intensity of  fatigue.    Time  4    Period  Weeks    Status  New    Target Date  09/06/17      PT LONG TERM GOAL #3   Title  Pt will increase FGA score by at least 5 points to demo improved balance with gait.    Baseline  TBA    Time  4    Period  Weeks    Status  New    Target Date  09/06/17      PT LONG TERM GOAL #4   Title  Independent in HEP for LLE strengthening and balance.    Time  4    Period  Weeks    Status  New    Target Date  09/06/17      PT LONG TERM GOAL #5   Title  Negotiate 4 steps without rail using a step over step sequence with supervision to demo improved balance.    Time  4    Period  Weeks    Status  New    Target Date  09/06/17            Plan - 08/15/17 0910    Clinical Impression Statement  Pt has LLE weakness resulting in minimal gait deviations, however, scored well on FGA with score 25/30.  Pt also demonstrates decr. muscle endurance with Lt foot slap noted with prolonged amb. distance due to decr. eccentric Lt dorsiflexor strength.    Rehab Potential  Good    PT Frequency  2x / week    PT Duration  4 weeks    PT Treatment/Interventions  ADLs/Self Care Home Management;Gait training;Stair training;Therapeutic activities;Therapeutic exercise;Patient/family education;Neuromuscular re-education;Balance training    PT Next Visit Plan  cont LLE strengthening; core stabilization; needs Lt plantarflexor strengthening in closed chain    Consulted and Agree with Plan of Care  Patient;Family member/caregiver       Patient will benefit from skilled therapeutic intervention in order to improve the following deficits and impairments:  Abnormal gait, Cardiopulmonary status limiting activity, Decreased activity tolerance, Decreased balance, Decreased cognition, Decreased endurance, Decreased coordination, Decreased strength, Impaired sensation, Impaired UE functional use, Other (comment)  Visit Diagnosis: Muscle weakness (generalized)  Other abnormalities of gait  and mobility     Problem List Patient Active Problem List   Diagnosis Date Noted  . Chronic systolic heart failure (HCC) 08/07/2017  . Encounter for therapeutic  drug monitoring 07/27/2017  . Muscle pain   . Supratherapeutic INR   . Leukocytosis   . Coronary artery disease involving native coronary artery of native heart without angina pectoris   . Prediabetes   . Hemorrhoids   . 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 Left hemiparesis (HCC)   . Cardiomyopathy, ischemic   . 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 Myocardiopathy (HCC)   . Mixed hyperlipidemia   . CVA (cerebral vascular accident) (HCC) 07/02/2017  . Alcohol use 07/02/2017  . Anxiety 07/02/2017  . Depression 07/02/2017  . Acute hyperglycemia 07/02/2017  . Elevated blood-pressure reading without diagnosis of hypertension 07/02/2017  . ADHD 07/02/2017  . Cocaine abuse (HCC) 07/02/2017  . Marijuana abuse 06/20/2017  . Cocaine use 06/20/2017    DildayDonavan Burnet, PT 08/15/2017, 9:24 AM  [facility] [address] [place], [place], [address] Phone: [phone]   Fax:  [phone]  Name: IKEY OMARY MRN: [id number] Date of Birth: 1970/12/02

## 2017-08-15 NOTE — Patient Outreach (Signed)
[facility] Care Management  08/15/2017  Claretha CooperJames D Ruben 01/28/1971 [id number]  EMMI: stroke Referral date: 08/10/17 Referral source: EMMI stroke red alert Referral reason: went to follow up appointment: NO   Telephone call to patient regarding EMMI stroke follow up.  Attempted call x 4Unable to reach patient or leave voice message.  Message stated, " your call cannot be completed as dialed.   PLAN:  RNCM will attempt 2nd telephone call to patient within 1 week.   George InaDavina Iesha Summerhill RN,BSN,CCM [facility] Telephonic  [phone]

## 2017-08-15 NOTE — Patient Instructions (Signed)
Description   Continue same dose of coumadin 2mg (1 tablet) daily except Coumadin 3mg (1.5 tablets) on Saturdays.   Recheck INR in 1 week.      

## 2017-08-16 ENCOUNTER — Encounter: Payer: Self-pay | Admitting: Physical Therapy

## 2017-08-16 ENCOUNTER — Ambulatory Visit: Payer: Managed Care, Other (non HMO) | Admitting: Physical Therapy

## 2017-08-16 ENCOUNTER — Ambulatory Visit: Payer: Managed Care, Other (non HMO) | Admitting: Occupational Therapy

## 2017-08-16 DIAGNOSIS — M6281 Muscle weakness (generalized): Secondary | ICD-10-CM

## 2017-08-16 DIAGNOSIS — R2681 Unsteadiness on feet: Secondary | ICD-10-CM

## 2017-08-16 DIAGNOSIS — R278 Other lack of coordination: Secondary | ICD-10-CM

## 2017-08-16 DIAGNOSIS — I69352 Hemiplegia and hemiparesis following cerebral infarction affecting left dominant side: Secondary | ICD-10-CM | POA: Diagnosis not present

## 2017-08-16 DIAGNOSIS — I69354 Hemiplegia and hemiparesis following cerebral infarction affecting left non-dominant side: Secondary | ICD-10-CM

## 2017-08-16 NOTE — Patient Instructions (Signed)
Feet Partial Heel-Toe (Compliant Surface) Head Motion - Eyes Closed    Stand on compliant surface: __pillows, folded blankets______ with right foot partially in front of the other. Close eyes and hold for 30 seconds. With eyes open move head slowly, up and down x 10, left and right x 10. Repeat with left foot in front.  Do __1__ sessions per day.  Copyright  VHI. All rights reserved.

## 2017-08-16 NOTE — Therapy (Signed)
[facility] [address] [place], [place], [address] Phone: [phone]   Fax:  [phone]  Occupational Therapy Treatment  Patient Details  Name: Angel Davies MRN: [id number] Date of Birth: 12/06/1970 Referring Provider: Dr. Claudette LawsAndrew Kirsteins   Encounter Date: 08/16/2017  OT End of Session - 08/16/17 1213    Visit Number  3    Number of Visits  16    Date for OT Re-Evaluation  10/01/17    Authorization Type  [employer] Managed - no auth await info on any visit limitations    OT Start Time  1108    OT Stop Time  1146    OT Time Calculation (min)  38 min    Activity Tolerance  Patient tolerated treatment well       Past Medical History:  Diagnosis Date  . ADHD (attention deficit hyperactivity disorder)   . Anxiety   . CVA (cerebral vascular accident) (HCC) 07/02/2017   "left sided weakness" (07/02/2017)  . Depression   . History of kidney stones     Past Surgical History:  Procedure Laterality Date  . CYSTOSCOPY WITH URETEROSCOPY, STONE BASKETRY AND STENT PLACEMENT    . RIGHT/LEFT HEART CATH AND CORONARY ANGIOGRAPHY N/A 07/05/2017   Procedure: RIGHT/LEFT HEART CATH AND CORONARY ANGIOGRAPHY;  Surgeon: Dolores PattyBensimhon, Daniel R, MD;  Location[facility];  Service: Cardiovascular;  Laterality: N/A;    There were no vitals filed for this visit.  Subjective Assessment - 08/16/17 1108    Pertinent History  Pt with R pontine CVA, ADHD, anxiety, depression, poly substance abuse, severe stenosis posterior territory, severe CAD, subacute termporoccipital CVA    Currently in Pain?  Yes    Pain Score  4     Pain Location  Back    Pain Orientation  Lower    Pain Descriptors / Indicators  Tightness    Pain Type  Chronic pain    Pain Onset  More than a month ago    Aggravating Factors   walking    Pain Relieving Factors  rest heat                 Treatment : Supine chest press and shoulder flexion with  bilateral UE's  With min faciltation at scapula for positioning Prone with LUE over edge of mat performing shoulder extension x 10 reps, bilateral shoulder extension  X 10 reps min v.c  Prone on elbows lifting chest x 10 reps,  Seated AA/ROM closed chain shoulder flexion with min -mod facilitation left scapula/ shoulder          OT Education - 08/16/17 1119    Education provided  Yes    Education Details  Reviewed coordination and putty HEP for grip and pinch    Person(s) Educated  Patient    Methods  Explanation;Demonstration    Comprehension  Verbalized understanding;Returned demonstration       OT Short Term Goals - 08/14/17 1127      OT SHORT TERM GOAL #1   Title  Pt will be mod I with HEP for LUE proximal strengthening, grip strength and coordination - 09/03/2017    Status  On-going      OT SHORT TERM GOAL #2   Title  Pt will demonstrate improved grip strength by at least 8 pounds to assist with opening containers - baseline = 45    Status  On-going      OT SHORT TERM GOAL #3  Title  Pt will demonstrate improved coordination as evidenced by decreasing time on 9 hole peg by at least 10 seconds to assist with tying shoes, other fine motor tasks.    Baseline  53.38    Status  On-going      OT SHORT TERM GOAL #4   Title  Pt will be able to pick up 5 pound object and place in overhead cabinet x5 without dropping and with postural alignment     Status  On-going      OT SHORT TERM GOAL #5   Title  Pt will tolerate at least 20 minutes of ambulatory or standing activity without rest break    Status  On-going        OT Long Term Goals - 08/14/17 1128      OT LONG TERM GOAL #1   Title  Pt will be mod I with upgraded HEP prn - 10/01/3016    Status  On-going      OT LONG TERM GOAL #2   Title  Pt will demonstrate improve grip strength by at least 12 pounds to assist with using LUE as non dominant in house mgmt tasks (baseline = 45)    Status  On-going      OT LONG TERM  GOAL #3   Title  Pt will demonstrate improved coordination as evidenced by decreasing time on 9 hole peg by at least 15 seconds to assist with fine motor tasks (baseline= 53.38)    Status  On-going      OT LONG TERM GOAL #4   Title  Pt will tolerate at least 35 minutes of ambulatory or standing activity without rest breaks.     Status  On-going      OT LONG TERM GOAL #5   Title  Pt will demonstrate ability to lift 8 pound object into overhead cabinet x4 without dropping and with good postural alignment    Status  On-going      OT LONG TERM GOAL #6   Title  Pt will be mod I with putting and driving of golf ball on outdoor, uneven surfaces    Status  On-going            Plan - 08/16/17 1211    Clinical Impression Statement  Pt is progressing towards goals. He returned demonstration of HEP.    Occupational performance deficits (Please refer to evaluation for details):  ADL's;IADL's;Work;Leisure;Social Participation    Rehab Potential  Good    OT Frequency  2x / week    OT Duration  8 weeks    OT Treatment/Interventions  Self-care/ADL training;Aquatic Therapy;Moist Heat;Therapeutic exercise;Neuromuscular education;Functional Mobility Training;DME and/or AE instruction;Manual Therapy;Cognitive remediation/compensation;Therapeutic activities;Patient/family education;Balance training    Plan  NMR for LUE proximal control (scapular aligment and stability)/trunk, balance, activity tolerance, resistive overhead reach    Consulted and Agree with Plan of Care  Patient       Patient will benefit from skilled therapeutic intervention in order to improve the following deficits and impairments:  Abnormal gait, Decreased activity tolerance, Decreased balance, Decreased coordination, Decreased cognition, Decreased mobility, Difficulty walking, Decreased strength, Impaired UE functional use, Impaired sensation  Visit Diagnosis: Hemiplegia and hemiparesis following cerebral infarction affecting  left non-dominant side (HCC)  Muscle weakness (generalized)  Other lack of coordination    Problem List Patient Active Problem List   Diagnosis Date Noted  . Chronic systolic heart failure (HCC) 08/07/2017  . Encounter for therapeutic drug monitoring 07/27/2017  . Muscle pain   .  Supratherapeutic INR   . Leukocytosis   . Coronary artery disease involving native coronary artery of native heart without angina pectoris   . Prediabetes   . Hemorrhoids   . 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 Left hemiparesis (HCC)   . Cardiomyopathy, ischemic   . 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 Myocardiopathy (HCC)   . Mixed hyperlipidemia   . CVA (cerebral vascular accident) (HCC) 07/02/2017  . Alcohol use 07/02/2017  . Anxiety 07/02/2017  . Depression 07/02/2017  . Acute hyperglycemia 07/02/2017  . Elevated blood-pressure reading without diagnosis of hypertension 07/02/2017  . ADHD 07/02/2017  . Cocaine abuse (HCC) 07/02/2017  . Marijuana abuse 06/20/2017  . Cocaine use 06/20/2017    Terrilynn Postell 08/16/2017, 12:14 PM  [facility] [address] [place], [place], [address] Phone: [phone]   Fax:  [phone]  Name: Angel Davies MRN: [id number] Date of Birth: [birth year]

## 2017-08-16 NOTE — Therapy (Signed)
[facility] [address] [place], [place], [address] Phone: [phone]   Fax:  [phone]  Physical Therapy Treatment  Patient Details  Name: Angel Davies MRN: [id number] Date of Birth: 1971-03-26 Referring Provider: Dr. Claudette Laws   Encounter Date: 08/16/2017  PT End of Session - 08/16/17 1329    Visit Number  3    Number of Visits  9    Date for PT Re-Evaluation  09/06/17    Authorization Type  [employer] - await info on visit limit    PT Start Time  0848    PT Stop Time  0929    PT Time Calculation (min)  41 min    Activity Tolerance  Patient tolerated treatment well    Behavior During Therapy  [facility] for tasks assessed/performed       Past Medical History:  Diagnosis Date  . ADHD (attention deficit hyperactivity disorder)   . Anxiety   . CVA (cerebral vascular accident) (HCC) 07/02/2017   "left sided weakness" (07/02/2017)  . Depression   . History of kidney stones     Past Surgical History:  Procedure Laterality Date  . CYSTOSCOPY WITH URETEROSCOPY, STONE BASKETRY AND STENT PLACEMENT    . RIGHT/LEFT HEART CATH AND CORONARY ANGIOGRAPHY N/A 07/05/2017   Procedure: RIGHT/LEFT HEART CATH AND CORONARY ANGIOGRAPHY;  Surgeon: Dolores Patty, MD;  [facility];  Service: Cardiovascular;  Laterality: N/A;    There were no vitals filed for this visit.  Subjective Assessment - 08/16/17 0852    Subjective  I found out I've had 2 strokes. I went to the hospital 6 mos ago and they didn't find it. Only did a CT scan then. When they did the MRI this time they saw the old stroke.     Pertinent History  Rt pontine CVA on 07-02-17: alcohol use; cocaine abuse:  anxiety/depression:  myocardiopathy;  acute systolic heart failure; s/p Rt/Lt heart cath & coronary angiography (07-05-17)    Patient Stated Goals  improve balance and gait; increase endurance    Currently in Pain?  No/denies                       [facility] Adult PT Treatment/Exercise - 08/16/17 0001      Bed Mobility   Supine to Sit  6: Modified independent (Device/Increase time)    Sit to Supine  6: Modified independent (Device/Increase time)      Transfers   Transfers  Sit to Stand;Stand to Sit    Sit to Stand  7: Independent    Stand to Sit  7: Independent      Ambulation/Gait   Ambulation/Gait  Yes    Ambulation/Gait Assistance  6: Modified independent (Device/Increase time)    Ambulation Distance (Feet)  100 Feet 120, 160    Assistive device  None    Gait Pattern  Step-through pattern;Decreased step length - right    Ambulation Surface  Indoor      Lumbar Exercises: Quadruped   Opposite Arm/Leg Raise  Right arm/Left leg;Left arm/Right leg;10 reps;5 seconds    Opposite Arm/Leg Raise Limitations  Lt hip extensor & Lt shoulder and core weakness limits stability with this ex.      Knee/Hip Exercises: Standing   Functional Squat  1 set;10 reps on blue airex      Knee/Hip Exercises: Supine   Bridges  Strengthening;Both;10 reps    Other Supine Knee/Hip Exercises  deep squats  x 10    Other Supine Knee/Hip Exercises  bridge with marching x 10          Balance Exercises - 08/16/17 1324      Balance Exercises: Standing   Standing Eyes Opened  Narrow base of support (BOS);Head turns;Foam/compliant surface blue airex +1" foam    Standing Eyes Closed  Wide (BOA);Foam/compliant surface blue airex +1" foam    Tandem Stance  Eyes open    Rockerboard  Anterior/posterior;Head turns;EO;10 reps push into PF x 5 sec x10; DF x 5 sec x 10    Gait with Head Turns  Forward lt/rt; up/down; diagonals    Tandem Gait  Forward;Retro;Intermittent upper extremity support;4 reps // bars    Sidestepping  -- 6 reps, knees bent,         PT Education - 08/16/17 1329    Education provided  Yes    Education Details  addition to [employer]    Person(s) Educated  Patient    Methods  Explanation;Demonstration;Verbal  cues;Handout    Comprehension  Verbalized understanding;Returned demonstration;Need further instruction          PT Long Term Goals - 08/07/17 2030      PT LONG TERM GOAL #1   Title  Increase gait velocity from 3.78 ft/sec to >/= 4.1 ft/sec for incr. gait efficiency.    Baseline  8.68 secs = 3.78 ft/sec on 08-06-17    Time  4    Period  Weeks    Status  New    Target Date  09/06/17      PT LONG TERM GOAL #2   Title  Pt will amb. for 10" nonstop with minimal c/o fatigue, i.e. </= 2/10 intensity of fatigue.    Time  4    Period  Weeks    Status  New    Target Date  09/06/17      PT LONG TERM GOAL #3   Title  Pt will increase FGA score by at least 5 points to demo improved balance with gait.    Baseline  TBA    Time  4    Period  Weeks    Status  New    Target Date  09/06/17      PT LONG TERM GOAL #4   Title  Independent in HEP for LLE strengthening and balance.    Time  4    Period  Weeks    Status  New    Target Date  09/06/17      PT LONG TERM GOAL #5   Title  Negotiate 4 steps without rail using a step over step sequence with supervision to demo improved balance.    Time  4    Period  Weeks    Status  New    Target Date  09/06/17            Plan - 08/16/17 1519    Clinical Impression Statement  Patient with noted abdominal/core weakness when addressing LLE weakness (especially noted in quadruped and bridging). Overall did well with balance activities, however almost exclusively using ankle strategy to try to maintain balance. Patient can continue to benefit from PT to work towards goals.     Rehab Potential  Good    PT Frequency  2x / week    PT Duration  4 weeks    PT Treatment/Interventions  ADLs/Self Care Home Management;Gait training;Stair training;Therapeutic activities;Therapeutic exercise;Patient/family education;Neuromuscular re-education;Balance training    PT Next Visit Plan  cont LLE strengthening; core stabilization; needs Lt plantarflexor  strengthening in closed chain    Consulted and Agree with Plan of Care  Patient;Family member/caregiver       Patient will benefit from skilled therapeutic intervention in order to improve the following deficits and impairments:  Abnormal gait, Cardiopulmonary status limiting activity, Decreased activity tolerance, Decreased balance, Decreased cognition, Decreased endurance, Decreased coordination, Decreased strength, Impaired sensation, Impaired UE functional use, Other (comment)  Visit Diagnosis: Muscle weakness (generalized)  Unsteadiness on feet     Problem List Patient Active Problem List   Diagnosis Date Noted  . Chronic systolic heart failure (HCC) 08/07/2017  . Encounter for therapeutic drug monitoring 07/27/2017  . Muscle pain   . Supratherapeutic INR   . Leukocytosis   . Coronary artery disease involving native coronary artery of native heart without angina pectoris   . Prediabetes   . Hemorrhoids   . 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 Left hemiparesis (HCC)   . Cardiomyopathy, ischemic   . 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 Myocardiopathy (HCC)   . Mixed hyperlipidemia   . CVA (cerebral vascular accident) (HCC) 07/02/2017  . Alcohol use 07/02/2017  . Anxiety 07/02/2017  . Depression 07/02/2017  . Acute hyperglycemia 07/02/2017  . Elevated blood-pressure reading without diagnosis of hypertension 07/02/2017  . ADHD 07/02/2017  . Cocaine abuse (HCC) 07/02/2017  . Marijuana abuse 06/20/2017  . Cocaine use 06/20/2017    Zena Amos, PT 08/16/2017, 3:23 PM  [place] [place] [facility] [address] [place], [place], [address] Phone: [phone]   Fax:  [phone]  Name: Angel Davies MRN: [id number] Date of Birth: 06-30-1971

## 2017-08-17 ENCOUNTER — Other Ambulatory Visit: Payer: Self-pay

## 2017-08-17 NOTE — Patient Outreach (Signed)
[facility] Care Management  08/17/2017  Angel Davies 06/01/1971 [id number]   EMMI:stroke Referral date:08/10/17 Referral source:EMMI stroke red alert Referral reason:went to follow up appointment: NO  Telephone follow up call to patient regarding EMMI stroke follow up.  HIPAA verified with patient.  Patient states he has [facility] social worker regarding transportation assistance. Patient states social worker is going to assist him with SCAT application.   Patient denies having any new symptoms or concerns at this point. Patient states he has been able to keep his follow up doctor and rehab appointments by coordinating with his family for transportation assistance up to this point.   PLAN;  No further follow up needed by this RNCM  Per note: social worker to follow up with patient regarding transportation assistance.   George InaDavina Guillermina Shaft RN,BSN,CCM [facility] Telephonic  [phone]

## 2017-08-20 ENCOUNTER — Ambulatory Visit (INDEPENDENT_AMBULATORY_CARE_PROVIDER_SITE_OTHER): Payer: Managed Care, Other (non HMO) | Admitting: Nurse Practitioner

## 2017-08-20 ENCOUNTER — Encounter: Payer: Self-pay | Admitting: Nurse Practitioner

## 2017-08-20 VITALS — BP 116/69 | HR 83 | Wt 200.0 lb

## 2017-08-20 DIAGNOSIS — I639 Cerebral infarction, unspecified: Secondary | ICD-10-CM

## 2017-08-20 DIAGNOSIS — E782 Mixed hyperlipidemia: Secondary | ICD-10-CM

## 2017-08-20 DIAGNOSIS — G8194 Hemiplegia, unspecified affecting left nondominant side: Secondary | ICD-10-CM

## 2017-08-20 NOTE — Patient Instructions (Addendum)
Stressed the importance of management of risk factors to prevent further stroke Continue Coumadin for secondary stroke prevention Maintain strict control of hypertension with blood pressure goal below 130/90, today's reading 116/69 continue antihypertensive medications Control of diabetes with hemoglobin A1c below 6.5 followed by primary care most recent hemoglobin A1c6 Cholesterol with LDL cholesterol less than 70, followed by primary care,  To high to calculate due to elevated triglycerides  continue statin drugs Lipitor and fenofibrate Continue occupational therapy and speech therapy , then home exercise program   eat healthy diet with whole grains,  fresh fruits and vegetables congrats  on stopping smoking  Continue cessation on recreational drugs Follow-up with neuro ophthalmologist Dr. Karleen HampshireSpencer Continue follow-up with cardiology  F/U in 4 months

## 2017-08-20 NOTE — Progress Notes (Signed)
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 Patient presented via private vehicle with strokelike symptoms. Reportedly went to bed, woke up, was unable to bear weight on the left leg and at that time noticed not only left leg weakness but left-sided numbness, slurred speech and a left facial droop. Patient didn't seek any medical attention till later in the afternoon on 07/02/17. Patient admits to drinking half a box of wine and using cocaine the night before presentation. Pt also uses marijuana 4 times a week Of note, pt had been seen by neurology Dr. Lucia Gaskins on 11/19 with a chief complaint of history of strokes in the family, hx of fall, last in June, which was preceded by vision loss in the left eye peripherally and was associated with headaches. An outpatient MRI of the brain with and without contrast had been planned for 07/06/17. In the ED, CT of the head revealed small bilateral occipital infarcts. Neurology was consulted. A code stroke was called but pt was not a candidate for thrombolytic therapy UPDATE 1/21/2019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  CTA head and neck confirmed high-grade right P2 stenosis.  TTE EF 20-25%.  Unable to calculate LDL,  Triglycerides 539.  Hemoglobin A1c 6.  Urine drug screen positive for cocaine and THC.  He is currently receiving occupational therapy and therapy.  He has a follow-up appointment with Dr. neuro-ophthalmologist before he  can start back driving.  He is currently on Coumadin for secondary stroke prevention without bruising and bleeding.  He says he has stopped smoking and was congratulated for that.  He also says he is no longer using cocaine.  He is on Lipitor and fenofibrate.  He denies any myalgias.  He does complain with memory loss and weakness on the left side.  He returns for reevaluation     REVIEW OF SYSTEMS: Full 14 system review of systems performed and notable only for those listed, all others are neg:  Constitutional: neg  Cardiovascular: neg Ear/Nose/Throat: neg  Skin: neg Eyes: neg Respiratory: neg Gastroitestinal: neg  Hematology/Lymphatic: neg  Endocrine: neg Musculoskeletal:neg Allergy/Immunology: neg Neurological: Memory loss, weakness Psychiatric: Depression Sleep : neg   ALLERGIES: No Known Allergies  HOME MEDICATIONS: Outpatient Medications Prior to Visit  Medication Sig Dispense Refill  . acetaminophen (TYLENOL) 500 MG tablet Take 1 tablet (500 mg total) by mouth every 6 (six) hours as needed for headache (pain). 30 tablet 0  . atorvastatin (LIPITOR) 80 MG tablet Take 1 tablet (80 mg total) by mouth daily at 6 PM. 30 tablet 0  . carvedilol (COREG) 3.125 MG tablet Take 1 tablet (3.125 mg total) by mouth 2 (two) times daily with a meal. 60 tablet 0  . digoxin (LANOXIN) 0.125 MG tablet Take 1 tablet (0.125 mg total) by mouth daily. 30 tablet 0  . fenofibrate 160 MG tablet Take 1 tablet (160 mg total) by mouth daily. 30 tablet 0  . folic acid (FOLVITE) 1 MG tablet Take 1 tablet (1 mg total) by mouth daily. 30 tablet 0  .  Multiple Vitamin (MULTIVITAMIN WITH MINERALS) TABS tablet Take 1 tablet by mouth daily. 100 tablet 0  . nicotine (NICODERM CQ - DOSED IN MG/24 HOURS) 14 mg/24hr patch Place 1 patch (14 mg total) onto the skin daily. 28 patch 0  . sacubitril-valsartan (ENTRESTO) 24-26 MG Take 1 tablet by mouth 2 (two) times daily. 60 tablet 6  . spironolactone (ALDACTONE) 25 MG  tablet Take 0.5 tablets (12.5 mg total) by mouth at bedtime. 30 tablet 0  . thiamine 100 MG tablet Take 1 tablet (100 mg total) by mouth daily. 30 tablet 0  . traZODone (DESYREL) 50 MG tablet Take 100-150 mg by mouth at bedtime as needed for sleep.    Marland Kitchen warfarin (COUMADIN) 2 MG tablet Take one pill with supper on all days except Saturday. Take one and a half pill on Saturdays. 35 tablet 0   No facility-administered medications prior to visit.     PAST MEDICAL HISTORY: Past Medical History:  Diagnosis Date  . ADHD (attention deficit hyperactivity disorder)   . Anxiety   . CVA (cerebral vascular accident) (HCC) 07/02/2017   "left sided weakness" (07/02/2017)  . Depression   . History of kidney stones     PAST SURGICAL HISTORY: Past Surgical History:  Procedure Laterality Date  . CYSTOSCOPY WITH URETEROSCOPY, STONE BASKETRY AND STENT PLACEMENT    . RIGHT/LEFT HEART CATH AND CORONARY ANGIOGRAPHY N/A 07/05/2017   Procedure: RIGHT/LEFT HEART CATH AND CORONARY ANGIOGRAPHY;  Surgeon: Dolores Patty, MD;  [facility];  Service: Cardiovascular;  Laterality: N/A;    FAMILY HISTORY: Family History  Problem Relation Age of Onset  . Stroke Mother   . Colon cancer Mother   . Prostate cancer Father   . Seizures Neg Hx     SOCIAL HISTORY: Social History   Socioeconomic History  . Marital status: Married    Spouse name: Not on file  . Number of children: Not on file  . Years of education: Not on file  . Highest education level: Not on file  Social Needs  . Financial resource strain: Not on file  . Food insecurity - worry: Not on file  . Food insecurity - inability: Not on file  . Transportation needs - medical: Not on file  . Transportation needs - non-medical: Not on file  Occupational History  . Not on file  Tobacco Use  . Smoking status: Former Smoker    Packs/day: 0.00    Years: 0.00    Pack years: 0.00  . Smokeless tobacco: Never Used  Substance and  Sexual Activity  . Alcohol use: Yes    Alcohol/week: 2.4 oz    Types: 4 Glasses of wine per week  . Drug use: Yes    Types: Marijuana, "Crack" cocaine    Comment: 07/02/2017 "smokes marijuana a couple days/week" , "08/20/17 vapes marijuana juice"  . Sexual activity: Yes  Other Topics Concern  . Not on file  Social History Narrative  . Not on file     PHYSICAL EXAM  Vitals:   08/20/17 1432  BP: 116/69  Pulse: 83  Weight: 200 lb (90.7 kg)   Body mass index is 29.53 kg/m.  Generalized: Well developed, in no acute distress  Head: normocephalic and atraumatic,. Oropharynx benign  Neck: Supple, no carotid bruits  Cardiac: Regular rate rhythm, no murmur  Musculoskeletal: No deformity   Neurological examination   Mentation: Alert oriented to time, place, history taking. Attention span and concentration appropriate.  Follows all  commands speech and language fluent.   Cranial nerve II-XII: Pupils were equal round reactive to light extraocular movements were full, vision loss  left upper and lower quadrant, mild left facial droop, hearing  was intact to finger rubbing bilaterally. Uvula tongue midline. head turning and shoulder shrug were normal and symmetric.Tongue protrusion into cheek strength was normal. Motor: normal bulk and tone, full strength in the BUE, BLE, on the right left upper extremity 4 out of 5 lower extremity 4 out of 5, 4/5 dorsi flexion Sensory: normal and symmetric to light touch, pinprick, and  Vibration in the upper and lower extremities  Coordination: finger-nose-finger, heel-to-shin bilaterally, no dysmetria Reflexes: 3+ on the left, 2+ on the right, plantar responses were flexor bilaterally. Gait and Station: Rising up from seated position without assistance, normal stance,  moderate stride, good arm swing, smooth turning, able to perform tiptoe, and heel walking without difficulty. Tandem gait is unsteady.  No assistive device  DIAGNOSTIC DATA (LABS, IMAGING,  TESTING) - I reviewed patient records, labs, notes, testing and imaging myself where available.  Lab Results  Component Value Date   WBC 11.1 (H) 07/24/2017   HGB 14.9 07/24/2017   HCT 45.3 07/24/2017   MCV 91.5 07/24/2017   PLT 313 07/24/2017      Component Value Date/Time   NA 140 08/14/2017 1057   K 4.4 08/14/2017 1057   CL 106 08/14/2017 1057   CO2 25 08/14/2017 1057   GLUCOSE 105 (H) 08/14/2017 1057   BUN 16 08/14/2017 1057   CREATININE 0.91 08/14/2017 1057   CALCIUM 9.9 08/14/2017 1057   PROT 6.7 07/07/2017 0223   ALBUMIN 3.8 07/07/2017 0223   AST 26 07/07/2017 0223   ALT 34 07/07/2017 0223   ALKPHOS 78 07/07/2017 0223   BILITOT 0.4 07/07/2017 0223   GFRNONAA >60 08/14/2017 1057   GFRAA >60 08/14/2017 1057   Lab Results  Component Value Date   CHOL 303 (H) 07/03/2017   HDL 31 (L) 07/03/2017   LDLCALC UNABLE TO CALCULATE IF TRIGLYCERIDE OVER 400 mg/dL 56/31/4970   TRIG 263 (H) 07/03/2017   CHOLHDL 9.8 07/03/2017   Lab Results  Component Value Date   HGBA1C 6.0 (H) 07/03/2017    ASSESSMENT AND PLAN 47 year old male with history of smoker, anxiety, substance abuse admitted for left sided weakness and slurred speech.  MRI showed acute right pontine infarct, subacute right PCA infarct, and old left PCA infarct.  MRA showed right P2 stenosis and left siphon atherosclerosis.  CTA head and neck confirmed high-grade right P2 stenosis and bilateral carotid siphon atherosclerosis.  TTE showed EF 20-25%.  LDL unable to calculate due to high TG at 539, A1c 6.0.  UDS showed positive cocaine and THC.  His cardiomyopathy may likely due to substance abuse.   He is currently on Coumadin  and Lipitor 80 with fenofibrate.    He is currently receiving physical therapy and occupational therapy.  He has stopped smoking.  He also denies further cocaine abuse   PLAN: Stressed the importance of management of risk factors to prevent further stroke Continue Coumadin for secondary stroke  prevention Maintain strict control of hypertension with blood pressure goal below 130/90, today's reading 116/69 continue antihypertensive medications Control of diabetes with hemoglobin A1c below 6.5 followed by primary care most recent hemoglobin A1c6 Cholesterol with LDL cholesterol less than 70, followed by primary care,  To high to calculate due to elevated triglycerides  continue statin drugs Lipitor and fenofibrate Continue occupational therapy and  speech therapy , then home exercise program   eat healthy diet with whole grains,  fresh fruits and vegetables congrats  on stopping smoking  Continue cessation on recreational drugs Follow-up with neuro ophthalmologist Dr. Karleen HampshireSpencer Continue follow-up with cardiology F/U in 4 months Discussed risk for recurrent stroke/ TIA and answered additional questions This was a  visit requiring 40 minutes and medical decision making of high complexity with extensive review of history, hospital chart, counseling and answering questions for pt and brother  Nilda RiggsNancy Carolyn Jazzlyn Huizenga, [facility] Medical CenterGNP, [facility] HospitalBC, APRN  [facility] [address], [address] [place], [place] [address] [phone]

## 2017-08-21 ENCOUNTER — Ambulatory Visit: Payer: Managed Care, Other (non HMO) | Admitting: Physical Therapy

## 2017-08-21 ENCOUNTER — Encounter: Payer: Self-pay | Admitting: Occupational Therapy

## 2017-08-21 ENCOUNTER — Other Ambulatory Visit: Payer: Self-pay

## 2017-08-21 ENCOUNTER — Other Ambulatory Visit: Payer: Self-pay | Admitting: *Deleted

## 2017-08-21 ENCOUNTER — Ambulatory Visit: Payer: Managed Care, Other (non HMO) | Admitting: Occupational Therapy

## 2017-08-21 DIAGNOSIS — I69354 Hemiplegia and hemiparesis following cerebral infarction affecting left non-dominant side: Secondary | ICD-10-CM

## 2017-08-21 DIAGNOSIS — R278 Other lack of coordination: Secondary | ICD-10-CM

## 2017-08-21 DIAGNOSIS — R293 Abnormal posture: Secondary | ICD-10-CM

## 2017-08-21 DIAGNOSIS — I69352 Hemiplegia and hemiparesis following cerebral infarction affecting left dominant side: Secondary | ICD-10-CM | POA: Diagnosis not present

## 2017-08-21 DIAGNOSIS — R2689 Other abnormalities of gait and mobility: Secondary | ICD-10-CM

## 2017-08-21 DIAGNOSIS — I69118 Other symptoms and signs involving cognitive functions following nontraumatic intracerebral hemorrhage: Secondary | ICD-10-CM

## 2017-08-21 DIAGNOSIS — R2681 Unsteadiness on feet: Secondary | ICD-10-CM

## 2017-08-21 DIAGNOSIS — M6281 Muscle weakness (generalized): Secondary | ICD-10-CM

## 2017-08-21 NOTE — Patient Outreach (Signed)
[facility]) Care Management  08/21/2017  Angel CooperJames D Davies 02/13/1971 [id number]   Care coordination: Update received from social worker, Angel PertJoanna Davies.  Social work services have been completed.   PLAN; RNCM will refer case to care management assistant to close due to patient having no further needs.   George [name] RN,BSN,CCM [facility] Telephonic  [phone]

## 2017-08-21 NOTE — Patient Instructions (Signed)
Add to home program for your arm:  MAKE SURE YOU BREATHE when you exercise.  Do 1-2 times per day. Make sure you rest between sets.  Focus on form.   1. Lay on your stomach with your left arm hanging over the edge of the bed.  Keep arm straight with palm facing in toward your body. Hold a water bottle in your hand.  KEEPING ELBOW STRAIGHT, reach toward your feet (I.e. Drop your shoulder) and then raise your hand up past your hip.  Do 12, rest then do 12 more.    2.  Lay on your stomach with your left arm out to your side and elbow bent at 90*.  Your palm should be facing your feet and and your elbow should be level with your shoulder.  Drop your shoulders and THEN lift your elbow up toward the ceiling, keeping your elbow bent at 90*.  You should be pushing your shoulder blades down and then squeezing them together.  Do 12, rest then do 12 more.   

## 2017-08-21 NOTE — Patient Outreach (Signed)
[facility]) Care Management  08/21/2017  Angel Davies 09-23-1970 [id number]   CSW was able to make contact with patient today to follow-up regarding social work services and resources, as well as to ensure that patient received the packet of resource information mailed to his home by CSW.  Patient received the packet but admitted that he has not done anything with it, as of yet.  CSW inquired as to whether or not patient wanted CSW to go through the packet of resources with him, but patient denied.  CSW also asked patient if he needed assistance with completion of the [employer]AT Paramedic) application, but again, patient denied.  CSW was able to ensure that patient has the correct contact information for CSW, encouraging patient to contact CSW directly if additional social work needs arise in the near future.  Patient voiced understanding and was agreeable to this plan. CSW will perform a case closure on patient, as all goals of treatment have been met from social work standpoint and no additional social work needs have been identified at this time.  CSW will notify patient's Telephonic RNCM with [facility], Quinn Plowman of CSW's plans to close patient's case.  CSW will fax an update to patient's Primary Care Physician, Dr. Lennette Bihari Via to ensure that they are aware of CSW's involvement with patient's plan of care.  CSW will submit a case closure request to Alycia Rossetti, Care Management Assistant with [facility], in the form of an [employer].  CSW will ensure that Mrs. Arelia Sneddon is aware of Mrs. Green's, RNCM with [facility], continued involvement with patient's care.  [facility] CM Care Plan Problem One     Most Recent Value  Care Plan Problem One  Lack of transportation to and from physician appointments.  Role Documenting the Problem One  Clinical Social Worker  Care  Plan for Problem One  Active  [facility] Long Term Goal   Patient will have secure transportation services in place, within the next 45 days.  [facility] Long Term Goal Start Date  08/13/17  [facility] Long Term Goal Met Date  08/21/17  Interventions for Problem One Long Term Goal  CSW will assist patient with arranging secure transportation services to and from his physician appointments.  [facility] CM Short Term Goal #1   Patient will utilize list of transportation resources to get to and from physician appointments, within the next three weeks.  [facility] CM Short Term Goal #1 Start Date  08/13/17  [facility] CM Short Term Goal #1 Met Date  08/21/17  Interventions for Short Term Goal #1  List of transportation resources has been mailed to patient's home for his review and assistance.  [facility] CM Short Term Goal #2   Patient will consider using public transportation services through [employer], within the next three weeks.  [facility] CM Short Term Goal #2 Start Date  08/13/17  [facility] CM Short Term Goal #2 Met Date  08/21/17  Interventions for Short Term Goal #2  CSW has mailed patient a SCAT application, as well as completed and submitted an application on his behalf to [facility] for processing.    Nat Christen, BSW, MSW, LCSW  Licensed Education officer, [facility]  [address] [place], [place] [address] Physical Address-[address], [place], [place] [address] Toll Free Main # [phone] Fax # [phone] Cell # [phone]  Office #  [email or web address]

## 2017-08-21 NOTE — Therapy (Signed)
[facility] [address] [place], [place], [address] Phone: [phone]   Fax:  [phone]  Occupational Therapy Treatment  Patient Details  Name: Angel Davies MRN: [id number] Date of Birth: 1970/08/15 Referring Provider: Dr. Claudette Laws   Encounter Date: 08/21/2017  OT End of Session - 08/21/17 1244    Visit Number  4    Number of Visits  16    Date for OT Re-Evaluation  10/01/17    Authorization Type  [employer] Managed - no auth await info on any visit limitations    OT Start Time  1146    OT Stop Time  1231    OT Time Calculation (min)  45 min    Activity Tolerance  Patient tolerated treatment well       Past Medical History:  Diagnosis Date  . ADHD (attention deficit hyperactivity disorder)   . Anxiety   . CVA (cerebral vascular accident) (HCC) 07/02/2017   "left sided weakness" (07/02/2017)  . Depression   . History of kidney stones     Past Surgical History:  Procedure Laterality Date  . CYSTOSCOPY WITH URETEROSCOPY, STONE BASKETRY AND STENT PLACEMENT    . RIGHT/LEFT HEART CATH AND CORONARY ANGIOGRAPHY N/A 07/05/2017   Procedure: RIGHT/LEFT HEART CATH AND CORONARY ANGIOGRAPHY;  Surgeon: Dolores Patty, MD;  [facility]: [facility];  Service: Cardiovascular;  Laterality: N/A;    There were no vitals filed for this visit.  Subjective Assessment - 08/21/17 1150    Pertinent History  Pt with R pontine CVA, ADHD, anxiety, depression, poly substance abuse, severe stenosis posterior territory, severe CAD, subacute termporoccipital CVA    Currently in Pain?  No/denies                   OT Treatments/Exercises (OP) - 08/21/17 0001      Neurological Re-education Exercises   Other Exercises 1  Neuro re ed to address trunk control, scapular alignment, posterior shoulder girdle activity, stabilizatio and stable mobility in supine, sitting and standing.  Pt with improvement in alignment  and ability to engage and activate appropriate muscles for activity.  Upgrade HEP - see pt section for details.              OT Education - 08/21/17 1242    Education provided  Yes    Education Details  upgraded HEP for arm    Person(s) Educated  Patient    Methods  Explanation;Demonstration;Verbal cues;Handout;Other (comment) videotape on phone   videotape on phone   Comprehension  Verbalized understanding;Returned demonstration       OT Short Term Goals - 08/21/17 1243      OT SHORT TERM GOAL #1   Title  Pt will be mod I with HEP for LUE proximal strengthening, grip strength and coordination - 09/03/2017    Status  On-going      OT SHORT TERM GOAL #2   Title  Pt will demonstrate improved grip strength by at least 8 pounds to assist with opening containers - baseline = 45    Status  On-going      OT SHORT TERM GOAL #3   Title  Pt will demonstrate improved coordination as evidenced by decreasing time on 9 hole peg by at least 10 seconds to assist with tying shoes, other fine motor tasks.    Baseline  53.38    Status  On-going      OT SHORT TERM GOAL #4  Title  Pt will be able to pick up 5 pound object and place in overhead cabinet x5 without dropping and with postural alignment     Status  On-going      OT SHORT TERM GOAL #5   Title  Pt will tolerate at least 20 minutes of ambulatory or standing activity without rest break    Status  On-going        OT Long Term Goals - 08/21/17 1243      OT LONG TERM GOAL #1   Title  Pt will be mod I with upgraded HEP prn - 10/01/3016    Status  On-going      OT LONG TERM GOAL #2   Title  Pt will demonstrate improve grip strength by at least 12 pounds to assist with using LUE as non dominant in house mgmt tasks (baseline = 45)    Status  On-going      OT LONG TERM GOAL #3   Title  Pt will demonstrate improved coordination as evidenced by decreasing time on 9 hole peg by at least 15 seconds to assist with fine motor tasks  (baseline= 53.38)    Status  On-going      OT LONG TERM GOAL #4   Title  Pt will tolerate at least 35 minutes of ambulatory or standing activity without rest breaks.     Status  On-going      OT LONG TERM GOAL #5   Title  Pt will demonstrate ability to lift 8 pound object into overhead cabinet x4 without dropping and with good postural alignment    Status  On-going      OT LONG TERM GOAL #6   Title  Pt will be mod I with putting and driving of golf ball on outdoor, uneven surfaces    Status  On-going            Plan - 08/21/17 1243    Clinical Impression Statement  Pt is progressing toward goals. Pt very motivated.      Rehab Potential  Good    OT Frequency  2x / week    OT Duration  8 weeks    OT Treatment/Interventions  Self-care/ADL training;Aquatic Therapy;Moist Heat;Therapeutic exercise;Neuromuscular education;Functional Mobility Training;DME and/or AE instruction;Manual Therapy;Cognitive remediation/compensation;Therapeutic activities;Patient/family education;Balance training    Plan  Check HEP, give handout, NMR for LUE proximal control (scapular aligment and stability)/trunk, balance, activity tolerance, resistive overhead reach    Consulted and Agree with Plan of Care  Patient       Patient will benefit from skilled therapeutic intervention in order to improve the following deficits and impairments:  Abnormal gait, Decreased activity tolerance, Decreased balance, Decreased coordination, Decreased cognition, Decreased mobility, Difficulty walking, Decreased strength, Impaired UE functional use, Impaired sensation  Visit Diagnosis: Hemiplegia and hemiparesis following cerebral infarction affecting left non-dominant side (HCC)  Muscle weakness (generalized)  Other lack of coordination  Unsteadiness on feet  Abnormal posture  Other symptoms and signs involving cognitive functions following nontraumatic intracerebral hemorrhage    Problem List Patient Active  Problem List   Diagnosis Date Noted  . Chronic systolic heart failure (HCC) 08/07/2017  . Encounter for therapeutic drug monitoring 07/27/2017  . Muscle pain   . Supratherapeutic INR   . Leukocytosis   . Coronary artery disease involving native coronary artery of native heart without angina pectoris   . Prediabetes   . Hemorrhoids   . 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 Left hemiparesis (HCC)   .  Cardiomyopathy, ischemic   . 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 Myocardiopathy (HCC)   . Mixed hyperlipidemia   . CVA (cerebral vascular accident) (HCC) 07/02/2017  . Alcohol use 07/02/2017  . Anxiety 07/02/2017  . Depression 07/02/2017  . Acute hyperglycemia 07/02/2017  . Elevated blood-pressure reading without diagnosis of hypertension 07/02/2017  . ADHD 07/02/2017  . Cocaine abuse (HCC) 07/02/2017  . Marijuana abuse 06/20/2017  . Cocaine use 06/20/2017    Norton PastelPulaski, Biddie Sebek Halliday, OTR/L 08/21/2017, 12:45 PM  [facility] [address] [place], [place], [address] Phone: [phone]   Fax:  [phone]  Name: Claretha CooperJames D Louischarles MRN: [id number] Date of Birth: 06/13/1971

## 2017-08-22 ENCOUNTER — Ambulatory Visit (INDEPENDENT_AMBULATORY_CARE_PROVIDER_SITE_OTHER): Payer: Managed Care, Other (non HMO) | Admitting: *Deleted

## 2017-08-22 ENCOUNTER — Encounter: Payer: Self-pay | Admitting: Physical Therapy

## 2017-08-22 DIAGNOSIS — I236 Thrombosis of atrium, auricular appendage, and ventricle as current complications following acute myocardial infarction: Secondary | ICD-10-CM

## 2017-08-22 DIAGNOSIS — Z5181 Encounter for therapeutic drug level monitoring: Secondary | ICD-10-CM

## 2017-08-22 DIAGNOSIS — I6350 Cerebral infarction due to unspecified occlusion or stenosis of unspecified cerebral artery: Secondary | ICD-10-CM

## 2017-08-22 DIAGNOSIS — I255 Ischemic cardiomyopathy: Secondary | ICD-10-CM | POA: Diagnosis not present

## 2017-08-22 LAB — POCT INR: INR: 2.9

## 2017-08-22 NOTE — Progress Notes (Signed)
Treatment and Self Management Plan Request form completed and successfully faxed to Era BumpersSharon McCalla-Smith , [employer] [phone]

## 2017-08-22 NOTE — Therapy (Signed)
[facility] [address] [place], [place], [address] Phone: [phone]   Fax:  [phone]  Physical Therapy Treatment  Patient Details  Name: Angel Davies MRN: [id number] Date of Birth: 04/29/1971 Referring Provider: Dr. Claudette LawsAndrew Kirsteins   Encounter Date: 08/21/2017  PT End of Session - 08/22/17 1457    Visit Number  4    Number of Visits  9    Date for PT Re-Evaluation  09/06/17    Authorization Type  [employer] - await info on visit limit    PT Start Time  1103    PT Stop Time  1146    PT Time Calculation (min)  43 min       Past Medical History:  Diagnosis Date  . ADHD (attention deficit hyperactivity disorder)   . Anxiety   . CVA (cerebral vascular accident) (HCC) 07/02/2017   "left sided weakness" (07/02/2017)  . Depression   . History of kidney stones     Past Surgical History:  Procedure Laterality Date  . CYSTOSCOPY WITH URETEROSCOPY, STONE BASKETRY AND STENT PLACEMENT    . RIGHT/LEFT HEART CATH AND CORONARY ANGIOGRAPHY N/A 07/05/2017   Procedure: RIGHT/LEFT HEART CATH AND CORONARY ANGIOGRAPHY;  Surgeon: Dolores PattyBensimhon, Daniel R, MD;  [facility];  Service: Cardiovascular;  Laterality: N/A;    There were no vitals filed for this visit.  Subjective Assessment - 08/22/17 1450    Subjective  Pt reports no problems since last visit - reports doing exercises at home    Pertinent History  Rt pontine CVA on 07-02-17: alcohol use; cocaine abuse:  anxiety/depression:  myocardiopathy;  acute systolic heart failure; s/p Rt/Lt heart cath & coronary angiography (07-05-17)    Patient Stated Goals  improve balance and gait; increase endurance    Currently in Pain?  No/denies                      [facility] - 08/22/17 0001      Ambulation/Gait   Ambulation/Gait  Yes    Ambulation/Gait Assistance  5: Supervision    Ambulation/Gait Assistance Details  mirror used for  visual feedback - gait deviations are decreasing     Ambulation Distance (Feet)  350 Feet    Assistive device  None    Gait Pattern  Step-through pattern    Ambulation Surface  Level;Indoor      Lumbar Exercises: Quadruped   Straight Leg Raise  5 reps LLE    Opposite Arm/Leg Raise  Right arm/Left leg;Left arm/Right leg;Other (comment) 3 reps each side    Other Quadruped Lumbar Exercises  Lt knee flexion/extension in quadruped position;  Lt hip exteniosn with knee flexed (small pulses toward ceiling)       Knee/Hip Exercises: Machines for Strengthening   Cybex Leg Press  bil. LE's 60# 15 reps;  LLE only 30# 20 reps;  Lt plantarflexion on leg press 40# 10 reps        Knee/Hip Exercises: Prone   Hamstring Curl  1 set;10 reps 4# used for first 6 reps, then removed weight due to fatigue    Other Prone Exercises  Lt hip extension with knee flexed at 90 and foot dorsfiexed  10 reps      Pt transferred from tall kneeling to Lt 1/2 kneeling with CGA to min assist to maintain balance in this position: pt performed  Horizontal head turns x 5 reps in Lt 1/2 kneeling - mod  to min assist needed to maintain balance  Pt performed amb. On tiptoes with knees flexed inside // bars 2 reps - forward and backward - no UE support needed  Amb. Sideways 2 reps inside // bars on tiptoes with knees flexed for LLE strengthening   TherAct.:    Pt performed jumping 5 reps;  Lt single limb hopping x 3 reps; 1/2 jumping jacks x 5 reps inside bars (no UE support used)  Jogging in place inside // bars; pt then jogged 35' x 4 reps with break after each 35' distance            PT Long Term Goals - 08/07/17 2030      PT LONG TERM GOAL #1   Title  Increase gait velocity from 3.78 ft/sec to >/= 4.1 ft/sec for incr. gait efficiency.    Baseline  8.68 secs = 3.78 ft/sec on 08-06-17    Time  4    Period  Weeks    Status  New    Target Date  09/06/17      PT LONG TERM GOAL #2   Title  Pt will amb. for 10"  nonstop with minimal c/o fatigue, i.e. </= 2/10 intensity of fatigue.    Time  4    Period  Weeks    Status  New    Target Date  09/06/17      PT LONG TERM GOAL #3   Title  Pt will increase FGA score by at least 5 points to demo improved balance with gait.    Baseline  TBA    Time  4    Period  Weeks    Status  New    Target Date  09/06/17      PT LONG TERM GOAL #4   Title  Independent in HEP for LLE strengthening and balance.    Time  4    Period  Weeks    Status  New    Target Date  09/06/17      PT LONG TERM GOAL #5   Title  Negotiate 4 steps without rail using a step over step sequence with supervision to demo improved balance.    Time  4    Period  Weeks    Status  New    Target Date  09/06/17            Plan - 08/22/17 1500    Clinical Impression Statement  Pt progressing well towards goals - gait deviations are decreasing with pt focused on gait pattern with initial heel contact and then push off in stance;  pt continues to fatigue easily and requires short seated rest breaks.  Pt continues to exhibit LLE weakness, primarily hamstring and plantarflexor weakness but strength is improving.     Rehab Potential  Good    PT Frequency  2x / week    PT Duration  4 weeks    PT Treatment/Interventions  ADLs/Self Care Home Management;Gait training;Stair training;Therapeutic activities;Therapeutic exercise;Patient/family education;Neuromuscular re-education;Balance training    PT Next Visit Plan  cont LLE strengthening; core stabilization; needs Lt plantarflexor strengthening in closed chain - cont toward LTG's    PT Home Exercise Plan  LLE strengthening - hip ext., hamstrings, PF and DF LLE    Consulted and Agree with Plan of Care  Patient       Patient will benefit from skilled therapeutic intervention in order to improve the following deficits and impairments:  Abnormal gait, Cardiopulmonary status limiting  activity, Decreased activity tolerance, Decreased balance,  Decreased cognition, Decreased endurance, Decreased coordination, Decreased strength, Impaired sensation, Impaired UE functional use, Other (comment)  Visit Diagnosis: Muscle weakness (generalized)  Other lack of coordination  Other abnormalities of gait and mobility     Problem List Patient Active Problem List   Diagnosis Date Noted  . Chronic systolic heart failure (HCC) 08/07/2017  . Encounter for therapeutic drug monitoring 07/27/2017  . Muscle pain   . Supratherapeutic INR   . Leukocytosis   . Coronary artery disease involving native coronary artery of native heart without angina pectoris   . Prediabetes   . Hemorrhoids   . 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 Left hemiparesis (HCC)   . Cardiomyopathy, ischemic   . 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 Myocardiopathy (HCC)   . Mixed hyperlipidemia   . CVA (cerebral vascular accident) (HCC) 07/02/2017  . Alcohol use 07/02/2017  . Anxiety 07/02/2017  . Depression 07/02/2017  . Acute hyperglycemia 07/02/2017  . Elevated blood-pressure reading without diagnosis of hypertension 07/02/2017  . ADHD 07/02/2017  . Cocaine abuse (HCC) 07/02/2017  . Marijuana abuse 06/20/2017  . Cocaine use 06/20/2017    DildayDonavan Burnet, PT 08/22/2017, 3:06 PM  [facility] [address] [place], [place], [address] Phone: [phone]   Fax:  [phone]  Name: Angel Davies MRN: [id number] Date of Birth: 1971-02-05

## 2017-08-22 NOTE — Patient Instructions (Signed)
Description   Continue same dose of coumadin 2mg  (1 tablet) daily except Coumadin 3mg  (1.5 tablets) on Saturdays.   Recheck INR in 1 week.

## 2017-08-23 ENCOUNTER — Ambulatory Visit: Payer: Managed Care, Other (non HMO) | Admitting: Physical Therapy

## 2017-08-23 ENCOUNTER — Encounter: Payer: Self-pay | Admitting: Physical Therapy

## 2017-08-23 ENCOUNTER — Ambulatory Visit: Payer: Managed Care, Other (non HMO) | Admitting: Occupational Therapy

## 2017-08-23 DIAGNOSIS — M6281 Muscle weakness (generalized): Secondary | ICD-10-CM

## 2017-08-23 DIAGNOSIS — I69354 Hemiplegia and hemiparesis following cerebral infarction affecting left non-dominant side: Secondary | ICD-10-CM

## 2017-08-23 DIAGNOSIS — R278 Other lack of coordination: Secondary | ICD-10-CM

## 2017-08-23 DIAGNOSIS — I69352 Hemiplegia and hemiparesis following cerebral infarction affecting left dominant side: Secondary | ICD-10-CM | POA: Diagnosis not present

## 2017-08-23 DIAGNOSIS — R2689 Other abnormalities of gait and mobility: Secondary | ICD-10-CM

## 2017-08-23 NOTE — Patient Instructions (Signed)
Add to home program for your arm:  MAKE SURE YOU BREATHE when you exercise.  Do 1-2 times per day. Make sure you rest between sets.  Focus on form.   1. Lay on your stomach with your left arm hanging over the edge of the bed.  Keep arm straight with palm facing in toward your body. Hold a water bottle in your hand.  KEEPING ELBOW STRAIGHT, reach toward your feet (I.e. Drop your shoulder) and then raise your hand up past your hip.  Do 12, rest then do 12 more.    2.  Lay on your stomach with your left arm out to your side and elbow bent at 90*.  Your palm should be facing your feet and and your elbow should be level with your shoulder.  Drop your shoulders and THEN lift your elbow up toward the ceiling, keeping your elbow bent at 90*.  You should be pushing your shoulder blades down and then squeezing them together.  Do 12, rest then do 12 more.

## 2017-08-23 NOTE — Therapy (Signed)
[facility] [address] [place], [place], [address] Phone: [phone]   Fax:  [phone]  Occupational Therapy Treatment  Patient Details  Name: Angel Davies MRN: [id number] Date of Birth: 11/02/1970 Referring Provider: Dr. Claudette LawsAndrew Kirsteins   Encounter Date: 08/23/2017  OT End of Session - 08/23/17 1632    Visit Number  5    Number of Visits  16    Date for OT Re-Evaluation  10/01/17    Authorization Type  [employer] Managed - no auth await info on any visit limitations    OT Start Time  1538    OT Stop Time  1617    OT Time Calculation (min)  39 min    Activity Tolerance  Patient tolerated treatment well    Behavior During Therapy  [facility] for tasks assessed/performed       Past Medical History:  Diagnosis Date  . ADHD (attention deficit hyperactivity disorder)   . Anxiety   . CVA (cerebral vascular accident) (HCC) 07/02/2017   "left sided weakness" (07/02/2017)  . Depression   . History of kidney stones     Past Surgical History:  Procedure Laterality Date  . CYSTOSCOPY WITH URETEROSCOPY, STONE BASKETRY AND STENT PLACEMENT    . RIGHT/LEFT HEART CATH AND CORONARY ANGIOGRAPHY N/A 07/05/2017   Procedure: RIGHT/LEFT HEART CATH AND CORONARY ANGIOGRAPHY;  Surgeon: Dolores PattyBensimhon, Daniel R, MD;  [facility];  Service: Cardiovascular;  Laterality: N/A;    There were no vitals filed for this visit.  Subjective Assessment - 08/23/17 1630    Subjective   Denies pain    Pertinent History  Pt with R pontine CVA, ADHD, anxiety, depression, poly substance abuse, severe stenosis posterior territory, severe CAD, subacute termporoccipital CVA    Currently in Pain?  No/denies              Treatment: Seated closed chain midrange shoulder flexion x 15, followed by closed chain shoulder flexion with gentle resistance provided. Rolling ball up vertical surface with LUE x 5 reps, min-mod facilitation Pt  practice putting a golf ball with moderate accuracy, no LOB as pt reports interest in returning to golf in the future.  Mid-high range reaching to place small pegs in vertical pegboard with LUE, mod difficulty/ drops, min facilitation for positioning and maintaining weightbearing through LLE.            OT Education - 08/23/17 1630    Education provided  Yes    Education Details  reviewed HEP for arm issued last visit, issued handout    Person(s) Educated  Patient    Methods  Explanation;Demonstration    Comprehension  Verbalized understanding;Returned demonstration       OT Short Term Goals - 08/21/17 1243      OT SHORT TERM GOAL #1   Title  Pt will be mod I with HEP for LUE proximal strengthening, grip strength and coordination - 09/03/2017    Status  On-going      OT SHORT TERM GOAL #2   Title  Pt will demonstrate improved grip strength by at least 8 pounds to assist with opening containers - baseline = 45    Status  On-going      OT SHORT TERM GOAL #3   Title  Pt will demonstrate improved coordination as evidenced by decreasing time on 9 hole peg by at least 10 seconds to assist with tying shoes, other fine motor tasks.    Baseline  53.38    Status  On-going      OT SHORT TERM GOAL #4   Title  Pt will be able to pick up 5 pound object and place in overhead cabinet x5 without dropping and with postural alignment     Status  On-going      OT SHORT TERM GOAL #5   Title  Pt will tolerate at least 20 minutes of ambulatory or standing activity without rest break    Status  On-going        OT Long Term Goals - 08/21/17 1243      OT LONG TERM GOAL #1   Title  Pt will be mod I with upgraded HEP prn - 10/01/3016    Status  On-going      OT LONG TERM GOAL #2   Title  Pt will demonstrate improve grip strength by at least 12 pounds to assist with using LUE as non dominant in house mgmt tasks (baseline = 45)    Status  On-going      OT LONG TERM GOAL #3   Title  Pt will  demonstrate improved coordination as evidenced by decreasing time on 9 hole peg by at least 15 seconds to assist with fine motor tasks (baseline= 53.38)    Status  On-going      OT LONG TERM GOAL #4   Title  Pt will tolerate at least 35 minutes of ambulatory or standing activity without rest breaks.     Status  On-going      OT LONG TERM GOAL #5   Title  Pt will demonstrate ability to lift 8 pound object into overhead cabinet x4 without dropping and with good postural alignment    Status  On-going      OT LONG TERM GOAL #6   Title  Pt will be mod I with putting and driving of golf ball on outdoor, uneven surfaces    Status  On-going            Plan - 08/23/17 1632    Clinical Impression Statement  Pt is progressing toward goals. Pt returned demonstration of updated HEP.    Rehab Potential  Good    OT Frequency  2x / week    OT Duration  8 weeks    OT Treatment/Interventions  Self-care/ADL training;Aquatic Therapy;Moist Heat;Therapeutic exercise;Neuromuscular education;Functional Mobility Training;DME and/or AE instruction;Manual Therapy;Cognitive remediation/compensation;Therapeutic activities;Patient/family education;Balance training    Plan  NMR for LUE proximal control (scapular aligment and stability)/trunk, balance, activity tolerance, resistive overhead reach    Consulted and Agree with Plan of Care  Patient       Patient will benefit from skilled therapeutic intervention in order to improve the following deficits and impairments:  Abnormal gait, Decreased activity tolerance, Decreased balance, Decreased coordination, Decreased cognition, Decreased mobility, Difficulty walking, Decreased strength, Impaired UE functional use, Impaired sensation  Visit Diagnosis: Hemiplegia and hemiparesis following cerebral infarction affecting left non-dominant side (HCC)  Muscle weakness (generalized)  Other lack of coordination    Problem List Patient Active Problem List    Diagnosis Date Noted  . Chronic systolic heart failure (HCC) 08/07/2017  . Encounter for therapeutic drug monitoring 07/27/2017  . Muscle pain   . Supratherapeutic INR   . Leukocytosis   . Coronary artery disease involving native coronary artery of native heart without angina pectoris   . Prediabetes   . Hemorrhoids   . 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 Left hemiparesis (HCC)   .  Cardiomyopathy, ischemic   . 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 Myocardiopathy (HCC)   . Mixed hyperlipidemia   . CVA (cerebral vascular accident) (HCC) 07/02/2017  . Alcohol use 07/02/2017  . Anxiety 07/02/2017  . Depression 07/02/2017  . Acute hyperglycemia 07/02/2017  . Elevated blood-pressure reading without diagnosis of hypertension 07/02/2017  . ADHD 07/02/2017  . Cocaine abuse (HCC) 07/02/2017  . Marijuana abuse 06/20/2017  . Cocaine use 06/20/2017    Kinze Labo 08/23/2017, 4:33 PM  [facility] [address] [place], [place], [address] Phone: [phone]   Fax:  [phone]  Name: Angel Davies MRN: [id number] Date of Birth: 08-31-1970

## 2017-08-24 ENCOUNTER — Other Ambulatory Visit: Payer: Self-pay | Admitting: Physical Medicine and Rehabilitation

## 2017-08-24 NOTE — Therapy (Signed)
[facility] [address] [place], [place], [address] Phone: [phone]   Fax:  [phone]  Physical Therapy Treatment  Patient Details  Name: Angel Davies MRN: [id number] Date of Birth: [birth year] Referring Provider: Dr. Claudette Laws   Encounter Date: 08/23/2017  PT End of Session - 08/23/17 1547    Visit Number  5    Number of Visits  9    Date for PT Re-Evaluation  09/06/17    Authorization Type  [employer] - await info on visit limit    PT Start Time  1446    PT Stop Time  1527    PT Time Calculation (min)  41 min    Activity Tolerance  Patient tolerated treatment well    Behavior During Therapy  [facility] for tasks assessed/performed       Past Medical History:  Diagnosis Date  . ADHD (attention deficit hyperactivity disorder)   . Anxiety   . CVA (cerebral vascular accident) (HCC) 07/02/2017   "left sided weakness" (07/02/2017)  . Depression   . History of kidney stones     Past Surgical History:  Procedure Laterality Date  . CYSTOSCOPY WITH URETEROSCOPY, STONE BASKETRY AND STENT PLACEMENT    . RIGHT/LEFT HEART CATH AND CORONARY ANGIOGRAPHY N/A 07/05/2017   Procedure: RIGHT/LEFT HEART CATH AND CORONARY ANGIOGRAPHY;  Surgeon: Dolores Patty, MD;  [facility];  Service: Cardiovascular;  Laterality: N/A;    There were no vitals filed for this visit.  Subjective Assessment - 08/23/17 1449    Subjective  No falls or changes. Jumped for the first time and did not fall down. Reports he has been working on walking program doing laps inside his home (does not want to venture out into neighborhood due to memory deficits). Has developed a routinue (in kitchen uses tiles to measure his step length, then walks towards a mirror and checks symmetry of shoulders, in living room walks around and steps over objects). Reports he usually does 10 laps 7 times per day. Does not monitor his HR while walking.      Pertinent History  Rt pontine CVA on 07-02-17: alcohol use; cocaine abuse:  anxiety/depression:  myocardiopathy;  acute systolic heart failure; s/p Rt/Lt heart cath & coronary angiography (07-05-17)    Patient Stated Goals  improve balance and gait; increase endurance    Currently in Pain?  No/denies                      [facility] Adult PT Treatment/Exercise - 08/23/17 1536      Ambulation/Gait   Ambulation/Gait  Yes    Ambulation/Gait Assistance  6: Modified independent (Device/Increase time)    Ambulation/Gait Assistance Details  over ground, indoor, outdoor, treadmill with vc for longer step length on the left; progressed to jogging x 2 (~40 ft); skipping x 10 ft x 3 (pt with difficulty coordinating)    Ambulation Distance (Feet)  1000 Feet at least, total distance (not including treadmil)    Assistive device  None    Gait Pattern  Step-through pattern;Decreased step length - left initially antalgic with rt foot pain; adjusted orthotic i    Gait velocity  able to vary up/down smoothly    Stairs  Yes    Stairs Assistance  5: Supervision    Stairs Assistance Details (indicate cue type and reason)  initially impulsive/moving too quickly with loss of balance and grabbing rail; with repetition and cues to slow  down, progressed to smooth/coordinated up/down no rail    Stair Management Technique  No rails;Alternating pattern    Number of Stairs  24    Height of Stairs  6    Gait Comments  monitored HR pre and post stair training with HR 78 up to 102       Knee/Hip Exercises: Machines for Strengthening   Cybex Leg Press  LLE only 40# x 10; 50# x 10; emphasis on slow contolled movement and preventing knee hyperextension      Knee/Hip Exercises: Plyometrics   Bilateral Jumping  1 set;5 reps    Other Plyometric Exercises  jump legs out and together (1/2 jumping jack) x 5 reps with good coordination      Knee/Hip Exercises: Standing   Heel Raises  Left;20 reps;2 sets left foot on  bottom step to move from maximum DF to PF                  PT Long Term Goals - 08/07/17 2030      PT LONG TERM GOAL #1   Title  Increase gait velocity from 3.78 ft/sec to >/= 4.1 ft/sec for incr. gait efficiency.    Baseline  8.68 secs = 3.78 ft/sec on 08-06-17    Time  4    Period  Weeks    Status  New    Target Date  09/06/17      PT LONG TERM GOAL #2   Title  Pt will amb. for 10" nonstop with minimal c/o fatigue, i.e. </= 2/10 intensity of fatigue.    Time  4    Period  Weeks    Status  New    Target Date  09/06/17      PT LONG TERM GOAL #3   Title  Pt will increase FGA score by at least 5 points to demo improved balance with gait.    Baseline  TBA    Time  4    Period  Weeks    Status  New    Target Date  09/06/17      PT LONG TERM GOAL #4   Title  Independent in HEP for LLE strengthening and balance.    Time  4    Period  Weeks    Status  New    Target Date  09/06/17      PT LONG TERM GOAL #5   Title  Negotiate 4 steps without rail using a step over step sequence with supervision to demo improved balance.    Time  4    Period  Weeks    Status  New    Target Date  09/06/17            Plan - 08/24/17 0603    Clinical Impression Statement  Session focused on gait and higher level gait activities (including short distance jogging & skipping) while monitoring HR for tolerance. LLE strength and knee control improving. Able to make adjustment to pt's shoes (removed foot bed liner and then placed pt's full length orthotics in his shoe with relief of pain in mid-arch of rt foot, eliminating antalgic gait. Patient can benefit from continued PT    Clinical Presentation  Stable    Rehab Potential  Good    PT Frequency  2x / week    PT Duration  4 weeks    PT Treatment/Interventions  ADLs/Self Care Home Management;Gait training;Stair training;Therapeutic activities;Therapeutic exercise;Patient/family education;Neuromuscular re-education;Balance training     PT  Next Visit Plan  cont LLE strengthening; core stabilization; needs Lt plantarflexor strengthening in closed chain - cont toward LTG's; jumping, running    PT Home Exercise Plan  LLE strengthening - hip ext., hamstrings, PF and DF LLE    Consulted and Agree with Plan of Care  Patient       Patient will benefit from skilled therapeutic intervention in order to improve the following deficits and impairments:  Abnormal gait, Cardiopulmonary status limiting activity, Decreased activity tolerance, Decreased balance, Decreased cognition, Decreased endurance, Decreased coordination, Decreased strength, Impaired sensation, Impaired UE functional use, Other (comment)  Visit Diagnosis: Muscle weakness (generalized)  Other abnormalities of gait and mobility     Problem List Patient Active Problem List   Diagnosis Date Noted  . Chronic systolic heart failure (HCC) 08/07/2017  . Encounter for therapeutic drug monitoring 07/27/2017  . Muscle pain   . Supratherapeutic INR   . Leukocytosis   . Coronary artery disease involving native coronary artery of native heart without angina pectoris   . Prediabetes   . Hemorrhoids   . 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 Left hemiparesis (HCC)   . Cardiomyopathy, ischemic   . 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 Myocardiopathy (HCC)   . Mixed hyperlipidemia   . CVA (cerebral vascular accident) (HCC) 07/02/2017  . Alcohol use 07/02/2017  . Anxiety 07/02/2017  . Depression 07/02/2017  . Acute hyperglycemia 07/02/2017  . Elevated blood-pressure reading without diagnosis of hypertension 07/02/2017  . ADHD 07/02/2017  . Cocaine abuse (HCC) 07/02/2017  . Marijuana abuse 06/20/2017  . Cocaine use 06/20/2017    Zena AmosLynn P Rella Egelston, PT 08/24/2017, 6:08 AM  [facility] [address] [place], [place], [address] Phone: [phone]   Fax:  [phone]  Name: Angel Davies MRN: [id number] Date of Birth: 06/27/1971

## 2017-08-25 ENCOUNTER — Other Ambulatory Visit: Payer: Self-pay | Admitting: Physical Medicine and Rehabilitation

## 2017-08-27 ENCOUNTER — Ambulatory Visit (HOSPITAL_COMMUNITY)
Admission: RE | Admit: 2017-08-27 | Discharge: 2017-08-27 | Disposition: A | Discharge status: Home / Self Care | Payer: Managed Care, Other (non HMO) | Source: Ambulatory Visit | Attending: Internal Medicine | Admitting: Internal Medicine

## 2017-08-27 DIAGNOSIS — F909 Attention-deficit hyperactivity disorder, unspecified type: Secondary | ICD-10-CM | POA: Insufficient documentation

## 2017-08-27 DIAGNOSIS — I2510 Atherosclerotic heart disease of native coronary artery without angina pectoris: Secondary | ICD-10-CM | POA: Insufficient documentation

## 2017-08-27 DIAGNOSIS — Z8673 Personal history of transient ischemic attack (TIA), and cerebral infarction without residual deficits: Secondary | ICD-10-CM | POA: Insufficient documentation

## 2017-08-27 DIAGNOSIS — I998 Other disorder of circulatory system: Secondary | ICD-10-CM | POA: Diagnosis not present

## 2017-08-27 DIAGNOSIS — F329 Major depressive disorder, single episode, unspecified: Secondary | ICD-10-CM | POA: Diagnosis not present

## 2017-08-27 DIAGNOSIS — I5022 Chronic systolic (congestive) heart failure: Secondary | ICD-10-CM | POA: Insufficient documentation

## 2017-08-27 DIAGNOSIS — E785 Hyperlipidemia, unspecified: Secondary | ICD-10-CM | POA: Diagnosis not present

## 2017-08-27 DIAGNOSIS — F419 Anxiety disorder, unspecified: Secondary | ICD-10-CM | POA: Diagnosis not present

## 2017-08-27 DIAGNOSIS — I110 Hypertensive heart disease with heart failure: Secondary | ICD-10-CM | POA: Diagnosis not present

## 2017-08-27 DIAGNOSIS — E669 Obesity, unspecified: Secondary | ICD-10-CM | POA: Insufficient documentation

## 2017-08-27 DIAGNOSIS — Z720 Tobacco use: Secondary | ICD-10-CM | POA: Diagnosis not present

## 2017-08-27 DIAGNOSIS — F1410 Cocaine abuse, uncomplicated: Secondary | ICD-10-CM | POA: Diagnosis not present

## 2017-08-27 MED ORDER — CARVEDILOL 6.25 MG PO TABS: 6.2500 mg | ORAL_TABLET | Freq: Two times a day (BID) | ORAL | 5 refills | Status: DC

## 2017-08-27 MED ORDER — SPIRONOLACTONE 25 MG PO TABS: 12.5000 mg | ORAL_TABLET | Freq: Every day | ORAL | 5 refills | Status: DC

## 2017-08-27 MED ORDER — FENOFIBRATE 160 MG PO TABS: 160.0000 mg | ORAL_TABLET | Freq: Every day | ORAL | 5 refills | Status: DC

## 2017-08-27 MED ORDER — ATORVASTATIN CALCIUM 80 MG PO TABS: 80.0000 mg | ORAL_TABLET | Freq: Every day | ORAL | 5 refills | Status: DC

## 2017-08-27 MED ORDER — DIGOXIN 125 MCG PO TABS: 0.1250 mg | ORAL_TABLET | Freq: Every day | ORAL | 5 refills | Status: DC

## 2017-08-27 NOTE — Patient Instructions (Addendum)
It was great to meet you today.  Continue taking your spironolactone 12.5mg  daily, digoxin 0.14925mcg daily, and Entresto 24-26mg  twice daily.  Increase your carvedilol to 6.25mg  twice daily. You can take two of the 3.125mg  tablets until you run out.  Please keep your follow-up appointment with Dr. Gala RomneyBensimhon on 09/19/17.

## 2017-08-27 NOTE — Progress Notes (Signed)
HF MD: Gala RomneyBENSIMHON  HPI:  Angel OkJames D Branlyis a 47 y.o.Caucasian malewith PMH of HTN, ADHD, Obesity, anxiety, substance abuse, and depression. Pt presented 07/02/2017 with CVA. UDS + for cocaine. Neurology followed.Found to have new systolic CHF by echo.Echo 07/02/17 with LVEF 20-25%, grade 2 DD, Trivial MR, Trivial TR ->HF team consulted. With new CHF, pt underwent R/LHC as below that showed severe 3vD with no options for CABG/CAD. HF meds titrated as tolerated.  Admitted to CIR from 12/7 - 07/27/17 with marked improvement. Med titration somewhat limited due to soft pressures.   Pt presents today for pharmacist-led medication titration. He endorses recent problems with medication refills, but otherwise has no complaints other than occasional body cramping. He reports compliance with medications as his wife organizes a pill box for him since he has issues with short term memory loss from his stroke.    Shortness of breath/dyspnea on exertion? no   Orthopnea/PND? no  Edema? no  Lightheadedness/dizziness? Yes - occasionally upon sitting up quickly  Daily weights at home? no  Blood pressure/heart rate monitoring at home? no  Following low-sodium/fluid-restricted diet? yes  HF Medications: -Carvedilol 3.125mg  BID -Entresto 24-26mg  BID -Spironolactone 12.5mg  daily -Digoxin 0.125mg  daily  Has the patient been experiencing any side effects to the medications prescribed?  Yes - cramping in different areas of his body   Does the patient have any problems obtaining medications due to transportation or finances?   no  Understanding of regimen: poor Understanding of indications: poor Potential of compliance: good Patient understands to avoid NSAIDs. Patient understands to avoid decongestants.   Pertinent Lab Values:  08/14/17: Serum creatinine 0.91 mg/dl, CO2 25 mmol/L, Potassium 4.4 mmol/L, Sodium 140 mmol/L  08/07/17: Digoxin 0.5 mcg/ml   Vital Signs:  Weight: 195 lbs  (dry weight: 195-200 lbs)  Blood pressure: 108/80 mmHg   Heart rate: 85 bpm    Assessment: 1. Chronicsystolic CHF (EF 47-82%20-25%), due to ischemia. NYHA class IIsymptoms. -Volume status stable -Increase carvedilol to 6.25mg  BID given borderline BP and HR >70 bpm.  -Continue Entresto 24-26mg  BID, spironolactone 12.5mg  daily, and digoxin 0.125mg  daily. -Basic disease state pathophysiology, medication indication, mechanism and side effects reviewed at length with patient and he verbalized understanding  2. CAD -LHC 07/05/17 with severe CAD with left-dominant system and occluded mLAD - no targets for CABG or PCI. -Continue atorvastatin and beta blocker. No ASA since on warfarin -No s/s of ischemia.  3. CVA -MRI Head 12/3/18R pontine infarct and sub acute R temporal occipital/PCA territory infarct, along with old left occipital lobe/PCA territory infarct. -Remains on coumadin. INR per coumadin clinic.  4. Cocaine abuse -Abstinent since recent hospitalization. Stressed importance of abstinence moving forward.  5. Alcohol abuse - Abstinent since recent hospitalization. Stressed importance of abstinence moving forward. -Continue thiamine, folic acid, and multivitamin daily.  6. Nicotine Abuse -Continued abstinence and compliance with nicotine patch.   7. HLD - Continue atorvastatin 80mg  daily and fenofibrate 160mg  daily.. - Recheck Lipids 3 months.  Plan: 1) Medication changes: Based on clinical presentation, vital signs and recent labs will increase carvedilol to 6.25mg  BID. 2) Labs: Consider BMET at next follow-up visit. 3) Follow-up: Dr. Gala RomneyBensimhon on 09/19/17.  Fredonia HighlandMichael Bitonti, PharmD, BCPS PGY-2 Cardiology Pharmacy Resident Pager: [phone] 08/27/2017  AGREE WITH ABOVE.  Tyler DeisErika K. Bonnye FavaNicolsen, PharmD, BCPS, CPP Clinical Pharmacist Phone: [phone] 08/27/2017 3:11 PM

## 2017-08-28 ENCOUNTER — Encounter: Payer: Self-pay | Admitting: Physical Therapy

## 2017-08-28 ENCOUNTER — Ambulatory Visit: Payer: Managed Care, Other (non HMO) | Admitting: Physical Therapy

## 2017-08-28 ENCOUNTER — Ambulatory Visit: Payer: Managed Care, Other (non HMO) | Admitting: Occupational Therapy

## 2017-08-28 DIAGNOSIS — M6281 Muscle weakness (generalized): Secondary | ICD-10-CM

## 2017-08-28 DIAGNOSIS — R2689 Other abnormalities of gait and mobility: Secondary | ICD-10-CM

## 2017-08-28 DIAGNOSIS — I69354 Hemiplegia and hemiparesis following cerebral infarction affecting left non-dominant side: Secondary | ICD-10-CM

## 2017-08-28 DIAGNOSIS — R278 Other lack of coordination: Secondary | ICD-10-CM

## 2017-08-28 DIAGNOSIS — I69352 Hemiplegia and hemiparesis following cerebral infarction affecting left dominant side: Secondary | ICD-10-CM | POA: Diagnosis not present

## 2017-08-28 NOTE — Therapy (Signed)
[facility] [address] [place], [place], [address] Phone: [phone]   Fax:  [phone]  Occupational Therapy Treatment  Patient Details  Name: Angel Davies MRN: [id number] Date of Birth: 01/27/1971 Referring Provider: Dr. Claudette LawsAndrew Kirsteins   Encounter Date: 08/28/2017  OT End of Session - 08/28/17 1321    Visit Number  6    Number of Visits  16    Date for OT Re-Evaluation  10/01/17    Authorization Type  [employer] Managed - no auth await info on any visit limitations    OT Start Time  1022    OT Stop Time  1100    OT Time Calculation (min)  38 min    Activity Tolerance  Patient tolerated treatment well    Behavior During Therapy  [facility] for tasks assessed/performed       Past Medical History:  Diagnosis Date  . ADHD (attention deficit hyperactivity disorder)   . Anxiety   . CVA (cerebral vascular accident) (HCC) 07/02/2017   "left sided weakness" (07/02/2017)  . Depression   . History of kidney stones     Past Surgical History:  Procedure Laterality Date  . CYSTOSCOPY WITH URETEROSCOPY, STONE BASKETRY AND STENT PLACEMENT    . RIGHT/LEFT HEART CATH AND CORONARY ANGIOGRAPHY N/A 07/05/2017   Procedure: RIGHT/LEFT HEART CATH AND CORONARY ANGIOGRAPHY;  Surgeon: Dolores PattyBensimhon, Daniel R, MD;  Location: [facility] INVASIVE CV LAB;  Service: Cardiovascular;  Laterality: N/A;    There were no vitals filed for this visit.  Subjective Assessment - 08/28/17 1023    Pertinent History  Pt with R pontine CVA, ADHD, anxiety, depression, poly substance abuse, severe stenosis posterior territory, severe CAD, subacute termporoccipital CVA    Currently in Pain?  Yes    Pain Score  5     Pain Location  Back    Pain Descriptors / Indicators  Aching    Pain Type  Chronic pain    Pain Onset  1 to 4 weeks ago    Pain Frequency  Intermittent    Aggravating Factors   nighttime    Pain Relieving Factors  rest, heat              Treatment: Supine soft tissue mobs to left shoulder, followed by gentle stretch in shoulder flexion  Then reach for other shoulder and butterfly stretch.Seated closed chain midrange shoulder flexion x 15, followed by closed chain shoulder flexion with gentle resistance provided. Rolling ball up vertical surface with LUE x 5 reps, min-mod facilitation, wall pushups 10 reps, min v.c for positioning Seated at table placing grooved pegs with LUE then removing from pegboard and manipulating several pegs in hand to place in container                 OT Short Term Goals - 08/21/17 1243      OT SHORT TERM GOAL #1   Title  Pt will be mod I with HEP for LUE proximal strengthening, grip strength and coordination - 09/03/2017    Status  On-going      OT SHORT TERM GOAL #2   Title  Pt will demonstrate improved grip strength by at least 8 pounds to assist with opening containers - baseline = 45    Status  On-going      OT SHORT TERM GOAL #3   Title  Pt will demonstrate improved coordination as evidenced by decreasing time on 9 hole peg by at least 10 seconds  to assist with tying shoes, other fine motor tasks.    Baseline  53.38    Status  On-going      OT SHORT TERM GOAL #4   Title  Pt will be able to pick up 5 pound object and place in overhead cabinet x5 without dropping and with postural alignment     Status  On-going      OT SHORT TERM GOAL #5   Title  Pt will tolerate at least 20 minutes of ambulatory or standing activity without rest break    Status  On-going        OT Long Term Goals - 08/21/17 1243      OT LONG TERM GOAL #1   Title  Pt will be mod I with upgraded HEP prn - 10/01/3016    Status  On-going      OT LONG TERM GOAL #2   Title  Pt will demonstrate improve grip strength by at least 12 pounds to assist with using LUE as non dominant in house mgmt tasks (baseline = 45)    Status  On-going      OT LONG TERM GOAL #3   Title  Pt will demonstrate  improved coordination as evidenced by decreasing time on 9 hole peg by at least 15 seconds to assist with fine motor tasks (baseline= 53.38)    Status  On-going      OT LONG TERM GOAL #4   Title  Pt will tolerate at least 35 minutes of ambulatory or standing activity without rest breaks.     Status  On-going      OT LONG TERM GOAL #5   Title  Pt will demonstrate ability to lift 8 pound object into overhead cabinet x4 without dropping and with good postural alignment    Status  On-going      OT LONG TERM GOAL #6   Title  Pt will be mod I with putting and driving of golf ball on outdoor, uneven surfaces    Status  On-going            Plan - 08/28/17 1100    Clinical Impression Statement  Pt is progressing toward goals. Pt is progressing towards goals for LUE strength and functional use.    Rehab Potential  Good    OT Frequency  2x / week    OT Duration  8 weeks    OT Treatment/Interventions  Self-care/ADL training;Aquatic Therapy;Moist Heat;Therapeutic exercise;Neuromuscular education;Functional Mobility Training;DME and/or AE instruction;Manual Therapy;Cognitive remediation/compensation;Therapeutic activities;Patient/family education;Balance training    Plan  NMR for LUE proximal control ,  resistive overhead reach       Patient will benefit from skilled therapeutic intervention in order to improve the following deficits and impairments:  Abnormal gait, Decreased activity tolerance, Decreased balance, Decreased coordination, Decreased cognition, Decreased mobility, Difficulty walking, Decreased strength, Impaired UE functional use, Impaired sensation  Visit Diagnosis: Hemiplegia and hemiparesis following cerebral infarction affecting left non-dominant side (HCC)  Muscle weakness (generalized)  Other lack of coordination    Problem List Patient Active Problem List   Diagnosis Date Noted  . Chronic systolic heart failure (HCC) 08/07/2017  . Encounter for therapeutic drug  monitoring 07/27/2017  . Muscle pain   . Supratherapeutic INR   . Leukocytosis   . Coronary artery disease involving native coronary artery of native heart without angina pectoris   . Prediabetes   . Hemorrhoids   . 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 Left hemiparesis (HCC)   .  Cardiomyopathy, ischemic   . 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 Myocardiopathy (HCC)   . Mixed hyperlipidemia   . CVA (cerebral vascular accident) (HCC) 07/02/2017  . Alcohol use 07/02/2017  . Anxiety 07/02/2017  . Depression 07/02/2017  . Acute hyperglycemia 07/02/2017  . Elevated blood-pressure reading without diagnosis of hypertension 07/02/2017  . ADHD 07/02/2017  . Cocaine abuse (HCC) 07/02/2017  . Marijuana abuse 06/20/2017  . Cocaine use 06/20/2017    Jaynie Hitch 08/28/2017, 1:22 PM  [facility] [address] [place], [place], [address] Phone: [phone]   Fax:  [phone]  Name: Angel Davies MRN: [id number] Date of Birth: 1971/03/20

## 2017-08-28 NOTE — Therapy (Signed)
[facility] [address] [place], [place], [address] Phone: [phone]   Fax:  [phone]  Physical Therapy Treatment  Patient Details  Name: Angel Davies MRN: [id number] Date of Birth: 06/01/1971 Referring Provider: Dr. Claudette LawsAndrew Kirsteins   Encounter Date: 08/28/2017  PT End of Session - 08/28/17 2057    Visit Number  6    Number of Visits  9    Date for PT Re-Evaluation  09/06/17    Authorization Type  [employer] - await info on visit limit    PT Start Time  0934    PT Stop Time  1020    PT Time Calculation (min)  46 min       Past Medical History:  Diagnosis Date  . ADHD (attention deficit hyperactivity disorder)   . Anxiety   . CVA (cerebral vascular accident) (HCC) 07/02/2017   "left sided weakness" (07/02/2017)  . Depression   . History of kidney stones     Past Surgical History:  Procedure Laterality Date  . CYSTOSCOPY WITH URETEROSCOPY, STONE BASKETRY AND STENT PLACEMENT    . RIGHT/LEFT HEART CATH AND CORONARY ANGIOGRAPHY N/A 07/05/2017   Procedure: RIGHT/LEFT HEART CATH AND CORONARY ANGIOGRAPHY;  Surgeon: Dolores PattyBensimhon, Daniel R, MD;  Location: [facility];  Service: Cardiovascular;  Laterality: N/A;    There were no vitals filed for this visit.  Subjective Assessment - 08/28/17 2043    Subjective  Pt reports back pain recently at night with more use and increased general activity    Pertinent History  Rt pontine CVA on 07-02-17: alcohol use; cocaine abuse:  anxiety/depression:  myocardiopathy;  acute systolic heart failure; s/p Rt/Lt heart cath & coronary angiography (07-05-17)    Patient Stated Goals  improve balance and gait; increase endurance    Currently in Pain?  Yes    Pain Score  5     Pain Location  Back    Pain Orientation  Right;Left    Pain Descriptors / Indicators  Aching;Tightness    Pain Type  Chronic pain    Pain Onset  1 to 4 weeks ago    Pain Frequency  Intermittent     Aggravating Factors   nighttime     Pain Relieving Factors  rest, heat and stretches                      [facility] Adult PT Treatment/Exercise - 08/28/17 0952      Ambulation/Gait   Ambulation/Gait  Yes    Ambulation/Gait Assistance  5: Supervision    Ambulation/Gait Assistance Details  cues for incr initial heel contact and push off on LLE    Ambulation Distance (Feet)  350 Feet    Assistive device  None    Gait Pattern  Within Functional Limits    Ambulation Surface  Level;Indoor      Lumbar Exercises: Standing   Heel Raises  10 reps bil. LE's      Lumbar Exercises: Quadruped   Opposite Arm/Leg Raise  Right arm/Left leg;Left arm/Right leg;5 reps    Other Quadruped Lumbar Exercises  Lt knee flexion/extension in quadruped position;  Lt hip exteniosn with knee flexed (small pulses toward ceiling)     Other Quadruped Lumbar Exercises  stretching for low back performed in quadruped - sitting back on heels iwth arms outstretched - 30 sec hold x 1 rep      Knee/Hip Exercises: Stretches   Gastroc Stretch  Left;1 rep;30  seconds off step      Knee/Hip Exercises: Plyometrics   Bilateral Jumping  1 set;5 reps    Unilateral Jumping  1 set 3 reps - on LLE with bil. UE support on counter      Knee/Hip Exercises: Standing   Heel Raises  Left;2 sets;10 reps off side of hi/lo mat table for closed chain strengthening    Knee Flexion  Strengthening;Left;1 set;10 reps 5 reps with yellow band; 5 reps without band      Knee/Hip Exercises: Prone   Hamstring Curl  1 set;10 reps initially used red band (removed band due to c/o back pain)      TherAct; jumping x 5 reps;  LLE hopping x 5 reps; 1/2 jumping jacks x 3 reps, lunges alternating x 3 reps (out/in and then up/back) x 3 reps  With UE support prn on countertop   Pt performed Lt hip extension with knee flexed at 90 degrees - small movement with foot flexed - pushing foot up toward ceiling x 10 reps       PT Education -  08/28/17 2055    Education provided  Yes    Education Details  unilateral LLE heel raise on floor:  standing Lt knee flexion with yellow band (if not feeling discomfort in low back)    Person(s) Educated  Patient    Methods  Explanation    Comprehension  Verbalized understanding;Returned demonstration          PT Long Term Goals - 08/07/17 2030      PT LONG TERM GOAL #1   Title  Increase gait velocity from 3.78 ft/sec to >/= 4.1 ft/sec for incr. gait efficiency.    Baseline  8.68 secs = 3.78 ft/sec on 08-06-17    Time  4    Period  Weeks    Status  New    Target Date  09/06/17      PT LONG TERM GOAL #2   Title  Pt will amb. for 10" nonstop with minimal c/o fatigue, i.e. </= 2/10 intensity of fatigue.    Time  4    Period  Weeks    Status  New    Target Date  09/06/17      PT LONG TERM GOAL #3   Title  Pt will increase FGA score by at least 5 points to demo improved balance with gait.    Baseline  TBA    Time  4    Period  Weeks    Status  New    Target Date  09/06/17      PT LONG TERM GOAL #4   Title  Independent in HEP for LLE strengthening and balance.    Time  4    Period  Weeks    Status  New    Target Date  09/06/17      PT LONG TERM GOAL #5   Title  Negotiate 4 steps without rail using a step over step sequence with supervision to demo improved balance.    Time  4    Period  Weeks    Status  New    Target Date  09/06/17            Plan - 08/28/17 2058    Clinical Impression Statement  Pt's c/o low back pain appears to be due to compensatory movements to compensate for Lt hip extensor, hamstring and PF weakness.  Pt had much difficulty performing Lt hamstring strengthening with use  of theraband in anit-gravity position without feeling discomfort in low back.  Overall gait and balance are improving.    Rehab Potential  Good    PT Frequency  2x / week    PT Duration  4 weeks    PT Treatment/Interventions  ADLs/Self Care Home Management;Gait  training;Stair training;Therapeutic activities;Therapeutic exercise;Patient/family education;Neuromuscular re-education;Balance training    PT Next Visit Plan  cont LLE strengthening; core stabilization; needs Lt plantarflexor strengthening in closed chain - cont toward LTG's; jumping, running;     PT Home Exercise Plan  LLE strengthening - hip ext., hamstrings, PF and DF LLE    Consulted and Agree with Plan of Care  Patient       Patient will benefit from skilled therapeutic intervention in order to improve the following deficits and impairments:  Abnormal gait, Cardiopulmonary status limiting activity, Decreased activity tolerance, Decreased balance, Decreased cognition, Decreased endurance, Decreased coordination, Decreased strength, Impaired sensation, Impaired UE functional use, Other (comment)  Visit Diagnosis: Muscle weakness (generalized)  Other abnormalities of gait and mobility  Other lack of coordination     Problem List Patient Active Problem List   Diagnosis Date Noted  . Chronic systolic heart failure (HCC) 08/07/2017  . Encounter for therapeutic drug monitoring 07/27/2017  . Muscle pain   . Supratherapeutic INR   . Leukocytosis   . Coronary artery disease involving native coronary artery of native heart without angina pectoris   . Prediabetes   . Hemorrhoids   . 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 Left hemiparesis (HCC)   . Cardiomyopathy, ischemic   . 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 Myocardiopathy (HCC)   . Mixed hyperlipidemia   . CVA (cerebral vascular accident) (HCC) 07/02/2017  . Alcohol use 07/02/2017  . Anxiety 07/02/2017  . Depression 07/02/2017  . Acute hyperglycemia 07/02/2017  . Elevated blood-pressure reading without diagnosis of hypertension 07/02/2017  . ADHD 07/02/2017  . Cocaine abuse (HCC) 07/02/2017  . Marijuana abuse 06/20/2017  . Cocaine use 06/20/2017    DildayDonavan Burnet, PT 08/28/2017, 9:19  PM  [facility] [address] [place], [place], [address] Phone: [phone]   Fax:  [phone]  Name: Angel Davies MRN: [id number] Date of Birth: March 29, 1971

## 2017-08-29 ENCOUNTER — Ambulatory Visit (INDEPENDENT_AMBULATORY_CARE_PROVIDER_SITE_OTHER): Payer: Managed Care, Other (non HMO)

## 2017-08-29 ENCOUNTER — Ambulatory Visit: Payer: Managed Care, Other (non HMO) | Admitting: Physical Therapy

## 2017-08-29 ENCOUNTER — Ambulatory Visit: Payer: Managed Care, Other (non HMO) | Admitting: Occupational Therapy

## 2017-08-29 ENCOUNTER — Encounter: Payer: Self-pay | Admitting: Physical Therapy

## 2017-08-29 ENCOUNTER — Encounter: Payer: Self-pay | Admitting: Occupational Therapy

## 2017-08-29 DIAGNOSIS — I6350 Cerebral infarction due to unspecified occlusion or stenosis of unspecified cerebral artery: Secondary | ICD-10-CM | POA: Diagnosis not present

## 2017-08-29 DIAGNOSIS — R293 Abnormal posture: Secondary | ICD-10-CM

## 2017-08-29 DIAGNOSIS — M6281 Muscle weakness (generalized): Secondary | ICD-10-CM

## 2017-08-29 DIAGNOSIS — I255 Ischemic cardiomyopathy: Secondary | ICD-10-CM

## 2017-08-29 DIAGNOSIS — Z5181 Encounter for therapeutic drug level monitoring: Secondary | ICD-10-CM

## 2017-08-29 DIAGNOSIS — R2681 Unsteadiness on feet: Secondary | ICD-10-CM

## 2017-08-29 DIAGNOSIS — I236 Thrombosis of atrium, auricular appendage, and ventricle as current complications following acute myocardial infarction: Secondary | ICD-10-CM

## 2017-08-29 DIAGNOSIS — R278 Other lack of coordination: Secondary | ICD-10-CM

## 2017-08-29 DIAGNOSIS — I69354 Hemiplegia and hemiparesis following cerebral infarction affecting left non-dominant side: Secondary | ICD-10-CM

## 2017-08-29 DIAGNOSIS — I69118 Other symptoms and signs involving cognitive functions following nontraumatic intracerebral hemorrhage: Secondary | ICD-10-CM

## 2017-08-29 DIAGNOSIS — I69352 Hemiplegia and hemiparesis following cerebral infarction affecting left dominant side: Secondary | ICD-10-CM | POA: Diagnosis not present

## 2017-08-29 LAB — POCT INR: INR: 2.4

## 2017-08-29 NOTE — Therapy (Signed)
[facility] [address] [place], [place], [address] Phone: [phone]   Fax:  [phone]  Occupational Therapy Treatment  Patient Details  Name: Angel Davies MRN: [id number] Date of Birth: 1970-10-06 Referring Provider: Dr. Claudette Laws   Encounter Date: 08/29/2017  OT End of Session - 08/29/17 1403    Visit Number  7    Number of Visits  16    Date for OT Re-Evaluation  10/01/17    Authorization Type  [employer] Managed - no auth await info on any visit limitations    OT Start Time  1145    OT Stop Time  1231    OT Time Calculation (min)  46 min    Activity Tolerance  Patient tolerated treatment well       Past Medical History:  Diagnosis Date  . ADHD (attention deficit hyperactivity disorder)   . Anxiety   . CVA (cerebral vascular accident) (HCC) 07/02/2017   "left sided weakness" (07/02/2017)  . Depression   . History of kidney stones     Past Surgical History:  Procedure Laterality Date  . CYSTOSCOPY WITH URETEROSCOPY, STONE BASKETRY AND STENT PLACEMENT    . RIGHT/LEFT HEART CATH AND CORONARY ANGIOGRAPHY N/A 07/05/2017   Procedure: RIGHT/LEFT HEART CATH AND CORONARY ANGIOGRAPHY;  Surgeon: Dolores Patty, MD;  Location: [facility];  Service: Cardiovascular;  Laterality: N/A;    There were no vitals filed for this visit.  Subjective Assessment - 08/29/17 1148    Subjective   My arm is starting to feel more normal    Pertinent History  Pt with R pontine CVA, ADHD, anxiety, depression, poly substance abuse, severe stenosis posterior territory, severe CAD, subacute termporoccipital CVA    Currently in Pain?  Yes    Pain Score  1     Pain Location  Back    Pain Orientation  Lower;Mid    Pain Descriptors / Indicators  Aching;Tightness    Pain Type  Chronic pain    Pain Onset  1 to 4 weeks ago    Pain Frequency  Intermittent    Aggravating Factors   night time    Pain Relieving Factors   rest, heat and stretches                   OT Treatments/Exercises (OP) - 08/29/17 0001      Neurological Re-education Exercises   Other Exercises 1  Neuro re ed to upgrade HEP to address mid to high reach - see pt instuction section for details. Pt now able to incorpoate weight in open chain activity in supine as well as closed chained overhead in sitting without weight.  Cues to focus on form, normal movement pattern, active trunk, timing and grading of movement.  Pt with improved scapular alignment/activity with reach.  Pt is very motivated and very compliant with HEP. Reviewed all HEP activities and assisted pt in elimnating old programs.  Pt verablized understanding.  Pt able to return demonstrate all activities today without pain.               OT Education - 08/29/17 1401    Education provided  Yes    Education Details  upgraded HEP for LUE    Person(s) Educated  Patient    Methods  Explanation;Demonstration;Handout    Comprehension  Verbalized understanding;Returned demonstration       OT Short Term Goals - 08/29/17 1401      OT  SHORT TERM GOAL #1   Title  Pt will be mod I with HEP for LUE proximal strengthening, grip strength and coordination - 09/03/2017    Status  Achieved      OT SHORT TERM GOAL #2   Title  Pt will demonstrate improved grip strength by at least 8 pounds to assist with opening containers - baseline = 45    Status  On-going      OT SHORT TERM GOAL #3   Title  Pt will demonstrate improved coordination as evidenced by decreasing time on 9 hole peg by at least 10 seconds to assist with tying shoes, other fine motor tasks.    Baseline  53.38    Status  On-going      OT SHORT TERM GOAL #4   Title  Pt will be able to pick up 5 pound object and place in overhead cabinet x5 without dropping and with postural alignment     Status  On-going      OT SHORT TERM GOAL #5   Title  Pt will tolerate at least 20 minutes of ambulatory or standing  activity without rest break    Status  On-going        OT Long Term Goals - 08/29/17 1402      OT LONG TERM GOAL #1   Title  Pt will be mod I with upgraded HEP prn - 10/01/3016    Status  On-going      OT LONG TERM GOAL #2   Title  Pt will demonstrate improve grip strength by at least 12 pounds to assist with using LUE as non dominant in house mgmt tasks (baseline = 45)    Status  On-going      OT LONG TERM GOAL #3   Title  Pt will demonstrate improved coordination as evidenced by decreasing time on 9 hole peg by at least 15 seconds to assist with fine motor tasks (baseline= 53.38)    Status  On-going      OT LONG TERM GOAL #4   Title  Pt will tolerate at least 35 minutes of ambulatory or standing activity without rest breaks.     Status  On-going      OT LONG TERM GOAL #5   Title  Pt will demonstrate ability to lift 8 pound object into overhead cabinet x4 without dropping and with good postural alignment    Status  On-going      OT LONG TERM GOAL #6   Title  Pt will be mod I with putting and driving of golf ball on outdoor, uneven surfaces    Status  On-going            Plan - 08/29/17 1402    Clinical Impression Statement  Pt progressing toward goals - pt with improved scapular alignment/activity and LUE control for mid to beginning high reach.     Rehab Potential  Good    OT Frequency  2x / week    OT Duration  8 weeks    OT Treatment/Interventions  Self-care/ADL training;Aquatic Therapy;Moist Heat;Therapeutic exercise;Neuromuscular education;Functional Mobility Training;DME and/or AE instruction;Manual Therapy;Cognitive remediation/compensation;Therapeutic activities;Patient/family education;Balance training    Plan  check STG's, NMR for LUE proximal control ,  resistive overhead reach    Consulted and Agree with Plan of Care  Patient       Patient will benefit from skilled therapeutic intervention in order to improve the following deficits and impairments:   Abnormal gait, Decreased activity tolerance,  Decreased balance, Decreased coordination, Decreased cognition, Decreased mobility, Difficulty walking, Decreased strength, Impaired UE functional use, Impaired sensation  Visit Diagnosis: Hemiplegia and hemiparesis following cerebral infarction affecting left non-dominant side (HCC)  Muscle weakness (generalized)  Other lack of coordination  Unsteadiness on feet  Abnormal posture  Other symptoms and signs involving cognitive functions following nontraumatic intracerebral hemorrhage    Problem List Patient Active Problem List   Diagnosis Date Noted  . Chronic systolic heart failure (HCC) 08/07/2017  . Encounter for therapeutic drug monitoring 07/27/2017  . Muscle pain   . Supratherapeutic INR   . Leukocytosis   . Coronary artery disease involving native coronary artery of native heart without angina pectoris   . Prediabetes   . Hemorrhoids   . 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 Left hemiparesis (HCC)   . Cardiomyopathy, ischemic   . 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 Myocardiopathy (HCC)   . Mixed hyperlipidemia   . CVA (cerebral vascular accident) (HCC) 07/02/2017  . Alcohol use 07/02/2017  . Anxiety 07/02/2017  . Depression 07/02/2017  . Acute hyperglycemia 07/02/2017  . Elevated blood-pressure reading without diagnosis of hypertension 07/02/2017  . ADHD 07/02/2017  . Cocaine abuse (HCC) 07/02/2017  . Marijuana abuse 06/20/2017  . Cocaine use 06/20/2017    Norton Pastel, OTR/L 08/29/2017, 2:05 PM  [facility] [address] [place], [place], [address] Phone: [phone]   Fax:  [phone]  Name: Angel Davies MRN: [id number] Date of Birth: 04-18-1971

## 2017-08-29 NOTE — Patient Instructions (Signed)
Bridging: with Straight Leg Raise    With legs bent, lift buttocks __4-6__ inches from floor. Then slowly extend right knee, keeping stomach tight. Hold 5 seconds. Then put right foot back down and repeat with left leg. Keep your hips level.  Repeat __10-20__ times per set. Do __1__ sets per session. Do __1__ sessions per day.  http://orth.exer.us/1104   Copyright  VHI. All rights reserved.

## 2017-08-29 NOTE — Therapy (Signed)
[facility] [address] [place], [place], [address] Phone: [phone]   Fax:  [phone]  Physical Therapy Treatment  Patient Details  Name: Angel CooperJames D Mckiddy MRN: [id number] Date of Birth: 05/09/1971 Referring Provider: Dr. Claudette LawsAndrew Kirsteins   Encounter Date: 08/29/2017  PT End of Session - 08/29/17 1703    Visit Number  7    Number of Visits  9    Date for PT Re-Evaluation  09/06/17    Authorization Type  [employer] - await info on visit limit    PT Start Time  1103    PT Stop Time  1146    PT Time Calculation (min)  43 min    Activity Tolerance  Patient limited by pain shin splint pain RLE limited balance activities    Behavior During Therapy  [facility] for tasks assessed/performed       Past Medical History:  Diagnosis Date  . ADHD (attention deficit hyperactivity disorder)   . Anxiety   . CVA (cerebral vascular accident) (HCC) 07/02/2017   "left sided weakness" (07/02/2017)  . Depression   . History of kidney stones     Past Surgical History:  Procedure Laterality Date  . CYSTOSCOPY WITH URETEROSCOPY, STONE BASKETRY AND STENT PLACEMENT    . RIGHT/LEFT HEART CATH AND CORONARY ANGIOGRAPHY N/A 07/05/2017   Procedure: RIGHT/LEFT HEART CATH AND CORONARY ANGIOGRAPHY;  Surgeon: Dolores PattyBensimhon, Daniel R, MD;  [facility];  Service: Cardiovascular;  Laterality: N/A;    There were no vitals filed for this visit.  Subjective Assessment - 08/29/17 1106    Subjective  Pt reports back pain is better than yesterday, but still sore. Wants to go back to the gym to do cardio, but thinks he needs to be cleared by MD.     Pertinent History  Rt pontine CVA on 07-02-17: alcohol use; cocaine abuse:  anxiety/depression:  myocardiopathy;  acute systolic heart failure; s/p Rt/Lt heart cath & coronary angiography (07-05-17)    Patient Stated Goals  improve balance and gait; increase endurance    Currently in Pain?  Yes    Pain  Score  1     Pain Location  Back    Pain Orientation  Lower;Mid    Pain Descriptors / Indicators  Aching;Tightness    Pain Type  Chronic pain    Pain Onset  1 to 4 weeks ago    Pain Frequency  Intermittent    Multiple Pain Sites  No                      [facility] Adult PT Treatment/Exercise - 08/29/17 1659      Ambulation/Gait   Ramp  Other (comment) blue mat, face up ramp; EC, marching; tandem, EC      Lumbar Exercises: Seated   Long Arc Quad on [employer]  Strengthening;Both    Hip Flexion on [employer]  Strengthening;Both    Other Seated Lumbar Exercises  seated on ball unilateral then bilateral shoulder flexion      Lumbar Exercises: Supine   Bridge  5 reps    Bridge with March  10 reps 2 sets    Other Supine Lumbar Exercises  bridge with single leg extension x 5 sec hold x 10      Tall kneeling to 1/2 kneeling, alternating legs x 5 each; tall kneeling side stepping along edge of mat x 8 reps Quadruped opposite arm/leg extension with pt reporting pulling on his back  and changed to bridges.     Balance Exercises - 08/29/17 1656      Balance Exercises: Standing   Balance Beam  blue beam horizontal feet together EC (loses balance posteriorly), EO alternating forward kicks x 20; alternating backward kick with hip hinge x 20 **pt then began to report pain along Rt shin "Shin splints" Reports history of and recently becoming painful again    Other Standing Exercises  Bosu; flat side up, squats x 10; flat side down balance EO, EC, headturns        PT Education - 08/29/17 1702    Education provided  Yes    Education Details  addition to [employer]) Educated  Patient    Methods  Explanation;Demonstration;Verbal cues;Handout    Comprehension  Verbalized understanding;Returned demonstration;Verbal cues required;Need further instruction          PT Long Term Goals - 08/07/17 2030      PT LONG TERM GOAL #1   Title  Increase gait velocity from 3.78 ft/sec to >/= 4.1  ft/sec for incr. gait efficiency.    Baseline  8.68 secs = 3.78 ft/sec on 08-06-17    Time  4    Period  Weeks    Status  New    Target Date  09/06/17      PT LONG TERM GOAL #2   Title  Pt will amb. for 10" nonstop with minimal c/o fatigue, i.e. </= 2/10 intensity of fatigue.    Time  4    Period  Weeks    Status  New    Target Date  09/06/17      PT LONG TERM GOAL #3   Title  Pt will increase FGA score by at least 5 points to demo improved balance with gait.    Baseline  TBA    Time  4    Period  Weeks    Status  New    Target Date  09/06/17      PT LONG TERM GOAL #4   Title  Independent in HEP for LLE strengthening and balance.    Time  4    Period  Weeks    Status  New    Target Date  09/06/17      PT LONG TERM GOAL #5   Title  Negotiate 4 steps without rail using a step over step sequence with supervision to demo improved balance.    Time  4    Period  Weeks    Status  New    Target Date  09/06/17            Plan - 08/29/17 1707    Clinical Impression Statement  Session focused on balance and strengthening (especially core strength). Patient reported recent onset rt "shin splints" as he has had in the past. This pain was aggravated by working on compliant surfaces for balance training. Patient interested in transitioning to working out in a gym, however states he was told he has to have clearance by MD (assume by cardiologist?). Patient unsure who told him to get clearance, but he does have documented EF only 20%. Will discuss further next visit.     Rehab Potential  Good    PT Frequency  2x / week    PT Duration  4 weeks    PT Treatment/Interventions  ADLs/Self Care Home Management;Gait training;Stair training;Therapeutic activities;Therapeutic exercise;Patient/family education;Neuromuscular re-education;Balance training    PT Next Visit Plan  discuss d/c vs  recert; if d/c'g he does not see cardiologist until 2/20; cont LLE strengthening; core stabilization; needs  Lt plantarflexor strengthening in closed chain - cont toward LTG's; jumping, running;     PT Home Exercise Plan  LLE strengthening - hip ext., hamstrings, PF and DF LLE    Consulted and Agree with Plan of Care  Patient       Patient will benefit from skilled therapeutic intervention in order to improve the following deficits and impairments:  Abnormal gait, Cardiopulmonary status limiting activity, Decreased activity tolerance, Decreased balance, Decreased cognition, Decreased endurance, Decreased coordination, Decreased strength, Impaired sensation, Impaired UE functional use, Other (comment)  Visit Diagnosis: Muscle weakness (generalized)  Unsteadiness on feet     Problem List Patient Active Problem List   Diagnosis Date Noted  . Chronic systolic heart failure (HCC) 08/07/2017  . Encounter for therapeutic drug monitoring 07/27/2017  . Muscle pain   . Supratherapeutic INR   . Leukocytosis   . Coronary artery disease involving native coronary artery of native heart without angina pectoris   . Prediabetes   . Hemorrhoids   . 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 Left hemiparesis (HCC)   . Cardiomyopathy, ischemic   . 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 Myocardiopathy (HCC)   . Mixed hyperlipidemia   . CVA (cerebral vascular accident) (HCC) 07/02/2017  . Alcohol use 07/02/2017  . Anxiety 07/02/2017  . Depression 07/02/2017  . Acute hyperglycemia 07/02/2017  . Elevated blood-pressure reading without diagnosis of hypertension 07/02/2017  . ADHD 07/02/2017  . Cocaine abuse (HCC) 07/02/2017  . Marijuana abuse 06/20/2017  . Cocaine use 06/20/2017    Zena Amos, PT 08/29/2017, 5:14 PM  [facility] [address] [place], [place], [address] Phone: [phone]   Fax:  [phone]  Name: WOODLEY PETZOLD MRN: [id number] Date of Birth: [birth year]

## 2017-08-29 NOTE — Patient Instructions (Signed)
Description   Continue same dose of coumadin 2mg  (1 tablet) daily except Coumadin 3mg  (1.5 tablets) on Saturdays.   Recheck INR in 2 weeks.

## 2017-08-29 NOTE — Patient Instructions (Addendum)
Add these to your home program for your arm.  ALWAYS work within your limits of pain - go slowly and focus on form and control!!!  Do every day - start with 1 time a day and build up to 2 times per day.  DO NOT DO MORE THAN I TELL YOU TO DO!!! We do not want any pain to start.     1. Sit and hold a large beach ball between your hands.  Put a little pressure between your hands through the ball.  Sit up tall and keeping arms straight, drop your shoulders and raise the ball slowly above your head. Stop when you find it hard to keep your elbow straight.  Lower ball and repeat. Do 12, rest then do 12 more.  You may want to do these in front of a mirror.    2. Lay on your back.  Hold a 4 pound weight in your left hand.  Start with your arm by your side, elbow bent at 90*, palm facing in.  Slowly raise the weight up toward the ceiling until your elbow is straight, then slowly lower back down.  Do 12, rest then do 12 more.  Go slow and control the movement.  3.  Lay on your back  Hold a 2 pound weight in your left hand.  Raise the weight up toward the ceiling until elbow is straight. KEEPING THE ELBOW STRAIGHT, slowly raise the weight over your head. Go as far as you can without pain and with good form and control.  Bring weight back over your chest.  Do 12, rest then do 12 more.

## 2017-09-03 ENCOUNTER — Encounter: Payer: Managed Care, Other (non HMO) | Attending: Physical Medicine & Rehabilitation

## 2017-09-03 ENCOUNTER — Ambulatory Visit (HOSPITAL_BASED_OUTPATIENT_CLINIC_OR_DEPARTMENT_OTHER): Payer: Managed Care, Other (non HMO) | Admitting: Physical Medicine & Rehabilitation

## 2017-09-03 ENCOUNTER — Encounter: Payer: Self-pay | Admitting: Physical Medicine & Rehabilitation

## 2017-09-03 VITALS — BP 109/71 | HR 73 | Resp 14

## 2017-09-03 DIAGNOSIS — F1020 Alcohol dependence, uncomplicated: Secondary | ICD-10-CM | POA: Insufficient documentation

## 2017-09-03 DIAGNOSIS — F329 Major depressive disorder, single episode, unspecified: Secondary | ICD-10-CM | POA: Diagnosis not present

## 2017-09-03 DIAGNOSIS — H547 Unspecified visual loss: Secondary | ICD-10-CM | POA: Diagnosis not present

## 2017-09-03 DIAGNOSIS — Z8673 Personal history of transient ischemic attack (TIA), and cerebral infarction without residual deficits: Secondary | ICD-10-CM | POA: Diagnosis not present

## 2017-09-03 DIAGNOSIS — F419 Anxiety disorder, unspecified: Secondary | ICD-10-CM | POA: Insufficient documentation

## 2017-09-03 DIAGNOSIS — F909 Attention-deficit hyperactivity disorder, unspecified type: Secondary | ICD-10-CM | POA: Insufficient documentation

## 2017-09-03 DIAGNOSIS — F1910 Other psychoactive substance abuse, uncomplicated: Secondary | ICD-10-CM | POA: Insufficient documentation

## 2017-09-03 DIAGNOSIS — I69354 Hemiplegia and hemiparesis following cerebral infarction affecting left non-dominant side: Secondary | ICD-10-CM | POA: Insufficient documentation

## 2017-09-03 DIAGNOSIS — Z87891 Personal history of nicotine dependence: Secondary | ICD-10-CM | POA: Insufficient documentation

## 2017-09-03 DIAGNOSIS — I2510 Atherosclerotic heart disease of native coronary artery without angina pectoris: Secondary | ICD-10-CM | POA: Diagnosis not present

## 2017-09-03 NOTE — Patient Instructions (Addendum)
1. 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 2. Subacute small RIGHT temporal occipital lobe/PCA territory infarct. Old LEFT occipital lobe/PCA territory infarct.   Graduated return to driving instructions were provided. It is recommended that the patient first drives with another licensed driver in an empty parking lot. If the patient does well with this, and they can drive on a quiet street with the licensed driver. If the patient does well with this they can drive on a busy street with a licensed driver. If the patient does well with this, the next time out they can go by himself. For the first month after resuming driving, I recommend no nighttime or Interstate driving.

## 2017-09-03 NOTE — Progress Notes (Signed)
Subjective:    Patient ID: Angel Davies, male    DOB: 11-26-1970, 47 y.o.   MRN: [id number] 47 y.o. male with history of ADHD, anxiety, depression/anxiety, fall 12/2016 with loss of peripheral vision and onset of HA, polysubtance abuse who was admitted on 07/02/17 with fall, slurred speech and left sided weakness. Patient reported that he was drinking ETOH and used cocaine the night before--UDS positive for cocaine and THC.MRI brain revealed"Acute RIGHT pontine infarct, Subacute small RIGHT temporal occipital lobe/PCA territory infarct and severe stenosis distal rigth P2 segment. 2D echo showed EF 20-25% with moderately dilated LV, akinesis of anterior and apical myocardium with grade 2 diastolic dysfunction.   HPI  Patient has been attending outpatient PT and OT.  He feels like his left upper extremity strength has improved.  He does not note any visual problems any longer. Reviewed PT OT notes I do not see any concerns about visual perceptual deficits.    Pain Inventory Average Pain 5 Pain Right Now 4 My pain is intermittent, dull and aching  In the last 24 hours, has pain interfered with the following? General activity 2 Relation with others 2 Enjoyment of life 5 What TIME of day is your pain at its worst? morning Sleep (in general) Fair  Pain is worse with: unsure Pain improves with: n/a Relief from Meds: n/a  Mobility walk without assistance how many minutes can you walk? 15-20 ability to climb steps?  yes transfers alone Do you have any goals in this area?  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  no  Physicians involved in your care Any changes since last visit?  no   Family History  Problem Relation Age of Onset  . Stroke Mother   . Colon cancer Mother   . Prostate cancer Father   . Seizures Neg Hx    Social  History   Socioeconomic History  . Marital status: Married    Spouse name: None  . Number of children: None  . Years of education: None  . Highest education level: None  Social Needs  . Financial resource strain: None  . Food insecurity - worry: None  . Food insecurity - inability: None  . Transportation needs - medical: None  . Transportation needs - non-medical: None  Occupational History  . None  Tobacco Use  . Smoking status: Former Smoker    Packs/day: 0.00    Years: 0.00    Pack years: 0.00  . Smokeless tobacco: Never Used  Substance and Sexual Activity  . Alcohol use: Yes    Alcohol/week: 2.4 oz    Types: 4 Glasses of wine per week  . Drug use: Yes    Types: Marijuana, "Crack" cocaine    Comment: 07/02/2017 "smokes marijuana a couple days/week" , "08/20/17 vapes marijuana juice"  . Sexual activity: Yes  Other Topics Concern  . None  Social History Narrative  . None   Past Surgical History:  Procedure Laterality Date  . CYSTOSCOPY WITH URETEROSCOPY, STONE BASKETRY AND STENT PLACEMENT    . RIGHT/LEFT HEART CATH AND CORONARY ANGIOGRAPHY N/A 07/05/2017   Procedure: RIGHT/LEFT HEART CATH AND CORONARY ANGIOGRAPHY;  Surgeon: Dolores Patty, MD;  [facility];  Service: Cardiovascular;  Laterality: N/A;   Past Medical History:  Diagnosis Date  . ADHD (attention deficit hyperactivity disorder)   . Anxiety   .  CVA (cerebral vascular accident) (HCC) 07/02/2017   "left sided weakness" (07/02/2017)  . Depression   . History of kidney stones    BP 109/71 (BP Location: Right Arm, Patient Position: Sitting, Cuff Size: Normal)   Pulse 73   Resp 14   SpO2 95%   Opioid Risk Score:   Fall Risk Score:  `1  Depression screen PHQ 2/9  Depression screen PHQ 2/9 08/13/2017  Decreased Interest 0  Down, Depressed, Hopeless 0  PHQ - 2 Score 0    Review of Systems  Constitutional: Negative.   Eyes: Negative.   Respiratory: Negative.   Cardiovascular:  Negative.   Gastrointestinal: Negative.   Endocrine: Negative.   Genitourinary: Negative.   Musculoskeletal: Positive for back pain.  Skin: Negative.   Allergic/Immunologic: Negative.   Neurological: Positive for numbness.       Tingling  Psychiatric/Behavioral: Positive for dysphoric mood. The patient is nervous/anxious.   All other systems reviewed and are negative.      Objective:   Physical Exam  Constitutional: He appears well-developed and well-nourished.  HENT:  Head: Normocephalic and atraumatic.  Eyes: Conjunctivae and EOM are normal. Pupils are equal, round, and reactive to light.  Psychiatric: His speech is normal. Judgment and thought content normal. His mood appears anxious.  Nursing note and vitals reviewed.   Visual fields are intact to confrontation testing Motor strength is 4/5 in the left deltoid, bicep, tricep, grip 5/5 in the right deltoid, bicep, tricep, grip 5/5 bilateral hip flexor knee extensor ankle dorsiflexor Speech without evidence of dysarthria. Ambulates without assistive device no evidence of toe drag or knee instability     Assessment & Plan:  1.  Right pontine infarct with residual left upper extremity weakness continue outpatient PT OT 2.  Right PCA subacute infarct no residual visual complaints Patient has questions regarding stroke and prevention, recommend follow-up with neurology, Dr. Roda ShuttersXu 3.  Cognitive deficits post stroke relatively mild.  If he wishes to resume work, would consider neuropsych follow-up. Return to clinic in 6 weeks  Graduated return to driving instructions were provided. It is recommended that the patient first drives with another licensed driver in an empty parking lot. If the patient does well with this, and they can drive on a quiet street with the licensed driver. If the patient does well with this they can drive on a busy street with a licensed driver. If the patient does well with this, the next time out they can go  by himself. For the first month after resuming driving, I recommend no nighttime or Interstate driving.

## 2017-09-04 ENCOUNTER — Ambulatory Visit: Payer: Managed Care, Other (non HMO) | Attending: Physical Medicine & Rehabilitation | Admitting: Physical Therapy

## 2017-09-04 ENCOUNTER — Ambulatory Visit: Payer: Managed Care, Other (non HMO) | Admitting: Occupational Therapy

## 2017-09-04 DIAGNOSIS — M6281 Muscle weakness (generalized): Secondary | ICD-10-CM | POA: Diagnosis present

## 2017-09-04 DIAGNOSIS — R278 Other lack of coordination: Secondary | ICD-10-CM | POA: Diagnosis present

## 2017-09-04 DIAGNOSIS — R2681 Unsteadiness on feet: Secondary | ICD-10-CM | POA: Insufficient documentation

## 2017-09-04 DIAGNOSIS — R293 Abnormal posture: Secondary | ICD-10-CM | POA: Diagnosis present

## 2017-09-04 DIAGNOSIS — I69118 Other symptoms and signs involving cognitive functions following nontraumatic intracerebral hemorrhage: Secondary | ICD-10-CM | POA: Insufficient documentation

## 2017-09-04 DIAGNOSIS — R2689 Other abnormalities of gait and mobility: Secondary | ICD-10-CM | POA: Insufficient documentation

## 2017-09-04 DIAGNOSIS — I69354 Hemiplegia and hemiparesis following cerebral infarction affecting left non-dominant side: Secondary | ICD-10-CM | POA: Diagnosis present

## 2017-09-04 NOTE — Therapy (Signed)
[facility] [address] [place], [place], [address] Phone: [phone]   Fax:  [phone]  Occupational Therapy Treatment  Patient Details  Name: Angel Davies MRN: [id number] Date of Birth: Dec 11, 1970 Referring Provider: Dr. Alysia Penna   Encounter Date: 09/04/2017  OT End of Session - 09/04/17 1241    Visit Number  8    Number of Visits  16    Date for OT Re-Evaluation  10/01/17    Authorization Type  [employer] Managed - no auth await info on any visit limitations    OT Start Time  1147    OT Stop Time  1230    OT Time Calculation (min)  43 min    Activity Tolerance  Patient tolerated treatment well       Past Medical History:  Diagnosis Date  . ADHD (attention deficit hyperactivity disorder)   . Anxiety   . CVA (cerebral vascular accident) ([place]) 07/02/2017   "left sided weakness" (07/02/2017)  . Depression   . History of kidney stones     Past Surgical History:  Procedure Laterality Date  . CYSTOSCOPY WITH URETEROSCOPY, STONE BASKETRY AND STENT PLACEMENT    . RIGHT/LEFT HEART CATH AND CORONARY ANGIOGRAPHY N/A 07/05/2017   Procedure: RIGHT/LEFT HEART CATH AND CORONARY ANGIOGRAPHY;  Surgeon: Jolaine Artist, MD;  Location: [facility];  Service: Cardiovascular;  Laterality: N/A;    There were no vitals filed for this visit.                OT Treatments/Exercises (OP) - 09/04/17 0001      ADLs   ADL Comments  Assessed STG's - see goal section for details.  Also assessed last STG to determine if pt could tolerate 20 minutes of ambulatory activity - pt tolerated well and activity consisted of indoor and outdoor activities. Pt rated fatigue as 3/10. and stated his legs felt a "little weak".  Pt met all but 1 STG.                 OT Short Term Goals - 09/04/17 1156      OT SHORT TERM GOAL #1   Title  Pt will be mod I with HEP for LUE proximal strengthening, grip strength  and coordination - 09/03/2017    Status  Achieved      OT SHORT TERM GOAL #2   Title  Pt will demonstrate improved grip strength by at least 8 pounds to assist with opening containers - baseline = 45    Status  On-going 09/04/2017  50 pounds      OT SHORT TERM GOAL #3   Title  Pt will demonstrate improved coordination as evidenced by decreasing time on 9 hole peg by at least 10 seconds to assist with tying shoes, other fine motor tasks.    Baseline  53.38    Status  Achieved 09/04/2017 38.42      OT SHORT TERM GOAL #4   Title  Pt will be able to pick up 5 pound object and place in overhead cabinet x5 without dropping and with postural alignment     Status  Achieved      OT SHORT TERM GOAL #5   Title  Pt will tolerate at least 20 minutes of ambulatory or standing activity without rest break    Status  Achieved 09/04/2017  fatigue rated 3/10 and BP 110/72        OT Long Term Goals -  09/04/17 1239      OT LONG TERM GOAL #1   Title  Pt will be mod I with upgraded HEP prn - 10/01/3016    Status  On-going      OT LONG TERM GOAL #2   Title  Pt will demonstrate improve grip strength by at least 12 pounds to assist with using LUE as non dominant in house mgmt tasks (baseline = 45)    Status  On-going      OT LONG TERM GOAL #3   Title  Pt will demonstrate improved coordination as evidenced by decreasing time on 9 hole peg by at least 18 seconds to assist with fine motor tasks (baseline= 53.38)    Status  Revised      OT LONG TERM GOAL #4   Title  Pt will tolerate at least 35 minutes of ambulatory or standing activity without rest breaks.     Status  On-going      OT LONG TERM GOAL #5   Title  Pt will demonstrate ability to lift 8 pound object into overhead cabinet x4 without dropping and with good postural alignment    Status  On-going      OT LONG TERM GOAL #6   Title  Pt will be mod I with putting and driving of golf ball on outdoor, uneven surfaces    Status  On-going             Plan - 09/04/17 1240    Clinical Impression Statement  Pt has met all but 1 STG - pt making good progress toward goals.    Rehab Potential  Good    OT Frequency  2x / week    OT Duration  8 weeks    OT Treatment/Interventions  Self-care/ADL training;Aquatic Therapy;Moist Heat;Therapeutic exercise;Neuromuscular education;Functional Mobility Training;DME and/or AE instruction;Manual Therapy;Cognitive remediation/compensation;Therapeutic activities;Patient/family education;Balance training    Plan   NMR for LUE proximal control ,  resistive overhead reach, grip strength, coordination    Consulted and Agree with Plan of Care  Patient       Patient will benefit from skilled therapeutic intervention in order to improve the following deficits and impairments:  Abnormal gait, Decreased activity tolerance, Decreased balance, Decreased coordination, Decreased cognition, Decreased mobility, Difficulty walking, Decreased strength, Impaired UE functional use, Impaired sensation  Visit Diagnosis: Hemiplegia and hemiparesis following cerebral infarction affecting left non-dominant side (HCC)  Muscle weakness (generalized)  Other lack of coordination  Unsteadiness on feet  Abnormal posture  Other symptoms and signs involving cognitive functions following nontraumatic intracerebral hemorrhage    Problem List Patient Active Problem List   Diagnosis Date Noted  . Chronic systolic heart failure ([place]) 08/07/2017  . Encounter for therapeutic drug monitoring 07/27/2017  . Muscle pain   . Supratherapeutic INR   . Leukocytosis   . Coronary artery disease involving native coronary artery of native heart without angina pectoris   . Prediabetes   . Hemorrhoids   . Right pontine stroke ([place]) 07/06/2017  . Left hemiparesis ([place])   . Cardiomyopathy, ischemic   . LV (left ventricular) mural thrombus following MI ([place])   . Myocardiopathy ([place])   . Mixed hyperlipidemia   . CVA (cerebral  vascular accident) ([place]) 07/02/2017  . Alcohol use 07/02/2017  . Anxiety 07/02/2017  . Depression 07/02/2017  . Acute hyperglycemia 07/02/2017  . Elevated blood-pressure reading without diagnosis of hypertension 07/02/2017  . ADHD 07/02/2017  . Cocaine abuse ([place]) 07/02/2017  . Marijuana abuse 06/20/2017  .  Cocaine use 06/20/2017    Quay Burow, OTR/L 09/04/2017, 12:42 PM  [facility] [address] [place], [place], [address] Phone: [phone]   Fax:  [phone]  Name: Angel Davies MRN: [id number] Date of Birth: Dec 06, 1970

## 2017-09-04 NOTE — Patient Instructions (Signed)
Graduated return to driving instructions were provided. It is recommended that the patient first drives with another licensed driver in an empty parking lot. If the patient does well with this, and they can drive on a quiet street with the licensed driver. If the patient does well with this they can drive on a busy street with a licensed driver. If the patient does well with this, the next time out they can go by himself. For the first month after resuming driving, I recommend no nighttime or Interstate driving.           Electronically signed by Erick ColaceKirsteins, Andrew E, MD at 09/03/2017 5:06 PM

## 2017-09-05 ENCOUNTER — Encounter: Payer: Self-pay | Admitting: Physical Therapy

## 2017-09-05 NOTE — Therapy (Signed)
[facility] [address] [place], [place], [address] Phone: [phone]   Fax:  [phone]  Physical Therapy Treatment  Patient Details  Name: Angel Davies MRN: [id number] Date of Birth: 04-28-1971 Referring Provider: Dr. Alysia Penna   Encounter Date: 09/04/2017  PT End of Session - 09/05/17 1432    Visit Number  8    Number of Visits  9    Date for PT Re-Evaluation  09/06/17    Authorization Type  [employer] - await info on visit limit    PT Start Time  1115 pt arrived 15" late for appt    PT Stop Time  1145    PT Time Calculation (min)  30 min       Past Medical History:  Diagnosis Date  . ADHD (attention deficit hyperactivity disorder)   . Anxiety   . CVA (cerebral vascular accident) ([place]) 07/02/2017   "left sided weakness" (07/02/2017)  . Depression   . History of kidney stones     Past Surgical History:  Procedure Laterality Date  . CYSTOSCOPY WITH URETEROSCOPY, STONE BASKETRY AND STENT PLACEMENT    . RIGHT/LEFT HEART CATH AND CORONARY ANGIOGRAPHY N/A 07/05/2017   Procedure: RIGHT/LEFT HEART CATH AND CORONARY ANGIOGRAPHY;  Surgeon: Jolaine Artist, MD;  Location: [facility];  Service: Cardiovascular;  Laterality: N/A;    There were no vitals filed for this visit.  Subjective Assessment - 09/05/17 1410    Subjective  Pt reports he has been walking alot and working on his gait;  wants to go to gym to workout    Pertinent History  Rt pontine CVA on 07-02-17: alcohol use; cocaine abuse:  anxiety/depression:  myocardiopathy;  acute systolic heart failure; s/p Rt/Lt heart cath & coronary angiography (07-05-17)    Patient Stated Goals  improve balance and gait; increase endurance    Currently in Pain?  No/denies                      [facility] Adult PT Treatment/Exercise - 09/05/17 0001      Ambulation/Gait   Ambulation/Gait  Yes    Ambulation/Gait Assistance  6: Modified independent  (Device/Increase time)    Ambulation/Gait Assistance Details  walked outside 10" for LTG assessment - no device used    Ambulation Distance (Feet)  550 Feet    Assistive device  None    Gait Pattern  Within Functional Limits    Ambulation Surface  Level;Indoor    Gait velocity  7.4 secs = 4.41 ft/sec no device      Knee/Hip Exercises: Stretches   Active Hamstring Stretch  Left;1 rep;30 seconds    Gastroc Stretch  Left;1 rep;30 seconds      Self Care - discussed progress towards goals; discussed possible need for cardiac rehab to address endurance problems and  decreased cardiovascular function; instructed pt to discuss this program with his cardiologist at appt. On 09-19-17  Pt agrees with this recommendation and would like to participate in this program if he qualifies            PT Long Term Goals - 09/04/17 1123      PT LONG TERM GOAL #1   Title  Increase gait velocity from 3.78 ft/sec to >/= 4.1 ft/sec for incr. gait efficiency.    Baseline  7.44= 4.41 ft/sec on 09-04-17    Status  Achieved      PT LONG TERM GOAL #2   Title  Pt will amb. for 10" nonstop with minimal c/o fatigue, i.e. </= 2/10 intensity of fatigue.    Baseline  met 09-04-17    Status  Achieved            Plan - 09/05/17 1433    Clinical Impression Statement  Pt is progressing well with gait and balance:  continues to c/o decr. endurance/stamina - recommend pt to discuss cardiac rehab with his cardiologist at upcoming appt on 09-19-17    Rehab Potential  Good    PT Frequency  2x / week    PT Duration  4 weeks    PT Treatment/Interventions  ADLs/Self Care Home Management;Gait training;Stair training;Therapeutic activities;Therapeutic exercise;Patient/family education;Neuromuscular re-education;Balance training    PT Next Visit Plan  check LTG's - D/C    PT Home Exercise Plan  LLE strengthening - hip ext., hamstrings, PF and DF LLE    Consulted and Agree with Plan of Care  Patient       Patient  will benefit from skilled therapeutic intervention in order to improve the following deficits and impairments:  Abnormal gait, Cardiopulmonary status limiting activity, Decreased activity tolerance, Decreased balance, Decreased cognition, Decreased endurance, Decreased coordination, Decreased strength, Impaired sensation, Impaired UE functional use, Other (comment)  Visit Diagnosis: Other abnormalities of gait and mobility     Problem List Patient Active Problem List   Diagnosis Date Noted  . Chronic systolic heart failure ([place]) 08/07/2017  . Encounter for therapeutic drug monitoring 07/27/2017  . Muscle pain   . Supratherapeutic INR   . Leukocytosis   . Coronary artery disease involving native coronary artery of native heart without angina pectoris   . Prediabetes   . Hemorrhoids   . Right pontine stroke ([place]) 07/06/2017  . Left hemiparesis ([place])   . Cardiomyopathy, ischemic   . LV (left ventricular) mural thrombus following MI ([place])   . Myocardiopathy ([place])   . Mixed hyperlipidemia   . CVA (cerebral vascular accident) ([place]) 07/02/2017  . Alcohol use 07/02/2017  . Anxiety 07/02/2017  . Depression 07/02/2017  . Acute hyperglycemia 07/02/2017  . Elevated blood-pressure reading without diagnosis of hypertension 07/02/2017  . ADHD 07/02/2017  . Cocaine abuse ([place]) 07/02/2017  . Marijuana abuse 06/20/2017  . Cocaine use 06/20/2017    [place], PT 09/05/2017, 2:40 PM  [facility] [address] [place], [place], [address] Phone: [phone]   Fax:  [phone]  Name: Angel Davies MRN: [id number] Date of Birth: [birth year]

## 2017-09-06 ENCOUNTER — Ambulatory Visit: Payer: Managed Care, Other (non HMO) | Admitting: Physical Therapy

## 2017-09-06 ENCOUNTER — Ambulatory Visit: Payer: Managed Care, Other (non HMO) | Admitting: Occupational Therapy

## 2017-09-06 ENCOUNTER — Encounter: Payer: Self-pay | Admitting: Occupational Therapy

## 2017-09-06 DIAGNOSIS — R293 Abnormal posture: Secondary | ICD-10-CM

## 2017-09-06 DIAGNOSIS — M6281 Muscle weakness (generalized): Secondary | ICD-10-CM

## 2017-09-06 DIAGNOSIS — R278 Other lack of coordination: Secondary | ICD-10-CM

## 2017-09-06 DIAGNOSIS — I69354 Hemiplegia and hemiparesis following cerebral infarction affecting left non-dominant side: Secondary | ICD-10-CM

## 2017-09-06 DIAGNOSIS — R2689 Other abnormalities of gait and mobility: Secondary | ICD-10-CM

## 2017-09-06 DIAGNOSIS — R2681 Unsteadiness on feet: Secondary | ICD-10-CM

## 2017-09-06 DIAGNOSIS — I69118 Other symptoms and signs involving cognitive functions following nontraumatic intracerebral hemorrhage: Secondary | ICD-10-CM

## 2017-09-06 NOTE — Patient Instructions (Addendum)
HIP / KNEE: Extension - Prone    Squeeze glutes. Raise leg up. Keep knee straight. _10__ reps per set, _3__ sets per day, _7__ days per week  BEND LEFT KNEE TO 90 degrees - keep knee bent and lift from the hip  PLACE PILLOW UNDER HIPS to prevent feeling it in low back  Copyright  VHI. All rights reserved.

## 2017-09-06 NOTE — Therapy (Signed)
[facility] [address] [place], [place], [address] Phone: [phone]   Fax:  [phone]  Occupational Therapy Treatment  Patient Details  Name: Angel Davies MRN: [id number] Date of Birth: 16-Nov-1970 Referring Provider: Dr. Claudette Laws   Encounter Date: 09/06/2017  OT End of Session - 09/06/17 1027    Visit Number  9    Number of Visits  16    Date for OT Re-Evaluation  10/01/17    Authorization Type  [employer] Managed - no auth await info on any visit limitations    OT Start Time  0932    OT Stop Time  1016    OT Time Calculation (min)  44 min    Activity Tolerance  Patient tolerated treatment well       Past Medical History:  Diagnosis Date  . ADHD (attention deficit hyperactivity disorder)   . Anxiety   . CVA (cerebral vascular accident) ([facility]) 07/02/2017   "left sided weakness" (07/02/2017)  . Depression   . History of kidney stones     Past Surgical History:  Procedure Laterality Date  . CYSTOSCOPY WITH URETEROSCOPY, STONE BASKETRY AND STENT PLACEMENT    . RIGHT/LEFT HEART CATH AND CORONARY ANGIOGRAPHY N/A 07/05/2017   Procedure: RIGHT/LEFT HEART CATH AND CORONARY ANGIOGRAPHY;  Surgeon: Dolores Patty, MD;  [facility]: [facility];  Service: Cardiovascular;  Laterality: N/A;    There were no vitals filed for this visit.  Subjective Assessment - 09/06/17 0935    Subjective   I have a lot of back pain when I first wake up and then sometimes during the day when I have been sitting for a period of time. Moving around helps it.     Pertinent History  Pt with R pontine CVA, ADHD, anxiety, depression, poly substance abuse, severe stenosis posterior territory, severe CAD, subacute termporoccipital CVA    Currently in Pain?  No/denies back pain when I first work up 7/10                   OT Treatments/Exercises (OP) - 09/06/17 0001      Neurological Re-education Exercises   Other  Exercises 1  Neuro re ed to address core strength with UE strength in supine as well as sitting and standing;  also addressed arm on body overhead movements with resistance with emphasis on active core with overhead activity. Upgraded HEP - see pt instructions for details. Pt able to return demonstrate all activities after instruction and practice.              OT Education - 09/06/17 1025    Education provided  Yes    Education Details  Upgraded HEP for LUE/core    Person(s) Educated  Patient    Methods  Explanation;Demonstration;Verbal cues;Handout    Comprehension  Verbalized understanding;Returned demonstration       OT Short Term Goals - 09/06/17 1025      OT SHORT TERM GOAL #1   Title  Pt will be mod I with HEP for LUE proximal strengthening, grip strength and coordination - 09/03/2017    Status  Achieved      OT SHORT TERM GOAL #2   Title  Pt will demonstrate improved grip strength by at least 8 pounds to assist with opening containers - baseline = 45    Status  On-going 09/04/2017  50 pounds      OT SHORT TERM GOAL #3   Title  Pt  will demonstrate improved coordination as evidenced by decreasing time on 9 hole peg by at least 10 seconds to assist with tying shoes, other fine motor tasks.    Baseline  53.38    Status  Achieved 09/04/2017 38.42      OT SHORT TERM GOAL #4   Title  Pt will be able to pick up 5 pound object and place in overhead cabinet x5 without dropping and with postural alignment     Status  Achieved      OT SHORT TERM GOAL #5   Title  Pt will tolerate at least 20 minutes of ambulatory or standing activity without rest break    Status  Achieved 09/04/2017  fatigue rated 3/10 and BP 110/72        OT Long Term Goals - 09/06/17 1025      OT LONG TERM GOAL #1   Title  Pt will be mod I with upgraded HEP prn - 10/01/3016    Status  On-going      OT LONG TERM GOAL #2   Title  Pt will demonstrate improve grip strength by at least 12 pounds to assist with using  LUE as non dominant in house mgmt tasks (baseline = 45)    Status  On-going      OT LONG TERM GOAL #3   Title  Pt will demonstrate improved coordination as evidenced by decreasing time on 9 hole peg by at least 18 seconds to assist with fine motor tasks (baseline= 53.38)    Status  On-going      OT LONG TERM GOAL #4   Title  Pt will tolerate at least 35 minutes of ambulatory or standing activity without rest breaks.     Status  On-going      OT LONG TERM GOAL #5   Title  Pt will demonstrate ability to lift 8 pound object into overhead cabinet x4 without dropping and with good postural alignment    Status  On-going      OT LONG TERM GOAL #6   Title  Pt will be mod I with putting and driving of golf ball on outdoor, uneven surfaces    Status  On-going            Plan - 09/06/17 1026    Clinical Impression Statement  Pt continues to progress toward goals.  Treatment focus to incorporate more active core control with LUE overhead activities.      Rehab Potential  Good    OT Frequency  2x / week    OT Duration  8 weeks    OT Treatment/Interventions  Self-care/ADL training;Aquatic Therapy;Moist Heat;Therapeutic exercise;Neuromuscular education;Functional Mobility Training;DME and/or AE instruction;Manual Therapy;Cognitive remediation/compensation;Therapeutic activities;Patient/family education;Balance training    Plan   NMR for LUE proximal control ,  resistive overhead reach, grip strength, coordination    Consulted and Agree with Plan of Care  Patient       Patient will benefit from skilled therapeutic intervention in order to improve the following deficits and impairments:  Abnormal gait, Decreased activity tolerance, Decreased balance, Decreased coordination, Decreased cognition, Decreased mobility, Difficulty walking, Decreased strength, Impaired UE functional use, Impaired sensation  Visit Diagnosis: Hemiplegia and hemiparesis following cerebral infarction affecting left  non-dominant side (HCC)  Muscle weakness (generalized)  Other lack of coordination  Unsteadiness on feet  Abnormal posture  Other symptoms and signs involving cognitive functions following nontraumatic intracerebral hemorrhage    Problem List Patient Active Problem List   Diagnosis Date  Noted  . Chronic systolic heart failure ([facility]) 08/07/2017  . Encounter for therapeutic drug monitoring 07/27/2017  . Muscle pain   . Supratherapeutic INR   . Leukocytosis   . Coronary artery disease involving native coronary artery of native heart without angina pectoris   . Prediabetes   . Hemorrhoids   . 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 Left hemiparesis ([facility])   . Cardiomyopathy, ischemic   . 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 Myocardiopathy ([facility])   . Mixed hyperlipidemia   . CVA (cerebral vascular accident) ([facility]) 07/02/2017  . Alcohol use 07/02/2017  . Anxiety 07/02/2017  . Depression 07/02/2017  . Acute hyperglycemia 07/02/2017  . Elevated blood-pressure reading without diagnosis of hypertension 07/02/2017  . ADHD 07/02/2017  . Cocaine abuse ([facility]) 07/02/2017  . Marijuana abuse 06/20/2017  . Cocaine use 06/20/2017    Norton Pastel, OTR/L 09/06/2017, 10:28 AM  [facility] [address] [place], [place], [address] Phone: [phone]   Fax:  [phone]  Name: Angel Davies MRN: [id number] Date of Birth: 10-27-1970

## 2017-09-06 NOTE — Patient Instructions (Signed)
Add this to your home program:  Do every day at least one time a day.  1. Lay on your back with knees bent on the floor.  Hold a 3 pound weight in your hand. Start with your hand at your shoulder.  Tuck your chin. Reach your hand across your body in a downward diagonal until your shoulder comes off the floor. Then return to starting position.  Do 12 then switch arms.  Rest then repeat (12 more each arm).    Stretches:  GENTLE!!!!  1.  Lay on your side and bend your knees up to your chest as far as you can - this shouldn't hurt but you should feel a stretch. Try to relax into the stretch.  Hold for a slow count of 20.  Then see if you can bring your knees up a little more and hold for another count of 20.  2. Roll onto your back. Using your arms to help, bring your knees up to your chest.  Slowly rock side to side.  Try and stay relaxed and do not use momentum to roll.  This should be gentle rocking.

## 2017-09-07 ENCOUNTER — Ambulatory Visit: Payer: Managed Care, Other (non HMO) | Admitting: Neurology

## 2017-09-07 ENCOUNTER — Encounter: Payer: Self-pay | Admitting: Physical Therapy

## 2017-09-07 NOTE — Therapy (Signed)
[facility] [address] [place], [place], [address] Phone: [phone]   Fax:  [phone]  Physical Therapy Treatment  Patient Details  Name: Angel Davies MRN: [id number] Date of Birth: May 04, 1971 Referring Provider: Dr. Alysia Penna   Encounter Date: 09/06/2017  PT End of Session - 09/07/17 1057    Visit Number  9    Number of Visits  15    Date for PT Re-Evaluation  10/04/17    Authorization Type  [employer] - await info on visit limit    PT Start Time  1018    PT Stop Time  1101    PT Time Calculation (min)  43 min       Past Medical History:  Diagnosis Date  . ADHD (attention deficit hyperactivity disorder)   . Anxiety   . CVA (cerebral vascular accident) ([place]) 07/02/2017   "left sided weakness" (07/02/2017)  . Depression   . History of kidney stones     Past Surgical History:  Procedure Laterality Date  . CYSTOSCOPY WITH URETEROSCOPY, STONE BASKETRY AND STENT PLACEMENT    . RIGHT/LEFT HEART CATH AND CORONARY ANGIOGRAPHY N/A 07/05/2017   Procedure: RIGHT/LEFT HEART CATH AND CORONARY ANGIOGRAPHY;  Surgeon: Jolaine Artist, MD;  Location: [facility] [facility];  Service: Cardiovascular;  Laterality: N/A;    There were no vitals filed for this visit.  Subjective Assessment - 09/07/17 1056    Subjective  Pt states he wants to go to [facility] and workout for cardio but knows he needs MD's clearance    Pertinent History  Rt pontine CVA on 07-02-17: alcohol use; cocaine abuse:  anxiety/depression:  myocardiopathy;  acute systolic heart failure; s/p Rt/Lt heart cath & coronary angiography (07-05-17)    Patient Stated Goals  improve balance and gait; increase endurance    Currently in Pain?  No/denies         [facility] PT Assessment - 09/07/17 0001      Functional Gait  Assessment   Gait assessed   Yes    Gait Level Surface  Walks 20 ft in less than 5.5 sec, no assistive devices, good speed, no evidence for  imbalance, normal gait pattern, deviates no more than 6 in outside of the 12 in walkway width.    Change in Gait Speed  Able to smoothly change walking speed without loss of balance or gait deviation. Deviate no more than 6 in outside of the 12 in walkway width.    Gait with Horizontal Head Turns  Performs head turns smoothly with no change in gait. Deviates no more than 6 in outside 12 in walkway width    Gait with Vertical Head Turns  Performs head turns with no change in gait. Deviates no more than 6 in outside 12 in walkway width.    Gait and Pivot Turn  Pivot turns safely within 3 sec and stops quickly with no loss of balance.    Step Over Obstacle  Is able to step over 2 stacked shoe boxes taped together (9 in total height) without changing gait speed. No evidence of imbalance.    Gait with Narrow Base of Support  Is able to ambulate for 10 steps heel to toe with no staggering.    Gait with Eyes Closed  Walks 20 ft, no assistive devices, good speed, no evidence of imbalance, normal gait pattern, deviates no more than 6 in outside 12 in walkway width. Ambulates 20 ft in less than 7 sec.  Ambulating Backwards  Walks 20 ft, no assistive devices, good speed, no evidence for imbalance, normal gait    Steps  Alternating feet, no rail.    Total Score  30       elliptical 1" forward and 1" backward level 1.5   Pt jogged 40' x 4 reps             [facility] Adult PT Treatment/Exercise - 09/07/17 0001      Ambulation/Gait   Ambulation/Gait  Yes    Ambulation/Gait Assistance  6: Modified independent (Device/Increase time)    Ambulation Distance (Feet)  125 Feet    Assistive device  None    Gait Pattern  Within Functional Limits    Ambulation Surface  Level;Indoor                  PT Long Term Goals - 09/06/17 1018      PT LONG TERM GOAL #1   Title  Increase gait velocity from 3.78 ft/sec to >/= 4.1 ft/sec for incr. gait efficiency.    Baseline  7.44= 4.41 ft/sec on 09-04-17     Status  Achieved      PT LONG TERM GOAL #2   Title  Pt will amb. for 10" nonstop with minimal c/o fatigue, i.e. </= 2/10 intensity of fatigue.    Baseline  met 09-04-17    Status  Achieved      PT LONG TERM GOAL #3   Title  Pt will increase FGA score by at least 5 points to demo improved balance with gait.    Baseline  score 30/30 on 09-06-17    Status  Achieved      PT LONG TERM GOAL #4   Title  Independent in HEP for LLE strengthening and balance.      PT LONG TERM GOAL #5   Title  Negotiate 4 steps without rail using a step over step sequence with supervision to demo improved balance.      PT LONG TERM GOAL #6   Title  Pt will demonstrate increased endurance by performing 10" cardiovascular exercise (including treadmill, elliptical and bike) with perceived exertion scale score of </= 5/10.    Baseline  Pt currently able to amb. 10" at  varying speeds on unlevel paved surface    Time  4    Period  Weeks    Status  New    Target Date  10/04/17      PT LONG TERM GOAL #7   Title  Assess DGI and set goal as appropriate.    Time  4    Period  Weeks    Status  New    Target Date  10/04/17      PT LONG TERM GOAL #8   Title  Pt will report at least 50% improvement in gait pattern by less deviations exhibited.    Time  4    Period  Weeks    Status  New    Target Date  10/04/17            Plan - 09/07/17 1104    Clinical Impression Statement  Pt has met 5/5 LTG's; he continues to demonstrate decreased endurance/activity tolerance and continues to have mild gait deviations due to LLE weakness.  Plan to continue with PT for 4 weeks for cardiovascular conditioning exercises and LLE strengthening to reduce gait deviations.  Rehab Potential  Good    PT Frequency  2x / week    PT Duration  4 weeks    PT Treatment/Interventions  ADLs/Self Care Home Management;Gait  training;Stair training;Therapeutic activities;Therapeutic exercise;Patient/family education;Neuromuscular re-education;Balance training    PT Next Visit Plan  cont endurance and strengthening exs    PT Home Exercise Plan  LLE strengthening - hip ext., hamstrings, PF and DF LLE    Consulted and Agree with Plan of Care  Patient       Patient will benefit from skilled therapeutic intervention in order to improve the following deficits and impairments:  Abnormal gait, Cardiopulmonary status limiting activity, Decreased activity tolerance, Decreased balance, Decreased cognition, Decreased endurance, Decreased coordination, Decreased strength, Impaired sensation, Impaired UE functional use, Other (comment)  Visit Diagnosis: Muscle weakness (generalized) - Plan: PT plan of care cert/re-cert  Other lack of coordination - Plan: PT plan of care cert/re-cert  Other abnormalities of gait and mobility - Plan: PT plan of care cert/re-cert     Problem List Patient Active Problem List   Diagnosis Date Noted  . Chronic systolic heart failure ([place]) 08/07/2017  . Encounter for therapeutic drug monitoring 07/27/2017  . Muscle pain   . Supratherapeutic INR   . Leukocytosis   . Coronary artery disease involving native coronary artery of native heart without angina pectoris   . Prediabetes   . Hemorrhoids   . Right pontine stroke ([place]) 07/06/2017  . Left hemiparesis ([place])   . Cardiomyopathy, ischemic   . LV (left ventricular) mural thrombus following MI ([place])   . Myocardiopathy ([place])   . Mixed hyperlipidemia   . CVA (cerebral vascular accident) ([place]) 07/02/2017  . Alcohol use 07/02/2017  . Anxiety 07/02/2017  . Depression 07/02/2017  . Acute hyperglycemia 07/02/2017  . Elevated blood-pressure reading without diagnosis of hypertension 07/02/2017  . ADHD 07/02/2017  . Cocaine abuse ([place]) 07/02/2017  . Marijuana abuse 06/20/2017  . Cocaine use 06/20/2017    [place], PT 09/07/2017,  11:32 AM  [facility] [address] [place], [place], [address] Phone: [phone]   Fax:  [phone]  Name: RYUN VELEZ MRN: [id number] Date of Birth: 26-Jun-1971

## 2017-09-11 ENCOUNTER — Encounter: Payer: Self-pay | Admitting: Occupational Therapy

## 2017-09-11 ENCOUNTER — Ambulatory Visit: Payer: Managed Care, Other (non HMO) | Admitting: Occupational Therapy

## 2017-09-11 DIAGNOSIS — R2681 Unsteadiness on feet: Secondary | ICD-10-CM

## 2017-09-11 DIAGNOSIS — I69118 Other symptoms and signs involving cognitive functions following nontraumatic intracerebral hemorrhage: Secondary | ICD-10-CM

## 2017-09-11 DIAGNOSIS — I69354 Hemiplegia and hemiparesis following cerebral infarction affecting left non-dominant side: Secondary | ICD-10-CM

## 2017-09-11 DIAGNOSIS — R2689 Other abnormalities of gait and mobility: Secondary | ICD-10-CM | POA: Diagnosis not present

## 2017-09-11 DIAGNOSIS — R293 Abnormal posture: Secondary | ICD-10-CM

## 2017-09-11 DIAGNOSIS — M6281 Muscle weakness (generalized): Secondary | ICD-10-CM

## 2017-09-11 DIAGNOSIS — R278 Other lack of coordination: Secondary | ICD-10-CM

## 2017-09-11 NOTE — Therapy (Deleted)
[facility] [address] [place], [place], [address] Phone: [phone]   Fax:  [phone]  Patient Details  Name: Angel Davies MRN: [id number] Date of Birth: 02/08/1971 Referring Provider:  Erick ColaceKirsteins, Andrew E, MD  Encounter Date: 09/11/2017   Collier SalinaGellert, Kristin M 09/11/2017, 2:03 PM  [facility] [address] [place], [place], [address] Phone: [phone]   Fax:  [phone]

## 2017-09-11 NOTE — Therapy (Signed)
[facility] [address] [place], [place], [address] Phone: [phone]   Fax:  [phone]  Occupational Therapy Treatment  Patient Details  Name: Angel Davies MRN: [id number] Date of Birth: 02/26/1971 Referring Provider: Dr. Claudette LawsAndrew Kirsteins   Encounter Date: 09/11/2017  OT End of Session - 09/11/17 1401    Visit Number  10    Number of Visits  16    Date for OT Re-Evaluation  10/01/17    Authorization Type  [employer] Managed - no auth await info on any visit limitations    OT Start Time  1317    OT Stop Time  1359    OT Time Calculation (min)  42 min    Activity Tolerance  Patient tolerated treatment well    Behavior During Therapy  [facility] for tasks assessed/performed       Past Medical History:  Diagnosis Date  . ADHD (attention deficit hyperactivity disorder)   . Anxiety   . CVA (cerebral vascular accident) ([facility]) 07/02/2017   "left sided weakness" (07/02/2017)  . Depression   . History of kidney stones     Past Surgical History:  Procedure Laterality Date  . CYSTOSCOPY WITH URETEROSCOPY, STONE BASKETRY AND STENT PLACEMENT    . RIGHT/LEFT HEART CATH AND CORONARY ANGIOGRAPHY N/A 07/05/2017   Procedure: RIGHT/LEFT HEART CATH AND CORONARY ANGIOGRAPHY;  Surgeon: Dolores PattyBensimhon, Daniel R, MD;  Location: [facility];  Service: Cardiovascular;  Laterality: N/A;    There were no vitals filed for this visit.  Subjective Assessment - 09/11/17 1325    Subjective   There are just some things I do with my right hand - like lifting a keg    Patient is accompained by:  Family member    Pertinent History  Pt with R pontine CVA, ADHD, anxiety, depression, poly substance abuse, severe stenosis posterior territory, severe CAD, subacute termporoccipital CVA    Currently in Pain?  No/denies    Pain Score  0-No pain                   OT Treatments/Exercises (OP) - 09/11/17 0001      Neurological Re-education  Exercises   Other Exercises 1  Neuro reeducation to address high reach, with core strengthening.  Patient with very good biomechanics for overhead reach.  Patient able to tolerate moderate resistance to overhead reach, although fatigues quickly - needs muscle sustainability in overhead positions.  Taught patient plank exercise - forearm.  Patient able to complete plank in good form with emphasis on shoulder stability.      Other Exercises 2  Worked on coordination timing - through juggling.  Patient could juggle before - now patient with difficulty grading direction and force for left hand toss.  Patient will obtain lightweight juggling balls and work on this at home.  Ball handling skills while walking to address gross motor coordiantion,               OT Education - 09/11/17 1401    Education provided  Yes    Education Details  alignment for plank exercise    Person(s) Educated  Patient    Methods  Explanation;Demonstration    Comprehension  Verbalized understanding;Returned demonstration       OT Short Term Goals - 09/06/17 1025      OT SHORT TERM GOAL #1   Title  Pt will be mod I with HEP for LUE proximal strengthening, grip strength and coordination -  09/03/2017    Status  Achieved      OT SHORT TERM GOAL #2   Title  Pt will demonstrate improved grip strength by at least 8 pounds to assist with opening containers - baseline = 45    Status  On-going 09/04/2017  50 pounds      OT SHORT TERM GOAL #3   Title  Pt will demonstrate improved coordination as evidenced by decreasing time on 9 hole peg by at least 10 seconds to assist with tying shoes, other fine motor tasks.    Baseline  53.38    Status  Achieved 09/04/2017 38.42      OT SHORT TERM GOAL #4   Title  Pt will be able to pick up 5 pound object and place in overhead cabinet x5 without dropping and with postural alignment     Status  Achieved      OT SHORT TERM GOAL #5   Title  Pt will tolerate at least 20 minutes of  ambulatory or standing activity without rest break    Status  Achieved 09/04/2017  fatigue rated 3/10 and BP 110/72        OT Long Term Goals - 09/06/17 1025      OT LONG TERM GOAL #1   Title  Pt will be mod I with upgraded HEP prn - 10/01/3016    Status  On-going      OT LONG TERM GOAL #2   Title  Pt will demonstrate improve grip strength by at least 12 pounds to assist with using LUE as non dominant in house mgmt tasks (baseline = 45)    Status  On-going      OT LONG TERM GOAL #3   Title  Pt will demonstrate improved coordination as evidenced by decreasing time on 9 hole peg by at least 18 seconds to assist with fine motor tasks (baseline= 53.38)    Status  On-going      OT LONG TERM GOAL #4   Title  Pt will tolerate at least 35 minutes of ambulatory or standing activity without rest breaks.     Status  On-going      OT LONG TERM GOAL #5   Title  Pt will demonstrate ability to lift 8 pound object into overhead cabinet x4 without dropping and with good postural alignment    Status  On-going      OT LONG TERM GOAL #6   Title  Pt will be mod I with putting and driving of golf ball on outdoor, uneven surfaces    Status  On-going            Plan - 09/11/17 1402    Clinical Impression Statement  Patint continues to show progress toward OT long term goals.      Occupational Profile and client history currently impacting functional performance  PMH:  ADHD, anxiety, depression, fall on 12/2016 with loss of peripheral vision and onset of HA, poly substance abuse, severe stenosis posterior terrritory, severe CAD,     Occupational performance deficits (Please refer to evaluation for details):  ADL's;IADL's;Work;Leisure;Social Participation    Rehab Potential  Good    OT Frequency  2x / week    OT Duration  8 weeks    OT Treatment/Interventions  Self-care/ADL training;Aquatic Therapy;Moist Heat;Therapeutic exercise;Neuromuscular education;Functional Mobility Training;DME and/or AE  instruction;Manual Therapy;Cognitive remediation/compensation;Therapeutic activities;Patient/family education;Balance training    Plan   NMR for LUE proximal control ,  resistive overhead reach, grip strength, coordination  Consulted and Agree with Plan of Care  Patient       Patient will benefit from skilled therapeutic intervention in order to improve the following deficits and impairments:  Abnormal gait, Decreased activity tolerance, Decreased balance, Decreased coordination, Decreased cognition, Decreased mobility, Difficulty walking, Decreased strength, Impaired UE functional use, Impaired sensation  Visit Diagnosis: Hemiplegia and hemiparesis following cerebral infarction affecting left non-dominant side ([facility])  Other lack of coordination  Unsteadiness on feet  Abnormal posture  Muscle weakness (generalized)  Other symptoms and signs involving cognitive functions following nontraumatic intracerebral hemorrhage    Problem List Patient Active Problem List   Diagnosis Date Noted  . Chronic systolic heart failure ([facility]) 08/07/2017  . Encounter for therapeutic drug monitoring 07/27/2017  . Muscle pain   . Supratherapeutic INR   . Leukocytosis   . Coronary artery disease involving native coronary artery of native heart without angina pectoris   . Prediabetes   . Hemorrhoids   . 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 Left hemiparesis ([facility])   . Cardiomyopathy, ischemic   . 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 Myocardiopathy ([facility])   . Mixed hyperlipidemia   . CVA (cerebral vascular accident) ([facility]) 07/02/2017  . Alcohol use 07/02/2017  . Anxiety 07/02/2017  . Depression 07/02/2017  . Acute hyperglycemia 07/02/2017  . Elevated blood-pressure reading without diagnosis of hypertension 07/02/2017  . ADHD 07/02/2017  . Cocaine abuse ([facility]) 07/02/2017  . Marijuana abuse 06/20/2017  . Cocaine use 06/20/2017    Collier Salina, OTR/L 09/11/2017,  2:04 PM  [facility] [address] [place], [place], [address] Phone: [phone]   Fax:  [phone]  Name: Angel Davies MRN: [id number] Date of Birth: Apr 04, 1971

## 2017-09-12 ENCOUNTER — Telehealth: Payer: Self-pay | Admitting: Neurology

## 2017-09-12 ENCOUNTER — Ambulatory Visit (INDEPENDENT_AMBULATORY_CARE_PROVIDER_SITE_OTHER): Payer: Managed Care, Other (non HMO) | Admitting: Pharmacist

## 2017-09-12 DIAGNOSIS — Z5181 Encounter for therapeutic drug level monitoring: Secondary | ICD-10-CM

## 2017-09-12 DIAGNOSIS — I255 Ischemic cardiomyopathy: Secondary | ICD-10-CM | POA: Diagnosis not present

## 2017-09-12 DIAGNOSIS — I236 Thrombosis of atrium, auricular appendage, and ventricle as current complications following acute myocardial infarction: Secondary | ICD-10-CM

## 2017-09-12 DIAGNOSIS — I6350 Cerebral infarction due to unspecified occlusion or stenosis of unspecified cerebral artery: Secondary | ICD-10-CM

## 2017-09-12 LAB — POCT INR: INR: 2.8

## 2017-09-12 NOTE — Patient Instructions (Signed)
Description   Continue same dose of coumadin 2mg (1 tablet) daily except Coumadin 3mg (1.5 tablets) on Saturdays.   Recheck INR in 2 weeks.      

## 2017-09-12 NOTE — Telephone Encounter (Signed)
I cannot answer any questions about his experience at the ED. But I advised him to change his lifestyle and told him that using cocaine could cause a stroke in November and he did not change his lifestyle, I also ordered an MRI of the brain in November and he did not have that completed either.

## 2017-09-12 NOTE — Telephone Encounter (Signed)
I received an internal  referral from Dr Doroteo BradfordKirstein dated 09/03/17. And in his note it say's patient has question's regarding stroke and prevention. The patient was last seen 08/20/17 by Darrol Angelarolyn Martin and is due to F/u in May. The patient had an appointment 09/07/17 but it was canceled per Dr Lucia GaskinsAhern. It say's patient seeing Eber JonesCarolyn per Dr Lucia GaskinsAhern spoke to pt over the phone. Should you or Dr Lucia GaskinsAhern call the patient to find out what is going on or should I schedule appt with Dr Lucia GaskinsAhern which wont be until April? Thanks Diane

## 2017-09-12 NOTE — Telephone Encounter (Addendum)
I spoke to pt and he has question about when [facility],  if this was thought to be possible stroke/TIa? Had normal CT, ? Should of had MRI?  He stated he would have changed his lifestyle to hopefully ward off the stroke that he did have in 06/2017.    I believe that is his question.  I note that in 05/2017 Dr. Lucia GaskinsAhern did mention in her note about risk factors for stroke.  (stop illicit drugs).  He apparently was still using in 06/2017 when admitted for stroke.  I think he had questions so Dr. Wynn BankerKirsteins referred him back over here.

## 2017-09-13 ENCOUNTER — Ambulatory Visit: Payer: Managed Care, Other (non HMO) | Admitting: Occupational Therapy

## 2017-09-13 DIAGNOSIS — R278 Other lack of coordination: Secondary | ICD-10-CM

## 2017-09-13 DIAGNOSIS — I69118 Other symptoms and signs involving cognitive functions following nontraumatic intracerebral hemorrhage: Secondary | ICD-10-CM

## 2017-09-13 DIAGNOSIS — M6281 Muscle weakness (generalized): Secondary | ICD-10-CM

## 2017-09-13 DIAGNOSIS — R2689 Other abnormalities of gait and mobility: Secondary | ICD-10-CM | POA: Diagnosis not present

## 2017-09-13 DIAGNOSIS — I69354 Hemiplegia and hemiparesis following cerebral infarction affecting left non-dominant side: Secondary | ICD-10-CM

## 2017-09-13 NOTE — Therapy (Signed)
[facility] [address] [place], [place], [address] Phone: [phone]   Fax:  [phone]  Occupational Therapy Treatment  Patient Details  Name: Angel Davies MRN: [id number] Date of Birth: [birth year] Referring Provider: Dr. Claudette Laws   Encounter Date: 09/13/2017    Past Medical History:  Diagnosis Date  . ADHD (attention deficit hyperactivity disorder)   . Anxiety   . CVA (cerebral vascular accident) (HCC) 07/02/2017   "left sided weakness" (07/02/2017)  . Depression   . History of kidney stones     Past Surgical History:  Procedure Laterality Date  . CYSTOSCOPY WITH URETEROSCOPY, STONE BASKETRY AND STENT PLACEMENT    . RIGHT/LEFT HEART CATH AND CORONARY ANGIOGRAPHY N/A 07/05/2017   Procedure: RIGHT/LEFT HEART CATH AND CORONARY ANGIOGRAPHY;  Surgeon: Dolores Patty, MD;  Location: [facility] IN[facility]AS[facility] CV [facility];  Service: Cardiovascular;  Laterality: N/A;    There were no vitals filed for this visit.  Subjective Assessment - 09/13/17 1319    Pertinent History  Pt with R pontine CVA, ADHD, anxiety, depression, poly substance abuse, severe stenosis posterior territory, severe CAD, subacute termporoccipital CVA    Currently in Pain?  Yes    Pain Score  7     Pain Location  Back    Pain Orientation  Lower    Pain Descriptors / Indicators  Aching;Tightness    Pain Type  Chronic pain    Pain Onset  1 to 4 weeks ago    Pain Frequency  Intermittent    Aggravating Factors   nighttime    Pain Relieving Factors  rest heat, stretches             Treatment:Quadraped/ tall kneeling while rolling physioball forwards and backwards, min v.c followed by Quadraped lifting alternate UE/LE for trunk control, UE strength, min facilitation Juggling 2 balls while ambulating min difficulty Rolling medium ball up vertical surface with LUE x5 reps for increased control. Mid range functional reach, to retrieve pegs and  place small pegs in vetical pegboard  while standing on non compliant surface, min difficulty/ v.c                 OT Short Term Goals - 09/06/17 1025      OT SHORT TERM GOAL #1   Title  Pt will be mod I with HEP for LUE proximal strengthening, grip strength and coordination - 09/03/2017    Status  Achieved      OT SHORT TERM GOAL #2   Title  Pt will demonstrate improved grip strength by at least 8 pounds to assist with opening containers - baseline = 45    Status  On-going 09/04/2017  50 pounds      OT SHORT TERM GOAL #3   Title  Pt will demonstrate improved coordination as evidenced by decreasing time on 9 hole peg by at least 10 seconds to assist with tying shoes, other fine motor tasks.    Baseline  53.38    Status  Achieved 09/04/2017 38.42      OT SHORT TERM GOAL #4   Title  Pt will be able to pick up 5 pound object and place in overhead cabinet x5 without dropping and with postural alignment     Status  Achieved      OT SHORT TERM GOAL #5   Title  Pt will tolerate at least 20 minutes of ambulatory or standing activity without rest break    Status  Achieved 09/04/2017  fatigue rated 3/10 and BP 110/72        OT Long Term Goals - 09/06/17 1025      OT LONG TERM GOAL #1   Title  Pt will be mod I with upgraded HEP prn - 10/01/3016    Status  On-going      OT LONG TERM GOAL #2   Title  Pt will demonstrate improve grip strength by at least 12 pounds to assist with using LUE as non dominant in house mgmt tasks (baseline = 45)    Status  On-going      OT LONG TERM GOAL #3   Title  Pt will demonstrate improved coordination as evidenced by decreasing time on 9 hole peg by at least 18 seconds to assist with fine motor tasks (baseline= 53.38)    Status  On-going      OT LONG TERM GOAL #4   Title  Pt will tolerate at least 35 minutes of ambulatory or standing activity without rest breaks.     Status  On-going      OT LONG TERM GOAL #5   Title  Pt will demonstrate ability  to lift 8 pound object into overhead cabinet x4 without dropping and with good postural alignment    Status  On-going      OT LONG TERM GOAL #6   Title  Pt will be mod I with putting and driving of golf ball on outdoor, uneven surfaces    Status  On-going            Plan - 09/13/17 1321    Clinical Impression Statement  Patient continues to progress towards goals. Pt reports juggling at home.    Rehab Potential  Good    OT Frequency  2x / week    OT Duration  8 weeks    OT Treatment/Interventions  Self-care/ADL training;Aquatic Therapy;Moist Heat;Therapeutic exercise;Neuromuscular education;Functional Mobility Training;DME and/or AE instruction;Manual Therapy;Cognitive remediation/compensation;Therapeutic activities;Patient/family education;Balance training    Plan   NMR for LUE proximal control ,  resistive overhead reach, grip strength, coordination    Consulted and Agree with Plan of Care  Patient       Patient will benefit from skilled therapeutic intervention in order to improve the following deficits and impairments:  Abnormal gait, Decreased activity tolerance, Decreased balance, Decreased coordination, Decreased cognition, Decreased mobility, Difficulty walking, Decreased strength, Impaired UE functional use, Impaired sensation  Visit Diagnosis: Other lack of coordination  Hemiplegia and hemiparesis following cerebral infarction affecting left non-dominant side (HCC)  Muscle weakness (generalized)  Other symptoms and signs involving cognitive functions following nontraumatic intracerebral hemorrhage    Problem List Patient Active Problem List   Diagnosis Date Noted  . Chronic systolic heart failure (HCC) 08/07/2017  . Encounter for therapeutic drug monitoring 07/27/2017  . Muscle pain   . Supratherapeutic INR   . Leukocytosis   . Coronary artery disease involving native coronary artery of native heart without angina pectoris   . Prediabetes   . Hemorrhoids   .  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 Left hemiparesis (HCC)   . Cardiomyopathy, ischemic   . 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 Myocardiopathy (HCC)   . Mixed hyperlipidemia   . CVA (cerebral vascular accident) (HCC) 07/02/2017  . Alcohol use 07/02/2017  . Anxiety 07/02/2017  . Depression 07/02/2017  . Acute hyperglycemia 07/02/2017  . Elevated blood-pressure reading without diagnosis of hypertension 07/02/2017  . ADHD 07/02/2017  . Cocaine abuse (  HCC) 07/02/2017  . Marijuana abuse 06/20/2017  . Cocaine use 06/20/2017    Briya Lookabaugh 09/13/2017, 1:36 PM  [facility] [address] [place], [place], [address] Phone: [phone]   Fax:  [phone]  Name: JAMEIR AKE MRN: [id number] Date of Birth: 06/16/1971

## 2017-09-13 NOTE — Telephone Encounter (Signed)
I relayed to pt, message that Dr. Lucia GaskinsAhern stated that she cannot answer questions about his experience in ED.  I stated this twice.  I did relay that Dr. Lucia GaskinsAhern did address when he was in the office in 05/2017 the risk factors for stroke, and this included using cocaine, illicit drugs, and to manage risk factors such as blood pressure, weight, cholesterol, diet, exercise and any other risk factors such as smoking.   He asked about how Dr. Wynn BankerKirsteins knew to send referral to us.  I explained that the stroke MD[facility] at [facility] are physcians here and are sent to f/u here.  [facility] so Dr. Doroteo BradfordKirstein was able to see in Epic that we had see pt previously.

## 2017-09-13 NOTE — Telephone Encounter (Signed)
LMVM for pt to return call.   

## 2017-09-18 ENCOUNTER — Telehealth: Payer: Self-pay | Admitting: *Deleted

## 2017-09-18 ENCOUNTER — Ambulatory Visit: Payer: Managed Care, Other (non HMO) | Admitting: Occupational Therapy

## 2017-09-18 ENCOUNTER — Encounter: Payer: Self-pay | Admitting: Occupational Therapy

## 2017-09-18 DIAGNOSIS — R2689 Other abnormalities of gait and mobility: Secondary | ICD-10-CM | POA: Diagnosis not present

## 2017-09-18 DIAGNOSIS — M6281 Muscle weakness (generalized): Secondary | ICD-10-CM

## 2017-09-18 DIAGNOSIS — R278 Other lack of coordination: Secondary | ICD-10-CM

## 2017-09-18 DIAGNOSIS — I69354 Hemiplegia and hemiparesis following cerebral infarction affecting left non-dominant side: Secondary | ICD-10-CM

## 2017-09-18 DIAGNOSIS — R2681 Unsteadiness on feet: Secondary | ICD-10-CM

## 2017-09-18 DIAGNOSIS — I69118 Other symptoms and signs involving cognitive functions following nontraumatic intracerebral hemorrhage: Secondary | ICD-10-CM

## 2017-09-18 DIAGNOSIS — R293 Abnormal posture: Secondary | ICD-10-CM

## 2017-09-18 NOTE — Therapy (Signed)
[facility] [address] [place], [place], [address] Phone: [phone]   Fax:  [phone]  Occupational Therapy Treatment  Patient Details  Name: Angel Davies MRN: [id number] Date of Birth: 08-20-1970 Referring Provider: Dr. Alysia Penna   Encounter Date: 09/18/2017  OT End of Session - 09/18/17 1539    Visit Number  12    Number of Visits  16    Date for OT Re-Evaluation  10/01/17    Authorization Type  [employer] Managed - no auth await info on any visit limitations    OT Start Time  1316    OT Stop Time  1400    OT Time Calculation (min)  44 min    Activity Tolerance  Patient tolerated treatment well       Past Medical History:  Diagnosis Date  . ADHD (attention deficit hyperactivity disorder)   . Anxiety   . CVA (cerebral vascular accident) ([place]) 07/02/2017   "left sided weakness" (07/02/2017)  . Depression   . History of kidney stones     Past Surgical History:  Procedure Laterality Date  . CYSTOSCOPY WITH URETEROSCOPY, STONE BASKETRY AND STENT PLACEMENT    . RIGHT/LEFT HEART CATH AND CORONARY ANGIOGRAPHY N/A 07/05/2017   Procedure: RIGHT/LEFT HEART CATH AND CORONARY ANGIOGRAPHY;  Surgeon: Jolaine Artist, MD;  Location: [place] CV LAB;  Service: Cardiovascular;  Laterality: N/A;    There were no vitals filed for this visit.  Subjective Assessment - 09/18/17 1322    Subjective   I feel really good    Pertinent History  Pt with R pontine CVA, ADHD, anxiety, depression, poly substance abuse, severe stenosis posterior territory, severe CAD, subacute termporoccipital CVA    Currently in Pain?  No/denies                   OT Treatments/Exercises (OP) - 09/18/17 0001      ADLs   ADL Comments  Assessed remaining LTG's - see goal section for details. Pt has met or exceeded all goals and is ready for discharge. Pt in agreement.      Neurological Re-education Exercises   Other  Exercises 1  Neuro re ed to address dynamic standing balance on outdoor uneven surfaces while putting golf ball - pt able to complete and able to control the putter.  Pt states he feels his only real difficulty now is his overall endurance which PT is addressing.                OT Short Term Goals - 09/18/17 1537      OT SHORT TERM GOAL #1   Title  Pt will be mod I with HEP for LUE proximal strengthening, grip strength and coordination - 09/03/2017    Status  Achieved      OT SHORT TERM GOAL #2   Title  Pt will demonstrate improved grip strength by at least 8 pounds to assist with opening containers - baseline = 45    Status  Achieved 09/18/2017  60 pounds      OT SHORT TERM GOAL #3   Title  Pt will demonstrate improved coordination as evidenced by decreasing time on 9 hole peg by at least 10 seconds to assist with tying shoes, other fine motor tasks.    Baseline  53.38    Status  Achieved 09/04/2017 38.42      OT SHORT TERM GOAL #4   Title  Pt will be able to  pick up 5 pound object and place in overhead cabinet x5 without dropping and with postural alignment     Status  Achieved      OT SHORT TERM GOAL #5   Title  Pt will tolerate at least 20 minutes of ambulatory or standing activity without rest break    Status  Achieved 09/04/2017  fatigue rated 3/10 and BP 110/72        OT Long Term Goals - 09/18/17 1538      OT LONG TERM GOAL #1   Title  Pt will be mod I with upgraded HEP prn - 10/01/3016    Status  Achieved      OT LONG TERM GOAL #2   Title  Pt will demonstrate improve grip strength by at least 12 pounds to assist with using LUE as non dominant in house mgmt tasks (baseline = 45)    Status  Achieved 09/18/2017  60 pounds      OT LONG TERM GOAL #3   Title  Pt will demonstrate improved coordination as evidenced by decreasing time on 9 hole peg by at least 18 seconds to assist with fine motor tasks (baseline= 53.38)    Status  Achieved 09/18/2017  32.02 seconds      OT  LONG TERM GOAL #4   Title  Pt will tolerate at least 35 minutes of ambulatory or standing activity without rest breaks.     Status  Achieved      OT LONG TERM GOAL #5   Title  Pt will demonstrate ability to lift 8 pound object into overhead cabinet x4 without dropping and with good postural alignment    Status  Achieved      OT LONG TERM GOAL #6   Title  Pt will be mod I with putting and driving of golf ball on outdoor, uneven surfaces    Status  Achieved            Plan - 09/18/17 1538    Clinical Impression Statement  Pt has met all goals and is ready for discharge from OT. Pt in agreement.     Rehab Potential  Good    OT Frequency  2x / week    OT Duration  8 weeks    OT Treatment/Interventions  Self-care/ADL training;Aquatic Therapy;Moist Heat;Therapeutic exercise;Neuromuscular education;Functional Mobility Training;DME and/or AE instruction;Manual Therapy;Cognitive remediation/compensation;Therapeutic activities;Patient/family education;Balance training    Plan  d/c from [place] and Agree with Plan of Care  Patient       Patient will benefit from skilled therapeutic intervention in order to improve the following deficits and impairments:  Abnormal gait, Decreased activity tolerance, Decreased balance, Decreased coordination, Decreased cognition, Decreased mobility, Difficulty walking, Decreased strength, Impaired UE functional use, Impaired sensation  Visit Diagnosis: Other lack of coordination  Hemiplegia and hemiparesis following cerebral infarction affecting left non-dominant side (HCC)  Muscle weakness (generalized)  Other symptoms and signs involving cognitive functions following nontraumatic intracerebral hemorrhage  Unsteadiness on feet  Abnormal posture    Problem List Patient Active Problem List   Diagnosis Date Noted  . Chronic systolic heart failure ([place]) 08/07/2017  . Encounter for therapeutic drug monitoring 07/27/2017  . Muscle pain   .  Supratherapeutic INR   . Leukocytosis   . Coronary artery disease involving native coronary artery of native heart without angina pectoris   . Prediabetes   . Hemorrhoids   . Right pontine stroke ([place]) 07/06/2017  . Left hemiparesis ([name])   .  Cardiomyopathy, ischemic   . LV (left ventricular) mural thrombus following MI ([place])   . Myocardiopathy ([employer])   . Mixed hyperlipidemia   . CVA (cerebral vascular accident) ([place]) 07/02/2017  . Alcohol use 07/02/2017  . Anxiety 07/02/2017  . Depression 07/02/2017  . Acute hyperglycemia 07/02/2017  . Elevated blood-pressure reading without diagnosis of hypertension 07/02/2017  . ADHD 07/02/2017  . Cocaine abuse ([place]) 07/02/2017  . Marijuana abuse 06/20/2017  . Cocaine use 06/20/2017   OCCUPATIONAL THERAPY DISCHARGE SUMMARY  Visits from Start of Care: 12  Current functional level related to goals / functional outcomes: See above   Remaining deficits: Mild incoordination L hand, higher level activity tolerance (PT addressing)   Education / Equipment: HEP Plan: Patient agrees to discharge.  Patient goals were met. Patient is being discharged due to meeting the stated rehab goals.  ?????      Quay Burow, OTR/L 09/18/2017, 3:40 PM  [place] [address] [place], [place], [address] Phone: [phone]   Fax:  [phone]  Name: LORAN FLEET MRN: [id number] Date of Birth: 06/08/1971

## 2017-09-18 NOTE — Telephone Encounter (Signed)
-----   Message from Erick ColaceAndrew E Kirsteins, MD sent at 09/18/2017  4:21 PM EST ----- Please place this as a telephone encounter it allows me to place the order simply I think sac already talked to somebody at neuro rehab about this issue ----- Message ----- From: Jonna MunroPulaski, Karen H, OT Sent: 09/18/2017   1:53 PM To: Erick ColaceAndrew E Kirsteins, MD  Dr. Kirtland BouchardK, This patient is still experiencing memory issues.  If you agree could you please order ST eval and tx?  Thanks Clydie BraunKaren P

## 2017-09-19 ENCOUNTER — Ambulatory Visit (HOSPITAL_COMMUNITY)
Admission: RE | Admit: 2017-09-19 | Discharge: 2017-09-19 | Disposition: A | Discharge status: Home / Self Care | Payer: Managed Care, Other (non HMO) | Source: Ambulatory Visit | Attending: Internal Medicine | Admitting: Internal Medicine

## 2017-09-19 ENCOUNTER — Other Ambulatory Visit: Payer: Self-pay

## 2017-09-19 ENCOUNTER — Encounter (HOSPITAL_COMMUNITY): Payer: Self-pay | Admitting: Internal Medicine

## 2017-09-19 VITALS — BP 120/82 | HR 83 | Wt 205.8 lb

## 2017-09-19 DIAGNOSIS — Z8042 Family history of malignant neoplasm of prostate: Secondary | ICD-10-CM | POA: Insufficient documentation

## 2017-09-19 DIAGNOSIS — Z7901 Long term (current) use of anticoagulants: Secondary | ICD-10-CM | POA: Insufficient documentation

## 2017-09-19 DIAGNOSIS — F909 Attention-deficit hyperactivity disorder, unspecified type: Secondary | ICD-10-CM | POA: Insufficient documentation

## 2017-09-19 DIAGNOSIS — E785 Hyperlipidemia, unspecified: Secondary | ICD-10-CM | POA: Diagnosis not present

## 2017-09-19 DIAGNOSIS — I2510 Atherosclerotic heart disease of native coronary artery without angina pectoris: Secondary | ICD-10-CM | POA: Diagnosis not present

## 2017-09-19 DIAGNOSIS — I255 Ischemic cardiomyopathy: Secondary | ICD-10-CM | POA: Diagnosis not present

## 2017-09-19 DIAGNOSIS — Z800 Family history of malignant neoplasm of digestive organs: Secondary | ICD-10-CM | POA: Diagnosis not present

## 2017-09-19 DIAGNOSIS — Z87891 Personal history of nicotine dependence: Secondary | ICD-10-CM | POA: Insufficient documentation

## 2017-09-19 DIAGNOSIS — F329 Major depressive disorder, single episode, unspecified: Secondary | ICD-10-CM | POA: Diagnosis not present

## 2017-09-19 DIAGNOSIS — F419 Anxiety disorder, unspecified: Secondary | ICD-10-CM | POA: Insufficient documentation

## 2017-09-19 DIAGNOSIS — I110 Hypertensive heart disease with heart failure: Secondary | ICD-10-CM | POA: Diagnosis not present

## 2017-09-19 DIAGNOSIS — Z8673 Personal history of transient ischemic attack (TIA), and cerebral infarction without residual deficits: Secondary | ICD-10-CM | POA: Insufficient documentation

## 2017-09-19 DIAGNOSIS — I5022 Chronic systolic (congestive) heart failure: Secondary | ICD-10-CM | POA: Diagnosis not present

## 2017-09-19 DIAGNOSIS — F1410 Cocaine abuse, uncomplicated: Secondary | ICD-10-CM | POA: Diagnosis not present

## 2017-09-19 DIAGNOSIS — E669 Obesity, unspecified: Secondary | ICD-10-CM | POA: Insufficient documentation

## 2017-09-19 DIAGNOSIS — Z87442 Personal history of urinary calculi: Secondary | ICD-10-CM | POA: Insufficient documentation

## 2017-09-19 DIAGNOSIS — F1010 Alcohol abuse, uncomplicated: Secondary | ICD-10-CM | POA: Insufficient documentation

## 2017-09-19 DIAGNOSIS — I639 Cerebral infarction, unspecified: Secondary | ICD-10-CM | POA: Diagnosis not present

## 2017-09-19 DIAGNOSIS — Z79899 Other long term (current) drug therapy: Secondary | ICD-10-CM | POA: Diagnosis not present

## 2017-09-19 DIAGNOSIS — R200 Anesthesia of skin: Secondary | ICD-10-CM | POA: Diagnosis not present

## 2017-09-19 DIAGNOSIS — Z823 Family history of stroke: Secondary | ICD-10-CM | POA: Diagnosis not present

## 2017-09-19 LAB — BASIC METABOLIC PANEL
Anion gap: 10 (ref 5–15)
BUN: 16 mg/dL (ref 6–20)
CALCIUM: 9.6 mg/dL (ref 8.9–10.3)
CO2: 23 mmol/L (ref 22–32)
CREATININE: 1.17 mg/dL (ref 0.61–1.24)
Chloride: 106 mmol/L (ref 101–111)
Glucose, Bld: 96 mg/dL (ref 65–99)
Potassium: 3.9 mmol/L (ref 3.5–5.1)
SODIUM: 139 mmol/L (ref 135–145)

## 2017-09-19 MED ORDER — SACUBITRIL-VALSARTAN 49-51 MG PO TABS: 1.0000 | ORAL_TABLET | Freq: Two times a day (BID) | ORAL | 3 refills | Status: DC

## 2017-09-19 NOTE — Patient Instructions (Signed)
Increase Entresto to 49/51 mg Twice daily   Lab today  Your physician recommends that you schedule a follow-up appointment in: 3 months with echocardiogram

## 2017-09-19 NOTE — Progress Notes (Signed)
Advanced Heart Failure Clinic Note   Primary Care: Dr. Susa Simmonds Primary Cardiologist: Dr. Gala Romney  HPI:  Angel Davies is a 47 y.o. male with PMH of HTN, ADHD, Obesity, anxiety, substance abuse, and depression.   Pt presented 07/02/2017 with CVA. UDS + for cocaine. Found to have new systolic CHF by echo.   Echo 07/02/17 with LVEF 20-25%, grade 2 DD, Trivial MR, Trivial TR -> HF team consulted.  With new CHF, pt underwent R/LHC as below that showed severe 3vD with no options for CABG/CAD. HF meds titrated as tolerated.  Admitted to CIR from 12/7 - 07/27/17 with marked improvement. Med titration somewhat limited due to soft pressures.   He presents today for HF follow up. Carvedilol increased at last appointment.Continues to improve. Stroke deficits much improved.. Graduated from OT and ST. Still working with PT. Left side still slightly weaker and some numbness in left hand. No cane anymore. Walking at indoor track. No SOB with moderate activity or stairs No edema, orthopnea or PND.  No dizziness. 2 glasses of wine since he left the hospital. No drug use. Avoids salty foods. Never misses meds.   Bedside echo done today EF 30-35%  Review of systems complete and found to be negative unless listed in HPI.    cMRI on 07/06/17 with 1) Severe LVE with EF 18% septal and apical akinesis diffuse hypokinesis 2) Apical thrombus measuring 11 mm x 8 mm 3) Full thickness scar involving the mid and distal septum and apex Subendocardial scar involving the basal inferior lateral wall  R/LHC 07/05/17  Mid RCA to Dist RCA lesion is 100% stenosed.  Dist Cx lesion is 100% stenosed.  Mid LAD lesion is 100% stenosed.  Prox Cx to Dist Cx lesion is 30% stenosed.  Findings:  Ao = 104/68 (84) LV = 110/9 RA = 1 RV = 19/3 PA = 19/9 (11) PCW = 5 Fick cardiac output/index = 4.3/2.1 PVR = 1.5 WU SVR = 2122 Ao sat = 96% PA sat = 64%, 66%  Assessment: 1. Severe CAD with left-dominant system and  occluded mLAD 2. Ischemic CM with EF 25% by echo 3. Well compensated filling pressures with moderately reduced cardiac output  Past Medical History:  Diagnosis Date  . ADHD (attention deficit hyperactivity disorder)   . Anxiety   . CVA (cerebral vascular accident) (HCC) 07/02/2017   "left sided weakness" (07/02/2017)  . Depression   . History of kidney stones    Current Outpatient Medications  Medication Sig Dispense Refill  . acetaminophen (TYLENOL) 500 MG tablet Take 1 tablet (500 mg total) by mouth every 6 (six) hours as needed for headache (pain). 30 tablet 0  . atorvastatin (LIPITOR) 80 MG tablet Take 1 tablet (80 mg total) by mouth daily at 6 PM. 30 tablet 5  . carvedilol (COREG) 6.25 MG tablet Take 1 tablet (6.25 mg total) by mouth 2 (two) times daily with a meal. 60 tablet 5  . digoxin (LANOXIN) 0.125 MG tablet Take 1 tablet (0.125 mg total) by mouth daily. 30 tablet 5  . fenofibrate 160 MG tablet Take 1 tablet (160 mg total) by mouth daily. 30 tablet 5  . folic acid (FOLVITE) 1 MG tablet Take 1 tablet (1 mg total) by mouth daily. 30 tablet 0  . Multiple Vitamin (MULTIVITAMIN WITH MINERALS) TABS tablet Take 1 tablet by mouth daily. 100 tablet 0  . nicotine (NICODERM CQ - DOSED IN MG/24 HOURS) 14 mg/24hr patch Place 1 patch (14 mg total) onto  the skin daily. 28 patch 0  . sacubitril-valsartan (ENTRESTO) 24-26 MG Take 1 tablet by mouth 2 (two) times daily. 60 tablet 6  . spironolactone (ALDACTONE) 25 MG tablet Take 0.5 tablets (12.5 mg total) by mouth at bedtime. 30 tablet 5  . thiamine 100 MG tablet Take 1 tablet (100 mg total) by mouth daily. 30 tablet 0  . traZODone (DESYREL) 50 MG tablet Take 100-150 mg by mouth at bedtime as needed for sleep.    Marland Kitchen. warfarin (COUMADIN) 2 MG tablet Take one pill with supper on all days except Saturday. Take one and a half pill on Saturdays. 35 tablet 0   No current facility-administered medications for this encounter.    No Known  Allergies  Social History   Socioeconomic History  . Marital status: Married    Spouse name: Not on file  . Number of children: Not on file  . Years of education: Not on file  . Highest education level: Not on file  Social Needs  . Financial resource strain: Not on file  . Food insecurity - worry: Not on file  . Food insecurity - inability: Not on file  . Transportation needs - medical: Not on file  . Transportation needs - non-medical: Not on file  Occupational History  . Not on file  Tobacco Use  . Smoking status: Former Smoker    Packs/day: 0.00    Years: 0.00    Pack years: 0.00  . Smokeless tobacco: Never Used  Substance and Sexual Activity  . Alcohol use: Yes    Alcohol/week: 2.4 oz    Types: 4 Glasses of wine per week  . Drug use: Yes    Types: Marijuana, "Crack" cocaine    Comment: 07/02/2017 "smokes marijuana a couple days/week" , "08/20/17 vapes marijuana juice"  . Sexual activity: Yes  Other Topics Concern  . Not on file  Social History Narrative  . Not on file   Family History  Problem Relation Age of Onset  . Stroke Mother   . Colon cancer Mother   . Prostate cancer Father   . Seizures Neg Hx    Vitals:   09/19/17 1448  BP: 120/82  Pulse: 83  SpO2: 95%  Weight: 205 lb 12.8 oz (93.4 kg)   Wt Readings from Last 3 Encounters:  09/19/17 205 lb 12.8 oz (93.4 kg)  08/27/17 195 lb (88.5 kg)  08/20/17 200 lb (90.7 kg)   PHYSICAL EXAM: General: Well appearing. No resp difficulty. HEENT: Normal Neck: Supple. No JVD. Carotids 2+ bilat; no bruits. No thyromegaly or nodule noted. Cor: PMI nondisplaced. RRR, No M/G/R noted Lungs: CTAB, normal effort. Abdomen: Soft, non-tender, non-distended, no HSM. No bruits or masses. +BS  Extremities: No cyanosis, clubbing, or rash. R and LLE no edema.  Neuro: Alert & orientedx3, cranial nerves grossly intact. moves all 4 extremities w/o difficulty. Slightly weak in LUE Affect pleasant   ASSESSMENT & PLAN:  1.  Chronic systolic CHF, ICM - Echo 07/02/17 with LVEF 20-25%, grade 2 DD, Trivial MR, Trivial TR - cMRI EF 18% - Bedside echo 30-35% - done personally today (improving) - NYHA II symptoms with limited mobility.  - Volume status stable on exam.  - Continue spiro 12.5 mg daily.  - Increase Entresto to 49/51 mg BID.  - Continue digoxin 0.125 mg daily. Dig level 0.5 on 08/07/17. Consider discontinuing at next visit. - Continue coreg 6.25 mg BID.  - Will need repeat echo March or April with med optimization  to consider for ICD.   3. CAD - LHC 07/05/17 with severe CAD with left-dominant system and occluded mLAD. - Mostly distal CAD. No good targets for CABG or PCI. Continue medical therapy. Based on scar pattern on MRI, We are hopeful EF will improve at least moderately   - Continue statin. No ASA with coumadin - No s/s ischemia  3. CVA - MRI Head 07/02/17 R pontine infarct and sub acute R temporal occipital/PCA territory infarct, along with old left occipital lobe/PCA territory infarct. - Remains on coumadin. INR per coumadin clinic. - Continues to improve.   4. Cocaine abuse - Continues to be abstinent. Stressed importance of abstinence moving forward.  5. Alcohol abuse - 2 glasses of wine since recent hospitalization. Stressed importance of abstinence moving forward.   6. HLD - Continue atorvastatin 80 mg daily.  - Recheck lipids at follow up  Alford Highland, NP 09/19/17 3:06 PM  Patient seen and examined with the above-signed Advanced Practice Provider and/or Housestaff. I personally reviewed laboratory data, imaging studies and relevant notes. I independently examined the patient and formulated the important aspects of the plan. I have edited the note to reflect any of my changes or salient points. I have personally discussed the plan with the patient and/or family.  Continue to improve from functional perspective. NYHA II. No s/s angina. Bedside echo done today with EF ~30-35%  (improving). Agree with titrating Entresto. Will need repeat echo to decide on need for ICD. Continue warfarin/statin for presumed cardio-embolic CVA.  Arvilla Meres, MD  11:18 PM

## 2017-09-20 ENCOUNTER — Ambulatory Visit: Payer: Managed Care, Other (non HMO) | Admitting: Physical Therapy

## 2017-09-20 ENCOUNTER — Encounter: Payer: Managed Care, Other (non HMO) | Admitting: Occupational Therapy

## 2017-09-20 DIAGNOSIS — R278 Other lack of coordination: Secondary | ICD-10-CM

## 2017-09-20 DIAGNOSIS — M6281 Muscle weakness (generalized): Secondary | ICD-10-CM

## 2017-09-20 DIAGNOSIS — R2689 Other abnormalities of gait and mobility: Secondary | ICD-10-CM | POA: Diagnosis not present

## 2017-09-20 DIAGNOSIS — R2681 Unsteadiness on feet: Secondary | ICD-10-CM

## 2017-09-21 ENCOUNTER — Encounter: Payer: Self-pay | Admitting: Physical Therapy

## 2017-09-21 ENCOUNTER — Other Ambulatory Visit: Payer: Self-pay

## 2017-09-21 NOTE — Therapy (Addendum)
[facility] [address] [place], [place], [address] Phone: [phone]   Fax:  [phone]  Physical Therapy Treatment  Patient Details  Name: Angel Davies MRN: [id number] Date of Birth: 08/07/1970 Referring Provider: Dr. Alysia Penna   Encounter Date: 09/20/2017  PT End of Session - 09/21/17 1339    Visit Number  10    Number of Visits  15    Date for PT Re-Evaluation  10/04/17    Authorization Type  [employer] - await info on visit limit    PT Start Time  1402    PT Stop Time  1446    PT Time Calculation (min)  44 min       Past Medical History:  Diagnosis Date  . ADHD (attention deficit hyperactivity disorder)   . Anxiety   . CVA (cerebral vascular accident) ([place]) 07/02/2017   "left sided weakness" (07/02/2017)  . Depression   . History of kidney stones     Past Surgical History:  Procedure Laterality Date  . CYSTOSCOPY WITH URETEROSCOPY, STONE BASKETRY AND STENT PLACEMENT    . RIGHT/LEFT HEART CATH AND CORONARY ANGIOGRAPHY N/A 07/05/2017   Procedure: RIGHT/LEFT HEART CATH AND CORONARY ANGIOGRAPHY;  Surgeon: Jolaine Artist, MD;  Location: [facility];  Service: Cardiovascular;  Laterality: N/A;    There were no vitals filed for this visit.  Subjective Assessment - 09/21/17 1337    Subjective  Pt states he took a cardio class at the [facility] last week - was able to do about 30" of it    Pertinent History  Rt pontine CVA on 07-02-17: alcohol use; cocaine abuse:  anxiety/depression:  myocardiopathy;  acute systolic heart failure; s/p Rt/Lt heart cath & coronary angiography (07-05-17)    Patient Stated Goals  improve balance and gait; increase endurance    Currently in Pain?  No/denies                      [facility] Adult PT Treatment/Exercise - 09/21/17 0001      Lumbar Exercises: Aerobic   Elliptical  Level 1.6 x 2" forward    Recumbent Bike  SciFit level 3.0 x 5"       Lumbar  Exercises: Standing   Heel Raises  10 reps LLE only 10 reps    Functional Squats  10 reps      Lumbar Exercises: Quadruped   [place]  5 reps    Opposite Arm/Leg Raise  Right arm/Left leg;Left arm/Right leg;Other (comment);5 seconds 3 reps each side    Other Quadruped Lumbar Exercises  Plank hold 10 secs       Knee/Hip Exercises: Plyometrics   Bilateral Jumping  1 set;5 sets    Other Plyometric Exercises  Jump rope - pt attempted several times - able to obtain 2 consecutive jumps       TherAct:   1/2 jumping jacks x 5 reps; lunges x 5 reps - alternating legs (no UE support used) Jumping forward and then backward over theraband strip on floor                PT Long Term Goals - 09/06/17 1018      PT LONG TERM GOAL #1   Title  Increase gait velocity from 3.78 ft/sec to >/= 4.1 ft/sec for incr. gait efficiency.    Baseline  7.44= 4.41 ft/sec on 09-04-17    Status  Achieved      PT  LONG TERM GOAL #2   Title  Pt will amb. for 10" nonstop with minimal c/o fatigue, i.e. </= 2/10 intensity of fatigue.    Baseline  met 09-04-17    Status  Achieved      PT LONG TERM GOAL #3   Title  Pt will increase FGA score by at least 5 points to demo improved balance with gait.    Baseline  score 30/30 on 09-06-17    Status  Achieved      PT LONG TERM GOAL #4   Title  Independent in HEP for LLE strengthening and balance.      PT LONG TERM GOAL #5   Title  Negotiate 4 steps without rail using a step over step sequence with supervision to demo improved balance.      PT LONG TERM GOAL #6   Title  Pt will demonstrate increased endurance by performing 10" cardiovascular exercise (including treadmill, elliptical and bike) with perceived exertion scale score of </= 5/10.    Baseline  Pt currently able to amb. 10" at  varying speeds on unlevel paved surface    Time  4    Period  Weeks    Status  New    Target Date  10/04/17      PT LONG TERM GOAL #7   Title  Assess DGI and set goal  as appropriate.    Time  4    Period  Weeks    Status  New    Target Date  10/04/17      PT LONG TERM GOAL #8   Title  Pt will report at least 50% improvement in gait pattern by less deviations exhibited.    Time  4    Period  Weeks    Status  New    Target Date  10/04/17            Plan - 09/21/17 1558    Clinical Impression Statement  Pt is progressing well towards goals and demonstrates increased endurance and activity tolerance; pt required only 2 short rest breaks during session    Rehab Potential  Good    PT Frequency  2x / week    PT Duration  4 weeks    PT Treatment/Interventions  ADLs/Self Care Home Management;Gait training;Stair training;Therapeutic activities;Therapeutic exercise;Patient/family education;Neuromuscular re-education;Balance training    PT Next Visit Plan  cont endurance and strengthening exs    PT Home Exercise Plan  LLE strengthening - hip ext., hamstrings, PF and DF LLE    Consulted and Agree with Plan of Care  Patient       Patient will benefit from skilled therapeutic intervention in order to improve the following deficits and impairments:  Abnormal gait, Cardiopulmonary status limiting activity, Decreased activity tolerance, Decreased balance, Decreased cognition, Decreased endurance, Decreased coordination, Decreased strength, Impaired sensation, Impaired UE functional use, Other (comment)  Visit Diagnosis: Other lack of coordination  Muscle weakness (generalized)  Unsteadiness on feet     Problem List Patient Active Problem List   Diagnosis Date Noted  . Chronic systolic heart failure ([place]) 08/07/2017  . Encounter for therapeutic drug monitoring 07/27/2017  . Muscle pain   . Supratherapeutic INR   . Leukocytosis   . Coronary artery disease involving native coronary artery of native heart without angina pectoris   . Prediabetes   . Hemorrhoids   . Right pontine stroke ([place]) 07/06/2017  . Left hemiparesis ([place])   .  Cardiomyopathy, ischemic   . 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 Myocardiopathy ([place])   . Mixed hyperlipidemia   . CVA (cerebral vascular accident) ([place]) 07/02/2017  . Alcohol use 07/02/2017  . Anxiety 07/02/2017  . Depression 07/02/2017  . Acute hyperglycemia 07/02/2017  . Elevated blood-pressure reading without diagnosis of hypertension 07/02/2017  . ADHD 07/02/2017  . Cocaine abuse ([place]) 07/02/2017  . Marijuana abuse 06/20/2017  . Cocaine use 06/20/2017    DildayJenness Corner, PT 09/21/2017, 4:05 PM  [facility] [address] [place], [place], [address] Phone: [phone]   Fax:  [phone]  Name: Angel Davies MRN: [id number] Date of Birth: [birth year]

## 2017-09-25 ENCOUNTER — Encounter: Payer: Managed Care, Other (non HMO) | Admitting: Occupational Therapy

## 2017-09-25 ENCOUNTER — Ambulatory Visit: Payer: Managed Care, Other (non HMO) | Admitting: Physical Therapy

## 2017-09-25 DIAGNOSIS — R278 Other lack of coordination: Secondary | ICD-10-CM

## 2017-09-25 DIAGNOSIS — M6281 Muscle weakness (generalized): Secondary | ICD-10-CM

## 2017-09-25 DIAGNOSIS — R2689 Other abnormalities of gait and mobility: Secondary | ICD-10-CM | POA: Diagnosis not present

## 2017-09-26 ENCOUNTER — Encounter: Payer: Self-pay | Admitting: Physical Therapy

## 2017-09-26 NOTE — Therapy (Signed)
[facility] [address] [place], [place], [address] Phone: [phone]   Fax:  [phone]  Physical Therapy Treatment  Patient Details  Name: Angel Davies MRN: [id number] Date of Birth: Jul 21, 1971 Referring Provider: Dr. Alysia Penna   Encounter Date: 09/25/2017  PT End of Session - 09/26/17 1326    Visit Number  11    Number of Visits  15    Date for PT Re-Evaluation  10/04/17    Authorization Type  [employer] - await info on visit limit    PT Start Time  1150    PT Stop Time  1235    PT Time Calculation (min)  45 min    Equipment Utilized During Treatment  Gait belt       Past Medical History:  Diagnosis Date  . ADHD (attention deficit hyperactivity disorder)   . Anxiety   . CVA (cerebral vascular accident) ([place]) 07/02/2017   "left sided weakness" (07/02/2017)  . Depression   . History of kidney stones     Past Surgical History:  Procedure Laterality Date  . CYSTOSCOPY WITH URETEROSCOPY, STONE BASKETRY AND STENT PLACEMENT    . RIGHT/LEFT HEART CATH AND CORONARY ANGIOGRAPHY N/A 07/05/2017   Procedure: RIGHT/LEFT HEART CATH AND CORONARY ANGIOGRAPHY;  Surgeon: Jolaine Artist, MD;  Location: [facility];  Service: Cardiovascular;  Laterality: N/A;    There were no vitals filed for this visit.  Subjective Assessment - 09/26/17 1322    Subjective  Pt states he continues to do exercises at home - went to [facility] again and did 30" of a cardio class    Pertinent History  Rt pontine CVA on 07-02-17: alcohol use; cocaine abuse:  anxiety/depression:  myocardiopathy;  acute systolic heart failure; s/p Rt/Lt heart cath & coronary angiography (07-05-17)    Patient Stated Goals  improve balance and gait; increase endurance    Currently in Pain?  No/denies                      [facility] Adult PT Treatment/Exercise - 09/26/17 0001      High Level Balance   High Level Balance Activities  Side  stepping;Braiding;Marching forwards      Therapeutic Activites    Therapeutic Activities  Other Therapeutic Activities Pt performed jumping. lunges, 1/2 jumping jacks on mini tra      Lumbar Exercises: Aerobic   Elliptical  Level 1.6 x 1" forward    Recumbent Bike  SciFit level 3.0 x 5"       Lumbar Exercises: Standing   Heel Raises  10 reps LLE only 10 reps    Functional Squats  -- on BOSU      Knee/Hip Exercises: Stretches   Active Hamstring Stretch  Left;1 rep;30 seconds    Gastroc Stretch  Left;1 rep;30 seconds      Knee/Hip Exercises: Plyometrics   Bilateral Jumping  1 set;5 reps    Other Plyometric Exercises  Pt performed skipping activity laterally iwth use of [employer] step with no risers used - 5 reps to Rt and Lt sides      Resisted walking 115' x 3 laps - no rest break - with use of sports cord with moderate resistance given  1/2 jumping jacks, alternate lunges performed on mini-trampoline; 5 reps each activity separately then combined for 5 reps for  Total of 15 reps  Pt performed standing on Bosu - no UE support - squats 10 reps x  2 sets  Jogging 50' x 2 reps with Supervision - no LOB occurred            PT Long Term Goals - 09/06/17 1018      PT LONG TERM GOAL #1   Title  Increase gait velocity from 3.78 ft/sec to >/= 4.1 ft/sec for incr. gait efficiency.    Baseline  7.44= 4.41 ft/sec on 09-04-17    Status  Achieved      PT LONG TERM GOAL #2   Title  Pt will amb. for 10" nonstop with minimal c/o fatigue, i.e. </= 2/10 intensity of fatigue.    Baseline  met 09-04-17    Status  Achieved      PT LONG TERM GOAL #3   Title  Pt will increase FGA score by at least 5 points to demo improved balance with gait.    Baseline  score 30/30 on 09-06-17    Status  Achieved      PT LONG TERM GOAL #4   Title  Independent in HEP for LLE strengthening and balance.      PT LONG TERM GOAL #5   Title  Negotiate 4 steps without rail using a step over step sequence with  supervision to demo improved balance.      PT LONG TERM GOAL #6   Title  Pt will demonstrate increased endurance by performing 10" cardiovascular exercise (including treadmill, elliptical and bike) with perceived exertion scale score of </= 5/10.    Baseline  Pt currently able to amb. 10" at  varying speeds on unlevel paved surface    Time  4    Period  Weeks    Status  New    Target Date  10/04/17      PT LONG TERM GOAL #7   Title  Assess DGI and set goal as appropriate.    Time  4    Period  Weeks    Status  New    Target Date  10/04/17      PT LONG TERM GOAL #8   Title  Pt will report at least 50% improvement in gait pattern by less deviations exhibited.    Time  4    Period  Weeks    Status  New    Target Date  10/04/17            Plan - 09/26/17 1326    Clinical Impression Statement  Pt tolerated cardiovascular exercises well with very few rest breaks needed, and of short duration when seated rest break was taken.  Pt had no LOB with any balance exercises; some ataxia in LLE was noted with lateral jumping/ skipping over [employer]ebok step just prior to Lt foot landing on flloor.  Pt is progressing well towards goals.     Rehab Potential  Good    PT Frequency  2x / week    PT Duration  4 weeks    PT Treatment/Interventions  ADLs/Self Care Home Management;Gait training;Stair training;Therapeutic activities;Therapeutic exercise;Patient/family education;Neuromuscular re-education;Balance training    PT Next Visit Plan  cont endurance and strengthening exs    PT Home Exercise Plan  LLE strengthening - hip ext., hamstrings, PF and DF LLE    Consulted and Agree with Plan of Care  Patient       Patient will benefit from skilled therapeutic intervention in order to improve the following deficits and impairments:  Abnormal gait, Cardiopulmonary status limiting activity, Decreased activity tolerance, Decreased balance, Decreased cognition, Decreased  endurance, Decreased coordination,  Decreased strength, Impaired sensation, Impaired UE functional use, Other (comment)  Visit Diagnosis: Muscle weakness (generalized)  Other lack of coordination  Other abnormalities of gait and mobility     Problem List Patient Active Problem List   Diagnosis Date Noted  . Chronic systolic heart failure ([place]) 08/07/2017  . Encounter for therapeutic drug monitoring 07/27/2017  . Muscle pain   . Supratherapeutic INR   . Leukocytosis   . Coronary artery disease involving native coronary artery of native heart without angina pectoris   . Prediabetes   . Hemorrhoids   . Right pontine stroke ([place]) 07/06/2017  . Left hemiparesis ([place])   . Cardiomyopathy, ischemic   . LV (left ventricular) mural thrombus following MI ([place])   . Myocardiopathy ([place])   . Mixed hyperlipidemia   . CVA (cerebral vascular accident) ([place]) 07/02/2017  . Alcohol use 07/02/2017  . Anxiety 07/02/2017  . Depression 07/02/2017  . Acute hyperglycemia 07/02/2017  . Elevated blood-pressure reading without diagnosis of hypertension 07/02/2017  . ADHD 07/02/2017  . Cocaine abuse ([place]) 07/02/2017  . Marijuana abuse 06/20/2017  . Cocaine use 06/20/2017    DildayJenness Corner, PT 09/26/2017, 1:31 PM  [facility] [address] [place], [place], [address] Phone: [phone]   Fax:  [phone]  Name: Angel Davies MRN: [id number] Date of Birth: 12-22-1970

## 2017-09-27 ENCOUNTER — Ambulatory Visit (INDEPENDENT_AMBULATORY_CARE_PROVIDER_SITE_OTHER): Payer: Managed Care, Other (non HMO) | Admitting: *Deleted

## 2017-09-27 ENCOUNTER — Encounter: Payer: Managed Care, Other (non HMO) | Admitting: Occupational Therapy

## 2017-09-27 ENCOUNTER — Ambulatory Visit: Payer: Managed Care, Other (non HMO) | Admitting: Physical Therapy

## 2017-09-27 ENCOUNTER — Encounter: Payer: Self-pay | Admitting: Physical Therapy

## 2017-09-27 DIAGNOSIS — R2689 Other abnormalities of gait and mobility: Secondary | ICD-10-CM

## 2017-09-27 DIAGNOSIS — I255 Ischemic cardiomyopathy: Secondary | ICD-10-CM

## 2017-09-27 DIAGNOSIS — Z5181 Encounter for therapeutic drug level monitoring: Secondary | ICD-10-CM

## 2017-09-27 DIAGNOSIS — I6350 Cerebral infarction due to unspecified occlusion or stenosis of unspecified cerebral artery: Secondary | ICD-10-CM

## 2017-09-27 DIAGNOSIS — I236 Thrombosis of atrium, auricular appendage, and ventricle as current complications following acute myocardial infarction: Secondary | ICD-10-CM | POA: Diagnosis not present

## 2017-09-27 DIAGNOSIS — R278 Other lack of coordination: Secondary | ICD-10-CM

## 2017-09-27 LAB — POCT INR: INR: 3

## 2017-09-27 NOTE — Therapy (Signed)
[facility] [address] [place], [place], [address] Phone: [phone]   Fax:  [phone]  Physical Therapy Treatment  Patient Details  Name: Angel Davies MRN: [id number] Date of Birth: 05-04-1971 Referring Provider: Dr. Alysia Penna   Encounter Date: 09/27/2017  PT End of Session - 09/27/17 2040    Visit Number  12    Number of Visits  15    Date for PT Re-Evaluation  10/04/17    Authorization Type  [employer] - await info on visit limit    PT Start Time  1103    PT Stop Time  1151    PT Time Calculation (min)  48 min    Equipment Utilized During Treatment  Gait belt       Past Medical History:  Diagnosis Date  . ADHD (attention deficit hyperactivity disorder)   . Anxiety   . CVA (cerebral vascular accident) ([place]) 07/02/2017   "left sided weakness" (07/02/2017)  . Depression   . History of kidney stones     Past Surgical History:  Procedure Laterality Date  . CYSTOSCOPY WITH URETEROSCOPY, STONE BASKETRY AND STENT PLACEMENT    . RIGHT/LEFT HEART CATH AND CORONARY ANGIOGRAPHY N/A 07/05/2017   Procedure: RIGHT/LEFT HEART CATH AND CORONARY ANGIOGRAPHY;  Surgeon: Jolaine Artist, MD;  Location: [facility];  Service: Cardiovascular;  Laterality: N/A;    There were no vitals filed for this visit.  Subjective Assessment - 09/27/17 2032    Subjective  Pt states he did 15" of a HIIT class at [facility] - was "much harder than the other cardio class"    Pertinent History  Rt pontine CVA on 07-02-17: alcohol use; cocaine abuse:  anxiety/depression:  myocardiopathy;  acute systolic heart failure; s/p Rt/Lt heart cath & coronary angiography (07-05-17)    Patient Stated Goals  improve balance and gait; increase endurance    Currently in Pain?  No/denies                      [facility] Adult PT Treatment/Exercise - 09/27/17 1117      Ambulation/Gait   Stairs  Yes    Stairs Assistance  5: Supervision     Stair Management Technique  No rails;Forwards;Alternating pattern cues to place toes on edge of step to decr. ataxia in LLE    Number of Stairs  12    Height of Stairs  6      Therapeutic Activites    Therapeutic Activities  Other Therapeutic Activities Pt performed jumping. lunges, 1/2 jumping jacks on mini tra      Neuro Re-ed    Neuro Re-ed Details   Pt performed cone taps and tipping cone over and stnding upright with RLE and LLE to improve SLS on each leg to improve coordination of LLE       Lumbar Exercises: Aerobic   Elliptical  level 1.5 x 1" forward and 1" backward      Knee/Hip Exercises: Standing   Lateral Step Up  Left;1 set;10 reps;Hand Hold: 0;Step Height: 6";Hand Hold: 1    Forward Step Up  Both;1 set;10 reps;Hand Hold: 0;Step Height: 6"    Other Standing Knee Exercises  Pt amb. 17' with blue theraband around ankles for abduction/ adduction with small squats; 40' forward and then 1' with legs abducted with blue theraband                   PT Long Term Goals -  09/06/17 1018      PT LONG TERM GOAL #1   Title  Increase gait velocity from 3.78 ft/sec to >/= 4.1 ft/sec for incr. gait efficiency.    Baseline  7.44= 4.41 ft/sec on 09-04-17    Status  Achieved      PT LONG TERM GOAL #2   Title  Pt will amb. for 10" nonstop with minimal c/o fatigue, i.e. </= 2/10 intensity of fatigue.    Baseline  met 09-04-17    Status  Achieved      PT LONG TERM GOAL #3   Title  Pt will increase FGA score by at least 5 points to demo improved balance with gait.    Baseline  score 30/30 on 09-06-17    Status  Achieved      PT LONG TERM GOAL #4   Title  Independent in HEP for LLE strengthening and balance.      PT LONG TERM GOAL #5   Title  Negotiate 4 steps without rail using a step over step sequence with supervision to demo improved balance.      PT LONG TERM GOAL #6   Title  Pt will demonstrate increased endurance by performing 10" cardiovascular exercise (including  treadmill, elliptical and bike) with perceived exertion scale score of </= 5/10.    Baseline  Pt currently able to amb. 10" at  varying speeds on unlevel paved surface    Time  4    Period  Weeks    Status  New    Target Date  10/04/17      PT LONG TERM GOAL #7   Title  Assess DGI and set goal as appropriate.    Time  4    Period  Weeks    Status  New    Target Date  10/04/17      PT LONG TERM GOAL #8   Title  Pt will report at least 50% improvement in gait pattern by less deviations exhibited.    Time  4    Period  Weeks    Status  New    Target Date  10/04/17            Plan - 09/27/17 2041    Clinical Impression Statement  Pt continues to progress well with increasing endurance and activity tolerance; pt required 3 short seated rest breaks during PT session today; some ataxia was noted in LLE with step descension but with cue to place Lt toes on edge of step                                                                       Rehab Potential  Good    PT Frequency  2x / week    PT Duration  4 weeks    PT Treatment/Interventions  ADLs/Self Care Home Management;Gait training;Stair training;Therapeutic activities;Therapeutic exercise;Patient/family education;Neuromuscular re-education;Balance training    PT Next Visit Plan  cont endurance and strengthening exs    PT Home Exercise Plan  LLE strengthening - hip ext., hamstrings, PF and DF LLE    Consulted and Agree with Plan of Care  Patient    Family Member Consulted  brother Eddie Dibbles       Patient will  benefit from skilled therapeutic intervention in order to improve the following deficits and impairments:  Abnormal gait, Cardiopulmonary status limiting activity, Decreased activity tolerance, Decreased balance, Decreased cognition, Decreased endurance, Decreased coordination, Decreased strength, Impaired sensation, Impaired UE functional use, Other (comment)  Visit Diagnosis: Other lack of coordination  Other abnormalities  of gait and mobility     Problem List Patient Active Problem List   Diagnosis Date Noted  . Chronic systolic heart failure ([place]) 08/07/2017  . Encounter for therapeutic drug monitoring 07/27/2017  . Muscle pain   . Supratherapeutic INR   . Leukocytosis   . Coronary artery disease involving native coronary artery of native heart without angina pectoris   . Prediabetes   . Hemorrhoids   . Right pontine stroke ([place]) 07/06/2017  . Left hemiparesis ([place])   . Cardiomyopathy, ischemic   . LV (left ventricular) mural thrombus following MI ([place])   . Myocardiopathy ([place])   . Mixed hyperlipidemia   . CVA (cerebral vascular accident) ([place]) 07/02/2017  . Alcohol use 07/02/2017  . Anxiety 07/02/2017  . Depression 07/02/2017  . Acute hyperglycemia 07/02/2017  . Elevated blood-pressure reading without diagnosis of hypertension 07/02/2017  . ADHD 07/02/2017  . Cocaine abuse ([place]) 07/02/2017  . Marijuana abuse 06/20/2017  . Cocaine use 06/20/2017    DildayJenness Corner, PT 09/27/2017, 8:59 PM  [facility] [address] [place], [place], [address] Phone: [phone]   Fax:  [phone]  Name: Angel Davies MRN: [id number] Date of Birth: 1971/03/24

## 2017-09-27 NOTE — Patient Instructions (Signed)
Description   Continue same dose of coumadin 2mg  (1 tablet) daily except Coumadin 3mg  (1.5 tablets) on Saturdays. Recheck INR in 3 weeks.

## 2017-10-02 ENCOUNTER — Ambulatory Visit: Payer: Managed Care, Other (non HMO) | Attending: Physical Medicine & Rehabilitation | Admitting: Physical Therapy

## 2017-10-02 ENCOUNTER — Encounter: Payer: Managed Care, Other (non HMO) | Admitting: Occupational Therapy

## 2017-10-02 DIAGNOSIS — R278 Other lack of coordination: Secondary | ICD-10-CM | POA: Insufficient documentation

## 2017-10-02 DIAGNOSIS — R2689 Other abnormalities of gait and mobility: Secondary | ICD-10-CM | POA: Diagnosis present

## 2017-10-03 ENCOUNTER — Encounter: Payer: Self-pay | Admitting: Physical Therapy

## 2017-10-03 NOTE — Therapy (Signed)
[facility] [address] [place], [place], [address] Phone: [phone]   Fax:  [phone]  Physical Therapy Treatment  Patient Details  Name: Angel Davies MRN: [id number] Date of Birth: 1971-07-31 Referring Provider: Dr. Alysia Penna   Encounter Date: 10/02/2017  PT End of Session - 10/03/17 2051    Visit Number  13    Number of Visits  15    Date for PT Re-Evaluation  10/04/17    Authorization Type  [employer] - await info on visit limit    PT Start Time  1150    PT Stop Time  1236    PT Time Calculation (min)  46 min       Past Medical History:  Diagnosis Date  . ADHD (attention deficit hyperactivity disorder)   . Anxiety   . CVA (cerebral vascular accident) ([place]) 07/02/2017   "left sided weakness" (07/02/2017)  . Depression   . History of kidney stones     Past Surgical History:  Procedure Laterality Date  . CYSTOSCOPY WITH URETEROSCOPY, STONE BASKETRY AND STENT PLACEMENT    . RIGHT/LEFT HEART CATH AND CORONARY ANGIOGRAPHY N/A 07/05/2017   Procedure: RIGHT/LEFT HEART CATH AND CORONARY ANGIOGRAPHY;  Surgeon: Jolaine Artist, MD;  Location: [facility];  Service: Cardiovascular;  Laterality: N/A;    There were no vitals filed for this visit.  Subjective Assessment - 10/03/17 2049    Subjective  No problems or changes reported    Pertinent History  Rt pontine CVA on 07-02-17: alcohol use; cocaine abuse:  anxiety/depression:  myocardiopathy;  acute systolic heart failure; s/p Rt/Lt heart cath & coronary angiography (07-05-17)    Patient Stated Goals  improve balance and gait; increase endurance    Currently in Pain?  No/denies                      [facility] Adult PT Treatment/Exercise - 10/03/17 0001      Lumbar Exercises: Aerobic   Elliptical  level 1.5 x 1" forward and 1" backward    Recumbent Bike  SciFit level 3.0 x 5"       Lumbar Exercises: Standing   Heel Raises  10 reps     Functional Squats  10 reps on Bosu      Knee/Hip Exercises: Stretches   Active Hamstring Stretch  Right;Left;1 rep;30 seconds each leg - runner's stretch      Knee/Hip Exercises: Standing   Forward Step Up  Both;1 set;10 reps;Step Height: 6";Hand Hold: 0      Ankle Exercises: Stretches   Gastroc Stretch  1 rep;30 seconds each leg           [facility] Adult PT Treatment/Exercise     High Level Balance   High Level Balance Activities  Side stepping;Braiding;Marching forwards      Therapeutic Activites    Therapeutic Activities  Other Therapeutic Activities Pt performed jumping. lunges, 1/2 jumping jacks on mini tra            PT Long Term Goals - 09/06/17 1018      PT LONG TERM GOAL #1   Title  Increase gait velocity from 3.78 ft/sec to >/= 4.1 ft/sec for incr. gait efficiency.    Baseline  7.44= 4.41 ft/sec on 09-04-17    Status  Achieved      PT LONG TERM GOAL #2   Title  Pt will amb. for 10" nonstop with minimal c/o fatigue, i.e. </=  2/10 intensity of fatigue.    Baseline  met 09-04-17    Status  Achieved      PT LONG TERM GOAL #3   Title  Pt will increase FGA score by at least 5 points to demo improved balance with gait.    Baseline  score 30/30 on 09-06-17    Status  Achieved      PT LONG TERM GOAL #4   Title  Independent in HEP for LLE strengthening and balance.      PT LONG TERM GOAL #5   Title  Negotiate 4 steps without rail using a step over step sequence with supervision to demo improved balance.      PT LONG TERM GOAL #6   Title  Pt will demonstrate increased endurance by performing 10" cardiovascular exercise (including treadmill, elliptical and bike) with perceived exertion scale score of </= 5/10.    Baseline  Pt currently able to amb. 10" at  varying speeds on unlevel paved surface    Time  4    Period  Weeks    Status  New    Target Date  10/04/17      PT LONG TERM GOAL #7   Title  Assess DGI and set goal as appropriate.    Time  4    Period   Weeks    Status  New    Target Date  10/04/17      PT LONG TERM GOAL #8   Title  Pt will report at least 50% improvement in gait pattern by less deviations exhibited.    Time  4    Period  Weeks    Status  New    Target Date  10/04/17            Plan - 10/03/17 2052    Clinical Impression Statement  Pt progressing well towards goals; pt wearing hard bottomed shoes - had c/o Rt lateral lower leg pain with jumping and plyometric exercises; recommended pt to wear tennis shoes to next session for more shock absorption and support     Rehab Potential  Good    PT Frequency  2x / week    PT Duration  4 weeks    PT Treatment/Interventions  ADLs/Self Care Home Management;Gait training;Stair training;Therapeutic activities;Therapeutic exercise;Patient/family education;Neuromuscular re-education;Balance training    PT Next Visit Plan  cont endurance and strengthening exs    PT Home Exercise Plan  LLE strengthening - hip ext., hamstrings, PF and DF LLE    Consulted and Agree with Plan of Care  Patient       Patient will benefit from skilled therapeutic intervention in order to improve the following deficits and impairments:  Abnormal gait, Cardiopulmonary status limiting activity, Decreased activity tolerance, Decreased balance, Decreased cognition, Decreased endurance, Decreased coordination, Decreased strength, Impaired sensation, Impaired UE functional use, Other (comment)  Visit Diagnosis: Other abnormalities of gait and mobility  Other lack of coordination     Problem List Patient Active Problem List   Diagnosis Date Noted  . Chronic systolic heart failure ([place]) 08/07/2017  . Encounter for therapeutic drug monitoring 07/27/2017  . Muscle pain   . Supratherapeutic INR   . Leukocytosis   . Coronary artery disease involving native coronary artery of native heart without angina pectoris   . Prediabetes   . Hemorrhoids   . Right pontine stroke ([facility]) 07/06/2017  . Left  hemiparesis ([facility])   . Cardiomyopathy, ischemic   . LV (left ventricular) mural thrombus  following MI ([facility])   . Myocardiopathy ([facility])   . Mixed hyperlipidemia   . CVA (cerebral vascular accident) ([facility]) 07/02/2017  . Alcohol use 07/02/2017  . Anxiety 07/02/2017  . Depression 07/02/2017  . Acute hyperglycemia 07/02/2017  . Elevated blood-pressure reading without diagnosis of hypertension 07/02/2017  . ADHD 07/02/2017  . Cocaine abuse ([facility]) 07/02/2017  . Marijuana abuse 06/20/2017  . Cocaine use 06/20/2017    [place], PT 10/03/2017, 8:57 PM  [facility] [address] [place], [place], [address] Phone: [phone]   Fax:  [phone]  Name: Angel Davies MRN: [id number] Date of Birth: 03-10-1971

## 2017-10-04 ENCOUNTER — Encounter: Payer: Managed Care, Other (non HMO) | Admitting: Occupational Therapy

## 2017-10-11 ENCOUNTER — Ambulatory Visit: Payer: Managed Care, Other (non HMO) | Admitting: Physical Therapy

## 2017-10-11 DIAGNOSIS — R2689 Other abnormalities of gait and mobility: Secondary | ICD-10-CM

## 2017-10-11 DIAGNOSIS — R278 Other lack of coordination: Secondary | ICD-10-CM

## 2017-10-12 ENCOUNTER — Encounter: Payer: Self-pay | Admitting: Physical Therapy

## 2017-10-12 NOTE — Therapy (Signed)
[facility] [address] [place], [place], [address] Phone: [phone]   Fax:  [phone]  Physical Therapy Treatment  Patient Details  Name: Angel Davies MRN: [id number] Date of Birth: November 06, 1970 Referring Provider: Dr. Alysia Penna   Encounter Date: 10/11/2017  PT End of Session - 10/12/17 1543    Visit Number  14    Number of Visits  15    Date for PT Re-Evaluation  10/04/17    Authorization Type  [employer] - await info on visit limit    PT Start Time  1104    PT Stop Time  1151    PT Time Calculation (min)  47 min       Past Medical History:  Diagnosis Date  . ADHD (attention deficit hyperactivity disorder)   . Anxiety   . CVA (cerebral vascular accident) ([facility]) 07/02/2017   "left sided weakness" (07/02/2017)  . Depression   . History of kidney stones     Past Surgical History:  Procedure Laterality Date  . CYSTOSCOPY WITH URETEROSCOPY, STONE BASKETRY AND STENT PLACEMENT    . RIGHT/LEFT HEART CATH AND CORONARY ANGIOGRAPHY N/A 07/05/2017   Procedure: RIGHT/LEFT HEART CATH AND CORONARY ANGIOGRAPHY;  Surgeon: Jolaine Artist, MD;  Location: [facility];  Service: Cardiovascular;  Laterality: N/A;    There were no vitals filed for this visit.  Subjective Assessment - 10/12/17 1534    Subjective  Pt states he is doing well - is going to do Silver Sneakers class at the [facility] as the other cardio class is still challenging    Pertinent History  Rt pontine CVA on 07-02-17: alcohol use; cocaine abuse:  anxiety/depression:  myocardiopathy;  acute systolic heart failure; s/p Rt/Lt heart cath & coronary angiography (07-05-17)    Patient Stated Goals  improve balance and gait; increase endurance    Currently in Pain?  No/denies                      [facility] Adult PT Treatment/Exercise - 10/12/17 0001      Ambulation/Gait   Ambulation/Gait  Yes    Ambulation/Gait Assistance  7: Independent     Ambulation/Gait Assistance Details  no device    Ambulation Distance (Feet)  1200 Feet    Assistive device  None    Gait Pattern  Within Functional Limits    Ambulation Surface  Level;Unlevel;Indoor;Outdoor;Paved    Stairs  Yes    Stairs Assistance  6: Modified independent (Device/Increase time)    Stair Management Technique  No rails;Forwards;Alternating pattern    Number of Stairs  4    Height of Stairs  6      Therapeutic Activites    Therapeutic Activities  Other Therapeutic Activities Pt performed jumping. lunges, 1/2 jumping jacks on mini tra      Lumbar Exercises: Aerobic   Elliptical  level 1.5 x 3"      Lumbar Exercises: Standing   Heel Raises  10 reps LLE only 10 reps with UE support             PT Education - 10/12/17 1542    Education provided  Yes    Education Details  reviewed Lt unilateral heel raise exercise; reviewed LTG's and progress - discussed pt's plan to continue cardiovascular exercise at [facility]) Educated  Patient    Methods  Explanation;Demonstration    Comprehension  Verbalized understanding;Returned demonstration  PT Long Term Goals - 10/12/17 1535      PT LONG TERM GOAL #1   Title  Increase gait velocity from 3.78 ft/sec to >/= 4.1 ft/sec for incr. gait efficiency.    Baseline  7.44= 4.41 ft/sec on 09-04-17    Status  Achieved      PT LONG TERM GOAL #2   Title  Pt will amb. for 10" nonstop with minimal c/o fatigue, i.e. </= 2/10 intensity of fatigue.    Baseline  met 09-04-17    Status  Achieved      PT LONG TERM GOAL #3   Title  Pt will increase FGA score by at least 5 points to demo improved balance with gait.    Baseline  score 30/30 on 09-06-17    Status  Achieved      PT LONG TERM GOAL #4   Title  Independent in HEP for LLE strengthening and balance.    Baseline  met 10-11-17    Status  Achieved      PT LONG TERM GOAL #5   Title  Negotiate 4 steps without rail using a step over step sequence with supervision  to demo improved balance.    Baseline  met 10-11-17    Status  Achieved      PT LONG TERM GOAL #6   Title  Pt will demonstrate increased endurance by performing 10" cardiovascular exercise (including treadmill, elliptical and bike) with perceived exertion scale score of </= 5/10.    Baseline  met 10-11-17;  3" on elliptical, 10" amb outside     Status  Achieved      PT LONG TERM GOAL #7   Title  Assess DGI and set goal as appropriate.    Baseline  FGA score 30/30 on 09-06-17    Status  Deferred      PT LONG TERM GOAL #8   Title  Pt will report at least 50% improvement in gait pattern by less deviations exhibited.    Baseline  met 10-11-17    Status  Achieved            Plan - 10/12/17 1543    Clinical Impression Statement  Pt has met all LTG's with LTG #7 (Dynamic gait index) deferred as pt has scored 30/30 on the Functional Gait Assessment.  Pt's balance and gait are WNL's - pt plans to continue cardiovascular exercise at [facility] to continue to improve endurance.  Pt has met all LTG's - is ready for D/C at this time.    Rehab Potential  Good    PT Frequency  2x / week    PT Duration  4 weeks    PT Treatment/Interventions  ADLs/Self Care Home Management;Gait training;Stair training;Therapeutic activities;Therapeutic exercise;Patient/family education;Neuromuscular re-education;Balance training    PT Next Visit Plan  N/A - D/C    PT Home Exercise Plan  LLE strengthening - hip ext., hamstrings, PF and DF LLE    Consulted and Agree with Plan of Care  Patient       Patient will benefit from skilled therapeutic intervention in order to improve the following deficits and impairments:  Abnormal gait, Cardiopulmonary status limiting activity, Decreased activity tolerance, Decreased balance, Decreased cognition, Decreased endurance, Decreased coordination, Decreased strength, Impaired sensation, Impaired UE functional use, Other (comment)  Visit Diagnosis: Other abnormalities of gait and  mobility  Other lack of coordination     Problem List Patient Active Problem List   Diagnosis Date Noted  . Chronic systolic  heart failure ([place]) 08/07/2017  . Encounter for therapeutic drug monitoring 07/27/2017  . Muscle pain   . Supratherapeutic INR   . Leukocytosis   . Coronary artery disease involving native coronary artery of native heart without angina pectoris   . Prediabetes   . Hemorrhoids   . Right pontine stroke ([place]) 07/06/2017  . Left hemiparesis ([place])   . Cardiomyopathy, ischemic   . LV (left ventricular) mural thrombus following MI ([place])   . Myocardiopathy ([place])   . Mixed hyperlipidemia   . CVA (cerebral vascular accident) ([facility]) 07/02/2017  . Alcohol use 07/02/2017  . Anxiety 07/02/2017  . Depression 07/02/2017  . Acute hyperglycemia 07/02/2017  . Elevated blood-pressure reading without diagnosis of hypertension 07/02/2017  . ADHD 07/02/2017  . Cocaine abuse ([facility]) 07/02/2017  . Marijuana abuse 06/20/2017  . Cocaine use 06/20/2017     PHYSICAL THERAPY DISCHARGE SUMMARY  Visits from Start of Care: 14  Current functional level related to goals / functional outcomes: See above for progress towards goals - all LTG's met   Remaining deficits: Continued decreased strength in Lt plantarflexors (4- -  4/5 compared to 5/5 on RLE)   Education / Equipment: Pt has been instructed in HEP for LLE strengthening and cardiovascular conditioning exercises; pt plans on continuing to go to [facility] for cardio classes for endurance training. Plan: Patient agrees to discharge.  Patient goals were met. Patient is being discharged due to meeting the stated rehab goals.  ?????       Alda Lea, PT 10/12/2017, 3:47 PM  [facility] [address] [place], [place], [address] Phone: [phone]   Fax:  [phone]  Name: NIKOLAI WILCZAK MRN: [id number] Date of Birth: 1970/08/22

## 2017-10-15 ENCOUNTER — Ambulatory Visit (HOSPITAL_BASED_OUTPATIENT_CLINIC_OR_DEPARTMENT_OTHER): Payer: Managed Care, Other (non HMO) | Admitting: Physical Medicine & Rehabilitation

## 2017-10-15 ENCOUNTER — Encounter: Payer: Managed Care, Other (non HMO) | Attending: Physical Medicine & Rehabilitation

## 2017-10-15 ENCOUNTER — Encounter: Payer: Self-pay | Admitting: Physical Medicine & Rehabilitation

## 2017-10-15 VITALS — BP 108/72 | HR 90

## 2017-10-15 DIAGNOSIS — I6350 Cerebral infarction due to unspecified occlusion or stenosis of unspecified cerebral artery: Secondary | ICD-10-CM | POA: Diagnosis not present

## 2017-10-15 DIAGNOSIS — F909 Attention-deficit hyperactivity disorder, unspecified type: Secondary | ICD-10-CM | POA: Insufficient documentation

## 2017-10-15 DIAGNOSIS — I2510 Atherosclerotic heart disease of native coronary artery without angina pectoris: Secondary | ICD-10-CM | POA: Diagnosis not present

## 2017-10-15 DIAGNOSIS — F1910 Other psychoactive substance abuse, uncomplicated: Secondary | ICD-10-CM | POA: Diagnosis not present

## 2017-10-15 DIAGNOSIS — F419 Anxiety disorder, unspecified: Secondary | ICD-10-CM | POA: Diagnosis not present

## 2017-10-15 DIAGNOSIS — I69354 Hemiplegia and hemiparesis following cerebral infarction affecting left non-dominant side: Secondary | ICD-10-CM | POA: Diagnosis not present

## 2017-10-15 DIAGNOSIS — F329 Major depressive disorder, single episode, unspecified: Secondary | ICD-10-CM | POA: Insufficient documentation

## 2017-10-15 DIAGNOSIS — F1020 Alcohol dependence, uncomplicated: Secondary | ICD-10-CM | POA: Insufficient documentation

## 2017-10-15 DIAGNOSIS — G8194 Hemiplegia, unspecified affecting left nondominant side: Secondary | ICD-10-CM

## 2017-10-15 DIAGNOSIS — Z87891 Personal history of nicotine dependence: Secondary | ICD-10-CM | POA: Insufficient documentation

## 2017-10-15 DIAGNOSIS — H547 Unspecified visual loss: Secondary | ICD-10-CM | POA: Diagnosis not present

## 2017-10-15 NOTE — Patient Instructions (Signed)
Practice TANDEM gait (heel to toe)  Use dumbells for strengthening  May walk but nothing

## 2017-10-15 NOTE — Progress Notes (Signed)
Subjective:    Patient ID: Angel Davies, male    DOB: 07/24/1971, 47 y.o.   MRN: [id number] 47 y.o. male with history of ADHD, anxiety, depression/anxiety, fall 12/2016 with loss of peripheral vision and onset of HA, polysubtance abuse who was admitted on 07/02/17 with fall, slurred speech and left sided weakness. Patient reported that he was drinking ETOH and used cocaine the night before--UDS positive for cocaine and THC. MRI brain revealed "Acute RIGHT pontine infarct, Subacute small RIGHT temporal occipital lobe/PCA territory infarct and severe stenosis distal rigth P2 segment.  2D echo showed EF 20-25% with moderately dilated LV, akinesis of anterior and apical myocardium with grade 2 diastolic dysfunction.  HPI  Balance improving with balance board Working on juggling  Pt close to baseline with attn and concentration (long term issues with that )  Memory deficits for "casual events" Finished OT ~2wks ago FInished PT 1 wk ago  Endurance still lacking , activity Driving without issues but needs to use Google maps more  One glass red wine since hospital, denies any substance abuse  No falls he remains independent with all his self-care and mobility.  We discussed his follow-ups with cardiology.  Most recent ejection fraction was about 30% Pain Inventory Average Pain 2 Pain Right Now 1 My pain is dull  In the last 24 hours, has pain interfered with the following? General activity 1 Relation with others 3 Enjoyment of life 3 What TIME of day is your pain at its worst? morning Sleep (in general) Fair  Pain is worse with: walking Pain improves with: rest, heat/ice and medication Relief from Meds: na  Mobility walk without assistance ability to climb steps?  yes do you drive?  yes  Function employed # of hrs/week .  Neuro/Psych weakness numbness tingling spasms confusion  Prior Studies Any changes since last visit?  no  Physicians involved in your care Any  changes since last visit?  no   Family History  Problem Relation Age of Onset  . Stroke Mother   . Colon cancer Mother   . Prostate cancer Father   . Seizures Neg Hx    Social History   Socioeconomic History  . Marital status: Married    Spouse name: Not on file  . Number of children: Not on file  . Years of education: Not on file  . Highest education level: Not on file  Social Needs  . Financial resource strain: Not on file  . Food insecurity - worry: Not on file  . Food insecurity - inability: Not on file  . Transportation needs - medical: Not on file  . Transportation needs - non-medical: Not on file  Occupational History  . Not on file  Tobacco Use  . Smoking status: Former Smoker    Packs/day: 0.00    Years: 0.00    Pack years: 0.00  . Smokeless tobacco: Never Used  Substance and Sexual Activity  . Alcohol use: Yes    Alcohol/week: 2.4 oz    Types: 4 Glasses of wine per week  . Drug use: Yes    Types: Marijuana, "Crack" cocaine    Comment: 07/02/2017 "smokes marijuana a couple days/week" , "08/20/17 vapes marijuana juice"  . Sexual activity: Yes  Other Topics Concern  . Not on file  Social History Narrative  . Not on file   Past Surgical History:  Procedure Laterality Date  . CYSTOSCOPY WITH URETEROSCOPY, STONE BASKETRY AND STENT PLACEMENT    . RIGHT/LEFT HEART  CATH AND CORONARY ANGIOGRAPHY N/A 07/05/2017   Procedure: RIGHT/LEFT HEART CATH AND CORONARY ANGIOGRAPHY;  Surgeon: Dolores Patty, MD;  [facility];  Service: Cardiovascular;  Laterality: N/A;   Past Medical History:  Diagnosis Date  . ADHD (attention deficit hyperactivity disorder)   . Anxiety   . CVA (cerebral vascular accident) ([facility]) 07/02/2017   "left sided weakness" (07/02/2017)  . Depression   . History of kidney stones    There were no vitals taken for this visit.  Opioid Risk Score:   Fall Risk Score:  `1  Depression screen PHQ 2/9  Depression screen PHQ 2/9  08/13/2017  Decreased Interest 0  Down, Depressed, Hopeless 0  PHQ - 2 Score 0     Review of Systems  HENT: Negative.   Eyes: Negative.   Respiratory: Negative.   Cardiovascular: Negative.   Gastrointestinal: Negative.   Endocrine: Negative.   Genitourinary: Negative.   Musculoskeletal: Negative.   Skin: Negative.   Allergic/Immunologic: Negative.   Neurological: Positive for weakness and numbness.  Hematological: Negative.   Psychiatric/Behavioral: Positive for confusion.  All other systems reviewed and are negative.      Objective:   Physical Exam  Constitutional: He is oriented to person, place, and time. He appears well-developed and well-nourished.  HENT:  Head: Normocephalic and atraumatic.  Eyes: Conjunctivae and EOM are normal. Pupils are equal, round, and reactive to light.  Neck: Normal range of motion.  Neurological: He is alert and oriented to person, place, and time. No sensory deficit. He exhibits normal muscle tone. Coordination and gait abnormal.  Motor strength is 5/5 in the right deltoid, bicep, tricep, grip, hip flexor, knee extensor, flexor 4/5 in the left deltoid, bicep, tricep, grip 4+ in the left hip flexor knee extensor ankle dorsiflexor  There is evidence of dysmetria left finger-nose-finger testing intact left heel to shin normal on the right side  Standard gait is normal.  Patient is able to Toe Walk and Heel Walk but he has difficulty with tandem gait.  Finger to thumb opposition is normal   Skin: Skin is warm and dry.  Psychiatric: He has a normal mood and affect. His behavior is normal. Judgment and thought content normal.  Nursing note and vitals reviewed.         Assessment & Plan:  1.  Right pontine infarct with residual left hemiparesis and mild left upper extremity ataxia.  He also has evidence of truncal ataxia.  Overall he has had a very good functional recovery.  He is back to modified independent level.  He still having some  mild cognitive deficits but has been able to drive without difficulties. Recommend he continue his home exercise program Recommend that he discuss with cardiology whether he should be going to cardiac rehab.  Physical medicine rehabilitation follow-up in 3 months

## 2017-10-18 ENCOUNTER — Ambulatory Visit (INDEPENDENT_AMBULATORY_CARE_PROVIDER_SITE_OTHER): Payer: Managed Care, Other (non HMO) | Admitting: *Deleted

## 2017-10-18 DIAGNOSIS — I6350 Cerebral infarction due to unspecified occlusion or stenosis of unspecified cerebral artery: Secondary | ICD-10-CM

## 2017-10-18 DIAGNOSIS — I255 Ischemic cardiomyopathy: Secondary | ICD-10-CM | POA: Diagnosis not present

## 2017-10-18 DIAGNOSIS — Z5181 Encounter for therapeutic drug level monitoring: Secondary | ICD-10-CM

## 2017-10-18 DIAGNOSIS — I236 Thrombosis of atrium, auricular appendage, and ventricle as current complications following acute myocardial infarction: Secondary | ICD-10-CM

## 2017-10-18 LAB — POCT INR: INR: 4

## 2017-10-18 NOTE — Patient Instructions (Signed)
Description   Skip today's dose, then Continue same dose of coumadin 2mg  (1 tablet) daily except Coumadin 3mg  (1.5 tablets) on Saturdays. Increase your leafy green intake to 2 servings each week. Recheck INR in 2 weeks. [facility] [phone] Main [phone]

## 2017-10-30 ENCOUNTER — Telehealth: Payer: Self-pay | Admitting: Nurse Practitioner

## 2017-10-30 NOTE — Telephone Encounter (Signed)
[place] case worker with [employer] called stating the pt will no longer be needing/wanting any services. To contact her at [phone] ext [phone] to discuss in detail or for any questions.

## 2017-11-01 ENCOUNTER — Ambulatory Visit (INDEPENDENT_AMBULATORY_CARE_PROVIDER_SITE_OTHER): Payer: Managed Care, Other (non HMO) | Admitting: *Deleted

## 2017-11-01 DIAGNOSIS — I6350 Cerebral infarction due to unspecified occlusion or stenosis of unspecified cerebral artery: Secondary | ICD-10-CM

## 2017-11-01 DIAGNOSIS — I255 Ischemic cardiomyopathy: Secondary | ICD-10-CM

## 2017-11-01 DIAGNOSIS — Z5181 Encounter for therapeutic drug level monitoring: Secondary | ICD-10-CM

## 2017-11-01 DIAGNOSIS — I236 Thrombosis of atrium, auricular appendage, and ventricle as current complications following acute myocardial infarction: Secondary | ICD-10-CM

## 2017-11-01 LAB — POCT INR: INR: 3.4

## 2017-11-01 NOTE — Patient Instructions (Addendum)
Description   Skip today's dose, then change dose to 1 tablet daily except 1/2 tablet on Mondays. Continue eating leafy green intake of 2 servings each week. Recheck INR in 2 weeks. [facility] [phone] Main [phone]

## 2017-11-01 NOTE — Telephone Encounter (Signed)
Spoke to pt and relayed that received call from [employer], that pt did not need services from us.  I called again and still message, did not leave another message.  I called pt and he was not aware of this.  He still planned on coming to appt 12-17-17.   Will leave appt for now.  Pt verbalized understanding.

## 2017-11-01 NOTE — Telephone Encounter (Signed)
I LMVM for Angel Davies at [employer] that returned call.   Please call back.

## 2017-11-13 NOTE — Progress Notes (Signed)
Personally  participated in, made any corrections needed, and agree with history, physical, neuro exam,assessment and plan as stated above.    Antonia Ahern, MD [facility] 

## 2017-11-15 ENCOUNTER — Ambulatory Visit (INDEPENDENT_AMBULATORY_CARE_PROVIDER_SITE_OTHER): Payer: Managed Care, Other (non HMO) | Admitting: *Deleted

## 2017-11-15 DIAGNOSIS — I255 Ischemic cardiomyopathy: Secondary | ICD-10-CM | POA: Diagnosis not present

## 2017-11-15 DIAGNOSIS — I236 Thrombosis of atrium, auricular appendage, and ventricle as current complications following acute myocardial infarction: Secondary | ICD-10-CM

## 2017-11-15 DIAGNOSIS — Z5181 Encounter for therapeutic drug level monitoring: Secondary | ICD-10-CM | POA: Diagnosis not present

## 2017-11-15 DIAGNOSIS — I6350 Cerebral infarction due to unspecified occlusion or stenosis of unspecified cerebral artery: Secondary | ICD-10-CM | POA: Diagnosis not present

## 2017-11-15 LAB — POCT INR: INR: 2

## 2017-11-15 NOTE — Patient Instructions (Signed)
Description   Continue taking 1 tablet daily except 1/2 tablet on Mondays. Continue eating leafy green intake of 2 servings each week. Recheck INR in 3 weeks. [facility] [phone] Main [phone]

## 2017-11-20 ENCOUNTER — Other Ambulatory Visit: Payer: Self-pay | Admitting: Physical Medicine and Rehabilitation

## 2017-12-06 ENCOUNTER — Ambulatory Visit (INDEPENDENT_AMBULATORY_CARE_PROVIDER_SITE_OTHER): Payer: Managed Care, Other (non HMO) | Admitting: Pharmacist

## 2017-12-06 DIAGNOSIS — I6350 Cerebral infarction due to unspecified occlusion or stenosis of unspecified cerebral artery: Secondary | ICD-10-CM

## 2017-12-06 DIAGNOSIS — Z5181 Encounter for therapeutic drug level monitoring: Secondary | ICD-10-CM | POA: Diagnosis not present

## 2017-12-06 DIAGNOSIS — I255 Ischemic cardiomyopathy: Secondary | ICD-10-CM

## 2017-12-06 DIAGNOSIS — I236 Thrombosis of atrium, auricular appendage, and ventricle as current complications following acute myocardial infarction: Secondary | ICD-10-CM

## 2017-12-06 LAB — POCT INR: INR: 1.8

## 2017-12-06 NOTE — Patient Instructions (Signed)
Description   Today take 1.5 tablets then Continue taking 1 tablet daily except 1/2 tablet on Mondays. Continue eating leafy green intake of 2 servings each week. Recheck INR in 3 weeks. [facility] [phone] Main [phone]

## 2017-12-16 NOTE — Progress Notes (Signed)
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 Patient presented via private vehicle with strokelike symptoms. Reportedly went to bed, woke up, was unable to bear weight on the left leg and at that time noticed not only left leg weakness but left-sided numbness, slurred speech and a left facial droop. Patient didn't seek any medical attention till later in the afternoon on 07/02/17. Patient admits to drinking half a box of wine and using cocaine the night before presentation. Pt also uses marijuana 4 times a week Of note, pt had been seen by neurology Dr. Lucia Gaskins on 11/19 with a chief complaint of history of strokes in the family, hx of fall, last in June, which was preceded by vision loss in the left eye peripherally and was associated with headaches. An outpatient MRI of the brain with and without contrast had been planned for 07/06/17. In the ED, CT of the head revealed small bilateral occipital infarcts. Neurology was consulted. A code stroke was called but pt was not a candidate for thrombolytic therapy UPDATE 1/21/2019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  CTA head and neck confirmed high-grade right P2 stenosis.  TTE EF 20-25%.  Unable to calculate LDL,  Triglycerides 539.  Hemoglobin A1c 6.  Urine drug screen positive for cocaine and THC.  He is currently receiving occupational therapy and therapy.  He has a follow-up appointment with Dr. neuro-ophthalmologist before he can start  back driving.  He is currently on Coumadin for secondary stroke prevention without bruising and bleeding.  He says he has stopped smoking and was congratulated for that.  He also says he is no longer using cocaine.  He is on Lipitor and fenofibrate.  He denies any myalgias.  He does complain with memory loss and weakness on the left side.  He returns for reevaluation UPDATE 5/20/2019[name], 47 year old male returns for follow-up with history of acute right pontine which occurred in December 2018.  He is currently on Coumadin for  secondary stroke prevention with minimal bruising and no bleeding.  He remains on Lipitor and fenofibrate without complaints of myalgias.  All therapies have stopped and he is continuing to exercise daily by walking or recumbent bike .He has stopped smoking and denies cocaine use.  He continues to follow with cardiology.  Digoxin was stopped.  He is on Entresto for chronic systolic heart failure.  He returns for reevaluation   REVIEW OF SYSTEMS: Full 14 system review of systems performed and notable only for those listed, all others are neg:  Constitutional: neg  Cardiovascular: neg Ear/Nose/Throat: neg  Skin: neg Eyes: neg Respiratory: neg Gastroitestinal: Urinary frequency Hematology/Lymphatic: neg  Endocrine: neg Musculoskeletal: Neck stiffness Allergy/Immunology: neg Neurological: Memory loss,  Psychiatric: Decreased concentration Sleep : neg   ALLERGIES: No Known Allergies  HOME MEDICATIONS: Outpatient Medications Prior to Visit  Medication Sig Dispense Refill  . acetaminophen (TYLENOL) 500 MG tablet Take 1 tablet (500 mg total) by mouth every 6 (six) hours as needed for headache (pain). 30 tablet 0  . amphetamine-dextroamphetamine (ADDERALL) 20  MG tablet Take 20 mg by mouth 3 (three) times daily.    Marland Kitchen atorvastatin (LIPITOR) 80 MG tablet Take 1 tablet (80 mg total) by mouth daily at 6 PM. 30 tablet 5  . carvedilol (COREG) 6.25 MG tablet Take 1  tablet (6.25 mg total) by mouth 2 (two) times daily with a meal. 60 tablet 5  . fenofibrate 160 MG tablet Take 1 tablet (160 mg total) by mouth daily. 30 tablet 5  . folic acid (FOLVITE) 1 MG tablet Take 1 tablet (1 mg total) by mouth daily. 30 tablet 0  . Multiple Vitamin (MULTIVITAMIN WITH MINERALS) TABS tablet Take 1 tablet by mouth daily. 100 tablet 0  . nicotine (NICODERM CQ - DOSED IN MG/24 HOURS) 14 mg/24hr patch Place 1 patch (14 mg total) onto the skin daily. 28 patch 0  . sacubitril-valsartan (ENTRESTO) 97-103 MG Take 1 tablet by mouth 2 (two) times daily. 60 tablet 6  . spironolactone (ALDACTONE) 25 MG tablet Take 0.5 tablets (12.5 mg total) by mouth at bedtime. 30 tablet 5  . thiamine 100 MG tablet Take 1 tablet (100 mg total) by mouth daily. 30 tablet 0  . traZODone (DESYREL) 50 MG tablet Take 100-150 mg by mouth at bedtime as needed for sleep.    Marland Kitchen warfarin (COUMADIN) 2 MG tablet Take one pill with supper on all days except Saturday. Take one and a half pill on Saturdays. 35 tablet 0  . digoxin (LANOXIN) 0.125 MG tablet Take 1 tablet (0.125 mg total) by mouth daily. (Patient not taking: Reported on 12/17/2017) 30 tablet 5   No facility-administered medications prior to visit.     PAST MEDICAL HISTORY: Past Medical History:  Diagnosis Date  . ADHD (attention deficit hyperactivity disorder)   . Anxiety   . CVA (cerebral vascular accident) (HCC) 07/02/2017   "left sided weakness" (07/02/2017)  . Depression   . History of kidney stones     PAST SURGICAL HISTORY: Past Surgical History:  Procedure Laterality Date  . CYSTOSCOPY WITH URETEROSCOPY, STONE BASKETRY AND STENT PLACEMENT    . RIGHT/LEFT HEART CATH AND CORONARY ANGIOGRAPHY N/A 07/05/2017   Procedure: RIGHT/LEFT HEART CATH AND CORONARY ANGIOGRAPHY;  Surgeon: Dolores Patty, MD;  Location: [facility]ASIVE [facility];  Service: Cardiovascular;  Laterality: N/A;    FAMILY HISTORY: Family History  Problem Relation Age of  Onset  . Stroke Mother   . Colon cancer Mother   . Prostate cancer Father   . Seizures Neg Hx     SOCIAL HISTORY: Social History   Socioeconomic History  . Marital status: Married    Spouse name: Not on file  . Number of children: Not on file  . Years of education: Not on file  . Highest education level: Not on file  Occupational History  . Not on file  Social Needs  . Financial resource strain: Not on file  . Food insecurity:    Worry: Not on file    Inability: Not on file  . Transportation needs:    Medical: Not on file    Non-medical: Not on file  Tobacco Use  . Smoking status: Former Smoker    Packs/day: 0.00    Years: 0.00    Pack years: 0.00  . Smokeless tobacco: Never Used  Substance and Sexual Activity  . Alcohol use: Yes    Alcohol/week: 2.4 oz    Types: 4 Glasses of wine per week  . Drug use: Yes    Types: Marijuana, "Crack"  cocaine    Comment: 07/02/2017 "smokes marijuana a couple days/week" , "08/20/17 vapes marijuana juice"  . Sexual activity: Yes  Lifestyle  . Physical activity:    Days per week: Not on file    Minutes per session: Not on file  . Stress: Not on file  Relationships  . Social connections:    Talks on phone: Not on file    Gets together: Not on file    Attends religious service: Not on file    Active member of club or organization: Not on file    Attends meetings of clubs or organizations: Not on file    Relationship status: Not on file  . Intimate partner violence:    Fear of current or ex partner: Not on file    Emotionally abused: Not on file    Physically abused: Not on file    Forced sexual activity: Not on file  Other Topics Concern  . Not on file  Social History Narrative  . Not on file     PHYSICAL EXAM  Vitals:   12/17/17 1411  BP: 95/68  Pulse: 76  Weight: 197 lb 9.6 oz (89.6 kg)  Height: 5' 9.75" (1.772 m)   Body mass index is 28.56 kg/m.  Generalized: Well developed, in no acute distress  Head:  normocephalic and atraumatic,. Oropharynx benign  Neck: Supple, no carotid bruits  Cardiac: Regular rate rhythm, no murmur  Musculoskeletal: No deformity   Neurological examination   Mentation: Alert oriented to time, place, history taking. Attention span and concentration appropriate.  Follows all commands speech and language fluent.   Cranial nerve II-XII: Pupils were equal round reactive to light extraocular movements were full, vision loss  left upper and lower quadrant, mild left facial droop, hearing  was intact to finger rubbing bilaterally. Uvula tongue midline. head turning and shoulder shrug were normal and symmetric.Tongue protrusion into cheek strength was normal. Motor: normal bulk and tone, full strength in the BUE, BLE,  Sensory: normal and symmetric to light touch, pinprick, and  Vibration in the upper and lower extremities  Coordination: finger-nose-finger, heel-to-shin bilaterally, no dysmetria Reflexes: 3+ on the left, 2+ on the right, plantar responses were flexor bilaterally. Gait and Station: Rising up from seated position without assistance, normal stance,  moderate stride, good arm swing, smooth turning, able to perform tiptoe, and heel walking without difficulty. Tandem gait is unsteady.  No assistive device  DIAGNOSTIC DATA (LABS, IMAGING, TESTING) - I reviewed patient records, labs, notes, testing and imaging myself where available.  Lab Results  Component Value Date   WBC 9.4 12/17/2017   HGB 15.1 12/17/2017   HCT 47.1 12/17/2017   MCV 91.6 12/17/2017   PLT 362 12/17/2017      Component Value Date/Time   NA 139 12/17/2017 1058   K 4.2 12/17/2017 1058   CL 105 12/17/2017 1058   CO2 24 12/17/2017 1058   GLUCOSE 99 12/17/2017 1058   BUN 17 12/17/2017 1058   CREATININE 0.95 12/17/2017 1058   CALCIUM 9.8 12/17/2017 1058   PROT 6.7 07/07/2017 0223   ALBUMIN 3.8 07/07/2017 0223   AST 26 07/07/2017 0223   ALT 34 07/07/2017 0223   ALKPHOS 78 07/07/2017 0223    BILITOT 0.4 07/07/2017 0223   GFRNONAA >60 12/17/2017 1058   GFRAA >60 12/17/2017 1058   Lab Results  Component Value Date   CHOL 303 (H) 07/03/2017   HDL 31 (L) 07/03/2017   LDLCALC UNABLE TO CALCULATE IF TRIGLYCERIDE  OVER 400 mg/dL 14/78/2956   TRIG 213 (H) 07/03/2017   CHOLHDL 9.8 07/03/2017   Lab Results  Component Value Date   HGBA1C 6.0 (H) 07/03/2017    ASSESSMENT AND PLAN 47 year old male with history of smoker, anxiety, substance abuse admitted for left sided weakness and slurred speech.  MRI showed acute right pontine infarct, subacute right PCA infarct, and old left PCA infarct.  MRA showed right P2 stenosis and left siphon atherosclerosis.  CTA head and neck confirmed high-grade right P2 stenosis and bilateral carotid siphon atherosclerosis.  TTE showed EF 20-25%.  LDL unable to calculate due to high TG at 539, A1c 6.0.  UDS showed positive cocaine and THC.  His cardiomyopathy may likely due to substance abuse.   He is currently on Coumadin  and Lipitor 80 with fenofibrate.   Therapies concluded.  He has stopped smoking.  He also denies further cocaine abuse   PLAN: Stressed the importance of management of risk factors to prevent further stroke Continue Coumadin for secondary stroke prevention Maintain strict control of hypertension with blood pressure goal below 130/90,  continue antihypertensive medications Control of diabetes with hemoglobin A1c below 6.5 followed by primary care  Cholesterol with LDL cholesterol less than 70, followed by primary care,   continue statin drugs Lipitor and fenofibrate Continue exercise at least 30 minutes daily  eat healthy diet with whole grains,  fresh fruits and vegetables Continue follow-up with cardiology F/U in 6 months if stable will discharge Nilda Riggs, [facility], [facility], APRN  [facility] [address], [address] [place], [place] [address] [phone]

## 2017-12-17 ENCOUNTER — Ambulatory Visit (INDEPENDENT_AMBULATORY_CARE_PROVIDER_SITE_OTHER): Payer: Managed Care, Other (non HMO) | Admitting: *Deleted

## 2017-12-17 ENCOUNTER — Ambulatory Visit (HOSPITAL_BASED_OUTPATIENT_CLINIC_OR_DEPARTMENT_OTHER)
Admission: RE | Admit: 2017-12-17 | Discharge: 2017-12-17 | Disposition: A | Discharge status: Home / Self Care | Payer: Managed Care, Other (non HMO) | Source: Ambulatory Visit | Attending: Internal Medicine | Admitting: Internal Medicine

## 2017-12-17 ENCOUNTER — Ambulatory Visit (HOSPITAL_COMMUNITY)
Admission: RE | Admit: 2017-12-17 | Discharge: 2017-12-17 | Disposition: A | Discharge status: Home / Self Care | Payer: Managed Care, Other (non HMO) | Source: Ambulatory Visit | Attending: Internal Medicine | Admitting: Internal Medicine

## 2017-12-17 ENCOUNTER — Ambulatory Visit (INDEPENDENT_AMBULATORY_CARE_PROVIDER_SITE_OTHER): Payer: Managed Care, Other (non HMO) | Admitting: Nurse Practitioner

## 2017-12-17 ENCOUNTER — Encounter (HOSPITAL_COMMUNITY): Payer: Self-pay | Admitting: Internal Medicine

## 2017-12-17 ENCOUNTER — Encounter: Payer: Self-pay | Admitting: Nurse Practitioner

## 2017-12-17 VITALS — BP 95/68 | HR 76 | Ht 69.75 in | Wt 197.6 lb

## 2017-12-17 VITALS — BP 114/68 | HR 76 | Wt 197.1 lb

## 2017-12-17 DIAGNOSIS — E782 Mixed hyperlipidemia: Secondary | ICD-10-CM

## 2017-12-17 DIAGNOSIS — I2510 Atherosclerotic heart disease of native coronary artery without angina pectoris: Secondary | ICD-10-CM

## 2017-12-17 DIAGNOSIS — I6350 Cerebral infarction due to unspecified occlusion or stenosis of unspecified cerebral artery: Secondary | ICD-10-CM

## 2017-12-17 DIAGNOSIS — I255 Ischemic cardiomyopathy: Secondary | ICD-10-CM

## 2017-12-17 DIAGNOSIS — I639 Cerebral infarction, unspecified: Secondary | ICD-10-CM | POA: Diagnosis not present

## 2017-12-17 DIAGNOSIS — Z8673 Personal history of transient ischemic attack (TIA), and cerebral infarction without residual deficits: Secondary | ICD-10-CM | POA: Diagnosis not present

## 2017-12-17 DIAGNOSIS — I340 Nonrheumatic mitral (valve) insufficiency: Secondary | ICD-10-CM | POA: Diagnosis not present

## 2017-12-17 DIAGNOSIS — I5022 Chronic systolic (congestive) heart failure: Secondary | ICD-10-CM

## 2017-12-17 DIAGNOSIS — Z5181 Encounter for therapeutic drug level monitoring: Secondary | ICD-10-CM | POA: Diagnosis not present

## 2017-12-17 DIAGNOSIS — I236 Thrombosis of atrium, auricular appendage, and ventricle as current complications following acute myocardial infarction: Secondary | ICD-10-CM

## 2017-12-17 LAB — BASIC METABOLIC PANEL
Anion gap: 10 (ref 5–15)
BUN: 17 mg/dL (ref 6–20)
CALCIUM: 9.8 mg/dL (ref 8.9–10.3)
CHLORIDE: 105 mmol/L (ref 101–111)
CO2: 24 mmol/L (ref 22–32)
Creatinine, Ser: 0.95 mg/dL (ref 0.61–1.24)
GFR calc Af Amer: >60 mL/min (ref >60)
GFR calc non Af Amer: >60 mL/min (ref >60)
GLUCOSE: 99 mg/dL (ref 65–99)
POTASSIUM: 4.2 mmol/L (ref 3.5–5.1)
Sodium: 139 mmol/L (ref 135–145)

## 2017-12-17 LAB — CBC
HEMATOCRIT: 47.1 % (ref 39.0–52.0)
HEMOGLOBIN: 15.1 g/dL (ref 13.0–17.0)
MCH: 29.4 pg (ref 26.0–34.0)
MCHC: 32.1 g/dL (ref 30.0–36.0)
MCV: 91.6 fL (ref 78.0–100.0)
Platelets: 362 10*3/uL (ref 150–400)
RBC: 5.14 MIL/uL (ref 4.22–5.81)
RDW: 12.7 % (ref 11.5–15.5)
WBC: 9.4 10*3/uL (ref 4.0–10.5)

## 2017-12-17 LAB — POCT INR: INR: 2.4 (ref 2.0–3.0)

## 2017-12-17 MED ORDER — SACUBITRIL-VALSARTAN 97-103 MG PO TABS: 1.0000 | ORAL_TABLET | Freq: Two times a day (BID) | ORAL | 6 refills | Status: DC

## 2017-12-17 NOTE — Progress Notes (Signed)
  Echocardiogram 2D Echocardiogram has been performed.  Delcie Roch 12/17/2017, 9:58 AM

## 2017-12-17 NOTE — Patient Instructions (Signed)
Increase Entresto to 97/103 mg Twice daily   Labs today  Your physician recommends that you schedule a follow-up appointment in: 3 months  

## 2017-12-17 NOTE — Addendum Note (Signed)
Encounter addended by: Noralee Space, RN on: 12/17/2017 10:58 AM  Actions taken: Order list changed, Diagnosis association updated, Sign clinical note

## 2017-12-17 NOTE — Progress Notes (Signed)
Advanced Heart Failure Clinic Note   Primary Care: Dr. Susa Simmonds Primary Cardiologist: Dr. Gala Romney  HPI:  Angel Davies is a 47 y.o. male with PMH of HTN, ADHD, Obesity, anxiety, substance abuse, and depression.   Pt presented 07/02/2017 with CVA. UDS + for cocaine. Found to have new systolic CHF by echo.   Echo 07/02/17 with LVEF 20-25%, grade 2 DD, Trivial MR, Trivial TR -> HF team consulted.  With new CHF, pt underwent R/LHC as below that showed severe 3vD with no options for CABG/CAD. HF meds titrated as tolerated.  Admitted to CIR from 12/7 - 07/27/17 with marked improvement. Med titration somewhat limited due to soft pressures.   He presents today for HF follow up. Feeling much better. Exercising almost daily. The other day walked a total of 7.5 miles on his FitBit. Denies CP or SOB. Minimal neuro deficit (just can't feel left hand - strength is fine). No edema, orthopnea or PND. Not on any lasix. No bleeding with warfarin    Echo done today EF 30-35% No apical thrombus. Personally reviewed   Review of systems complete and found to be negative unless listed in HPI.    cMRI on 07/06/17 with 1) Severe LVE with EF 18% septal and apical akinesis diffuse hypokinesis 2) Apical thrombus measuring 11 mm x 8 mm 3) Full thickness scar involving the mid and distal septum and apex Subendocardial scar involving the basal inferior lateral wall  R/LHC 07/05/17  Mid RCA to Dist RCA lesion is 100% stenosed.  Dist Cx lesion is 100% stenosed.  Mid LAD lesion is 100% stenosed.  Prox Cx to Dist Cx lesion is 30% stenosed.  Findings:  Ao = 104/68 (84) LV = 110/9 RA = 1 RV = 19/3 PA = 19/9 (11) PCW = 5 Fick cardiac output/index = 4.3/2.1 PVR = 1.5 WU SVR = 2122 Ao sat = 96% PA sat = 64%, 66%  Assessment: 1. Severe CAD with left-dominant system and occluded mLAD 2. Ischemic CM with EF 25% by echo 3. Well compensated filling pressures with moderately reduced cardiac output  Past  Medical History:  Diagnosis Date  . ADHD (attention deficit hyperactivity disorder)   . Anxiety   . CVA (cerebral vascular accident) (HCC) 07/02/2017   "left sided weakness" (07/02/2017)  . Depression   . History of kidney stones    Current Outpatient Medications  Medication Sig Dispense Refill  . acetaminophen (TYLENOL) 500 MG tablet Take 1 tablet (500 mg total) by mouth every 6 (six) hours as needed for headache (pain). 30 tablet 0  . amphetamine-dextroamphetamine (ADDERALL) 20 MG tablet Take 20 mg by mouth 3 (three) times daily.    Marland Kitchen atorvastatin (LIPITOR) 80 MG tablet Take 1 tablet (80 mg total) by mouth daily at 6 PM. 30 tablet 5  . carvedilol (COREG) 6.25 MG tablet Take 1 tablet (6.25 mg total) by mouth 2 (two) times daily with a meal. 60 tablet 5  . digoxin (LANOXIN) 0.125 MG tablet Take 1 tablet (0.125 mg total) by mouth daily. 30 tablet 5  . fenofibrate 160 MG tablet Take 1 tablet (160 mg total) by mouth daily. 30 tablet 5  . folic acid (FOLVITE) 1 MG tablet Take 1 tablet (1 mg total) by mouth daily. 30 tablet 0  . Multiple Vitamin (MULTIVITAMIN WITH MINERALS) TABS tablet Take 1 tablet by mouth daily. 100 tablet 0  . nicotine (NICODERM CQ - DOSED IN MG/24 HOURS) 14 mg/24hr patch Place 1 patch (14 mg total) onto  the skin daily. 28 patch 0  . sacubitril-valsartan (ENTRESTO) 49-51 MG Take 1 tablet by mouth 2 (two) times daily. 60 tablet 3  . spironolactone (ALDACTONE) 25 MG tablet Take 0.5 tablets (12.5 mg total) by mouth at bedtime. 30 tablet 5  . thiamine 100 MG tablet Take 1 tablet (100 mg total) by mouth daily. 30 tablet 0  . traZODone (DESYREL) 50 MG tablet Take 100-150 mg by mouth at bedtime as needed for sleep.    Marland Kitchen warfarin (COUMADIN) 2 MG tablet Take one pill with supper on all days except Saturday. Take one and a half pill on Saturdays. 35 tablet 0   No current facility-administered medications for this encounter.    No Known Allergies  Social History   Socioeconomic  History  . Marital status: Married    Spouse name: Not on file  . Number of children: Not on file  . Years of education: Not on file  . Highest education level: Not on file  Occupational History  . Not on file  Social Needs  . Financial resource strain: Not on file  . Food insecurity:    Worry: Not on file    Inability: Not on file  . Transportation needs:    Medical: Not on file    Non-medical: Not on file  Tobacco Use  . Smoking status: Former Smoker    Packs/day: 0.00    Years: 0.00    Pack years: 0.00  . Smokeless tobacco: Never Used  Substance and Sexual Activity  . Alcohol use: Yes    Alcohol/week: 2.4 oz    Types: 4 Glasses of wine per week  . Drug use: Yes    Types: Marijuana, "Crack" cocaine    Comment: 07/02/2017 "smokes marijuana a couple days/week" , "08/20/17 vapes marijuana juice"  . Sexual activity: Yes  Lifestyle  . Physical activity:    Days per week: Not on file    Minutes per session: Not on file  . Stress: Not on file  Relationships  . Social connections:    Talks on phone: Not on file    Gets together: Not on file    Attends religious service: Not on file    Active member of club or organization: Not on file    Attends meetings of clubs or organizations: Not on file    Relationship status: Not on file  . Intimate partner violence:    Fear of current or ex partner: Not on file    Emotionally abused: Not on file    Physically abused: Not on file    Forced sexual activity: Not on file  Other Topics Concern  . Not on file  Social History Narrative  . Not on file   Family History  Problem Relation Age of Onset  . Stroke Mother   . Colon cancer Mother   . Prostate cancer Father   . Seizures Neg Hx    Vitals:   12/17/17 0952  BP: 114/68  Pulse: 76  SpO2: 97%  Weight: 197 lb 1.9 oz (89.4 kg)   Wt Readings from Last 3 Encounters:  12/17/17 197 lb 1.9 oz (89.4 kg)  09/19/17 205 lb 12.8 oz (93.4 kg)  08/27/17 195 lb (88.5 kg)   PHYSICAL  EXAM: General:  Well appearing. No resp difficulty HEENT: normal Neck: supple. no JVD. Carotids 2+ bilat; no bruits. No lymphadenopathy or thryomegaly appreciated. Cor: PMI nondisplaced. Regular rate & rhythm. No rubs, gallops or murmurs. Lungs: clear Abdomen: soft, nontender,  nondistended. No hepatosplenomegaly. No bruits or masses. Good bowel sounds. Extremities: no cyanosis, clubbing, rash, edema Neuro: alert & orientedx3, cranial nerves grossly intact. moves all 4 extremities w/o difficulty. Affect pleasant   ASSESSMENT & PLAN:  1. Chronic systolic CHF, ICM - Echo 07/02/17 with LVEF 20-25%, grade 2 DD, Trivial MR, Trivial TR - cMRI EF 18% - Bedside echo 30-35% - done personally today (improving) - NYHA I - Volume status looks good.  - Continue spiro 12.5 mg daily.  - Increase Entresto to 97/103 mg BID.  - Continue coreg 6.25 mg BID.  - Continue spiro 12.5 daily - EF today in 35% range. Discussed ICD but will hold off for now as EF getting better and NYHA I.  3. CAD - LHC 07/05/17 with severe CAD with left-dominant system and occluded mLAD. - Mostly distal CAD. No good targets for CABG or PCI. Continue medical therapy. Based on scar pattern on MRI, We are hopeful EF will improve at least moderately   - Continue statin. No ASA with coumadin - No s/s ischemia  3. CVA - MRI Head 07/02/17 R pontine infarct and sub acute R temporal occipital/PCA territory infarct, along with old left occipital lobe/PCA territory infarct. - Remains on coumadin. INR per coumadin clinic.If cardiac apex improves can consider switching to ASA/Plavix - Minimal deficits  4. Cocaine abuse - Continues to be abstinent  5. Alcohol abuse - 2 glasses of wine since recent hospitalization. Stressed importance of abstinence moving forward.   6. HLD - Continue atorvastatin 80 mg daily.  - Lipids per PCP.   Arvilla Meres, MD 12/17/17 10:32 AM

## 2017-12-17 NOTE — Patient Instructions (Signed)
Stressed the importance of management of risk factors to prevent further stroke Continue Coumadin for secondary stroke prevention Maintain strict control of hypertension with blood pressure goal below 130/90,  continue antihypertensive medications Control of diabetes with hemoglobin A1c below 6.5 followed by primary care  Cholesterol with LDL cholesterol less than 70, followed by primary care,   continue statin drugs Lipitor and fenofibrate Continue exercise at least 30 minutes daily  eat healthy diet with whole grains,  fresh fruits and vegetables Continue follow-up with cardiology F/U in 6 months if stable will discharge

## 2017-12-17 NOTE — Patient Instructions (Signed)
Description   Pt has been taking 1 tablet daily so this dose continued . Continue eating leafy green intake of 2 servings each week. Recheck INR in 4 weeks. Coumadin Clinic [phone] Main [phone]

## 2017-12-18 NOTE — Progress Notes (Signed)
Personally  participated in, made any corrections needed, and agree with history, physical, neuro exam,assessment and plan as stated above.    Antonia Ahern, MD [facility] 

## 2017-12-23 ENCOUNTER — Other Ambulatory Visit: Payer: Self-pay | Admitting: Physical Medicine and Rehabilitation

## 2018-01-14 ENCOUNTER — Encounter: Payer: Self-pay | Admitting: Physical Medicine & Rehabilitation

## 2018-01-14 ENCOUNTER — Ambulatory Visit (INDEPENDENT_AMBULATORY_CARE_PROVIDER_SITE_OTHER): Payer: Managed Care, Other (non HMO) | Admitting: Pharmacist

## 2018-01-14 ENCOUNTER — Encounter: Payer: Managed Care, Other (non HMO) | Attending: Physical Medicine & Rehabilitation

## 2018-01-14 ENCOUNTER — Ambulatory Visit (HOSPITAL_BASED_OUTPATIENT_CLINIC_OR_DEPARTMENT_OTHER): Payer: Managed Care, Other (non HMO) | Admitting: Physical Medicine & Rehabilitation

## 2018-01-14 VITALS — BP 118/79 | HR 72 | Ht 70.0 in | Wt 197.0 lb

## 2018-01-14 DIAGNOSIS — F1910 Other psychoactive substance abuse, uncomplicated: Secondary | ICD-10-CM | POA: Diagnosis not present

## 2018-01-14 DIAGNOSIS — I69354 Hemiplegia and hemiparesis following cerebral infarction affecting left non-dominant side: Secondary | ICD-10-CM | POA: Diagnosis not present

## 2018-01-14 DIAGNOSIS — F329 Major depressive disorder, single episode, unspecified: Secondary | ICD-10-CM | POA: Diagnosis not present

## 2018-01-14 DIAGNOSIS — H547 Unspecified visual loss: Secondary | ICD-10-CM | POA: Insufficient documentation

## 2018-01-14 DIAGNOSIS — I255 Ischemic cardiomyopathy: Secondary | ICD-10-CM

## 2018-01-14 DIAGNOSIS — F1020 Alcohol dependence, uncomplicated: Secondary | ICD-10-CM | POA: Insufficient documentation

## 2018-01-14 DIAGNOSIS — Z8673 Personal history of transient ischemic attack (TIA), and cerebral infarction without residual deficits: Secondary | ICD-10-CM | POA: Diagnosis not present

## 2018-01-14 DIAGNOSIS — Z5181 Encounter for therapeutic drug level monitoring: Secondary | ICD-10-CM | POA: Diagnosis not present

## 2018-01-14 DIAGNOSIS — F419 Anxiety disorder, unspecified: Secondary | ICD-10-CM | POA: Insufficient documentation

## 2018-01-14 DIAGNOSIS — Z87891 Personal history of nicotine dependence: Secondary | ICD-10-CM | POA: Insufficient documentation

## 2018-01-14 DIAGNOSIS — I236 Thrombosis of atrium, auricular appendage, and ventricle as current complications following acute myocardial infarction: Secondary | ICD-10-CM | POA: Diagnosis not present

## 2018-01-14 DIAGNOSIS — I6350 Cerebral infarction due to unspecified occlusion or stenosis of unspecified cerebral artery: Secondary | ICD-10-CM

## 2018-01-14 DIAGNOSIS — I2510 Atherosclerotic heart disease of native coronary artery without angina pectoris: Secondary | ICD-10-CM | POA: Diagnosis not present

## 2018-01-14 DIAGNOSIS — F909 Attention-deficit hyperactivity disorder, unspecified type: Secondary | ICD-10-CM | POA: Diagnosis not present

## 2018-01-14 LAB — POCT INR: INR: 2.4 (ref 2.0–3.0)

## 2018-01-14 NOTE — Progress Notes (Signed)
Subjective:    Patient ID: Angel Davies, male    DOB: [birth year], 47 y.o.   MRN: [id number] 47 y.o. male with history of ADHD, anxiety, depression/anxiety, fall 12/2016 with loss of peripheral vision and onset of HA, polysubtance abuse who was admitted on 07/02/17 with fall, slurred speech and left sided weakness. Patient reported that he was drinking ETOH and used cocaine the night before--UDS positive for cocaine and THC. MRI brain revealed "Acute RIGHT pontine infarct, Subacute small RIGHT temporal occipital lobe/PCA territory infarct and severe stenosis distal rigth P2 segment.  2D echo showed EF 20-25% with moderately dilated LV, akinesis of anterior and apical myocardium with grade 2 diastolic dysfunction.  HPI Recent repeat echocardiogram with ejection fraction of 25 to 30% performed by Dr. Milas Kocher Has completed outpatient therapy.  Patient indicates that his cardiology has advised him to participate in aerobic fitness program.  He initially tried a silver sneakers which was too easy for him.  He also tried an adult aerobics class which consisted mainly of women in their 30s but this was too strenuous he has not discussed cardiac Rehab with his cardiologist. Pain Inventory Average Pain 2 Pain Right Now 0 My pain is intermittent and aching  In the last 24 hours, has pain interfered with the following? General activity 2 Relation with others 0 Enjoyment of life 2 What TIME of day is your pain at its worst? morning Sleep (in general) Fair  Pain is worse with: unsure Pain improves with: na Relief from Meds: 0  Mobility walk without assistance ability to climb steps?  yes do you drive?  yes  Function not employed: date last employed 2001  Neuro/Psych numbness tingling confusion  Prior Studies Any changes since last visit?  no  Physicians involved in your care Any changes since last visit?  no   Family History  Problem Relation Age of Onset  . Stroke Mother   . Colon  cancer Mother   . Prostate cancer Father   . Seizures Neg Hx    Social History   Socioeconomic History  . Marital status: Married    Spouse name: Not on file  . Number of children: Not on file  . Years of education: Not on file  . Highest education level: Not on file  Occupational History  . Not on file  Social Needs  . Financial resource strain: Not on file  . Food insecurity:    Worry: Not on file    Inability: Not on file  . Transportation needs:    Medical: Not on file    Non-medical: Not on file  Tobacco Use  . Smoking status: Former Smoker    Packs/day: 0.00    Years: 0.00    Pack years: 0.00  . Smokeless tobacco: Never Used  Substance and Sexual Activity  . Alcohol use: Yes    Alcohol/week: 2.4 oz    Types: 4 Glasses of wine per week  . Drug use: Yes    Types: Marijuana, "Crack" cocaine    Comment: 07/02/2017 "smokes marijuana a couple days/week" , "08/20/17 vapes marijuana juice"  . Sexual activity: Yes  Lifestyle  . Physical activity:    Days per week: Not on file    Minutes per session: Not on file  . Stress: Not on file  Relationships  . Social connections:    Talks on phone: Not on file    Gets together: Not on file    Attends religious service: Not on file  Active member of club or organization: Not on file    Attends meetings of clubs or organizations: Not on file    Relationship status: Not on file  Other Topics Concern  . Not on file  Social History Narrative  . Not on file   Past Surgical History:  Procedure Laterality Date  . CYSTOSCOPY WITH URETEROSCOPY, STONE BASKETRY AND STENT PLACEMENT    . RIGHT/LEFT HEART CATH AND CORONARY ANGIOGRAPHY N/A 07/05/2017   Procedure: RIGHT/LEFT HEART CATH AND CORONARY ANGIOGRAPHY;  Surgeon: Dolores PattyBensimhon, Daniel R, MD;  Location: [facility];  Service: Cardiovascular;  Laterality: N/A;   Past Medical History:  Diagnosis Date  . ADHD (attention deficit hyperactivity disorder)   . Anxiety   . CVA  (cerebral vascular accident) (HCC) 07/02/2017   "left sided weakness" (07/02/2017)  . Depression   . History of kidney stones    BP 118/79   Pulse 72   Ht 5\' 10"  (1.778 m)   Wt 197 lb (89.4 kg)   SpO2 95%   BMI 28.27 kg/m   Opioid Risk Score:   Fall Risk Score:  `1  Depression screen PHQ 2/9  Depression screen PHQ 2/9 08/13/2017  Decreased Interest 0  Down, Depressed, Hopeless 0  PHQ - 2 Score 0    Review of Systems  Constitutional: Negative.   HENT: Negative.   Eyes: Negative.   Respiratory: Negative.   Cardiovascular: Negative.   Gastrointestinal: Negative.   Endocrine:       High blood sugar   Genitourinary: Negative.   Musculoskeletal: Negative.   Skin: Negative.   Allergic/Immunologic: Negative.   Neurological: Positive for numbness.  Hematological: Negative.   Psychiatric/Behavioral: Positive for confusion.  All other systems reviewed and are negative.      Objective:   Physical Exam  Constitutional: He is oriented to person, place, and time. He appears well-developed and well-nourished.  HENT:  Head: Normocephalic and atraumatic.  Eyes: Pupils are equal, round, and reactive to light. EOM are normal.  Neck: Normal range of motion. Neck supple.  Neurological: He is alert and oriented to person, place, and time.  No evidence of nystagmus no evidence of extraocular muscle weakness Visual fields are intact to confrontation testing Motor strength is 5/5 bilateral deltoid, bicep, tricep, grip, hip flexor, knee extensor, ankle dorsiflexor plantar flexor No evidence of dysdiadochokinesis with rapid alternating supination pronation of bilateral upper extremities. Ambulates without assistive device no evidence of toe drag or knee instability.  He is able to toe walk as well as perform tandem gait.  Skin: Skin is warm and dry.  Nursing note and vitals reviewed.         Assessment & Plan:  1.  History of right pontine infarct his left hemiparesis has largely  resolved.  He does have some minimal fine motor deficits which are difficult to appreciate on examination but the patient has noted some subtle problems. He is also had bilateral PCA infarcts in the past which can cause some cognitive deficits such as memory and attention.  He has noted some problems with this as well. We discussed a group exercise program for him.  I think he may qualify from a cardiology standpoint for cardiac rehab.  This would be helpful with some further education on risk factor reduction which would also help with stroke risk reduction. I will see him back on a as needed basis he will follow-up with PCP as well as neurology as well as cardiology

## 2018-01-14 NOTE — Patient Instructions (Signed)
Description   Pt has been taking 1 tablet daily so this dose continued . Continue eating leafy green intake of 2 servings each week. Recheck INR in 4 weeks. [facility] [phone] Main [phone]

## 2018-01-14 NOTE — Patient Instructions (Signed)
Right pontine stroke caused the left sided weakness  The prior strokes were posterior cerebral artery which can cause memory problems

## 2018-01-16 ENCOUNTER — Telehealth (HOSPITAL_COMMUNITY): Payer: Self-pay | Admitting: *Deleted

## 2018-01-16 DIAGNOSIS — I5022 Chronic systolic (congestive) heart failure: Secondary | ICD-10-CM

## 2018-01-16 NOTE — Telephone Encounter (Signed)
Pt will qualify for cardiac rehab with low EF, referral placed, pt is aware and will await a call from rehab to schedule.

## 2018-01-16 NOTE — Telephone Encounter (Signed)
-----   Message from Dolores Pattyaniel R Bensimhon, MD sent at 01/14/2018  6:48 PM EDT ----- Yep.   Herbert SetaHeather - can you see if Mr. Jacqlyn LarsenBranly will qualify [facility]?  Thanks -dan  ----- Message ----- From: Erick ColaceKirsteins, Andrew E, MD Sent: 01/14/2018   6:31 PM To: Dolores Pattyaniel R Bensimhon, MD  I would favor the cardiac rehab.  There is been studies showing some improvements with secondary stroke prevention in patients who have gone through cardiac rehab.  I have never referred anybody.  Is that something you can do? Thanks Mardelle MatteAndy ----- Message ----- From: Dolores PattyBensimhon, Daniel R, MD Sent: 01/14/2018   5:57 PM To: Erick ColaceAndrew E Kirsteins, MD  I think either would be fine. Do you have a preference?  Thanks  ----- Message ----- From: Erick ColaceKirsteins, Andrew E, MD Sent: 01/14/2018   3:03 PM To: Dolores Pattyaniel R Bensimhon, MD  Do you rec Cardiac rehab or gym based aerobic exercise class?  Thanks  Mardelle MatteAndy

## 2018-01-30 ENCOUNTER — Telehealth (HOSPITAL_COMMUNITY): Payer: Self-pay

## 2018-01-30 NOTE — Telephone Encounter (Signed)
Pt insurance is active and benefits verified through [place]. Co-pay $0.00, DED $1,500.00/$1,500.00 met, out of pocket $3,500.00/$3,500.00 met, co-insurance 20%. No pre-authorization required. Passport, 01/30/18 @ 7:48AM, SFS#[id number]  Will contact patient to see if he is interested in the Cardiac Rehab Program. If interested, patient will need to complete follow up appt. Once completed, patient will be contacted for scheduling upon review by the RN Navigator.

## 2018-01-30 NOTE — Telephone Encounter (Signed)
Called patient to see if he was interested in participating in the Cardiac Rehab Program. Patient stated yes. Patient will come in for orientation on 03/12/18 @ 8:30AM and will attend the 9:45AM exercise class.  Mailed homework package.

## 2018-03-06 ENCOUNTER — Telehealth (HOSPITAL_COMMUNITY): Payer: Self-pay | Admitting: Pharmacist

## 2018-03-06 NOTE — Telephone Encounter (Signed)
Cardiac Rehab Medication Review by a Pharmacist  Does the patient  feel that his/her medications are working for him/her?  yes  Has the patient been experiencing any side effects to the medications prescribed?  yes - Patient says his muscles are "crampy", but he expected this with this high dose atorvastatin Does the patient measure his/her own blood pressure or blood glucose at home?  no   Does the patient have any problems obtaining medications due to transportation or finances?   no  Understanding of regimen: good Understanding of indications: good Potential of compliance: good  Pharmacist comments:  Patient appears compliant of medications. He has been switched from warfarin to apixaban, which is the only major medication change at this time.    Otis Peakebecca M Alexya Mcdaris  PGY1 Pharmacy Resident Pager: [phone] 03/06/2018 2:01 PM

## 2018-03-12 ENCOUNTER — Encounter (HOSPITAL_COMMUNITY)
Admission: RE | Admit: 2018-03-12 | Discharge: 2018-03-12 | Disposition: A | Discharge status: Home / Self Care | Payer: Managed Care, Other (non HMO) | Source: Ambulatory Visit | Attending: Internal Medicine | Admitting: Internal Medicine

## 2018-03-12 VITALS — Ht 69.0 in | Wt 191.6 lb

## 2018-03-12 DIAGNOSIS — Z79899 Other long term (current) drug therapy: Secondary | ICD-10-CM | POA: Diagnosis not present

## 2018-03-12 DIAGNOSIS — F419 Anxiety disorder, unspecified: Secondary | ICD-10-CM | POA: Insufficient documentation

## 2018-03-12 DIAGNOSIS — F909 Attention-deficit hyperactivity disorder, unspecified type: Secondary | ICD-10-CM | POA: Insufficient documentation

## 2018-03-12 DIAGNOSIS — Z8673 Personal history of transient ischemic attack (TIA), and cerebral infarction without residual deficits: Secondary | ICD-10-CM | POA: Diagnosis not present

## 2018-03-12 DIAGNOSIS — Z7901 Long term (current) use of anticoagulants: Secondary | ICD-10-CM | POA: Insufficient documentation

## 2018-03-12 DIAGNOSIS — Z87891 Personal history of nicotine dependence: Secondary | ICD-10-CM | POA: Insufficient documentation

## 2018-03-12 DIAGNOSIS — F329 Major depressive disorder, single episode, unspecified: Secondary | ICD-10-CM | POA: Insufficient documentation

## 2018-03-12 DIAGNOSIS — I5022 Chronic systolic (congestive) heart failure: Secondary | ICD-10-CM

## 2018-03-12 DIAGNOSIS — Z87442 Personal history of urinary calculi: Secondary | ICD-10-CM | POA: Insufficient documentation

## 2018-03-12 NOTE — Progress Notes (Signed)
Cardiac Individual Treatment Plan  Patient Details  Name: HANSON MEDEIROS MRN: [id number] Date of Birth: 22-Feb-1971 Referring Provider:   Flowsheet Row CARDIAC REHAB PHASE II ORIENTATION from 03/12/2018 in [facility] CARDIAC [facility]  Referring Provider  Lauro Franklin MD      Initial Encounter Date:  Flowsheet Row CARDIAC REHAB PHASE II ORIENTATION from 03/12/2018 in [facility] CARDIAC REHAB  Date  03/12/18      Visit Diagnosis: Heart failure, chronic systolic (HCC)  Patient's Home Medications on Admission:  Current Outpatient Medications:  .  amphetamine-dextroamphetamine (ADDERALL) 20 MG tablet, Take 20 mg by mouth 3 (three) times daily., Disp: , Rfl:  .  apixaban (ELIQUIS) 5 MG TABS tablet, Take 5 mg by mouth 2 (two) times daily., Disp: , Rfl:  .  atorvastatin (LIPITOR) 80 MG tablet, Take 1 tablet (80 mg total) by mouth daily at 6 PM., Disp: 30 tablet, Rfl: 5 .  carvedilol (COREG) 6.25 MG tablet, Take 1 tablet (6.25 mg total) by mouth 2 (two) times daily with a meal., Disp: 60 tablet, Rfl: 5 .  fenofibrate 160 MG tablet, Take 1 tablet (160 mg total) by mouth daily., Disp: 30 tablet, Rfl: 5 .  folic acid (FOLVITE) 1 MG tablet, Take 1 tablet (1 mg total) by mouth daily., Disp: 30 tablet, Rfl: 0 .  Multiple Vitamin (MULTIVITAMIN WITH MINERALS) TABS tablet, Take 1 tablet by mouth daily. (Patient not taking: Reported on 03/06/2018), Disp: 100 tablet, Rfl: 0 .  nicotine (NICODERM CQ - DOSED IN MG/24 HOURS) 14 mg/24hr patch, Place 1 patch (14 mg total) onto the skin daily., Disp: 28 patch, Rfl: 0 .  sacubitril-valsartan (ENTRESTO) 97-103 MG, Take 1 tablet by mouth 2 (two) times daily., Disp: 60 tablet, Rfl: 6 .  spironolactone (ALDACTONE) 25 MG tablet, Take 0.5 tablets (12.5 mg total) by mouth at bedtime., Disp: 30 tablet, Rfl: 5 .  thiamine 100 MG tablet, Take 1 tablet (100 mg total) by mouth daily. (Patient not taking: Reported on 03/06/2018), Disp: 30  tablet, Rfl: 0 .  traMADol (ULTRAM) 50 MG tablet, Take 50 mg by mouth as needed for moderate pain (Every 4-6 hours PRN)., Disp: , Rfl:  .  traZODone (DESYREL) 50 MG tablet, Take 100-150 mg by mouth at bedtime as needed for sleep., Disp: , Rfl:  .  warfarin (COUMADIN) 2 MG tablet, Take one pill with supper on all days except Saturday. Take one and a half pill on Saturdays. (Patient not taking: Reported on 03/06/2018), Disp: 35 tablet, Rfl: 0  Past Medical History: Past Medical History:  Diagnosis Date  . ADHD (attention deficit hyperactivity disorder)   . Anxiety   . CVA (cerebral vascular accident) (HCC) 07/02/2017   "left sided weakness" (07/02/2017)  . Depression   . History of kidney stones     Tobacco Use: Social History   Tobacco Use  Smoking Status Former Smoker  . Packs/day: 0.00  . Years: 0.00  . Pack years: 0.00  Smokeless Tobacco Never Used    Labs: Recent Review Flowsheet Data    Labs for ITP Cardiac and Pulmonary Rehab Latest Ref Rng & Units 07/02/2017 07/03/2017 07/05/2017 07/05/2017 07/05/2017   Cholestrol 0 - 200 mg/dL - 782(N) - - -   LDLCALC 0 - 99 mg/dL - UNABLE TO CALCULATE IF TRIGLYCERIDE OVER 400 mg/dL - - -   HDL >56 mg/dL - 21(H) - - -   Trlycerides <150 mg/dL - 086(V) - - -   Hemoglobin  A1c 4.8 - 5.6 % - 6.0(H) - - -   PHART 7.350 - 7.450 - - 7.408 - -   PCO2ART 32.0 - 48.0 mmHg - - 32.9 - -   HCO3 20.0 - 28.0 mmol/L - - 20.7 24.2 24.3   TCO2 22 - 32 mmol/L 21(L) - 22 25 26    ACIDBASEDEF 0.0 - 2.0 mmol/L - - 3.0(H) 1.0 1.0   O2SAT % - - 95.0 66.0 67.0      Capillary Blood Glucose: Lab Results  Component Value Date   GLUCAP 115 (H) 07/05/2017   GLUCAP 113 (H) 07/04/2017   GLUCAP 147 (H) 07/02/2017     Exercise Target Goals: Exercise Program Goal: Individual exercise prescription set using results from initial 6 min walk test and THRR while considering  patient's activity barriers and safety.   Exercise Prescription Goal: Initial exercise  prescription builds to 30-45 minutes a day of aerobic activity, 2-3 days per week.  Home exercise guidelines will be given to patient during program as part of exercise prescription that the participant will acknowledge.  Activity Barriers & Risk Stratification: Activity Barriers & Cardiac Risk Stratification - 03/12/18 1152    Activity Barriers & Cardiac Risk Stratification          Activity Barriers  Back Problems;Deconditioning;Muscular Weakness;Other (comment)    Comments  stroke (L side compromise)    Cardiac Risk Stratification  High           6 Minute Walk: 6 Minute Walk    6 Minute Walk    Row Name 03/12/18 1149   Phase  Initial   Distance  1426 feet   Walk Time  5.34 minutes   # of Rest Breaks  0   MPH  3   METS  4.1   RPE  13   VO2 Peak  14.5   Symptoms  Yes (comment)   Comments  fatigue! Stopped test at 5:34; c/o B shin discomfort/ resolved with rest   Resting HR  81 bpm   Resting BP  100/60   Resting Oxygen Saturation   97 %   Exercise Oxygen Saturation  during 6 min walk  98 %   Max Ex. HR  101 bpm   Max Ex. BP  108/70   2 Minute Post BP  97/69          Oxygen Initial Assessment:   Oxygen Re-Evaluation:   Oxygen Discharge (Final Oxygen Re-Evaluation):   Initial Exercise Prescription: Initial Exercise Prescription - 03/12/18 1100    Date of Initial Exercise RX and Referring Provider          Date  03/12/18    Referring Provider  Lauro FranklinBesimhon, Daniel MD        Recumbant Bike          Level  2    Minutes  10    METs  2        NuStep          Level  3    SPM  80    Minutes  10    METs  2        Track          Laps  10    Minutes  10    METs  2.74        Prescription Details          Frequency (times per week)  3    Duration  Progress to 30 minutes  of continuous aerobic without signs/symptoms of physical distress        Intensity          THRR 40-80% of Max Heartrate  69-138    Ratings of Perceived Exertion  11-13     Perceived Dyspnea  0-4        Progression          Progression  Continue to progress workloads to maintain intensity without signs/symptoms of physical distress.        Resistance Training          Training Prescription  Yes    Weight  4lbs    Reps  10-15           Perform Capillary Blood Glucose checks as needed.  Exercise Prescription Changes:   Exercise Comments:   Exercise Goals and Review: Exercise Goals    Exercise Goals    Row Name 03/12/18 1155   Increase Physical Activity  Yes   Intervention  Provide advice, education, support and counseling about physical activity/exercise needs.;Develop an individualized exercise prescription for aerobic and resistive training based on initial evaluation findings, risk stratification, comorbidities and participant's personal goals.   Expected Outcomes  Short Term: Attend rehab on a regular basis to increase amount of physical activity.;Long Term: Add in home exercise to make exercise part of routine and to increase amount of physical activity.;Long Term: Exercising regularly at least 3-5 days a week.   Increase Strength and Stamina  Yes   Intervention  Provide advice, education, support and counseling about physical activity/exercise needs.;Develop an individualized exercise prescription for aerobic and resistive training based on initial evaluation findings, risk stratification, comorbidities and participant's personal goals.   Expected Outcomes  Short Term: Increase workloads from initial exercise prescription for resistance, speed, and METs.;Long Term: Improve cardiorespiratory fitness, muscular endurance and strength as measured by increased METs and functional capacity ( );Short Term: Perform resistance training exercises routinely during rehab and add in resistance training at home   Able to understand and use rate of perceived exertion (RPE) scale  Yes   Intervention  Provide education and explanation on how to use RPE scale    Expected Outcomes  Short Term: Able to use RPE daily in rehab to express subjective intensity level;Long Term:  Able to use RPE to guide intensity level when exercising independently   Knowledge and understanding of Target Heart Rate Range (THRR)  Yes   Intervention  Provide education and explanation of THRR including how the numbers were predicted and where they are located for reference   Expected Outcomes  Short Term: Able to state/look up THRR;Short Term: Able to use daily as guideline for intensity in rehab;Long Term: Able to use THRR to govern intensity when exercising independently   Able to check pulse independently  Yes   Intervention  Review the importance of being able to check your own pulse for safety during independent exercise;Provide education and demonstration on how to check pulse in carotid and radial arteries.   Expected Outcomes  Short Term: Able to explain why pulse checking is important during independent exercise;Long Term: Able to check pulse independently and accurately   Understanding of Exercise Prescription  Yes   Intervention  Provide education, explanation, and written materials on patient's individual exercise prescription   Expected Outcomes  Short Term: Able to explain program exercise prescription;Long Term: Able to explain home exercise prescription to exercise independently          Exercise Goals Re-Evaluation :  Discharge Exercise Prescription (Final Exercise Prescription Changes):   Nutrition:  Target Goals: Understanding of nutrition guidelines, daily intake of sodium 1500mg , cholesterol 200mg , calories 30% from fat and 7% or less from saturated fats, daily to have 5 or more servings of fruits and vegetables.  Biometrics: Pre Biometrics - 03/12/18 1155    Pre Biometrics          Height  5\' 9"  (1.753 m)    Weight  86.9 kg    Waist Circumference  38.5 inches    Hip Circumference  38.5 inches    Waist to Hip Ratio  1 %    BMI (Calculated)   28.28    Triceps Skinfold  20 mm    % Body Fat  27.5 %    Grip Strength  47 kg    Flexibility  12 in    Single Leg Stand  30 seconds            Nutrition Therapy Plan and Nutrition Goals:   Nutrition Assessments:   Nutrition Goals Re-Evaluation:   Nutrition Goals Re-Evaluation:   Nutrition Goals Discharge (Final Nutrition Goals Re-Evaluation):   Psychosocial: Target Goals: Acknowledge presence or absence of significant depression and/or stress, maximize coping skills, provide positive support system. Participant is able to verbalize types and ability to use techniques and skills needed for reducing stress and depression.  Initial Review & Psychosocial Screening: Initial Psych Review & Screening - 03/12/18 1508    Initial Review          Current issues with  (Significant) Current Anxiety/Panic   health related anxiety       Family Dynamics          Good Support System?  Yes   spouse       Barriers          Psychosocial barriers to participate in program  The patient should benefit from training in stress management and relaxation.        Screening Interventions          Interventions  Encouraged to exercise;Program counselor consult;To provide support and resources with identified psychosocial needs;Provide feedback about the scores to participant    Expected Outcomes  Short Term goal: Utilizing psychosocial counselor, staff and physician to assist with identification of specific Stressors or current issues interfering with healing process. Setting desired goal for each stressor or current issue identified.;Short Term goal: Identification and review with participant of any Quality of Life or Depression concerns found by scoring the questionnaire.           Quality of Life Scores: Quality of Life - 03/12/18 1256    Quality of Life          Select  Quality of Life        Quality of Life Scores          Health/Function Pre  15.67 %    Socioeconomic Pre   9.79 %    Psych/Spiritual Pre  15.93 %    Family Pre  22.8 %    GLOBAL Pre  15.56 %          Scores of 19 and below usually indicate a poorer quality of life in these areas.  A difference of  2-3 points is a clinically meaningful difference.  A difference of 2-3 points in the total score of the Quality of Life Index has been associated with significant improvement in overall quality of life, self-image, physical symptoms, and general health in  studies assessing change in quality of life.  PHQ-9: Recent Review Flowsheet Data    Depression screen [facility] 2/9 08/13/2017   Decreased Interest 0   Down, Depressed, Hopeless 0   PHQ - 2 Score 0     Interpretation of Total Score  Total Score Depression Severity:  1-4 = Minimal depression, 5-9 = Mild depression, 10-14 = Moderate depression, 15-19 = Moderately severe depression, 20-27 = Severe depression   Psychosocial Evaluation and Intervention:   Psychosocial Re-Evaluation:   Psychosocial Discharge (Final Psychosocial Re-Evaluation):   Vocational Rehabilitation: Provide vocational rehab assistance to qualifying candidates.   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 Participant will state understanding/return demonstration of topics presented.  Learning Barriers/Preferences: Learning Barriers/Preferences - 03/12/18 1250    Learning Barriers/Preferences          Learning Barriers  Exercise Concerns   recovering from stroke, L-sided weakness   Learning Preferences  Skilled Demonstration           Education Topics: Count Your Pulse:  -Group instruction provided by verbal instruction, demonstration,  patient participation and written materials to support subject.  Instructors address importance of being able to find your pulse and how to count your pulse when at home without a heart monitor.  Patients get hands on experience counting their pulse with staff help and individually.   Heart Attack, Angina, and Risk Factor Modification:  -Group instruction provided by verbal instruction, video, and written materials to support subject.  Instructors address signs and symptoms of angina and heart attacks.    Also discuss risk factors for heart disease and how to make changes to improve heart health risk factors.   Functional Fitness:  -Group instruction provided by verbal instruction, demonstration, patient participation, and written materials to support subject.  Instructors address safety measures for doing things around the house.  Discuss how to get up and down off the floor, how to pick things up properly, how to safely get out of a chair without assistance, and balance training.   Meditation and Mindfulness:  -Group instruction provided by verbal instruction, patient participation, and written materials to support subject.  Instructor addresses importance of mindfulness and meditation practice to help reduce stress and improve awareness.  Instructor also leads participants through a meditation exercise.    Stretching for Flexibility and Mobility:  -Group instruction provided by verbal instruction, patient participation, and written materials to support subject.  Instructors lead participants through series of stretches that are designed to increase flexibility thus improving mobility.  These stretches are additional exercise for major muscle groups that are typically performed during regular warm up and cool down.   Hands Only CPR:  -Group verbal, video, and participation provides a basic overview of AHA guidelines for community CPR. Role-play of emergencies allow participants the opportunity  to practice calling for help and chest compression technique with discussion of AED use.   Hypertension: -Group verbal and written instruction that provides a basic overview of hypertension including the most recent diagnostic guidelines, risk factor reduction with self-care instructions and medication management.    Nutrition I class: Heart Healthy Eating:  -Group  instruction provided by PowerPoint slides, verbal discussion, and written materials to support subject matter. The instructor gives an explanation and review of the Therapeutic Lifestyle Changes diet recommendations, which includes a discussion on lipid goals, dietary fat, sodium, fiber, plant stanol/sterol esters, sugar, and the components of a well-balanced, healthy diet.   Nutrition II class: Lifestyle Skills:  -Group instruction provided by PowerPoint slides, verbal discussion, and written materials to support subject matter. The instructor gives an explanation and review of label reading, grocery shopping for heart health, heart healthy recipe modifications, and ways to make healthier choices when eating out.   Diabetes Question & Answer:  -Group instruction provided by PowerPoint slides, verbal discussion, and written materials to support subject matter. The instructor gives an explanation and review of diabetes co-morbidities, pre- and post-prandial blood glucose goals, pre-exercise blood glucose goals, signs, symptoms, and treatment of hypoglycemia and hyperglycemia, and foot care basics.   Diabetes Blitz:  -Group instruction provided by PowerPoint slides, verbal discussion, and written materials to support subject matter. The instructor gives an explanation and review of the physiology behind type 1 and type 2 diabetes, diabetes medications and rational behind using different medications, pre- and post-prandial blood glucose recommendations and Hemoglobin A1c goals, diabetes diet, and exercise including blood glucose  guidelines for exercising safely.    Portion Distortion:  -Group instruction provided by PowerPoint slides, verbal discussion, written materials, and food models to support subject matter. The instructor gives an explanation of serving size versus portion size, changes in portions sizes over the last 20 years, and what consists of a serving from each food group.   Stress Management:  -Group instruction provided by verbal instruction, video, and written materials to support subject matter.  Instructors review role of stress in heart disease and how to cope with stress positively.     Exercising on Your Own:  -Group instruction provided by verbal instruction, power point, and written materials to support subject.  Instructors discuss benefits of exercise, components of exercise, frequency and intensity of exercise, and end points for exercise.  Also discuss use of nitroglycerin and activating EMS.  Review options of places to exercise outside of rehab.  Review guidelines for sex with heart disease.   Cardiac Drugs I:  -Group instruction provided by verbal instruction and written materials to support subject.  Instructor reviews cardiac drug classes: antiplatelets, anticoagulants, beta blockers, and statins.  Instructor discusses reasons, side effects, and lifestyle considerations for each drug class.   Cardiac Drugs II:  -Group instruction provided by verbal instruction and written materials to support subject.  Instructor reviews cardiac drug classes: angiotensin converting enzyme inhibitors (ACE-I), angiotensin II receptor blockers (ARBs), nitrates, and calcium channel blockers.  Instructor discusses reasons, side effects, and lifestyle considerations for each drug class.   Anatomy and Physiology of the Circulatory System:  Group verbal and written instruction and models provide basic cardiac anatomy and physiology, with the coronary electrical and arterial systems. Review of: AMI, Angina,  Valve disease, Heart Failure, Peripheral Artery Disease, Cardiac Arrhythmia, Pacemakers, and the ICD.   Other Education:  -Group or individual verbal, written, or video instructions that support the educational goals of the cardiac rehab program.   Holiday Eating Survival Tips:  -Group instruction provided by PowerPoint slides, verbal discussion, and written materials to support subject matter. The instructor gives patients tips, tricks, and techniques to help them not only survive but enjoy the holidays despite the onslaught of food that accompanies the holidays.   Knowledge Questionnaire Score:  Knowledge Questionnaire Score - 03/12/18 1251    Knowledge Questionnaire Score          Pre Score  23/28           Core Components/Risk Factors/Patient Goals at Admission: Personal Goals and Risk Factors at Admission - 03/12/18 1300    Core Components/Risk Factors/Patient Goals on Admission           Weight Management  Yes;Weight Maintenance    Intervention  Weight Management: Provide education and appropriate resources to help participant work on and attain dietary goals.;Weight Management: Develop a combined nutrition and exercise program designed to reach desired caloric intake, while maintaining appropriate intake of nutrient and fiber, sodium and fats, and appropriate energy expenditure required for the weight goal.    Expected Outcomes  Short Term: Continue to assess and modify interventions until short term weight is achieved;Long Term: Adherence to nutrition and physical activity/exercise program aimed toward attainment of established weight goal;Weight Maintenance: Understanding of the daily nutrition guidelines, which includes 25-35% calories from fat, 7% or less cal from saturated fats, less than 200mg  cholesterol, less than 1.5gm of sodium, & 5 or more servings of fruits and vegetables daily;Understanding recommendations for meals to include 15-35% energy as protein, 25-35% energy from  fat, 35-60% energy from carbohydrates, less than 200mg  of dietary cholesterol, 20-35 gm of total fiber daily;Understanding of distribution of calorie intake throughout the day with the consumption of 4-5 meals/snacks    Lipids  Yes    Intervention  Provide education and support for participant on nutrition & aerobic/resistive exercise along with prescribed medications to achieve LDL 70mg , HDL >40mg .    Expected Outcomes  Short Term: Participant states understanding of desired cholesterol values and is compliant with medications prescribed. Participant is following exercise prescription and nutrition guidelines.;Long Term: Cholesterol controlled with medications as prescribed, with individualized exercise RX and with personalized nutrition plan. Value goals: LDL < 70mg , HDL > 40 mg.           Core Components/Risk Factors/Patient Goals Review:    Core Components/Risk Factors/Patient Goals at Discharge (Final Review):    ITP Comments: ITP Comments    Row Name 03/12/18 0935   ITP Comments  Dr. Armanda Magic, Medical Director      Comments: Patient attended orientation from 0919 to 1110 to review rules and guidelines for program. Completed 6 minute walk test, Intitial ITP, and exercise prescription. Telemetry-sinus rhythm.  Pt hypotensive post exercise, lowest BP:  86/65.  Pt given gatorade with improvement, recheck BP 97/69.   Asymptomatic.  Dr. Gala Romney made aware.   Deveron Furlong, RN, BSN Cardiac Pulmonary Rehab 03/12/18 4:16 PM

## 2018-03-13 ENCOUNTER — Telehealth (HOSPITAL_COMMUNITY): Payer: Self-pay | Admitting: Cardiac Rehabilitation

## 2018-03-13 NOTE — Telephone Encounter (Signed)
-----   Message from Dolores Pattyaniel R Bensimhon, MD sent at 03/12/2018  6:38 PM EDT ----- Regarding: RE: cardiac rehab  Ok to exercise as long as SBP > = 85  ----- Message ----- From: Robyne Peersion, Ashtyn Meland H, RN Sent: 03/12/2018  12:23 PM EDT To: Dolores Pattyaniel R Bensimhon, MD Subject: cardiac rehab                                  Dear Dr. Gala RomneyBensimhon,  Pt here to day for cardiac rehab orientation. Pt hypotensive at check out.  BP:  86/65 sitting and standing. Pt given gatorade.  Recheck BP:  97/69 sitting and standing.  Pt asymptomatic.  6 minute walk test stopped at 5:34 for shin pain and fatigue.    Please indicate BP parameters for rest and exercise.    Thank you, Deveron FurlongJoann Sadia Belfiore, RN, BSN Cardiac Pulmonary Rehab

## 2018-03-18 ENCOUNTER — Encounter (HOSPITAL_COMMUNITY)
Admission: RE | Admit: 2018-03-18 | Discharge: 2018-03-18 | Disposition: A | Discharge status: Home / Self Care | Payer: Managed Care, Other (non HMO) | Source: Ambulatory Visit | Attending: Internal Medicine | Admitting: Internal Medicine

## 2018-03-18 ENCOUNTER — Encounter (HOSPITAL_COMMUNITY): Payer: Managed Care, Other (non HMO)

## 2018-03-18 DIAGNOSIS — I5022 Chronic systolic (congestive) heart failure: Secondary | ICD-10-CM

## 2018-03-18 NOTE — Progress Notes (Signed)
Daily Session Note  Patient Details  Name: Angel Davies MRN: [id number] Date of Birth: 12/18/1970 Referring Provider:     CARDIAC REHAB PHASE II ORIENTATION from 03/12/2018 in [place]  Referring Provider  Larita Fife MD      Encounter Date: 03/18/2018  Check In: Session Check In - 03/18/18 1055      Check-In   Supervising physician immediately available to respond to emergencies  [facility] immediately available    Physician(s)  Dr. Florene Glen     Location  [facility]    Staff Present  Dorna Bloom, MS, ACSM RCEP, Exercise Physiologist;Tyara Carol Ada, MS,ACSM CEP, Exercise Physiologist;Olinty Celesta Aver, MS, ACSM CEP, Exercise Physiologist;Gleen Ripberger, RN, BSN    Medication changes reported      No    Fall or balance concerns reported     No    Tobacco Cessation  No Change    Warm-up and Cool-down  Performed as group-led instruction    Resistance Training Performed  Yes    VAD Patient?  No    PAD/SET Patient?  No      Pain Assessment   Currently in Pain?  No/denies    Multiple Pain Sites  No       Capillary Blood Glucose: No results found for this or any previous visit (from the past 24 hour(s)).    Social History   Tobacco Use  Smoking Status Former Smoker  . Packs/day: 0.00  . Years: 0.00  . Pack years: 0.00  Smokeless Tobacco Never Used    Goals Met:  Exercise tolerated well  Goals Unmet:  Not Applicable  Comments: Clair Gulling started cardiac rehab today.  Pt tolerated light exercise without difficulty. VSS, telemetry-Sinus Rhtyhm, asymptomatic.  Medication list reconciled. Pt denies barriers to medicaiton compliance.  PSYCHOSOCIAL ASSESSMENT:  PHQ-0. Pt exhibits positive coping skills, hopeful outlook with supportive family. No psychosocial needs identified at this time, no psychosocial interventions necessary.    Pt enjoys shooting pool and rock climbing.   Pt oriented to exercise equipment and routine.     Understanding verbalized. Barnet Pall, RN,BSN 03/18/2018 4:38 PM   Dr. Fransico Him is Medical Director for Cardiac Rehab at [facility].

## 2018-03-19 IMAGING — CT CT ANGIO HEAD
2 of 8 series · 7 of 35 positions shown · IV contrast (APPLIED)
Comparison: Brain MRI and intracranial MRA from yesterday

CLINICAL DATA: Stroke follow-up.

EXAM:
CT ANGIOGRAPHY HEAD AND NECK
TECHNIQUE: Multidetector CT imaging of the head and neck was performed using
the standard protocol during bolus administration of intravenous
contrast. Multiplanar CT image reconstructions and MIPs were
obtained to evaluate the vascular anatomy. Carotid stenosis
measurements (when applicable) are obtained utilizing NASCET
criteria, using the distal internal carotid diameter as the
denominator.
CONTRAST:  <See Chart> AEQW7K-LT1 IOPAMIDOL (AEQW7K-LT1) INJECTION
76%50 cc Isovue 370 intravenous

[Series 8: ax thins · axial · 0.39mm/px · z∈[-333,-48]mm · 6 of 402 slices shown]
[im 58/402  soft-tissue]
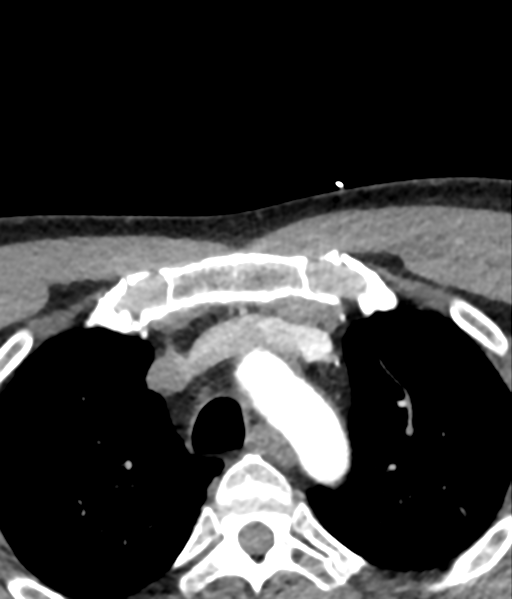
[im 115/402  bone]
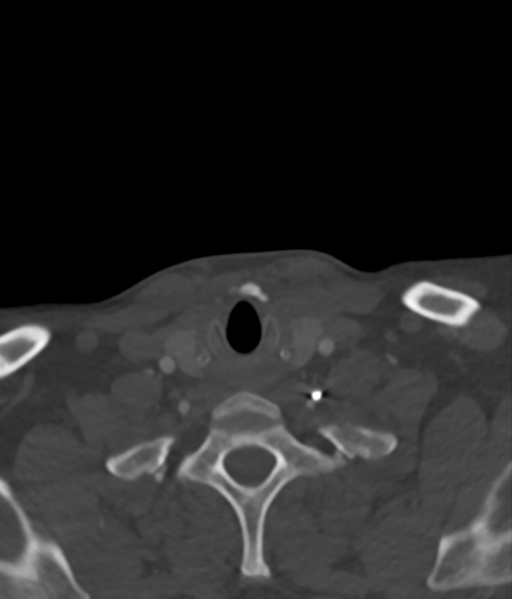
[im 172/402  soft-tissue]
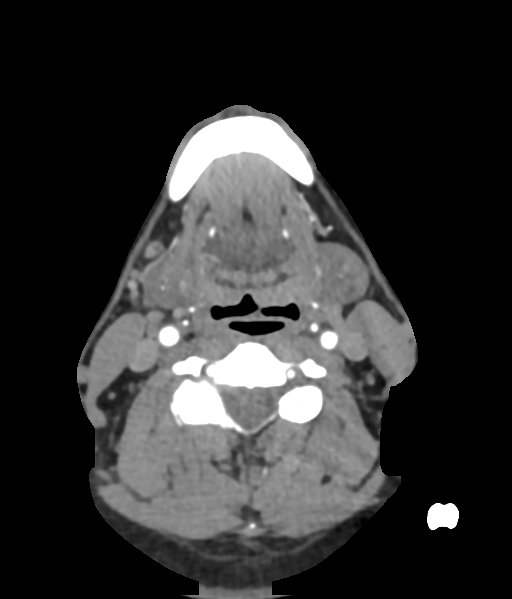
[im 230/402  bone]
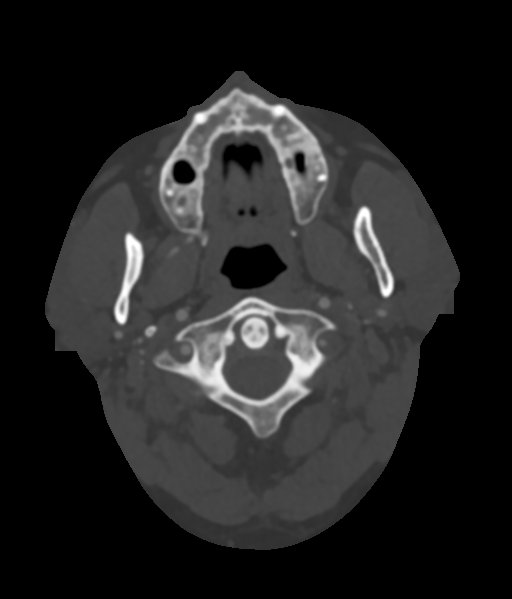
[im 287/402  soft-tissue]
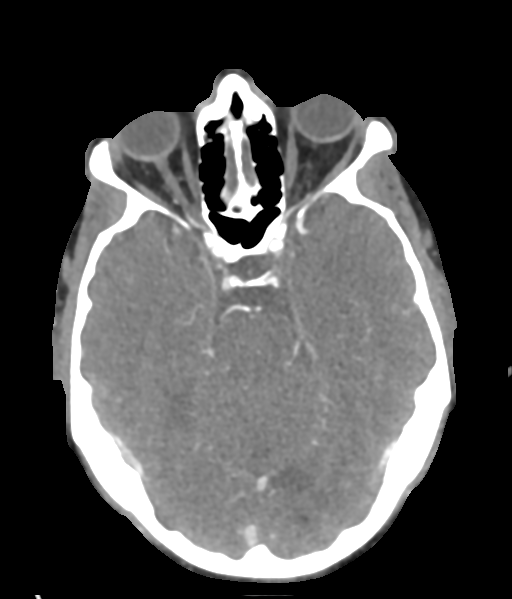
[im 344/402  bone]
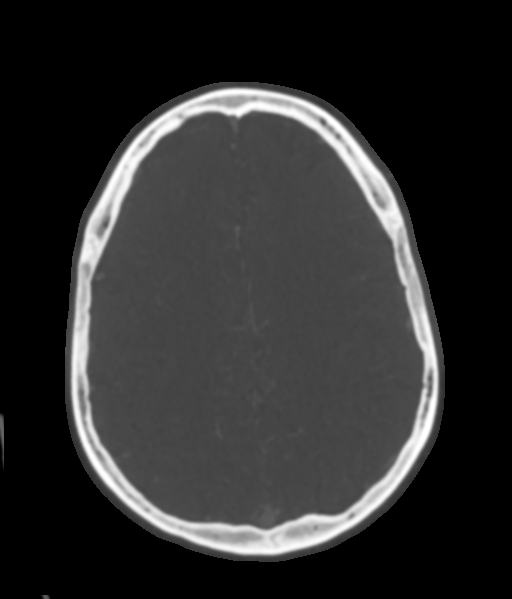

[Series 10: sag thins · sagittal · 0.59mm/px · 1 of 201 slices shown]
[im 65/201  soft-tissue]
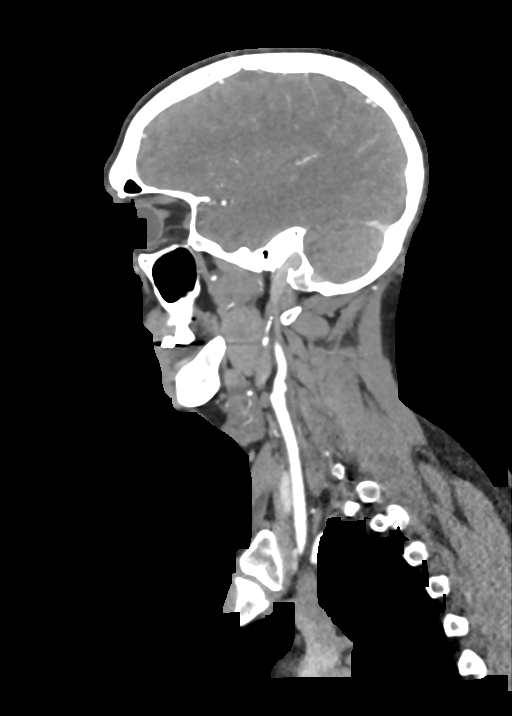

[7 of 35 positions shown; findings below may reference images not displayed]

FINDINGS: CTA NECK FINDINGS

Aortic arch: 3 vessel branching. Noncalcified atheromatous wall
thickening. No acute finding or dilatation.

Right carotid system: Questionable atheromatous wall thickening of
the common carotid artery and ICA bulb. No ulceration, dissection,
or beading.

Left carotid system: No stenosis, ulceration, or beading. No
definitive atherosclerosis.

Vertebral arteries: Proximal subclavian noncalcified atherosclerosis
with up to 40% stenosis. Right dominant vertebral artery. Limited
visualization of distal left V1 segment due to neighboring streak
artifact from intravenous contrast. No dissection or beading is
visualized.

Skeleton: No acute or aggressive finding

Other neck: No noted mass or inflammation.

Upper chest: Centrilobular emphysema.

Review of the MIP images confirms the above findings

CTA HEAD FINDINGS

Anterior circulation: Atheromatous plaque on the carotid siphons
that is mild. Mild narrowing at the left paraclinoid ICA segment. No
branch occlusion or flow limiting stenosis. Negative for aneurysm;
bulbous appearance of the left MCA bifurcation on thick MIPS is
related to trifurcation. Reference 3D reformats.

Posterior circulation: Dominant right vertebral artery. Most of left
vertebral flow is into the left PICA. Dominant rightAICA. No branch
occlusion. High-grade right P2 segment stenosis. Negative for
aneurysm.

Venous sinuses: Patent

Anatomic variants: None significant

Delayed phase: No abnormal intracranial enhancement. Known acute
right pontine infarct. Known bilateral occipital infarcts.

Review of the MIP images confirms the above findings
IMPRESSION: 1. No emergent vascular finding.
2. Prominent noncalcified atherosclerotic plaque in the left
subclavian artery with up to 40% stenosis. No superimposed
ulceration.
3. Intracranial atherosclerosis along the bilateral carotid siphons
with mild left paraclinoid ICA narrowing. Known high-grade right P2
segment stenosis.
4.  Emphysema ([id number]).

## 2018-03-19 NOTE — Progress Notes (Signed)
QUALITY OF LIFE SCORE REVIEW  Angel Davies completed Quality of Life survey as a participant in Cardiac Rehab. Scores 21. or below are considered low. Pt score very low in several areas Overall 15.56, Health and Function 15.67, socioeconomic 9.79, physiological and spiritual 15.93, family 22.80. Patient quality of life slightly altered by physical constraints which limits ability to perform as prior to recent cardiac illness. Angel Davies says he lost both of his dogs while he was in the hospital.  Offered emotional support and reassurance.Angel Davies denies being depressed currently.  Will continue to monitor and intervene as necessary. Angel Davies says he is interested in vocational rehab as he is currently unemployed. Patient was given a vocational rehab packet to review and return upon completion.Gladstone LighterMaria Livvy Spilman, RN,BSN 03/19/2018 11:33 AM

## 2018-03-20 ENCOUNTER — Encounter (HOSPITAL_COMMUNITY): Payer: Managed Care, Other (non HMO)

## 2018-03-20 ENCOUNTER — Ambulatory Visit (HOSPITAL_COMMUNITY)
Admission: RE | Admit: 2018-03-20 | Discharge: 2018-03-20 | Disposition: A | Discharge status: Home / Self Care | Payer: Managed Care, Other (non HMO) | Source: Ambulatory Visit | Attending: Internal Medicine | Admitting: Internal Medicine

## 2018-03-20 ENCOUNTER — Other Ambulatory Visit: Payer: Self-pay

## 2018-03-20 ENCOUNTER — Encounter (HOSPITAL_COMMUNITY): Payer: Self-pay | Admitting: Internal Medicine

## 2018-03-20 VITALS — BP 90/58 | HR 81 | Wt 189.4 lb

## 2018-03-20 DIAGNOSIS — F909 Attention-deficit hyperactivity disorder, unspecified type: Secondary | ICD-10-CM | POA: Diagnosis not present

## 2018-03-20 DIAGNOSIS — Z8673 Personal history of transient ischemic attack (TIA), and cerebral infarction without residual deficits: Secondary | ICD-10-CM | POA: Diagnosis not present

## 2018-03-20 DIAGNOSIS — Z720 Tobacco use: Secondary | ICD-10-CM

## 2018-03-20 DIAGNOSIS — Z79891 Long term (current) use of opiate analgesic: Secondary | ICD-10-CM | POA: Diagnosis not present

## 2018-03-20 DIAGNOSIS — I2510 Atherosclerotic heart disease of native coronary artery without angina pectoris: Secondary | ICD-10-CM | POA: Insufficient documentation

## 2018-03-20 DIAGNOSIS — Z7901 Long term (current) use of anticoagulants: Secondary | ICD-10-CM | POA: Diagnosis not present

## 2018-03-20 DIAGNOSIS — Z8042 Family history of malignant neoplasm of prostate: Secondary | ICD-10-CM | POA: Diagnosis not present

## 2018-03-20 DIAGNOSIS — F1410 Cocaine abuse, uncomplicated: Secondary | ICD-10-CM | POA: Diagnosis not present

## 2018-03-20 DIAGNOSIS — Z800 Family history of malignant neoplasm of digestive organs: Secondary | ICD-10-CM | POA: Insufficient documentation

## 2018-03-20 DIAGNOSIS — F1910 Other psychoactive substance abuse, uncomplicated: Secondary | ICD-10-CM

## 2018-03-20 DIAGNOSIS — Z79899 Other long term (current) drug therapy: Secondary | ICD-10-CM | POA: Insufficient documentation

## 2018-03-20 DIAGNOSIS — I480 Paroxysmal atrial fibrillation: Secondary | ICD-10-CM

## 2018-03-20 DIAGNOSIS — E782 Mixed hyperlipidemia: Secondary | ICD-10-CM

## 2018-03-20 DIAGNOSIS — Z09 Encounter for follow-up examination after completed treatment for conditions other than malignant neoplasm: Secondary | ICD-10-CM | POA: Diagnosis present

## 2018-03-20 DIAGNOSIS — Z823 Family history of stroke: Secondary | ICD-10-CM | POA: Diagnosis not present

## 2018-03-20 DIAGNOSIS — I5022 Chronic systolic (congestive) heart failure: Secondary | ICD-10-CM

## 2018-03-20 DIAGNOSIS — Z87891 Personal history of nicotine dependence: Secondary | ICD-10-CM | POA: Insufficient documentation

## 2018-03-20 DIAGNOSIS — E785 Hyperlipidemia, unspecified: Secondary | ICD-10-CM | POA: Insufficient documentation

## 2018-03-20 DIAGNOSIS — F419 Anxiety disorder, unspecified: Secondary | ICD-10-CM | POA: Insufficient documentation

## 2018-03-20 DIAGNOSIS — I110 Hypertensive heart disease with heart failure: Secondary | ICD-10-CM | POA: Diagnosis not present

## 2018-03-20 DIAGNOSIS — F329 Major depressive disorder, single episode, unspecified: Secondary | ICD-10-CM | POA: Insufficient documentation

## 2018-03-20 DIAGNOSIS — F1010 Alcohol abuse, uncomplicated: Secondary | ICD-10-CM | POA: Diagnosis not present

## 2018-03-20 NOTE — Progress Notes (Signed)
Advanced Heart Failure Clinic Note   Primary Care: Dr. Via Primary Cardiologist: Dr. Gala RomneyBensimhon  HPI:  Angel Simmondslaretha CooperJames D Davies is a 47 y.o. male with PMH of HTN, ADHD, Obesity, anxiety, substance abuse, and depression.   Angel Davies presented 07/02/2017 with CVA. UDS + for cocaine. Found to have new systolic CHF by echo.   Echo 07/02/17 with LVEF 20-25%, grade 2 DD, Trivial MR, Trivial TR -> HF team consulted.  With new CHF, Angel Davies underwent R/LHC as below that showed severe 3vD with no options for CABG/CAD. HF meds titrated as tolerated.  Admitted to CIR from 12/7 - 07/27/17 with marked improvement. Med titration somewhat limited due to soft pressures.   He presents today for regular follow up. Feeling good overall. Now doing cardiac rehab. Feels like he is getting stronger. Denies DOE or CP. Denies lightheadedness or dizziness. No palpitations, syncope or presyncope. No edema.  Having occasional glass of red wine 2-3 times a week. No peripheral edema. Not on any lasix. No bleeding on eliquis. Still has very mild deficit in his left arm, mostly just numb and slightly slower to respond than R arm.   Echo 12/17/17 EF 30-35% No apical thrombus.   Review of systems complete and found to be negative unless listed in HPI.    cMRI on 07/06/17 with 1) Severe LVE with EF 18% septal and apical akinesis diffuse hypokinesis 2) Apical thrombus measuring 11 mm x 8 mm 3) Full thickness scar involving the mid and distal septum and apex Subendocardial scar involving the basal inferior lateral wall  R/LHC 07/05/17  Mid RCA to Dist RCA lesion is 100% stenosed.  Dist Cx lesion is 100% stenosed.  Mid LAD lesion is 100% stenosed.  Prox Cx to Dist Cx lesion is 30% stenosed.  Findings:  Ao = 104/68 (84) LV = 110/9 RA = 1 RV = 19/3 PA = 19/9 (11) PCW = 5 Fick cardiac output/index = 4.3/2.1 PVR = 1.5 WU SVR = 2122 Ao sat = 96% PA sat = 64%, 66%  Assessment: 1. Severe CAD with left-dominant system and occluded  mLAD 2. Ischemic CM with EF 25% by echo 3. Well compensated filling pressures with moderately reduced cardiac output  Past Medical History:  Diagnosis Date  . ADHD (attention deficit hyperactivity disorder)   . Anxiety   . CVA (cerebral vascular accident) (HCC) 07/02/2017   "left sided weakness" (07/02/2017)  . Depression   . History of kidney stones    Current Outpatient Medications  Medication Sig Dispense Refill  . amphetamine-dextroamphetamine (ADDERALL) 20 MG tablet Take 20 mg by mouth 3 (three) times daily.    Marland Kitchen. apixaban (ELIQUIS) 5 MG TABS tablet Take 5 mg by mouth 2 (two) times daily.    Marland Kitchen. atorvastatin (LIPITOR) 80 MG tablet Take 1 tablet (80 mg total) by mouth daily at 6 PM. 30 tablet 5  . carvedilol (COREG) 6.25 MG tablet Take 1 tablet (6.25 mg total) by mouth 2 (two) times daily with a meal. 60 tablet 5  . fenofibrate 160 MG tablet Take 1 tablet (160 mg total) by mouth daily. 30 tablet 5  . folic acid (FOLVITE) 1 MG tablet Take 1 tablet (1 mg total) by mouth daily. 30 tablet 0  . Multiple Vitamin (MULTIVITAMIN WITH MINERALS) TABS tablet Take 1 tablet by mouth daily. 100 tablet 0  . nicotine (NICODERM CQ - DOSED IN MG/24 HOURS) 14 mg/24hr patch Place 1 patch (14 mg total) onto the skin daily. 28 patch 0  .  sacubitril-valsartan (ENTRESTO) 97-103 MG Take 1 tablet by mouth 2 (two) times daily. 60 tablet 6  . spironolactone (ALDACTONE) 25 MG tablet Take 0.5 tablets (12.5 mg total) by mouth at bedtime. 30 tablet 5  . thiamine 100 MG tablet Take 1 tablet (100 mg total) by mouth daily. 30 tablet 0  . traMADol (ULTRAM) 50 MG tablet Take 50 mg by mouth as needed for moderate pain (Every 4-6 hours PRN).    Marland Kitchen. traZODone (DESYREL) 50 MG tablet Take 100-150 mg by mouth at bedtime as needed for sleep.     No current facility-administered medications for this encounter.    No Known Allergies  Social History   Socioeconomic History  . Marital status: Married    Spouse name: Not on file    . Number of children: Not on file  . Years of education: Not on file  . Highest education level: Not on file  Occupational History  . Not on file  Social Needs  . Financial resource strain: Not on file  . Food insecurity:    Worry: Not on file    Inability: Not on file  . Transportation needs:    Medical: Not on file    Non-medical: Not on file  Tobacco Use  . Smoking status: Former Smoker    Packs/day: 0.00    Years: 0.00    Pack years: 0.00  . Smokeless tobacco: Never Used  Substance and Sexual Activity  . Alcohol use: Yes    Alcohol/week: 4.0 standard drinks    Types: 4 Glasses of wine per week  . Drug use: Yes    Types: Marijuana, "Crack" cocaine    Comment: 07/02/2017 "smokes marijuana a couple days/week" , "08/20/17 vapes marijuana juice"  . Sexual activity: Yes  Lifestyle  . Physical activity:    Days per week: Not on file    Minutes per session: Not on file  . Stress: Not on file  Relationships  . Social connections:    Talks on phone: Not on file    Gets together: Not on file    Attends religious service: Not on file    Active member of club or organization: Not on file    Attends meetings of clubs or organizations: Not on file    Relationship status: Not on file  . Intimate partner violence:    Fear of current or ex partner: Not on file    Emotionally abused: Not on file    Physically abused: Not on file    Forced sexual activity: Not on file  Other Topics Concern  . Not on file  Social History Narrative  . Not on file   Family History  Problem Relation Age of Onset  . Stroke Mother   . Colon cancer Mother   . Prostate cancer Father   . Seizures Neg Hx    Vitals:   03/20/18 1010  BP: (!) 90/58  Pulse: 81  SpO2: 99%  Weight: 85.9 kg (189 lb 6 oz)   Wt Readings from Last 3 Encounters:  03/20/18 85.9 kg (189 lb 6 oz)  03/12/18 86.9 kg (191 lb 9.3 oz)  01/14/18 89.4 kg (197 lb)   PHYSICAL EXAM: General:  Well appearing. No resp  difficulty HEENT: normal Neck: supple. no JVD. Carotids 2+ bilat; no bruits. No lymphadenopathy or thryomegaly appreciated. Cor: PMI nondisplaced. Regular rate & rhythm. No rubs, gallops or murmurs. Lungs: clear Abdomen: soft, nontender, nondistended. No hepatosplenomegaly. No bruits or masses. Good bowel sounds.  Extremities: no cyanosis, clubbing, rash, edema Neuro: alert & orientedx3, cranial nerves grossly intact. moves all 4 extremities w/o difficulty. Affect pleasant  ASSESSMENT & PLAN:  1. Chronic systolic CHF, ICM - Echo 07/02/17 with LVEF 20-25%, grade 2 DD, Trivial MR, Trivial TR - cMRI EF 18% - EF 12/17/17 ~35% range.  - NYHA I symptoms - Volume status stable on exam.   - Continue spiro 12.5 mg daily.  - Continue Entresto 97/103 mg BID.  - Continue coreg 6.25 mg BID.  - Continue spiro 12.5 daily - Discussed ICD but will hold off for now as EF getting better and NYHA I. 3. CAD - LHC 07/05/17 with severe CAD with left-dominant system and occluded mLAD. - Mostly distal CAD. No good targets for CABG or PCI. Continue medical therapy. Based on scar pattern on MRI, We are hopeful EF will improve at least moderately   - Continue statin. No ASA with coumadin - No s/s of ischemia.    3. CVA - MRI Head 07/02/17 R pontine infarct and sub acute R temporal occipital/PCA territory infarct, along with old left occipital lobe/PCA territory infarct. - No bleeding eliquis.  - If cardiac apex improves can consider switching to ASA/Plavix - Minimal deficits. No change.  4. Cocaine abuse - Remains abstinent 5. Alcohol abuse - Encouraged responsible moderation.  6. HLD - Continue atorvastatin 80 mg daily.  - Per PCP.   Graciella Freer, PA-C 03/20/18 10:39 AM  Patient seen and examined with the above-signed Advanced Practice Provider and/or Housestaff. I personally reviewed laboratory data, imaging studies and relevant notes. I independently examined the patient and formulated the  important aspects of the plan. I have edited the note to reflect any of my changes or salient points. I have personally discussed the plan with the patient and/or family.  Doing well. NYHA I. On good meds. BP soft. No dizziness. Will repeat echo in 3 months and decide on ICD.   Arvilla Meres, MD  10:52 AM

## 2018-03-20 NOTE — Patient Instructions (Signed)
Your physician recommends that you schedule a follow-up appointment in: 3 months.  

## 2018-03-22 ENCOUNTER — Encounter (HOSPITAL_COMMUNITY)
Admission: RE | Admit: 2018-03-22 | Discharge: 2018-03-22 | Disposition: A | Discharge status: Home / Self Care | Payer: Managed Care, Other (non HMO) | Source: Ambulatory Visit | Attending: Internal Medicine | Admitting: Internal Medicine

## 2018-03-22 ENCOUNTER — Encounter (HOSPITAL_COMMUNITY): Payer: Managed Care, Other (non HMO)

## 2018-03-22 DIAGNOSIS — I5022 Chronic systolic (congestive) heart failure: Secondary | ICD-10-CM | POA: Diagnosis not present

## 2018-03-25 ENCOUNTER — Encounter (HOSPITAL_COMMUNITY)
Admission: RE | Admit: 2018-03-25 | Discharge: 2018-03-25 | Disposition: A | Discharge status: Home / Self Care | Payer: Managed Care, Other (non HMO) | Source: Ambulatory Visit | Attending: Internal Medicine | Admitting: Internal Medicine

## 2018-03-25 ENCOUNTER — Encounter (HOSPITAL_COMMUNITY): Payer: Managed Care, Other (non HMO)

## 2018-03-25 DIAGNOSIS — I5022 Chronic systolic (congestive) heart failure: Secondary | ICD-10-CM

## 2018-03-25 NOTE — Progress Notes (Signed)
Reviewed home exercise guidelines with patient including endpoints, temperature precautions, target heart rate and rate of perceived exertion. Pt plans to to [facility] to [facility], but in the meantime, he's walking and using balance board as his mode of home exercise. Pt voices understanding of instructions given. Artist Paislinty M Nil Xiong, MS, ACSM CEP

## 2018-03-27 ENCOUNTER — Encounter (HOSPITAL_COMMUNITY): Payer: Managed Care, Other (non HMO)

## 2018-03-27 ENCOUNTER — Encounter (HOSPITAL_COMMUNITY)
Admission: RE | Admit: 2018-03-27 | Discharge: 2018-03-27 | Disposition: A | Discharge status: Home / Self Care | Payer: Managed Care, Other (non HMO) | Source: Ambulatory Visit | Attending: Internal Medicine | Admitting: Internal Medicine

## 2018-03-27 DIAGNOSIS — I5022 Chronic systolic (congestive) heart failure: Secondary | ICD-10-CM

## 2018-03-29 ENCOUNTER — Encounter (HOSPITAL_COMMUNITY)
Admission: RE | Admit: 2018-03-29 | Discharge: 2018-03-29 | Disposition: A | Discharge status: Home / Self Care | Payer: Managed Care, Other (non HMO) | Source: Ambulatory Visit | Attending: Internal Medicine | Admitting: Internal Medicine

## 2018-03-29 ENCOUNTER — Encounter (HOSPITAL_COMMUNITY): Payer: Managed Care, Other (non HMO)

## 2018-03-29 DIAGNOSIS — I5022 Chronic systolic (congestive) heart failure: Secondary | ICD-10-CM

## 2018-04-03 ENCOUNTER — Encounter (HOSPITAL_COMMUNITY)
Admission: RE | Admit: 2018-04-03 | Discharge: 2018-04-03 | Disposition: A | Discharge status: Home / Self Care | Payer: Managed Care, Other (non HMO) | Source: Ambulatory Visit | Attending: Internal Medicine | Admitting: Internal Medicine

## 2018-04-03 ENCOUNTER — Encounter (HOSPITAL_COMMUNITY): Payer: Managed Care, Other (non HMO)

## 2018-04-03 DIAGNOSIS — Z79899 Other long term (current) drug therapy: Secondary | ICD-10-CM | POA: Diagnosis not present

## 2018-04-03 DIAGNOSIS — F329 Major depressive disorder, single episode, unspecified: Secondary | ICD-10-CM | POA: Diagnosis not present

## 2018-04-03 DIAGNOSIS — F909 Attention-deficit hyperactivity disorder, unspecified type: Secondary | ICD-10-CM | POA: Diagnosis not present

## 2018-04-03 DIAGNOSIS — Z87442 Personal history of urinary calculi: Secondary | ICD-10-CM | POA: Insufficient documentation

## 2018-04-03 DIAGNOSIS — Z7901 Long term (current) use of anticoagulants: Secondary | ICD-10-CM | POA: Insufficient documentation

## 2018-04-03 DIAGNOSIS — Z87891 Personal history of nicotine dependence: Secondary | ICD-10-CM | POA: Diagnosis not present

## 2018-04-03 DIAGNOSIS — F419 Anxiety disorder, unspecified: Secondary | ICD-10-CM | POA: Diagnosis not present

## 2018-04-03 DIAGNOSIS — I5022 Chronic systolic (congestive) heart failure: Secondary | ICD-10-CM | POA: Diagnosis present

## 2018-04-03 DIAGNOSIS — Z8673 Personal history of transient ischemic attack (TIA), and cerebral infarction without residual deficits: Secondary | ICD-10-CM | POA: Insufficient documentation

## 2018-04-04 NOTE — Progress Notes (Signed)
Cardiac Individual Treatment Plan  Patient Details  Name: Angel Davies MRN: [id number] Date of Birth: 1971-06-09 Referring Provider:     CARDIAC REHAB PHASE II ORIENTATION from 03/12/2018 in [facility] CARDIAC [facility]  Referring Provider  Lauro Franklin MD      Initial Encounter Date:    CARDIAC REHAB PHASE II ORIENTATION from 03/12/2018 in [facility] CARDIAC REHAB  Date  03/12/18      Visit Diagnosis: Heart failure, chronic systolic (HCC)  Patient's Home Medications on Admission:  Current Outpatient Medications:  .  amphetamine-dextroamphetamine (ADDERALL) 20 MG tablet, Take 20 mg by mouth 3 (three) times daily., Disp: , Rfl:  .  apixaban (ELIQUIS) 5 MG TABS tablet, Take 5 mg by mouth 2 (two) times daily., Disp: , Rfl:  .  atorvastatin (LIPITOR) 80 MG tablet, Take 1 tablet (80 mg total) by mouth daily at 6 PM., Disp: 30 tablet, Rfl: 5 .  carvedilol (COREG) 6.25 MG tablet, Take 1 tablet (6.25 mg total) by mouth 2 (two) times daily with a meal., Disp: 60 tablet, Rfl: 5 .  fenofibrate 160 MG tablet, Take 1 tablet (160 mg total) by mouth daily., Disp: 30 tablet, Rfl: 5 .  folic acid (FOLVITE) 1 MG tablet, Take 1 tablet (1 mg total) by mouth daily., Disp: 30 tablet, Rfl: 0 .  Multiple Vitamin (MULTIVITAMIN WITH MINERALS) TABS tablet, Take 1 tablet by mouth daily., Disp: 100 tablet, Rfl: 0 .  nicotine (NICODERM CQ - DOSED IN MG/24 HOURS) 14 mg/24hr patch, Place 1 patch (14 mg total) onto the skin daily., Disp: 28 patch, Rfl: 0 .  sacubitril-valsartan (ENTRESTO) 97-103 MG, Take 1 tablet by mouth 2 (two) times daily., Disp: 60 tablet, Rfl: 6 .  spironolactone (ALDACTONE) 25 MG tablet, Take 0.5 tablets (12.5 mg total) by mouth at bedtime., Disp: 30 tablet, Rfl: 5 .  thiamine 100 MG tablet, Take 1 tablet (100 mg total) by mouth daily., Disp: 30 tablet, Rfl: 0 .  traMADol (ULTRAM) 50 MG tablet, Take 50 mg by mouth as needed for moderate pain (Every 4-6 hours  PRN)., Disp: , Rfl:  .  traZODone (DESYREL) 50 MG tablet, Take 100-150 mg by mouth at bedtime as needed for sleep., Disp: , Rfl:   Past Medical History: Past Medical History:  Diagnosis Date  . ADHD (attention deficit hyperactivity disorder)   . Anxiety   . CVA (cerebral vascular accident) (HCC) 07/02/2017   "left sided weakness" (07/02/2017)  . Depression   . History of kidney stones     Tobacco Use: Social History   Tobacco Use  Smoking Status Former Smoker  . Packs/day: 0.00  . Years: 0.00  . Pack years: 0.00  Smokeless Tobacco Never Used    Labs: Recent Review Flowsheet Data    Labs for ITP Cardiac and Pulmonary Rehab Latest Ref Rng & Units 07/02/2017 07/03/2017 07/05/2017 07/05/2017 07/05/2017   Cholestrol 0 - 200 mg/dL - 245(Y) - - -   LDLCALC 0 - 99 mg/dL - UNABLE TO CALCULATE IF TRIGLYCERIDE OVER 400 mg/dL - - -   HDL >09 mg/dL - 98(P) - - -   Trlycerides <150 mg/dL - 382(N) - - -   Hemoglobin A1c 4.8 - 5.6 % - 6.0(H) - - -   PHART 7.350 - 7.450 - - 7.408 - -   PCO2ART 32.0 - 48.0 mmHg - - 32.9 - -   HCO3 20.0 - 28.0 mmol/L - - 20.7 24.2 24.3   TCO2  22 - 32 mmol/L 21(L) - 22 25 26    ACIDBASEDEF 0.0 - 2.0 mmol/L - - 3.0(H) 1.0 1.0   O2SAT % - - 95.0 66.0 67.0      Capillary Blood Glucose: Lab Results  Component Value Date   GLUCAP 115 (H) 07/05/2017   GLUCAP 113 (H) 07/04/2017   GLUCAP 147 (H) 07/02/2017     Exercise Target Goals: Exercise Program Goal: Individual exercise prescription set using results from initial 6 min walk test and THRR while considering  patient's activity barriers and safety.   Exercise Prescription Goal: Initial exercise prescription builds to 30-45 minutes a day of aerobic activity, 2-3 days per week.  Home exercise guidelines will be given to patient during program as part of exercise prescription that the participant will acknowledge.  Activity Barriers & Risk Stratification: Activity Barriers & Cardiac Risk Stratification -  03/12/18 1152      Activity Barriers & Cardiac Risk Stratification   Activity Barriers  Back Problems;Deconditioning;Muscular Weakness;Other (comment)    Comments  stroke (L side compromise)    Cardiac Risk Stratification  High       6 Minute Walk: 6 Minute Walk    Row Name 03/12/18 1149         6 Minute Walk   Phase  Initial     Distance  1426 feet     Walk Time  5.34 minutes     # of Rest Breaks  0     MPH  3     METS  4.1     RPE  13     VO2 Peak  14.5     Symptoms  Yes (comment)     Comments  fatigue! Stopped test at 5:34; c/o B shin discomfort/ resolved with rest     Resting HR  81 bpm     Resting BP  100/60     Resting Oxygen Saturation   97 %     Exercise Oxygen Saturation  during 6 min walk  98 %     Max Ex. HR  101 bpm     Max Ex. BP  108/70     2 Minute Post BP  97/69        Oxygen Initial Assessment:   Oxygen Re-Evaluation:   Oxygen Discharge (Final Oxygen Re-Evaluation):   Initial Exercise Prescription: Initial Exercise Prescription - 03/12/18 1100      Date of Initial Exercise RX and Referring Provider   Date  03/12/18    Referring Provider  Lauro Franklin MD      Recumbant Bike   Level  2    Minutes  10    METs  2      NuStep   Level  3    SPM  80    Minutes  10    METs  2      Track   Laps  10    Minutes  10    METs  2.74      Prescription Details   Frequency (times per week)  3    Duration  Progress to 30 minutes of continuous aerobic without signs/symptoms of physical distress      Intensity   THRR 40-80% of Max Heartrate  69-138    Ratings of Perceived Exertion  11-13    Perceived Dyspnea  0-4      Progression   Progression  Continue to progress workloads to maintain intensity without signs/symptoms of physical distress.  Resistance Training   Training Prescription  Yes    Weight  4lbs    Reps  10-15       Perform Capillary Blood Glucose checks as needed.  Exercise Prescription Changes: Exercise  Prescription Changes    Row Name 03/18/18 0955 03/25/18 0949           Response to Exercise   Blood Pressure (Admit)  118/72  120/78      Blood Pressure (Exercise)  130/80  130/70      Blood Pressure (Exit)  107/73  100/72      Heart Rate (Admit)  99 bpm  87 bpm      Heart Rate (Exercise)  126 bpm  110 bpm      Heart Rate (Exit)  95 bpm  84 bpm      Rating of Perceived Exertion (Exercise)  12  11      Symptoms  none  none      Duration  Progress to 30 minutes of  aerobic without signs/symptoms of physical distress  Progress to 30 minutes of  aerobic without signs/symptoms of physical distress      Intensity  THRR unchanged  THRR unchanged        Progression   Progression  Continue to progress workloads to maintain intensity without signs/symptoms of physical distress.  Continue to progress workloads to maintain intensity without signs/symptoms of physical distress.      Average METs  3.6  3.5        Resistance Training   Training Prescription  Yes  Yes      Weight  4lbs  5lbs      Reps  10-15  10-15      Time  10 Minutes  10 Minutes        Interval Training   Interval Training  No  No        Recumbant Bike   Level  2  3 Upright Scifit Bike      Minutes  10  10      METs  5.2  4.3        NuStep   Level  3  5      SPM  80  85      Minutes  10  10      METs  2.6  3.2        Track   Laps  11  11      Minutes  10  10      METs  2.91  2.91        Home Exercise Plan   Plans to continue exercise at  -  Home (comment) [facility]      Frequency  -  Add 2 additional days to program exercise sessions.      Initial Home Exercises Provided  -  03/25/18         Exercise Comments: Exercise Comments    Row Name 03/18/18 0955 03/25/18 1106         Exercise Comments  Patient tolerated exercise well without c/o.  Reviewed home exercise guidelines, METs, and goals with patient.         Exercise Goals and Review: Exercise Goals    Row  Name 03/12/18 1155             Exercise Goals   Increase Physical Activity  Yes       Intervention  Provide  advice, education, support and counseling about physical activity/exercise needs.;Develop an individualized exercise prescription for aerobic and resistive training based on initial evaluation findings, risk stratification, comorbidities and participant's personal goals.       Expected Outcomes  Short Term: Attend rehab on a regular basis to increase amount of physical activity.;Long Term: Add in home exercise to make exercise part of routine and to increase amount of physical activity.;Long Term: Exercising regularly at least 3-5 days a week.       Increase Strength and Stamina  Yes       Intervention  Provide advice, education, support and counseling about physical activity/exercise needs.;Develop an individualized exercise prescription for aerobic and resistive training based on initial evaluation findings, risk stratification, comorbidities and participant's personal goals.       Expected Outcomes  Short Term: Increase workloads from initial exercise prescription for resistance, speed, and METs.;Long Term: Improve cardiorespiratory fitness, muscular endurance and strength as measured by increased METs and functional capacity ( );Short Term: Perform resistance training exercises routinely during rehab and add in resistance training at home       Able to understand and use rate of perceived exertion (RPE) scale  Yes       Intervention  Provide education and explanation on how to use RPE scale       Expected Outcomes  Short Term: Able to use RPE daily in rehab to express subjective intensity level;Long Term:  Able to use RPE to guide intensity level when exercising independently       Knowledge and understanding of Target Heart Rate Range (THRR)  Yes       Intervention  Provide education and explanation of THRR including how the numbers were predicted and where they are located for  reference       Expected Outcomes  Short Term: Able to state/look up THRR;Short Term: Able to use daily as guideline for intensity in rehab;Long Term: Able to use THRR to govern intensity when exercising independently       Able to check pulse independently  Yes       Intervention  Review the importance of being able to check your own pulse for safety during independent exercise;Provide education and demonstration on how to check pulse in carotid and radial arteries.       Expected Outcomes  Short Term: Able to explain why pulse checking is important during independent exercise;Long Term: Able to check pulse independently and accurately       Understanding of Exercise Prescription  Yes       Intervention  Provide education, explanation, and written materials on patient's individual exercise prescription       Expected Outcomes  Short Term: Able to explain program exercise prescription;Long Term: Able to explain home exercise prescription to exercise independently          Exercise Goals Re-Evaluation : Exercise Goals Re-Evaluation    Row Name 03/18/18 0955 03/25/18 1106           Exercise Goal Re-Evaluation   Exercise Goals Review  Able to understand and use rate of perceived exertion (RPE) scale  Able to understand and use rate of perceived exertion (RPE) scale;Understanding of Exercise Prescription;Knowledge and understanding of Target Heart Rate Range (THRR)      Comments  Patient able to understand and use RPE scale appropriately.  Reviewed home exercise guidelines with patient including THRR, RPE scale, and endpoints for exercise. [facility] and [facility] Pt is also walking and using a balance board as his mode of aerobic exercise. Pt will [facility] before resuming [facility].      Expected Outcomes  Increase workloads as tolerated to help improve cardiorespiratory fitness and improve  stamina.  Patient will exercise 30 minutes at least 2 days in addition to exercise at [facility] to help improve cardiorespiratory fitness and increase strength and stamina.         Discharge Exercise Prescription (Final Exercise Prescription Changes): Exercise Prescription Changes - 03/25/18 0949      Response to Exercise   Blood Pressure (Admit)  120/78    Blood Pressure (Exercise)  130/70    Blood Pressure (Exit)  100/72    Heart Rate (Admit)  87 bpm    Heart Rate (Exercise)  110 bpm    Heart Rate (Exit)  84 bpm    Rating of Perceived Exertion (Exercise)  11    Symptoms  none    Duration  Progress to 30 minutes of  aerobic without signs/symptoms of physical distress    Intensity  THRR unchanged      Progression   Progression  Continue to progress workloads to maintain intensity without signs/symptoms of physical distress.    Average METs  3.5      Resistance Training   Training Prescription  Yes    Weight  5lbs    Reps  10-15    Time  10 Minutes      Interval Training   Interval Training  No      Recumbant Bike   Level  3   Upright Scifit Bike   Minutes  10    METs  4.3      NuStep   Level  5    SPM  85    Minutes  10    METs  3.2      Track   Laps  11    Minutes  10    METs  2.91      Home Exercise Plan   Plans to continue exercise at  Home (comment)   [facility]   Frequency  Add 2 additional days to program exercise sessions.    Initial Home Exercises Provided  03/25/18       Nutrition:  Target Goals: Understanding of nutrition guidelines, daily intake of sodium 1500mg , cholesterol 200mg , calories 30% from fat and 7% or less from saturated fats, daily to have 5 or more servings of fruits and vegetables.  Biometrics: Pre Biometrics - 03/12/18 1155      Pre Biometrics   Height  5\' 9"  (1.753 m)    Weight  86.9 kg    Waist Circumference  38.5 inches    Hip Circumference  38.5 inches    Waist to Hip Ratio  1 %    BMI  (Calculated)  28.28    Triceps Skinfold  20 mm    % Body Fat  27.5 %    Grip Strength  47 kg    Flexibility  12 in    Single Leg Stand  30 seconds        Nutrition Therapy Plan and Nutrition Goals:   Nutrition Assessments:   Nutrition Goals Re-Evaluation:   Nutrition Goals Re-Evaluation:   Nutrition Goals Discharge (Final Nutrition Goals Re-Evaluation):   Psychosocial: Target Goals: Acknowledge presence or absence of significant depression  and/or stress, maximize coping skills, provide positive support system. Participant is able to verbalize types and ability to use techniques and skills needed for reducing stress and depression.  Initial Review & Psychosocial Screening: Initial Psych Review & Screening - 03/12/18 1508      Initial Review   Current issues with  (S) Current Anxiety/Panic   health related anxiety     Family Dynamics   Good Support System?  Yes   spouse     Barriers   Psychosocial barriers to participate in program  The patient should benefit from training in stress management and relaxation.      Screening Interventions   Interventions  Encouraged to exercise;Program counselor consult;To provide support and resources with identified psychosocial needs;Provide feedback about the scores to participant    Expected Outcomes  Short Term goal: Utilizing psychosocial counselor, staff and physician to assist with identification of specific Stressors or current issues interfering with healing process. Setting desired goal for each stressor or current issue identified.;Short Term goal: Identification and review with participant of any Quality of Life or Depression concerns found by scoring the questionnaire.       Quality of Life Scores: Quality of Life - 03/12/18 1256      Quality of Life   Select  Quality of Life      Quality of Life Scores   Health/Function Pre  15.67 %    Socioeconomic Pre  9.79 %    Psych/Spiritual Pre  15.93 %    Family Pre  22.8  %    GLOBAL Pre  15.56 %      Scores of 19 and below usually indicate a poorer quality of life in these areas.  A difference of  2-3 points is a clinically meaningful difference.  A difference of 2-3 points in the total score of the Quality of Life Index has been associated with significant improvement in overall quality of life, self-image, physical symptoms, and general health in studies assessing change in quality of life.  PHQ-9: Recent Review Flowsheet Data    Depression screen [facility] 2/9 03/18/2018 08/13/2017   Decreased Interest 0 0   Down, Depressed, Hopeless 0 0   PHQ - 2 Score 0 0     Interpretation of Total Score  Total Score Depression Severity:  1-4 = Minimal depression, 5-9 = Mild depression, 10-14 = Moderate depression, 15-19 = Moderately severe depression, 20-27 = Severe depression   Psychosocial Evaluation and Intervention:   Psychosocial Re-Evaluation: Psychosocial Re-Evaluation    Row Name 04/04/18 1645             Psychosocial Re-Evaluation   Current issues with  Current Anxiety/Panic       Comments  [name] has not voiced any increased stressors so far       Expected Outcomes  Will assess and offer support as needed       Interventions  Stress management education;Encouraged to attend Cardiac Rehabilitation for the exercise       Continue Psychosocial Services   Follow up required by staff          Psychosocial Discharge (Final Psychosocial Re-Evaluation): Psychosocial Re-Evaluation - 04/04/18 1645      Psychosocial Re-Evaluation   Current issues with  Current Anxiety/Panic    Comments  [name] has not voiced any increased stressors so far    Expected Outcomes  Will assess and offer support as needed    Interventions  Stress management education;Encouraged to attend Cardiac Rehabilitation for the  exercise    Continue Psychosocial Services   Follow up required by staff       Vocational Rehabilitation: Provide vocational rehab assistance to qualifying  candidates.   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 Participant will state understanding/return demonstration of topics presented.  Learning Barriers/Preferences: Learning Barriers/Preferences - 03/12/18 1250      Learning Barriers/Preferences   Learning Barriers  Exercise Concerns   recovering from stroke, L-sided weakness   Learning Preferences  Skilled Demonstration       Education Topics: Count Your Pulse:  -Group instruction provided by verbal instruction, demonstration, patient participation and written materials to support subject.  Instructors address importance of being able to find your pulse and how to count your pulse when at home without a heart monitor.  Patients get hands on experience counting their pulse with staff help and individually.   Heart Attack, Angina, and Risk Factor Modification:  -Group instruction provided by verbal instruction, video, and written materials to support subject.  Instructors address signs and symptoms of angina and heart attacks.    Also discuss risk factors for heart disease and how to make changes to improve heart health risk factors.   Functional Fitness:  -Group instruction provided by verbal instruction, demonstration, patient participation, and written materials to support subject.  Instructors address safety measures for doing things around the house.  Discuss how to get up and down off the floor, how to pick things up properly, how to safely get out of a chair without assistance, and balance training.   Meditation and Mindfulness:  -Group instruction provided by verbal instruction, patient participation, and  written materials to support subject.  Instructor addresses importance of mindfulness and meditation practice to help reduce stress and improve awareness.  Instructor also leads participants through a meditation exercise.    Stretching for Flexibility and Mobility:  -Group instruction provided by verbal instruction, patient participation, and written materials to support subject.  Instructors lead participants through series of stretches that are designed to increase flexibility thus improving mobility.  These stretches are additional exercise for major muscle groups that are typically performed during regular warm up and cool down.   Hands Only CPR:  -Group verbal, video, and participation provides a basic overview of AHA guidelines for community CPR. Role-play of emergencies allow participants the opportunity to practice calling for help and chest compression technique with discussion of AED use.   Hypertension: -Group verbal and written instruction that provides a basic overview of hypertension including the most recent diagnostic guidelines, risk factor reduction with self-care instructions and medication management.    Nutrition I class: Heart Healthy Eating:  -Group instruction provided by PowerPoint slides, verbal discussion, and written materials to support subject matter. The instructor gives an explanation and review of the Therapeutic Lifestyle Changes diet recommendations, which includes a discussion on lipid goals, dietary fat, sodium, fiber, plant stanol/sterol esters, sugar, and the components of a well-balanced, healthy diet.   Nutrition II class: Lifestyle Skills:  -Group instruction provided by PowerPoint slides, verbal discussion, and written materials to support subject matter. The instructor gives an explanation and review of label reading, grocery shopping for heart health, heart healthy  recipe modifications, and ways to make healthier choices when eating out.   Diabetes  Question & Answer:  -Group instruction provided by PowerPoint slides, verbal discussion, and written materials to support subject matter. The instructor gives an explanation and review of diabetes co-morbidities, pre- and post-prandial blood glucose goals, pre-exercise blood glucose goals, signs, symptoms, and treatment of hypoglycemia and hyperglycemia, and foot care basics.   Diabetes Blitz:  -Group instruction provided by PowerPoint slides, verbal discussion, and written materials to support subject matter. The instructor gives an explanation and review of the physiology behind type 1 and type 2 diabetes, diabetes medications and rational behind using different medications, pre- and post-prandial blood glucose recommendations and Hemoglobin A1c goals, diabetes diet, and exercise including blood glucose guidelines for exercising safely.    Portion Distortion:  -Group instruction provided by PowerPoint slides, verbal discussion, written materials, and food models to support subject matter. The instructor gives an explanation of serving size versus portion size, changes in portions sizes over the last 20 years, and what consists of a serving from each food group.   Stress Management:  -Group instruction provided by verbal instruction, video, and written materials to support subject matter.  Instructors review role of stress in heart disease and how to cope with stress positively.     Exercising on Your Own:  -Group instruction provided by verbal instruction, power point, and written materials to support subject.  Instructors discuss benefits of exercise, components of exercise, frequency and intensity of exercise, and end points for exercise.  Also discuss use of nitroglycerin and activating EMS.  Review options of places to exercise outside of rehab.  Review guidelines for sex with heart disease.   Cardiac Drugs I:  -Group instruction provided by verbal instruction and written materials to  support subject.  Instructor reviews cardiac drug classes: antiplatelets, anticoagulants, beta blockers, and statins.  Instructor discusses reasons, side effects, and lifestyle considerations for each drug class.   Cardiac Drugs II:  -Group instruction provided by verbal instruction and written materials to support subject.  Instructor reviews cardiac drug classes: angiotensin converting enzyme inhibitors (ACE-I), angiotensin II receptor blockers (ARBs), nitrates, and calcium channel blockers.  Instructor discusses reasons, side effects, and lifestyle considerations for each drug class.   Anatomy and Physiology of the Circulatory System:  Group verbal and written instruction and models provide basic cardiac anatomy and physiology, with the coronary electrical and arterial systems. Review of: AMI, Angina, Valve disease, Heart Failure, Peripheral Artery Disease, Cardiac Arrhythmia, Pacemakers, and the ICD.   Other Education:  -Group or individual verbal, written, or video instructions that support the educational goals of the cardiac rehab program.   Holiday Eating Survival Tips:  -Group instruction provided by PowerPoint slides, verbal discussion, and written materials to support subject matter. The instructor gives patients tips, tricks, and techniques to help them not only survive but enjoy the holidays despite the onslaught of food that accompanies the holidays.   Knowledge Questionnaire Score: Knowledge Questionnaire Score - 03/12/18 1251      Knowledge Questionnaire Score   Pre Score  23/28       Core Components/Risk Factors/Patient Goals at Admission: Personal Goals and Risk Factors at Admission - 03/12/18 1300      Core Components/Risk Factors/Patient Goals on Admission    Weight Management  Yes;Weight Maintenance    Intervention  Weight Management: Provide education and appropriate resources to help participant work on and attain dietary goals.;Weight Management: Develop a  combined nutrition and exercise program  designed to reach desired caloric intake, while maintaining appropriate intake of nutrient and fiber, sodium and fats, and appropriate energy expenditure required for the weight goal.    Expected Outcomes  Short Term: Continue to assess and modify interventions until short term weight is achieved;Long Term: Adherence to nutrition and physical activity/exercise program aimed toward attainment of established weight goal;Weight Maintenance: Understanding of the daily nutrition guidelines, which includes 25-35% calories from fat, 7% or less cal from saturated fats, less than 200mg  cholesterol, less than 1.5gm of sodium, & 5 or more servings of fruits and vegetables daily;Understanding recommendations for meals to include 15-35% energy as protein, 25-35% energy from fat, 35-60% energy from carbohydrates, less than 200mg  of dietary cholesterol, 20-35 gm of total fiber daily;Understanding of distribution of calorie intake throughout the day with the consumption of 4-5 meals/snacks    Lipids  Yes    Intervention  Provide education and support for participant on nutrition & aerobic/resistive exercise along with prescribed medications to achieve LDL 70mg , HDL >40mg .    Expected Outcomes  Short Term: Participant states understanding of desired cholesterol values and is compliant with medications prescribed. Participant is following exercise prescription and nutrition guidelines.;Long Term: Cholesterol controlled with medications as prescribed, with individualized exercise RX and with personalized nutrition plan. Value goals: LDL < 70mg , HDL > 40 mg.       Core Components/Risk Factors/Patient Goals Review:  Goals and Risk Factor Review    Row Name 04/04/18 1647             Core Components/Risk Factors/Patient Goals Review   Personal Goals Review  Weight Management/Obesity;Lipids       Review  [name]'s vital signs have been stable. [name] has been doing well with exercise.        Expected Outcomes  [name] will continue participate in phase 2 cardiac rehab. Follow exercise, nutrition and lifestyle modification opportunities.          Core Components/Risk Factors/Patient Goals at Discharge (Final Review):  Goals and Risk Factor Review - 04/04/18 1647      Core Components/Risk Factors/Patient Goals Review   Personal Goals Review  Weight Management/Obesity;Lipids    Review  [name]'s vital signs have been stable. [name] has been doing well with exercise.    Expected Outcomes  [name] will continue participate in phase 2 cardiac rehab. Follow exercise, nutrition and lifestyle modification opportunities.       ITP Comments: ITP Comments    Row Name 03/12/18 0935 04/04/18 1642         ITP Comments  Dr. Armanda Magic, Medical Director  30 Day ITP Review. Patient with good attendance and participation in phase 2 cardiac rehab         Comments: See ITP comments.Gladstone Lighter, RN,BSN 04/04/2018 4:58 PM

## 2018-04-05 ENCOUNTER — Encounter (HOSPITAL_COMMUNITY): Payer: Managed Care, Other (non HMO)

## 2018-04-05 ENCOUNTER — Encounter (HOSPITAL_COMMUNITY)
Admission: RE | Admit: 2018-04-05 | Discharge: 2018-04-05 | Disposition: A | Discharge status: Home / Self Care | Payer: Managed Care, Other (non HMO) | Source: Ambulatory Visit | Attending: Internal Medicine | Admitting: Internal Medicine

## 2018-04-05 DIAGNOSIS — I5022 Chronic systolic (congestive) heart failure: Secondary | ICD-10-CM | POA: Diagnosis not present

## 2018-04-08 ENCOUNTER — Encounter (HOSPITAL_COMMUNITY): Payer: Managed Care, Other (non HMO)

## 2018-04-08 ENCOUNTER — Encounter (HOSPITAL_COMMUNITY)
Admission: RE | Admit: 2018-04-08 | Discharge: 2018-04-08 | Disposition: A | Discharge status: Home / Self Care | Payer: Managed Care, Other (non HMO) | Source: Ambulatory Visit | Attending: Internal Medicine | Admitting: Internal Medicine

## 2018-04-08 DIAGNOSIS — I5022 Chronic systolic (congestive) heart failure: Secondary | ICD-10-CM

## 2018-04-10 ENCOUNTER — Encounter (HOSPITAL_COMMUNITY)
Admission: RE | Admit: 2018-04-10 | Discharge: 2018-04-10 | Disposition: A | Discharge status: Home / Self Care | Payer: Managed Care, Other (non HMO) | Source: Ambulatory Visit | Attending: Internal Medicine | Admitting: Internal Medicine

## 2018-04-10 ENCOUNTER — Encounter (HOSPITAL_COMMUNITY): Payer: Managed Care, Other (non HMO)

## 2018-04-10 DIAGNOSIS — I5022 Chronic systolic (congestive) heart failure: Secondary | ICD-10-CM | POA: Diagnosis not present

## 2018-04-12 ENCOUNTER — Encounter (HOSPITAL_COMMUNITY): Payer: Managed Care, Other (non HMO)

## 2018-04-12 ENCOUNTER — Encounter (HOSPITAL_COMMUNITY)
Admission: RE | Admit: 2018-04-12 | Discharge: 2018-04-12 | Disposition: A | Discharge status: Home / Self Care | Payer: Managed Care, Other (non HMO) | Source: Ambulatory Visit | Attending: Internal Medicine | Admitting: Internal Medicine

## 2018-04-12 DIAGNOSIS — I5022 Chronic systolic (congestive) heart failure: Secondary | ICD-10-CM | POA: Diagnosis not present

## 2018-04-15 ENCOUNTER — Encounter (HOSPITAL_COMMUNITY): Payer: Managed Care, Other (non HMO)

## 2018-04-15 ENCOUNTER — Encounter (HOSPITAL_COMMUNITY)
Admission: RE | Admit: 2018-04-15 | Discharge: 2018-04-15 | Disposition: A | Discharge status: Home / Self Care | Payer: Managed Care, Other (non HMO) | Source: Ambulatory Visit | Attending: Internal Medicine | Admitting: Internal Medicine

## 2018-04-15 DIAGNOSIS — I5022 Chronic systolic (congestive) heart failure: Secondary | ICD-10-CM

## 2018-04-17 ENCOUNTER — Encounter (HOSPITAL_COMMUNITY)
Admission: RE | Admit: 2018-04-17 | Discharge: 2018-04-17 | Disposition: A | Discharge status: Home / Self Care | Payer: Managed Care, Other (non HMO) | Source: Ambulatory Visit | Attending: Internal Medicine | Admitting: Internal Medicine

## 2018-04-17 ENCOUNTER — Encounter (HOSPITAL_COMMUNITY): Payer: Managed Care, Other (non HMO)

## 2018-04-17 DIAGNOSIS — I5022 Chronic systolic (congestive) heart failure: Secondary | ICD-10-CM | POA: Diagnosis not present

## 2018-04-19 ENCOUNTER — Encounter (HOSPITAL_COMMUNITY): Payer: Managed Care, Other (non HMO)

## 2018-04-19 ENCOUNTER — Encounter (HOSPITAL_COMMUNITY)
Admission: RE | Admit: 2018-04-19 | Discharge: 2018-04-19 | Disposition: A | Discharge status: Home / Self Care | Payer: Managed Care, Other (non HMO) | Source: Ambulatory Visit | Attending: Internal Medicine | Admitting: Internal Medicine

## 2018-04-19 ENCOUNTER — Telehealth (HOSPITAL_COMMUNITY): Payer: Self-pay | Admitting: Vascular Surgery

## 2018-04-19 DIAGNOSIS — I5022 Chronic systolic (congestive) heart failure: Secondary | ICD-10-CM | POA: Diagnosis not present

## 2018-04-19 NOTE — Telephone Encounter (Signed)
Left pt message to move appt echo and f/u appt w/ DB, db will not be in office on 11/22

## 2018-04-22 ENCOUNTER — Encounter (HOSPITAL_COMMUNITY)
Admission: RE | Admit: 2018-04-22 | Discharge: 2018-04-22 | Disposition: A | Discharge status: Home / Self Care | Payer: Managed Care, Other (non HMO) | Source: Ambulatory Visit | Attending: Internal Medicine | Admitting: Internal Medicine

## 2018-04-22 ENCOUNTER — Encounter (HOSPITAL_COMMUNITY): Payer: Managed Care, Other (non HMO)

## 2018-04-22 DIAGNOSIS — I5022 Chronic systolic (congestive) heart failure: Secondary | ICD-10-CM | POA: Diagnosis not present

## 2018-04-22 NOTE — Progress Notes (Signed)
Angel CooperJames D Davies 47 y.o. male Nutrition Note Spoke with pt. Nutrition Plan and Nutrition Survey goals reviewed with pt. Pt is following a Heart Healthy diet. Pt wants to maintain weight, currently happy with his weight. Heart healthy carb modified eating tips reviewed (label reading, how to build a healthy plate, portion sizes, eating frequently across the day). Pt has Pre-diabetes. Last A1c indicates blood glucose elevated at 6.0. Reviewed complex vs refined carbohydrates and recommended pt replace refined carbohydrates with complex in his diet. Per discussion, pt does not use canned/convenience foods often. Pt does not add salt to food. Pt does not eat out frequently. Pt's wife cooks majority of his foods, and aided patient in filling out his MEDFI[employer] survey, uses heart healthy cooking methods. Pt expressed understanding of the information reviewed. Pt aware of nutrition education classes offered and does plan on attending nutrition classes.  Lab Results  Component Value Date   HGBA1C 6.0 (H) 07/03/2017    Wt Readings from Last 3 Encounters:  03/20/18 189 lb 6 oz (85.9 kg)  03/12/18 191 lb 9.3 oz (86.9 kg)  01/14/18 197 lb (89.4 kg)    Nutrition Diagnosis ? Food-and nutrition-related knowledge deficit related to lack of exposure to information as related to diagnosis of: ? CVD ? Pre-diabetes   Nutrition Intervention ? Pt's individual nutrition plan reviewed with pt. ? Benefits of adopting Carb modified Heart Healthy diet discussed when [employer]s reviewed.  Goal(s)  ? Pt to identify and limit food sources of saturated fat, trans fat, refined carbohydrates and sodium ? Pt able to name foods that affect blood glucose.  Plan:   Pt to attend nutrition classes ? Nutrition I ? Nutrition II ? Portion Distortion   Will provide client-centered nutrition education as part of interdisciplinary care  Monitor and evaluate progress toward nutrition goal with team.    Ross MarcusAubrey Burklin, MS, RD,  LDN 04/22/2018 10:56 AM

## 2018-04-24 ENCOUNTER — Encounter (HOSPITAL_COMMUNITY): Payer: Managed Care, Other (non HMO)

## 2018-04-24 ENCOUNTER — Encounter (HOSPITAL_COMMUNITY)
Admission: RE | Admit: 2018-04-24 | Discharge: 2018-04-24 | Disposition: A | Discharge status: Home / Self Care | Payer: Managed Care, Other (non HMO) | Source: Ambulatory Visit | Attending: Internal Medicine | Admitting: Internal Medicine

## 2018-04-24 DIAGNOSIS — I5022 Chronic systolic (congestive) heart failure: Secondary | ICD-10-CM

## 2018-04-26 ENCOUNTER — Encounter (HOSPITAL_COMMUNITY)
Admission: RE | Admit: 2018-04-26 | Discharge: 2018-04-26 | Disposition: A | Discharge status: Home / Self Care | Payer: Managed Care, Other (non HMO) | Source: Ambulatory Visit | Attending: Internal Medicine | Admitting: Internal Medicine

## 2018-04-26 ENCOUNTER — Encounter (HOSPITAL_COMMUNITY): Payer: Managed Care, Other (non HMO)

## 2018-04-26 DIAGNOSIS — I5022 Chronic systolic (congestive) heart failure: Secondary | ICD-10-CM | POA: Diagnosis not present

## 2018-04-29 ENCOUNTER — Encounter (HOSPITAL_COMMUNITY): Payer: Managed Care, Other (non HMO)

## 2018-04-29 ENCOUNTER — Encounter (HOSPITAL_COMMUNITY)
Admission: RE | Admit: 2018-04-29 | Discharge: 2018-04-29 | Disposition: A | Discharge status: Home / Self Care | Payer: Managed Care, Other (non HMO) | Source: Ambulatory Visit | Attending: Internal Medicine | Admitting: Internal Medicine

## 2018-04-29 DIAGNOSIS — I5022 Chronic systolic (congestive) heart failure: Secondary | ICD-10-CM

## 2018-05-01 ENCOUNTER — Encounter (HOSPITAL_COMMUNITY): Payer: Managed Care, Other (non HMO)

## 2018-05-01 ENCOUNTER — Encounter (HOSPITAL_COMMUNITY)
Admission: RE | Admit: 2018-05-01 | Discharge: 2018-05-01 | Disposition: A | Discharge status: Home / Self Care | Payer: Managed Care, Other (non HMO) | Source: Ambulatory Visit | Attending: Internal Medicine | Admitting: Internal Medicine

## 2018-05-01 DIAGNOSIS — Z79899 Other long term (current) drug therapy: Secondary | ICD-10-CM | POA: Diagnosis not present

## 2018-05-01 DIAGNOSIS — F329 Major depressive disorder, single episode, unspecified: Secondary | ICD-10-CM | POA: Diagnosis not present

## 2018-05-01 DIAGNOSIS — Z87891 Personal history of nicotine dependence: Secondary | ICD-10-CM | POA: Diagnosis not present

## 2018-05-01 DIAGNOSIS — Z7901 Long term (current) use of anticoagulants: Secondary | ICD-10-CM | POA: Insufficient documentation

## 2018-05-01 DIAGNOSIS — I5022 Chronic systolic (congestive) heart failure: Secondary | ICD-10-CM

## 2018-05-01 DIAGNOSIS — Z87442 Personal history of urinary calculi: Secondary | ICD-10-CM | POA: Diagnosis not present

## 2018-05-01 DIAGNOSIS — Z8673 Personal history of transient ischemic attack (TIA), and cerebral infarction without residual deficits: Secondary | ICD-10-CM | POA: Insufficient documentation

## 2018-05-01 DIAGNOSIS — F909 Attention-deficit hyperactivity disorder, unspecified type: Secondary | ICD-10-CM | POA: Diagnosis not present

## 2018-05-01 DIAGNOSIS — F419 Anxiety disorder, unspecified: Secondary | ICD-10-CM | POA: Diagnosis not present

## 2018-05-02 NOTE — Progress Notes (Signed)
Cardiac Individual Treatment Plan  Patient Details  Name: Angel Davies MRN: [id number] Date of Birth: 1971-06-09 Referring Provider:     CARDIAC REHAB PHASE II ORIENTATION from 03/12/2018 in [facility] CARDIAC [facility]  Referring Provider  Lauro Franklin MD      Initial Encounter Date:    CARDIAC REHAB PHASE II ORIENTATION from 03/12/2018 in [facility] CARDIAC REHAB  Date  03/12/18      Visit Diagnosis: Heart failure, chronic systolic (HCC)  Patient's Home Medications on Admission:  Current Outpatient Medications:  .  amphetamine-dextroamphetamine (ADDERALL) 20 MG tablet, Take 20 mg by mouth 3 (three) times daily., Disp: , Rfl:  .  apixaban (ELIQUIS) 5 MG TABS tablet, Take 5 mg by mouth 2 (two) times daily., Disp: , Rfl:  .  atorvastatin (LIPITOR) 80 MG tablet, Take 1 tablet (80 mg total) by mouth daily at 6 PM., Disp: 30 tablet, Rfl: 5 .  carvedilol (COREG) 6.25 MG tablet, Take 1 tablet (6.25 mg total) by mouth 2 (two) times daily with a meal., Disp: 60 tablet, Rfl: 5 .  fenofibrate 160 MG tablet, Take 1 tablet (160 mg total) by mouth daily., Disp: 30 tablet, Rfl: 5 .  folic acid (FOLVITE) 1 MG tablet, Take 1 tablet (1 mg total) by mouth daily., Disp: 30 tablet, Rfl: 0 .  Multiple Vitamin (MULTIVITAMIN WITH MINERALS) TABS tablet, Take 1 tablet by mouth daily., Disp: 100 tablet, Rfl: 0 .  nicotine (NICODERM CQ - DOSED IN MG/24 HOURS) 14 mg/24hr patch, Place 1 patch (14 mg total) onto the skin daily., Disp: 28 patch, Rfl: 0 .  sacubitril-valsartan (ENTRESTO) 97-103 MG, Take 1 tablet by mouth 2 (two) times daily., Disp: 60 tablet, Rfl: 6 .  spironolactone (ALDACTONE) 25 MG tablet, Take 0.5 tablets (12.5 mg total) by mouth at bedtime., Disp: 30 tablet, Rfl: 5 .  thiamine 100 MG tablet, Take 1 tablet (100 mg total) by mouth daily., Disp: 30 tablet, Rfl: 0 .  traMADol (ULTRAM) 50 MG tablet, Take 50 mg by mouth as needed for moderate pain (Every 4-6 hours  PRN)., Disp: , Rfl:  .  traZODone (DESYREL) 50 MG tablet, Take 100-150 mg by mouth at bedtime as needed for sleep., Disp: , Rfl:   Past Medical History: Past Medical History:  Diagnosis Date  . ADHD (attention deficit hyperactivity disorder)   . Anxiety   . CVA (cerebral vascular accident) (HCC) 07/02/2017   "left sided weakness" (07/02/2017)  . Depression   . History of kidney stones     Tobacco Use: Social History   Tobacco Use  Smoking Status Former Smoker  . Packs/day: 0.00  . Years: 0.00  . Pack years: 0.00  Smokeless Tobacco Never Used    Labs: Recent Review Flowsheet Data    Labs for ITP Cardiac and Pulmonary Rehab Latest Ref Rng & Units 07/02/2017 07/03/2017 07/05/2017 07/05/2017 07/05/2017   Cholestrol 0 - 200 mg/dL - 245(Y) - - -   LDLCALC 0 - 99 mg/dL - UNABLE TO CALCULATE IF TRIGLYCERIDE OVER 400 mg/dL - - -   HDL >09 mg/dL - 98(P) - - -   Trlycerides <150 mg/dL - 382(N) - - -   Hemoglobin A1c 4.8 - 5.6 % - 6.0(H) - - -   PHART 7.350 - 7.450 - - 7.408 - -   PCO2ART 32.0 - 48.0 mmHg - - 32.9 - -   HCO3 20.0 - 28.0 mmol/L - - 20.7 24.2 24.3   TCO2  22 - 32 mmol/L 21(L) - 22 25 26    ACIDBASEDEF 0.0 - 2.0 mmol/L - - 3.0(H) 1.0 1.0   O2SAT % - - 95.0 66.0 67.0      Capillary Blood Glucose: Lab Results  Component Value Date   GLUCAP 115 (H) 07/05/2017   GLUCAP 113 (H) 07/04/2017   GLUCAP 147 (H) 07/02/2017     Exercise Target Goals: Exercise Program Goal: Individual exercise prescription set using results from initial 6 min walk test and THRR while considering  patient's activity barriers and safety.   Exercise Prescription Goal: Initial exercise prescription builds to 30-45 minutes a day of aerobic activity, 2-3 days per week.  Home exercise guidelines will be given to patient during program as part of exercise prescription that the participant will acknowledge.  Activity Barriers & Risk Stratification: Activity Barriers & Cardiac Risk Stratification -  03/12/18 1152      Activity Barriers & Cardiac Risk Stratification   Activity Barriers  Back Problems;Deconditioning;Muscular Weakness;Other (comment)    Comments  stroke (L side compromise)    Cardiac Risk Stratification  High       6 Minute Walk: 6 Minute Walk    Row Name 03/12/18 1149         6 Minute Walk   Phase  Initial     Distance  1426 feet     Walk Time  5.34 minutes     # of Rest Breaks  0     MPH  3     METS  4.1     RPE  13     VO2 Peak  14.5     Symptoms  Yes (comment)     Comments  fatigue! Stopped test at 5:34; c/o B shin discomfort/ resolved with rest     Resting HR  81 bpm     Resting BP  100/60     Resting Oxygen Saturation   97 %     Exercise Oxygen Saturation  during 6 min walk  98 %     Max Ex. HR  101 bpm     Max Ex. BP  108/70     2 Minute Post BP  97/69        Oxygen Initial Assessment:   Oxygen Re-Evaluation:   Oxygen Discharge (Final Oxygen Re-Evaluation):   Initial Exercise Prescription: Initial Exercise Prescription - 03/12/18 1100      Date of Initial Exercise RX and Referring Provider   Date  03/12/18    Referring Provider  Lauro Franklin MD      Recumbant Bike   Level  2    Minutes  10    METs  2      NuStep   Level  3    SPM  80    Minutes  10    METs  2      Track   Laps  10    Minutes  10    METs  2.74      Prescription Details   Frequency (times per week)  3    Duration  Progress to 30 minutes of continuous aerobic without signs/symptoms of physical distress      Intensity   THRR 40-80% of Max Heartrate  69-138    Ratings of Perceived Exertion  11-13    Perceived Dyspnea  0-4      Progression   Progression  Continue to progress workloads to maintain intensity without signs/symptoms of physical distress.  Resistance Training   Training Prescription  Yes    Weight  4lbs    Reps  10-15       Perform Capillary Blood Glucose checks as needed.  Exercise Prescription Changes: Exercise  Prescription Changes    Row Name 03/18/18 0955 03/25/18 0949 04/08/18 0945 04/22/18 0948       Response to Exercise   Blood Pressure (Admit)  118/72  120/78  100/76  100/76    Blood Pressure (Exercise)  130/80  130/70  118/64  118/64    Blood Pressure (Exit)  107/73  100/72  104/74  104/74    Heart Rate (Admit)  99 bpm  87 bpm  97 bpm  97 bpm    Heart Rate (Exercise)  126 bpm  110 bpm  145 bpm  145 bpm    Heart Rate (Exit)  95 bpm  84 bpm  92 bpm  92 bpm    Rating of Perceived Exertion (Exercise)  12  11  14  14     Symptoms  none  none  none  none    Duration  Progress to 30 minutes of  aerobic without signs/symptoms of physical distress  Progress to 30 minutes of  aerobic without signs/symptoms of physical distress  Progress to 30 minutes of  aerobic without signs/symptoms of physical distress  Progress to 30 minutes of  aerobic without signs/symptoms of physical distress    Intensity  THRR unchanged  THRR unchanged  THRR unchanged  THRR unchanged      Progression   Progression  Continue to progress workloads to maintain intensity without signs/symptoms of physical distress.  Continue to progress workloads to maintain intensity without signs/symptoms of physical distress.  Continue to progress workloads to maintain intensity without signs/symptoms of physical distress.  Continue to progress workloads to maintain intensity without signs/symptoms of physical distress.    Average METs  3.6  3.5  4.2  4.2      Resistance Training   Training Prescription  Yes  Yes  Yes  Yes    Weight  4lbs  5lbs  5lbs  5lbs    Reps  10-15  10-15  10-15  10-15    Time  10 Minutes  10 Minutes  10 Minutes  10 Minutes      Interval Training   Interval Training  No  No  No  No      Recumbant Bike   Level  2  3 Upright Scifit Bike  4 Upright Scifit Bike  4 Upright Scifit Bike    Minutes  10  10  10  10     METs  5.2  4.3  5.8  5.8      NuStep   Level  3  5  5  5     SPM  80  85  85  85    Minutes  10  10  10   10     METs  2.6  3.2  3.1  3.1      Track   Laps  11  11  16  16     Minutes  10  10  10  10     METs  2.91  2.91  3.78  3.78      Home Exercise Plan   Plans to continue exercise at  -  Home (comment) [facility]  [facility]) [facility]  [facility] (comment)  [facility]    Frequency  -  Add 2 additional days to program exercise sessions.  Add 2 additional days to program exercise sessions.  Add 2 additional days to program exercise sessions.    Initial Home Exercises Provided  -  03/25/18  03/25/18  03/25/18       Exercise Comments: Exercise Comments    Row Name 03/18/18 0955 03/25/18 1106 04/10/18 1032 04/24/18 1100     Exercise Comments  Patient tolerated exercise well without c/o.  Reviewed home exercise guidelines, METs, and goals with patient.  Reviewed METs with patient.  Reviewed METs and goals with patient.       Exercise Goals and Review: Exercise Goals    Row Name 03/12/18 1155             Exercise Goals   Increase Physical Activity  Yes       Intervention  Provide advice, education, support and counseling about physical activity/exercise needs.;Develop an individualized exercise prescription for aerobic and resistive training based on initial evaluation findings, risk stratification, comorbidities and participant's personal goals.       Expected Outcomes  Short Term: Attend rehab on a regular basis to increase amount of physical activity.;Long Term: Add in home exercise to make exercise part of routine and to increase amount of physical activity.;Long Term: Exercising regularly at least 3-5 days a week.       Increase Strength and Stamina  Yes       Intervention  Provide advice, education, support and counseling about physical activity/exercise needs.;Develop an individualized exercise prescription for aerobic and resistive training based on initial evaluation findings,  risk stratification, comorbidities and participant's personal goals.       Expected Outcomes  Short Term: Increase workloads from initial exercise prescription for resistance, speed, and METs.;Long Term: Improve cardiorespiratory fitness, muscular endurance and strength as measured by increased METs and functional capacity ( );Short Term: Perform resistance training exercises routinely during rehab and add in resistance training at home       Able to understand and use rate of perceived exertion (RPE) scale  Yes       Intervention  Provide education and explanation on how to use RPE scale       Expected Outcomes  Short Term: Able to use RPE daily in rehab to express subjective intensity level;Long Term:  Able to use RPE to guide intensity level when exercising independently       Knowledge and understanding of Target Heart Rate Range (THRR)  Yes       Intervention  Provide education and explanation of THRR including how the numbers were predicted and where they are located for reference       Expected Outcomes  Short Term: Able to state/look up THRR;Short Term: Able to use daily as guideline for intensity in rehab;Long Term: Able to use THRR to govern intensity when exercising independently       Able to check pulse independently  Yes       Intervention  Review the importance of being able to check your own pulse for safety during independent exercise;Provide education and demonstration on how to check pulse in carotid and radial arteries.       Expected Outcomes  Short Term: Able to explain why pulse checking is important during independent exercise;Long Term: Able to check pulse independently and accurately       Understanding of Exercise Prescription  Yes  Intervention  Provide education, explanation, and written materials on patient's individual exercise prescription       Expected Outcomes  Short Term: Able to explain program exercise prescription;Long Term: Able to explain home exercise  prescription to exercise independently          Exercise Goals Re-Evaluation : Exercise Goals Re-Evaluation    Row Name 03/18/18 0955 03/25/18 1106 04/24/18 1100         Exercise Goal Re-Evaluation   Exercise Goals Review  Able to understand and use rate of perceived exertion (RPE) scale  Able to understand and use rate of perceived exertion (RPE) scale;Understanding of Exercise Prescription;Knowledge and understanding of Target Heart Rate Range (THRR)  Able to understand and use rate of perceived exertion (RPE) scale;Understanding of Exercise Prescription;Knowledge and understanding of Target Heart Rate Range (THRR);Increase Physical Activity;Able to check pulse independently     Comments  Patient able to understand and use RPE scale appropriately.  Reviewed home exercise guidelines with patient including THRR, RPE scale, and endpoints for exercise. [facility] and [facility]. Pt is also walking and using a balance board as his mode of aerobic exercise. Pt will begin using the [facility] before resuming classes.  Patient has a pedometer and monitors his steps each day. [facility]. Encouraged [facility] to start at [facility] 1-2 days/week while he's still attending [facility] in case of any questions, and pt is amenable to this. Reviewed THRR. Pt know how to manually count his pulse.     Expected Outcomes  Increase workloads as tolerated to help improve cardiorespiratory fitness and improve stamina.  Patient will exercise 30 minutes at least 2 days in addition to exercise at [facility] to help improve cardiorespiratory fitness and increase strength and stamina.  Patient will exercise 30 minutes on Tuesdays and/or Thursdays [facility].        Discharge Exercise Prescription (Final Exercise Prescription Changes): Exercise Prescription Changes - 04/22/18 0948      Response to  Exercise   Blood Pressure (Admit)  100/76    Blood Pressure (Exercise)  118/64    Blood Pressure (Exit)  104/74    Heart Rate (Admit)  97 bpm    Heart Rate (Exercise)  145 bpm    Heart Rate (Exit)  92 bpm    Rating of Perceived Exertion (Exercise)  14    Symptoms  none    Duration  Progress to 30 minutes of  aerobic without signs/symptoms of physical distress    Intensity  THRR unchanged      Progression   Progression  Continue to progress workloads to maintain intensity without signs/symptoms of physical distress.    Average METs  4.2      Resistance Training   Training Prescription  Yes    Weight  5lbs    Reps  10-15    Time  10 Minutes      Interval Training   Interval Training  No      Recumbant Bike   Level  4   Upright Scifit Bike   Minutes  10    METs  5.8      NuStep   Level  5    SPM  85    Minutes  10    METs  3.1      Track   Laps  16    Minutes  10    METs  3.78      Home Exercise Plan   Plans to continue exercise at  Home (comment)   [facility]   Frequency  Add 2 additional days to program exercise sessions.    Initial Home Exercises Provided  03/25/18       Nutrition:  Target Goals: Understanding of nutrition guidelines, daily intake of sodium 1500mg , cholesterol 200mg , calories 30% from fat and 7% or less from saturated fats, daily to have 5 or more servings of fruits and vegetables.  Biometrics: Pre Biometrics - 03/12/18 1155      Pre Biometrics   Height  5\' 9"  (1.753 m)    Weight  86.9 kg    Waist Circumference  38.5 inches    Hip Circumference  38.5 inches    Waist to Hip Ratio  1 %    BMI (Calculated)  28.28    Triceps Skinfold  20 mm    % Body Fat  27.5 %    Grip Strength  47 kg    Flexibility  12 in    Single Leg Stand  30 seconds        Nutrition Therapy Plan and Nutrition Goals: Nutrition Therapy & Goals - 04/22/18 1057      Nutrition Therapy   Diet  heart healthy, carb modified        Personal Nutrition Goals   Nutrition Goal  Food-and nutrition-related knowledge deficit related to lack of exposure to information as related to diagnosis of: ? CVD ? Pre-diabetes      Intervention Plan   Intervention  Prescribe, educate and counsel regarding individualized specific dietary modifications aiming towards targeted core components such as weight, hypertension, lipid management, diabetes, heart failure and other comorbidities.    Expected Outcomes  Short Term Goal: Understand basic principles of dietary content, such as calories, fat, sodium, cholesterol and nutrients.;Long Term Goal: Adherence to prescribed nutrition plan.       Nutrition Assessments: Nutrition Assessments - 04/22/18 1102      MEDFICTS Scores   Pre Score  18       Nutrition Goals Re-Evaluation:   Nutrition Goals Re-Evaluation:   Nutrition Goals Discharge (Final Nutrition Goals Re-Evaluation):   Psychosocial: Target Goals: Acknowledge presence or absence of significant depression and/or stress, maximize coping skills, provide positive support system. Participant is able to verbalize types and ability to use techniques and skills needed for reducing stress and depression.  Initial Review & Psychosocial Screening: Initial Psych Review & Screening - 03/12/18 1508      Initial Review   Current issues with  (S) Current Anxiety/Panic   health related anxiety     Family Dynamics   Good Support System?  Yes   spouse     Barriers   Psychosocial barriers to participate in program  The patient should benefit from training in stress management and relaxation.      Screening Interventions   Interventions  Encouraged to exercise;Program counselor consult;To provide support and resources with identified psychosocial needs;Provide feedback about the scores to participant    Expected Outcomes  Short Term goal: Utilizing psychosocial counselor, staff and physician to assist with identification of specific  Stressors or current issues interfering with healing process. Setting desired goal for each stressor or current issue identified.;Short Term goal: Identification and review with participant of any Quality of Life or Depression concerns found by scoring the  questionnaire.       Quality of Life Scores: Quality of Life - 03/12/18 1256      Quality of Life   Select  Quality of Life      Quality of Life Scores   Health/Function Pre  15.67 %    Socioeconomic Pre  9.79 %    Psych/Spiritual Pre  15.93 %    Family Pre  22.8 %    GLOBAL Pre  15.56 %      Scores of 19 and below usually indicate a poorer quality of life in these areas.  A difference of  2-3 points is a clinically meaningful difference.  A difference of 2-3 points in the total score of the Quality of Life Index has been associated with significant improvement in overall quality of life, self-image, physical symptoms, and general health in studies assessing change in quality of life.  PHQ-9: Recent Review Flowsheet Data    Depression screen [facility] 2/9 03/18/2018 08/13/2017   Decreased Interest 0 0   Down, Depressed, Hopeless 0 0   PHQ - 2 Score 0 0     Interpretation of Total Score  Total Score Depression Severity:  1-4 = Minimal depression, 5-9 = Mild depression, 10-14 = Moderate depression, 15-19 = Moderately severe depression, 20-27 = Severe depression   Psychosocial Evaluation and Intervention:   Psychosocial Re-Evaluation: Psychosocial Re-Evaluation    Row Name 04/04/18 1645 05/02/18 1332           Psychosocial Re-Evaluation   Current issues with  Current Anxiety/Panic  Current Anxiety/Panic      Comments  [name] has not voiced any increased stressors so far  [name] has not voiced any increased stressors so far      Expected Outcomes  Will assess and offer support as needed  Will assess and offer support as needed      Interventions  Stress management education;Encouraged to attend Cardiac Rehabilitation for the exercise   Stress management education;Encouraged to attend Cardiac Rehabilitation for the exercise      Continue Psychosocial Services   Follow up required by staff  Follow up required by staff         Psychosocial Discharge (Final Psychosocial Re-Evaluation): Psychosocial Re-Evaluation - 05/02/18 1332      Psychosocial Re-Evaluation   Current issues with  Current Anxiety/Panic    Comments  [name] has not voiced any increased stressors so far    Expected Outcomes  Will assess and offer support as needed    Interventions  Stress management education;Encouraged to attend Cardiac Rehabilitation for the exercise    Continue Psychosocial Services   Follow up required by staff       Vocational Rehabilitation: Provide vocational rehab assistance to qualifying candidates.   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 Participant will state understanding/return demonstration of topics presented.  Learning Barriers/Preferences: Learning Barriers/Preferences - 03/12/18 1250      Learning Barriers/Preferences   Learning Barriers  Exercise Concerns   recovering from stroke, L-sided weakness   Learning Preferences  Skilled Demonstration       Education Topics: Count Your Pulse:  -Group instruction provided by verbal instruction, demonstration, patient participation and  written materials to support subject.  Instructors address importance of being able to find your pulse and how to count your pulse when at home without a heart monitor.  Patients get hands on experience counting their pulse with staff help and individually.   Heart Attack, Angina, and Risk Factor Modification:  -Group  instruction provided by verbal instruction, video, and written materials to support subject.  Instructors address signs and symptoms of angina and heart attacks.    Also discuss risk factors for heart disease and how to make changes to improve heart health risk factors.   Functional Fitness:  -Group instruction provided by verbal instruction, demonstration, patient participation, and written materials to support subject.  Instructors address safety measures for doing things around the house.  Discuss how to get up and down off the floor, how to pick things up properly, how to safely get out of a chair without assistance, and balance training.   Meditation and Mindfulness:  -Group instruction provided by verbal instruction, patient participation, and written materials to support subject.  Instructor addresses importance of mindfulness and meditation practice to help reduce stress and improve awareness.  Instructor also leads participants through a meditation exercise.    Stretching for Flexibility and Mobility:  -Group instruction provided by verbal instruction, patient participation, and written materials to support subject.  Instructors lead participants through series of stretches that are designed to increase flexibility thus improving mobility.  These stretches are additional exercise for major muscle groups that are typically performed during regular warm up and cool down.   Hands Only CPR:  -Group verbal, video, and participation provides a basic overview of AHA guidelines for community CPR. Role-play of emergencies allow participants the opportunity to practice calling for help and chest compression technique with discussion of AED use.   Hypertension: -Group verbal and written instruction that provides a basic overview of hypertension including the most recent diagnostic guidelines, risk factor reduction with self-care instructions and medication management.    Nutrition I class:  Heart Healthy Eating:  -Group instruction provided by PowerPoint slides, verbal discussion, and written materials to support subject matter. The instructor gives an explanation and review of the Therapeutic Lifestyle Changes diet recommendations, which includes a discussion on lipid goals, dietary fat, sodium, fiber, plant stanol/sterol esters, sugar, and the components of a well-balanced, healthy diet.   Nutrition II class: Lifestyle Skills:  -Group instruction provided by PowerPoint slides, verbal discussion, and written materials to support subject matter. The instructor gives an explanation and review of label reading, grocery shopping for heart health, heart healthy recipe modifications, and ways to make healthier choices when eating out.   Diabetes Question & Answer:  -Group instruction provided by PowerPoint slides, verbal discussion, and written materials to support subject matter. The instructor gives an explanation and review of diabetes co-morbidities, pre- and post-prandial blood glucose goals, pre-exercise blood glucose goals, signs, symptoms, and treatment of hypoglycemia and hyperglycemia, and foot care basics.   Diabetes Blitz:  -Group instruction provided by PowerPoint slides, verbal discussion, and written materials to support subject matter. The instructor gives an explanation and review of the physiology behind type 1 and type 2 diabetes, diabetes medications and rational behind using different medications, pre- and post-prandial blood glucose recommendations and Hemoglobin A1c goals, diabetes diet, and exercise including blood glucose guidelines for exercising safely.    Portion Distortion:  -Group instruction provided by PowerPoint slides, verbal discussion, written materials, and food models to support subject matter. The instructor gives an explanation  of serving size versus portion size, changes in portions sizes over the last 20 years, and what consists of a serving from  each food group.   Stress Management:  -Group instruction provided by verbal instruction, video, and written materials to support subject matter.  Instructors review role of stress in heart disease and how to cope with stress positively.     Exercising on Your Own:  -Group instruction provided by verbal instruction, power point, and written materials to support subject.  Instructors discuss benefits of exercise, components of exercise, frequency and intensity of exercise, and end points for exercise.  Also discuss use of nitroglycerin and activating EMS.  Review options of places to exercise outside of rehab.  Review guidelines for sex with heart disease.   Cardiac Drugs I:  -Group instruction provided by verbal instruction and written materials to support subject.  Instructor reviews cardiac drug classes: antiplatelets, anticoagulants, beta blockers, and statins.  Instructor discusses reasons, side effects, and lifestyle considerations for each drug class.   Cardiac Drugs II:  -Group instruction provided by verbal instruction and written materials to support subject.  Instructor reviews cardiac drug classes: angiotensin converting enzyme inhibitors (ACE-I), angiotensin II receptor blockers (ARBs), nitrates, and calcium channel blockers.  Instructor discusses reasons, side effects, and lifestyle considerations for each drug class.   Anatomy and Physiology of the Circulatory System:  Group verbal and written instruction and models provide basic cardiac anatomy and physiology, with the coronary electrical and arterial systems. Review of: AMI, Angina, Valve disease, Heart Failure, Peripheral Artery Disease, Cardiac Arrhythmia, Pacemakers, and the ICD.   CARDIAC REHAB PHASE II EXERCISE from 04/24/2018 in [facility] CARDIAC REHAB  Date  04/24/18  Instruction Review Code  2- Demonstrated Understanding      Other Education:  -Group or individual verbal, written, or video  instructions that support the educational goals of the cardiac rehab program.   Holiday Eating Survival Tips:  -Group instruction provided by PowerPoint slides, verbal discussion, and written materials to support subject matter. The instructor gives patients tips, tricks, and techniques to help them not only survive but enjoy the holidays despite the onslaught of food that accompanies the holidays.   Knowledge Questionnaire Score: Knowledge Questionnaire Score - 03/12/18 1251      Knowledge Questionnaire Score   Pre Score  23/28       Core Components/Risk Factors/Patient Goals at Admission: Personal Goals and Risk Factors at Admission - 03/12/18 1300      Core Components/Risk Factors/Patient Goals on Admission    Weight Management  Yes;Weight Maintenance    Intervention  Weight Management: Provide education and appropriate resources to help participant work on and attain dietary goals.;Weight Management: Develop a combined nutrition and exercise program designed to reach desired caloric intake, while maintaining appropriate intake of nutrient and fiber, sodium and fats, and appropriate energy expenditure required for the weight goal.    Expected Outcomes  Short Term: Continue to assess and modify interventions until short term weight is achieved;Long Term: Adherence to nutrition and physical activity/exercise program aimed toward attainment of established weight goal;Weight Maintenance: Understanding of the daily nutrition guidelines, which includes 25-35% calories from fat, 7% or less cal from saturated fats, less than 200mg  cholesterol, less than 1.5gm of sodium, & 5 or more servings of fruits and vegetables daily;Understanding recommendations for meals to include 15-35% energy as protein, 25-35% energy from fat, 35-60% energy from carbohydrates, less than 200mg  of dietary cholesterol, 20-35 gm of total fiber daily;Understanding  of distribution of calorie intake throughout the day with the  consumption of 4-5 meals/snacks    Lipids  Yes    Intervention  Provide education and support for participant on nutrition & aerobic/resistive exercise along with prescribed medications to achieve LDL 70mg , HDL >40mg .    Expected Outcomes  Short Term: Participant states understanding of desired cholesterol values and is compliant with medications prescribed. Participant is following exercise prescription and nutrition guidelines.;Long Term: Cholesterol controlled with medications as prescribed, with individualized exercise RX and with personalized nutrition plan. Value goals: LDL < 70mg , HDL > 40 mg.       Core Components/Risk Factors/Patient Goals Review:  Goals and Risk Factor Review    Row Name 04/04/18 1647 05/02/18 1333           Core Components/Risk Factors/Patient Goals Review   Personal Goals Review  Weight Management/Obesity;Lipids  Weight Management/Obesity;Lipids      Review  [name]'s vital signs have been stable. [name] has been doing well with exercise.  [name]'s vital signs have been stable. [name] has been doing well with exercise.      Expected Outcomes  [name] will continue participate in phase 2 cardiac rehab. Follow exercise, nutrition and lifestyle modification opportunities.  [name] will continue participate in phase 2 cardiac rehab. Follow exercise, nutrition and lifestyle modification opportunities.         Core Components/Risk Factors/Patient Goals at Discharge (Final Review):  Goals and Risk Factor Review - 05/02/18 1333      Core Components/Risk Factors/Patient Goals Review   Personal Goals Review  Weight Management/Obesity;Lipids    Review  [name]'s vital signs have been stable. [name] has been doing well with exercise.    Expected Outcomes  [name] will continue participate in phase 2 cardiac rehab. Follow exercise, nutrition and lifestyle modification opportunities.       ITP Comments: ITP Comments    Row Name 03/12/18 0935 04/04/18 1642 05/02/18 1332       ITP Comments  Dr.  Armanda Magic, Medical Director  30 Day ITP Review. Patient with good attendance and participation in phase 2 cardiac rehab  30 Day ITP Review. Patient with good attendance and participation in phase 2 cardiac rehab        Comments: See ITP comments.Gladstone Lighter, RN,BSN 05/02/2018 1:36 PM

## 2018-05-03 ENCOUNTER — Encounter (HOSPITAL_COMMUNITY): Payer: Managed Care, Other (non HMO)

## 2018-05-03 ENCOUNTER — Encounter (HOSPITAL_COMMUNITY)
Admission: RE | Admit: 2018-05-03 | Discharge: 2018-05-03 | Disposition: A | Discharge status: Home / Self Care | Payer: Managed Care, Other (non HMO) | Source: Ambulatory Visit | Attending: Internal Medicine | Admitting: Internal Medicine

## 2018-05-03 DIAGNOSIS — I5022 Chronic systolic (congestive) heart failure: Secondary | ICD-10-CM

## 2018-05-06 ENCOUNTER — Encounter (HOSPITAL_COMMUNITY): Payer: Managed Care, Other (non HMO)

## 2018-05-06 ENCOUNTER — Encounter (HOSPITAL_COMMUNITY)
Admission: RE | Admit: 2018-05-06 | Discharge: 2018-05-06 | Disposition: A | Discharge status: Home / Self Care | Payer: Managed Care, Other (non HMO) | Source: Ambulatory Visit | Attending: Internal Medicine | Admitting: Internal Medicine

## 2018-05-06 DIAGNOSIS — I5022 Chronic systolic (congestive) heart failure: Secondary | ICD-10-CM | POA: Diagnosis not present

## 2018-05-08 ENCOUNTER — Encounter (HOSPITAL_COMMUNITY)
Admission: RE | Admit: 2018-05-08 | Discharge: 2018-05-08 | Disposition: A | Discharge status: Home / Self Care | Payer: Managed Care, Other (non HMO) | Source: Ambulatory Visit | Attending: Internal Medicine | Admitting: Internal Medicine

## 2018-05-08 ENCOUNTER — Encounter (HOSPITAL_COMMUNITY): Payer: Managed Care, Other (non HMO)

## 2018-05-08 DIAGNOSIS — I5022 Chronic systolic (congestive) heart failure: Secondary | ICD-10-CM

## 2018-05-10 ENCOUNTER — Encounter (HOSPITAL_COMMUNITY)
Admission: RE | Admit: 2018-05-10 | Discharge: 2018-05-10 | Disposition: A | Discharge status: Home / Self Care | Payer: Managed Care, Other (non HMO) | Source: Ambulatory Visit | Attending: Internal Medicine | Admitting: Internal Medicine

## 2018-05-10 ENCOUNTER — Encounter (HOSPITAL_COMMUNITY): Payer: Managed Care, Other (non HMO)

## 2018-05-10 DIAGNOSIS — I5022 Chronic systolic (congestive) heart failure: Secondary | ICD-10-CM

## 2018-05-13 ENCOUNTER — Encounter (HOSPITAL_COMMUNITY)
Admission: RE | Admit: 2018-05-13 | Discharge: 2018-05-13 | Disposition: A | Discharge status: Home / Self Care | Payer: Managed Care, Other (non HMO) | Source: Ambulatory Visit | Attending: Internal Medicine | Admitting: Internal Medicine

## 2018-05-13 ENCOUNTER — Encounter (HOSPITAL_COMMUNITY): Payer: Managed Care, Other (non HMO)

## 2018-05-13 DIAGNOSIS — I5022 Chronic systolic (congestive) heart failure: Secondary | ICD-10-CM

## 2018-05-13 NOTE — Progress Notes (Signed)
[name]'s blood pressure was noted at 86/52. Patient denied being lightheaded or dizzy. [name] was given 2 cup's of Gatorade. Recheck sitting blood pressure 106/70 sitting. Standing blood pressure 101/68. Jasmine at the heart failure clinic called and notified. [name] did not eat breakfast this morning. Patient was given a banana and reminded to eat breakfast before coming to exercise. Patient states understanding.Gladstone Lighter, RN,BSN 05/13/2018 2:22 PM

## 2018-05-15 ENCOUNTER — Encounter (HOSPITAL_COMMUNITY)
Admission: RE | Admit: 2018-05-15 | Discharge: 2018-05-15 | Disposition: A | Discharge status: Home / Self Care | Payer: Managed Care, Other (non HMO) | Source: Ambulatory Visit | Attending: Internal Medicine | Admitting: Internal Medicine

## 2018-05-15 ENCOUNTER — Encounter (HOSPITAL_COMMUNITY): Payer: Managed Care, Other (non HMO)

## 2018-05-15 DIAGNOSIS — I5022 Chronic systolic (congestive) heart failure: Secondary | ICD-10-CM | POA: Diagnosis not present

## 2018-05-17 ENCOUNTER — Encounter (HOSPITAL_COMMUNITY): Payer: Managed Care, Other (non HMO)

## 2018-05-17 ENCOUNTER — Encounter (HOSPITAL_COMMUNITY)
Admission: RE | Admit: 2018-05-17 | Discharge: 2018-05-17 | Disposition: A | Discharge status: Home / Self Care | Payer: Managed Care, Other (non HMO) | Source: Ambulatory Visit | Attending: Internal Medicine | Admitting: Internal Medicine

## 2018-05-17 DIAGNOSIS — I5022 Chronic systolic (congestive) heart failure: Secondary | ICD-10-CM | POA: Diagnosis not present

## 2018-05-20 ENCOUNTER — Other Ambulatory Visit (HOSPITAL_COMMUNITY): Payer: Self-pay | Admitting: Internal Medicine

## 2018-05-20 ENCOUNTER — Encounter (HOSPITAL_COMMUNITY)
Admission: RE | Admit: 2018-05-20 | Discharge: 2018-05-20 | Disposition: A | Discharge status: Home / Self Care | Payer: Managed Care, Other (non HMO) | Source: Ambulatory Visit | Attending: Internal Medicine | Admitting: Internal Medicine

## 2018-05-20 ENCOUNTER — Encounter (HOSPITAL_COMMUNITY): Payer: Managed Care, Other (non HMO)

## 2018-05-20 DIAGNOSIS — I5022 Chronic systolic (congestive) heart failure: Secondary | ICD-10-CM | POA: Diagnosis not present

## 2018-05-22 ENCOUNTER — Encounter (HOSPITAL_COMMUNITY)
Admission: RE | Admit: 2018-05-22 | Discharge: 2018-05-22 | Disposition: A | Discharge status: Home / Self Care | Payer: Managed Care, Other (non HMO) | Source: Ambulatory Visit | Attending: Internal Medicine | Admitting: Internal Medicine

## 2018-05-22 ENCOUNTER — Encounter (HOSPITAL_COMMUNITY): Payer: Managed Care, Other (non HMO)

## 2018-05-22 DIAGNOSIS — I5022 Chronic systolic (congestive) heart failure: Secondary | ICD-10-CM

## 2018-05-24 ENCOUNTER — Encounter (HOSPITAL_COMMUNITY): Payer: Managed Care, Other (non HMO)

## 2018-05-24 ENCOUNTER — Encounter (HOSPITAL_COMMUNITY)
Admission: RE | Admit: 2018-05-24 | Discharge: 2018-05-24 | Disposition: A | Discharge status: Home / Self Care | Payer: Managed Care, Other (non HMO) | Source: Ambulatory Visit | Attending: Internal Medicine | Admitting: Internal Medicine

## 2018-05-24 DIAGNOSIS — I5022 Chronic systolic (congestive) heart failure: Secondary | ICD-10-CM | POA: Diagnosis not present

## 2018-05-27 ENCOUNTER — Encounter (HOSPITAL_COMMUNITY): Payer: Managed Care, Other (non HMO)

## 2018-05-29 ENCOUNTER — Encounter (HOSPITAL_COMMUNITY): Payer: Managed Care, Other (non HMO)

## 2018-05-29 ENCOUNTER — Encounter (HOSPITAL_COMMUNITY)
Admission: RE | Admit: 2018-05-29 | Discharge: 2018-05-29 | Disposition: A | Discharge status: Home / Self Care | Payer: Managed Care, Other (non HMO) | Source: Ambulatory Visit | Attending: Internal Medicine | Admitting: Internal Medicine

## 2018-05-29 DIAGNOSIS — I5022 Chronic systolic (congestive) heart failure: Secondary | ICD-10-CM

## 2018-05-30 NOTE — Progress Notes (Signed)
Cardiac Individual Treatment Plan  Patient Details  Name: Angel Davies MRN: [id number] Date of Birth: 1970/09/13 Referring Provider:     CARDIAC REHAB PHASE II ORIENTATION from 03/12/2018 in [facility] CARDIAC [facility]  Referring Provider  Lauro Franklin MD      Initial Encounter Date:    CARDIAC REHAB PHASE II ORIENTATION from 03/12/2018 in [facility] CARDIAC REHAB  Date  03/12/18      Visit Diagnosis: Heart failure, chronic systolic (HCC)  Patient's Home Medications on Admission:  Current Outpatient Medications:  .  amphetamine-dextroamphetamine (ADDERALL) 20 MG tablet, Take 20 mg by mouth 3 (three) times daily., Disp: , Rfl:  .  apixaban (ELIQUIS) 5 MG TABS tablet, Take 5 mg by mouth 2 (two) times daily., Disp: , Rfl:  .  atorvastatin (LIPITOR) 80 MG tablet, Take 1 tablet (80 mg total) by mouth daily at 6 PM., Disp: 30 tablet, Rfl: 5 .  carvedilol (COREG) 6.25 MG tablet, Take 1 tablet (6.25 mg total) by mouth 2 (two) times daily with a meal., Disp: 60 tablet, Rfl: 5 .  fenofibrate 160 MG tablet, TAKE ONE TABLET BY MOUTH ONE TIME DAILY , Disp: 30 tablet, Rfl: 4 .  folic acid (FOLVITE) 1 MG tablet, Take 1 tablet (1 mg total) by mouth daily., Disp: 30 tablet, Rfl: 0 .  Multiple Vitamin (MULTIVITAMIN WITH MINERALS) TABS tablet, Take 1 tablet by mouth daily., Disp: 100 tablet, Rfl: 0 .  nicotine (NICODERM CQ - DOSED IN MG/24 HOURS) 14 mg/24hr patch, Place 1 patch (14 mg total) onto the skin daily., Disp: 28 patch, Rfl: 0 .  sacubitril-valsartan (ENTRESTO) 97-103 MG, Take 1 tablet by mouth 2 (two) times daily., Disp: 60 tablet, Rfl: 6 .  spironolactone (ALDACTONE) 25 MG tablet, Take 0.5 tablets (12.5 mg total) by mouth at bedtime., Disp: 30 tablet, Rfl: 5 .  thiamine 100 MG tablet, Take 1 tablet (100 mg total) by mouth daily., Disp: 30 tablet, Rfl: 0 .  traMADol (ULTRAM) 50 MG tablet, Take 50 mg by mouth as needed for moderate pain (Every 4-6 hours  PRN)., Disp: , Rfl:  .  traZODone (DESYREL) 50 MG tablet, Take 100-150 mg by mouth at bedtime as needed for sleep., Disp: , Rfl:   Past Medical History: Past Medical History:  Diagnosis Date  . ADHD (attention deficit hyperactivity disorder)   . Anxiety   . CVA (cerebral vascular accident) (HCC) 07/02/2017   "left sided weakness" (07/02/2017)  . Depression   . History of kidney stones     Tobacco Use: Social History   Tobacco Use  Smoking Status Former Smoker  . Packs/day: 0.00  . Years: 0.00  . Pack years: 0.00  Smokeless Tobacco Never Used    Labs: Recent Review Flowsheet Data    Labs for ITP Cardiac and Pulmonary Rehab Latest Ref Rng & Units 07/02/2017 07/03/2017 07/05/2017 07/05/2017 07/05/2017   Cholestrol 0 - 200 mg/dL - 409(W) - - -   LDLCALC 0 - 99 mg/dL - UNABLE TO CALCULATE IF TRIGLYCERIDE OVER 400 mg/dL - - -   HDL >11 mg/dL - 91(Y) - - -   Trlycerides <150 mg/dL - 782(N) - - -   Hemoglobin A1c 4.8 - 5.6 % - 6.0(H) - - -   PHART 7.350 - 7.450 - - 7.408 - -   PCO2ART 32.0 - 48.0 mmHg - - 32.9 - -   HCO3 20.0 - 28.0 mmol/L - - 20.7 24.2 24.3   TCO2  22 - 32 mmol/L 21(L) - 22 25 26    ACIDBASEDEF 0.0 - 2.0 mmol/L - - 3.0(H) 1.0 1.0   O2SAT % - - 95.0 66.0 67.0      Capillary Blood Glucose: Lab Results  Component Value Date   GLUCAP 115 (H) 07/05/2017   GLUCAP 113 (H) 07/04/2017   GLUCAP 147 (H) 07/02/2017     Exercise Target Goals: Exercise Program Goal: Individual exercise prescription set using results from initial 6 min walk test and THRR while considering  patient's activity barriers and safety.   Exercise Prescription Goal: Starting with aerobic activity 30 plus minutes a day, 3 days per week for initial exercise prescription. Provide home exercise prescription and guidelines that participant acknowledges understanding prior to discharge.  Activity Barriers & Risk Stratification: Activity Barriers & Cardiac Risk Stratification - 03/12/18 1152       Activity Barriers & Cardiac Risk Stratification   Activity Barriers  Back Problems;Deconditioning;Muscular Weakness;Other (comment)    Comments  stroke (L side compromise)    Cardiac Risk Stratification  High       6 Minute Walk: 6 Minute Walk    Row Name 03/12/18 1149         6 Minute Walk   Phase  Initial     Distance  1426 feet     Walk Time  5.34 minutes     # of Rest Breaks  0     MPH  3     METS  4.1     RPE  13     VO2 Peak  14.5     Symptoms  Yes (comment)     Comments  fatigue! Stopped test at 5:34; c/o B shin discomfort/ resolved with rest     Resting HR  81 bpm     Resting BP  100/60     Resting Oxygen Saturation   97 %     Exercise Oxygen Saturation  during 6 min walk  98 %     Max Ex. HR  101 bpm     Max Ex. BP  108/70     2 Minute Post BP  97/69        Oxygen Initial Assessment:   Oxygen Re-Evaluation:   Oxygen Discharge (Final Oxygen Re-Evaluation):   Initial Exercise Prescription: Initial Exercise Prescription - 03/12/18 1100      Date of Initial Exercise RX and Referring Provider   Date  03/12/18    Referring Provider  Lauro Franklin MD      Recumbant Bike   Level  2    Minutes  10    METs  2      NuStep   Level  3    SPM  80    Minutes  10    METs  2      Track   Laps  10    Minutes  10    METs  2.74      Prescription Details   Frequency (times per week)  3    Duration  Progress to 30 minutes of continuous aerobic without signs/symptoms of physical distress      Intensity   THRR 40-80% of Max Heartrate  69-138    Ratings of Perceived Exertion  11-13    Perceived Dyspnea  0-4      Progression   Progression  Continue to progress workloads to maintain intensity without signs/symptoms of physical distress.      Resistance Training  Training Prescription  Yes    Weight  4lbs    Reps  10-15       Perform Capillary Blood Glucose checks as needed.  Exercise Prescription Changes: Exercise Prescription Changes    Row  Name 03/18/18 0955 03/25/18 0949 04/08/18 0945 04/22/18 0948 05/06/18 0956     Response to Exercise   Blood Pressure (Admit)  118/72  120/78  100/76  100/76  144/82   Blood Pressure (Exercise)  130/80  130/70  118/64  118/64  134/76   Blood Pressure (Exit)  107/73  100/72  104/74  104/74  90/68   Heart Rate (Admit)  99 bpm  87 bpm  97 bpm  97 bpm  106 bpm   Heart Rate (Exercise)  126 bpm  110 bpm  145 bpm  145 bpm  143 bpm   Heart Rate (Exit)  95 bpm  84 bpm  92 bpm  92 bpm  103 bpm   Rating of Perceived Exertion (Exercise)  12  11  14  14  13    Symptoms  none  none  none  none  none   Duration  Progress to 30 minutes of  aerobic without signs/symptoms of physical distress  Progress to 30 minutes of  aerobic without signs/symptoms of physical distress  Progress to 30 minutes of  aerobic without signs/symptoms of physical distress  Progress to 30 minutes of  aerobic without signs/symptoms of physical distress  Progress to 30 minutes of  aerobic without signs/symptoms of physical distress   Intensity  THRR unchanged  THRR unchanged  THRR unchanged  THRR unchanged  THRR unchanged     Progression   Progression  Continue to progress workloads to maintain intensity without signs/symptoms of physical distress.  Continue to progress workloads to maintain intensity without signs/symptoms of physical distress.  Continue to progress workloads to maintain intensity without signs/symptoms of physical distress.  Continue to progress workloads to maintain intensity without signs/symptoms of physical distress.  Continue to progress workloads to maintain intensity without signs/symptoms of physical distress.   Average METs  3.6  3.5  4.2  4.2  4.3     Resistance Training   Training Prescription  Yes  Yes  Yes  Yes  Yes   Weight  4lbs  5lbs  5lbs  5lbs  9lbs   Reps  10-15  10-15  10-15  10-15  10-15   Time  10 Minutes  10 Minutes  10 Minutes  10 Minutes  10 Minutes     Interval Training   Interval Training   No  No  No  No  No     Recumbant Bike   Level  2  3 Upright Scifit Bike  4 Upright Scifit Bike  4 Upright Scifit Bike  -   Minutes  10  10  10  10   -   METs  5.2  4.3  5.8  5.8  -     NuStep   Level  3  5  5  5  6    SPM  80  85  85  85  85   Minutes  10  10  10  10  10    METs  2.6  3.2  3.1  3.1  3.5     Rower   Level  -  -  -  -  7   Watts  -  -  -  -  65   Minutes  -  -  -  -  10   METs  -  -  -  -  6     Track   Laps  11  11  16  16  14    Minutes  10  10  10  10  10    METs  2.91  2.91  3.78  3.78  3.44     Home Exercise Plan   Plans to continue exercise at  -  Home (comment) [facility]  [facility] (comment) [facility]'s [facility] is [facility]  Home (comment) [facility]'s goal is to [facility].  Home (comment) [facility]'s [facility] is [facility].   Frequency  -  Add 2 additional days to program exercise sessions.  Add 2 additional days to program exercise sessions.  Add 2 additional days to program exercise sessions.  Add 2 additional days to program exercise sessions.   Initial Home Exercises Provided  -  03/25/18  03/25/18  03/25/18  03/25/18   Row Name 05/20/18 0952             Response to Exercise   Blood Pressure (Admit)  96/68       Blood Pressure (Exercise)  122/70       Blood Pressure (Exit)  98/70       Heart Rate (Admit)  91 bpm       Heart Rate (Exercise)  130 bpm       Heart Rate (Exit)  89 bpm       Rating of Perceived Exertion (Exercise)  15       Symptoms  none       Duration  Progress to 30 minutes of  aerobic without signs/symptoms of physical distress       Intensity  THRR unchanged         Progression   Progression  Continue to progress workloads to maintain intensity without signs/symptoms of physical distress.       Average METs  4.6         Resistance Training   Training Prescription  Yes       Weight  6lbs       Reps  10-15       Time  10 Minutes         Interval Training    Interval Training  No         NuStep   Level  7       SPM  85       Minutes  10       METs  4.1         Rower   Level  8       Watts  64       Minutes  10       METs  6         Track   Laps  16       Minutes  10       METs  3.78         Home Exercise Plan   Plans to continue exercise at  Home (comment) [facility]       Frequency  Add 2 additional days to program exercise sessions.       Initial Home Exercises Provided  03/25/18          Exercise Comments: Exercise Comments  Row Name 03/18/18 1610 03/25/18 1106 04/10/18 1032 04/24/18 1100 05/08/18 1031   Exercise Comments  Patient tolerated exercise well without c/o.  Reviewed home exercise guidelines, METs, and goals with patient.  Reviewed METs with patient.  Reviewed METs and goals with patient.  Reviewed METs with patient.   Row Name 05/22/18 1015           Exercise Comments  Reviewed METs and goals with patient.          Exercise Goals and Review: Exercise Goals    Row Name 03/12/18 1155             Exercise Goals   Increase Physical Activity  Yes       Intervention  Provide advice, education, support and counseling about physical activity/exercise needs.;Develop an individualized exercise prescription for aerobic and resistive training based on initial evaluation findings, risk stratification, comorbidities and participant's personal goals.       Expected Outcomes  Short Term: Attend rehab on a regular basis to increase amount of physical activity.;Long Term: Add in home exercise to make exercise part of routine and to increase amount of physical activity.;Long Term: Exercising regularly at least 3-5 days a week.       Increase Strength and Stamina  Yes       Intervention  Provide advice, education, support and counseling about physical activity/exercise needs.;Develop an individualized exercise prescription for aerobic and resistive training based on initial evaluation  findings, risk stratification, comorbidities and participant's personal goals.       Expected Outcomes  Short Term: Increase workloads from initial exercise prescription for resistance, speed, and METs.;Long Term: Improve cardiorespiratory fitness, muscular endurance and strength as measured by increased METs and functional capacity ( );Short Term: Perform resistance training exercises routinely during rehab and add in resistance training at home       Able to understand and use rate of perceived exertion (RPE) scale  Yes       Intervention  Provide education and explanation on how to use RPE scale       Expected Outcomes  Short Term: Able to use RPE daily in rehab to express subjective intensity level;Long Term:  Able to use RPE to guide intensity level when exercising independently       Knowledge and understanding of Target Heart Rate Range (THRR)  Yes       Intervention  Provide education and explanation of THRR including how the numbers were predicted and where they are located for reference       Expected Outcomes  Short Term: Able to state/look up THRR;Short Term: Able to use daily as guideline for intensity in rehab;Long Term: Able to use THRR to govern intensity when exercising independently       Able to check pulse independently  Yes       Intervention  Review the importance of being able to check your own pulse for safety during independent exercise;Provide education and demonstration on how to check pulse in carotid and radial arteries.       Expected Outcomes  Short Term: Able to explain why pulse checking is important during independent exercise;Long Term: Able to check pulse independently and accurately       Understanding of Exercise Prescription  Yes       Intervention  Provide education, explanation, and written materials on patient's individual exercise prescription       Expected Outcomes  Short Term: Able to explain program exercise prescription;Long Term: Able to explain  home  exercise prescription to exercise independently          Exercise Goals Re-Evaluation : Exercise Goals Re-Evaluation    Row Name 03/18/18 0955 03/25/18 1106 04/24/18 1100 05/22/18 1015       Exercise Goal Re-Evaluation   Exercise Goals Review  Able to understand and use rate of perceived exertion (RPE) scale  Able to understand and use rate of perceived exertion (RPE) scale;Understanding of Exercise Prescription;Knowledge and understanding of Target Heart Rate Range (THRR)  Able to understand and use rate of perceived exertion (RPE) scale;Understanding of Exercise Prescription;Knowledge and understanding of Target Heart Rate Range (THRR);Increase Physical Activity;Able to check pulse independently  Able to understand and use rate of perceived exertion (RPE) scale;Understanding of Exercise Prescription;Knowledge and understanding of Target Heart Rate Range (THRR);Increase Physical Activity;Able to check pulse independently;Increase Strength and Stamina    Comments  Patient able to understand and use RPE scale appropriately.  Reviewed home exercise guidelines with patient including THRR, RPE scale, and endpoints for exercise. [facility], and [facility]. Pt is also walking and using a balance board as his mode of aerobic exercise. Pt will begin using the aerbic equipment at the [facility] before resuming classes.  Patient has a pedometer and monitors his steps each day. [facility]. Encouraged [facility] to start at [facility] 1-2 days/week while he's still attending [facility] in case of any questions, and pt is amenable to this. Reviewed THRR. Pt know how to manually count his pulse.  Patient states he's exercised at the gym twice and did well, exercising 30 minutes. Pt states he feels he's getting stronger. Patient is progressing well with exercise.    Expected Outcomes  Increase workloads as  tolerated to help improve cardiorespiratory fitness and improve stamina.  Patient will exercise 30 minutes at least 2 days in addition to exercise at [facility] to help improve cardiorespiratory fitness and increase strength and stamina.  Patient will exercise 30 minutes on Tuesdays and/or Thursdays [facility].  Continue current exercise routine to help improve energy, stamina, and strength.        Discharge Exercise Prescription (Final Exercise Prescription Changes): Exercise Prescription Changes - 05/20/18 0952      Response to Exercise   Blood Pressure (Admit)  96/68    Blood Pressure (Exercise)  122/70    Blood Pressure (Exit)  98/70    Heart Rate (Admit)  91 bpm    Heart Rate (Exercise)  130 bpm    Heart Rate (Exit)  89 bpm    Rating of Perceived Exertion (Exercise)  15    Symptoms  none    Duration  Progress to 30 minutes of  aerobic without signs/symptoms of physical distress    Intensity  THRR unchanged      Progression   Progression  Continue to progress workloads to maintain intensity without signs/symptoms of physical distress.    Average METs  4.6      Resistance Training   Training Prescription  Yes    Weight  6lbs    Reps  10-15    Time  10 Minutes      Interval Training   Interval Training  No      NuStep   Level  7    SPM  85    Minutes  10    METs  4.1  Rower   Level  8    Watts  64    Minutes  10    METs  6      Track   Laps  16    Minutes  10    METs  3.78      Home Exercise Plan   Plans to continue exercise at  Home (comment)   [facility]'s [facility]   Frequency  Add 2 additional days to program exercise sessions.    Initial Home Exercises Provided  03/25/18       Nutrition:  Target Goals: Understanding of nutrition guidelines, daily intake of sodium 1500mg , cholesterol 200mg , calories 30% from fat and 7% or less from saturated fats, daily to have 5 or more servings of fruits and vegetables.  Biometrics: Pre  Biometrics - 03/12/18 1155      Pre Biometrics   Height  5\' 9"  (1.753 m)    Weight  86.9 kg    Waist Circumference  38.5 inches    Hip Circumference  38.5 inches    Waist to Hip Ratio  1 %    BMI (Calculated)  28.28    Triceps Skinfold  20 mm    % Body Fat  27.5 %    Grip Strength  47 kg    Flexibility  12 in    Single Leg Stand  30 seconds        Nutrition Therapy Plan and Nutrition Goals: Nutrition Therapy & Goals - 04/22/18 1057      Nutrition Therapy   Diet  heart healthy, carb modified       Personal Nutrition Goals   Nutrition Goal  Food-and nutrition-related knowledge deficit related to lack of exposure to information as related to diagnosis of: ? CVD ? Pre-diabetes      Intervention Plan   Intervention  Prescribe, educate and counsel regarding individualized specific dietary modifications aiming towards targeted core components such as weight, hypertension, lipid management, diabetes, heart failure and other comorbidities.    Expected Outcomes  Short Term Goal: Understand basic principles of dietary content, such as calories, fat, sodium, cholesterol and nutrients.;Long Term Goal: Adherence to prescribed nutrition plan.       Nutrition Assessments: Nutrition Assessments - 04/22/18 1102      MEDFICTS Scores   Pre Score  18       Nutrition Goals Re-Evaluation:   Nutrition Goals Discharge (Final Nutrition Goals Re-Evaluation):   Psychosocial: Target Goals: Acknowledge presence or absence of significant depression and/or stress, maximize coping skills, provide positive support system. Participant is able to verbalize types and ability to use techniques and skills needed for reducing stress and depression.  Initial Review & Psychosocial Screening: Initial Psych Review & Screening - 03/12/18 1508      Initial Review   Current issues with  (S) Current Anxiety/Panic   health related anxiety     Family Dynamics   Good Support System?  Yes   spouse      Barriers   Psychosocial barriers to participate in program  The patient should benefit from training in stress management and relaxation.      Screening Interventions   Interventions  Encouraged to exercise;Program counselor consult;To provide support and resources with identified psychosocial needs;Provide feedback about the scores to participant    Expected Outcomes  Short Term goal: Utilizing psychosocial counselor, staff and physician to assist with identification of specific Stressors or current issues interfering with healing process.  Setting desired goal for each stressor or current issue identified.;Short Term goal: Identification and review with participant of any Quality of Life or Depression concerns found by scoring the questionnaire.       Quality of Life Scores: Quality of Life - 03/12/18 1256      Quality of Life   Select  Quality of Life      Quality of Life Scores   Health/Function Pre  15.67 %    Socioeconomic Pre  9.79 %    Psych/Spiritual Pre  15.93 %    Family Pre  22.8 %    GLOBAL Pre  15.56 %      Scores of 19 and below usually indicate a poorer quality of life in these areas.  A difference of  2-3 points is a clinically meaningful difference.  A difference of 2-3 points in the total score of the Quality of Life Index has been associated with significant improvement in overall quality of life, self-image, physical symptoms, and general health in studies assessing change in quality of life.  PHQ-9: Recent Review Flowsheet Data    Depression screen [facility] 2/9 03/18/2018 08/13/2017   Decreased Interest 0 0   Down, Depressed, Hopeless 0 0   PHQ - 2 Score 0 0     Interpretation of Total Score  Total Score Depression Severity:  1-4 = Minimal depression, 5-9 = Mild depression, 10-14 = Moderate depression, 15-19 = Moderately severe depression, 20-27 = Severe depression   Psychosocial Evaluation and Intervention:   Psychosocial Re-Evaluation: Psychosocial  Re-Evaluation    Row Name 04/04/18 1645 05/02/18 1332 05/30/18 1212         Psychosocial Re-Evaluation   Current issues with  Current Anxiety/Panic  Current Anxiety/Panic  Current Anxiety/Panic     Comments  [name] has not voiced any increased stressors so far  [name] has not voiced any increased stressors so far  [name] has not voiced any increased stressors      Expected Outcomes  Will assess and offer support as needed  Will assess and offer support as needed  Will assess and offer support as needed     Interventions  Stress management education;Encouraged to attend Cardiac Rehabilitation for the exercise  Stress management education;Encouraged to attend Cardiac Rehabilitation for the exercise  Stress management education;Encouraged to attend Cardiac Rehabilitation for the exercise     Continue Psychosocial Services   Follow up required by staff  Follow up required by staff  No Follow up required        Psychosocial Discharge (Final Psychosocial Re-Evaluation): Psychosocial Re-Evaluation - 05/30/18 1212      Psychosocial Re-Evaluation   Current issues with  Current Anxiety/Panic    Comments  [name] has not voiced any increased stressors     Expected Outcomes  Will assess and offer support as needed    Interventions  Stress management education;Encouraged to attend Cardiac Rehabilitation for the exercise    Continue Psychosocial Services   No Follow up required       Vocational Rehabilitation: Provide vocational rehab assistance to qualifying candidates.   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 Participant will state understanding/return demonstration of topics  presented.  Learning Barriers/Preferences: Learning Barriers/Preferences - 03/12/18 1250      Learning Barriers/Preferences   Learning Barriers  Exercise Concerns   recovering from stroke, L-sided weakness   Learning Preferences  Skilled Demonstration       Education Topics: Hypertension, Hypertension Reduction -Define heart disease and high blood pressure. Discus how high blood pressure affects the body and ways to reduce high blood pressure.   Exercise and Your Heart -Discuss why it is important to exercise, the FITT principles of exercise, normal and abnormal responses to exercise, and how to exercise safely.   Angina -Discuss definition of angina, causes of angina, treatment of angina, and how to decrease risk of having angina.   Cardiac Medications -Review what the following cardiac medications are used for, how they affect the body, and side effects that may occur when taking the medications.  Medications include Aspirin, Beta blockers, calcium channel blockers, ACE Inhibitors, angiotensin receptor blockers, diuretics, digoxin, and antihyperlipidemics.   Congestive Heart Failure -Discuss the definition of CHF, how to live with CHF, the signs and symptoms of CHF, and how keep track of weight and sodium intake.   Heart Disease and Intimacy -Discus the effect sexual activity has on the heart, how changes occur during intimacy as we age, and safety during sexual activity.   Smoking Cessation / COPD -Discuss different methods to quit smoking, the health benefits of quitting smoking, and the definition of COPD.   Nutrition I: Fats -Discuss the types of cholesterol, what cholesterol does to the heart, and how cholesterol levels can be controlled.   Nutrition II: Labels -Discuss the different components of food labels and how to read food label   Heart Parts/Heart Disease and PAD -Discuss the anatomy of the heart, the pathway of blood circulation through the heart, and  these are affected by heart disease.   Stress I: Signs and Symptoms -Discuss the causes of stress, how stress may lead to anxiety and depression, and ways to limit stress.   Stress II: Relaxation -Discuss different types of relaxation techniques to limit stress.   Warning Signs of Stroke / TIA -Discuss definition of a stroke, what the signs and symptoms are of a stroke, and how to identify when someone is having stroke.   Knowledge Questionnaire Score: Knowledge Questionnaire Score - 03/12/18 1251      Knowledge Questionnaire Score   Pre Score  23/28       Core Components/Risk Factors/Patient Goals at Admission: Personal Goals and Risk Factors at Admission - 03/12/18 1300      Core Components/Risk Factors/Patient Goals on Admission    Weight Management  Yes;Weight Maintenance    Intervention  Weight Management: Provide education and appropriate resources to help participant work on and attain dietary goals.;Weight Management: Develop a combined nutrition and exercise program designed to reach desired caloric intake, while maintaining appropriate intake of nutrient and fiber, sodium and fats, and appropriate energy expenditure required for the weight goal.    Expected Outcomes  Short Term: Continue to assess and modify interventions until short term weight is achieved;Long Term: Adherence to nutrition and physical activity/exercise program aimed toward attainment of established weight goal;Weight Maintenance: Understanding of the daily nutrition guidelines, which includes 25-35% calories from fat, 7% or less cal from saturated fats, less than 200mg  cholesterol, less than 1.5gm of sodium, & 5 or more servings of fruits and vegetables daily;Understanding recommendations for meals to include 15-35% energy as protein, 25-35% energy from fat, 35-60% energy from carbohydrates, less  than 200mg  of dietary cholesterol, 20-35 gm of total fiber daily;Understanding of distribution of calorie intake  throughout the day with the consumption of 4-5 meals/snacks    Lipids  Yes    Intervention  Provide education and support for participant on nutrition & aerobic/resistive exercise along with prescribed medications to achieve LDL 70mg , HDL >40mg .    Expected Outcomes  Short Term: Participant states understanding of desired cholesterol values and is compliant with medications prescribed. Participant is following exercise prescription and nutrition guidelines.;Long Term: Cholesterol controlled with medications as prescribed, with individualized exercise RX and with personalized nutrition plan. Value goals: LDL < 70mg , HDL > 40 mg.       Core Components/Risk Factors/Patient Goals Review:  Goals and Risk Factor Review    Row Name 04/04/18 1647 05/02/18 1333 05/30/18 1212         Core Components/Risk Factors/Patient Goals Review   Personal Goals Review  Weight Management/Obesity;Lipids  Weight Management/Obesity;Lipids  Weight Management/Obesity;Lipids     Review  [name]'s vital signs have been stable. [name] has been doing well with exercise.  [name]'s vital signs have been stable. [name] has been doing well with exercise.  [name]'s vital signs have been stable. [name] has been doing well with exercise.     Expected Outcomes  [name] will continue participate in phase 2 cardiac rehab. Follow exercise, nutrition and lifestyle modification opportunities.  [name] will continue participate in phase 2 cardiac rehab. Follow exercise, nutrition and lifestyle modification opportunities.  [name] will continue participate in phase 2 cardiac rehab. Follow exercise, nutrition and lifestyle modification opportunities.        Core Components/Risk Factors/Patient Goals at Discharge (Final Review):  Goals and Risk Factor Review - 05/30/18 1212      Core Components/Risk Factors/Patient Goals Review   Personal Goals Review  Weight Management/Obesity;Lipids    Review  [name]'s vital signs have been stable. [name] has been doing well with exercise.     Expected Outcomes  [name] will continue participate in phase 2 cardiac rehab. Follow exercise, nutrition and lifestyle modification opportunities.       ITP Comments: ITP Comments    Row Name 03/12/18 0935 04/04/18 1642 05/02/18 1332 05/30/18 1211     ITP Comments  Dr. Armanda Magic, Medical Director  30 Day ITP Review. Patient with good attendance and participation in phase 2 cardiac rehab  30 Day ITP Review. Patient with good attendance and participation in phase 2 cardiac rehab  30 Day ITP Review. Patient with good attendance and participation in phase 2 cardiac rehab       Comments: See ITP comments. [name]'s systolic blood pressures have been in the 90's at time. Dr Gala Romney is aware patient remains asymptomatic. Will continue to monitor the patient throughout  the program. Gladstone Lighter, RN,BSN 05/30/2018 12:14 PM

## 2018-05-31 ENCOUNTER — Encounter (HOSPITAL_COMMUNITY): Payer: Managed Care, Other (non HMO)

## 2018-05-31 ENCOUNTER — Encounter (HOSPITAL_COMMUNITY)
Admission: RE | Admit: 2018-05-31 | Discharge: 2018-05-31 | Disposition: A | Discharge status: Home / Self Care | Payer: Managed Care, Other (non HMO) | Source: Ambulatory Visit | Attending: Internal Medicine | Admitting: Internal Medicine

## 2018-05-31 DIAGNOSIS — Z7901 Long term (current) use of anticoagulants: Secondary | ICD-10-CM | POA: Insufficient documentation

## 2018-05-31 DIAGNOSIS — Z8673 Personal history of transient ischemic attack (TIA), and cerebral infarction without residual deficits: Secondary | ICD-10-CM | POA: Diagnosis not present

## 2018-05-31 DIAGNOSIS — F329 Major depressive disorder, single episode, unspecified: Secondary | ICD-10-CM | POA: Diagnosis not present

## 2018-05-31 DIAGNOSIS — F909 Attention-deficit hyperactivity disorder, unspecified type: Secondary | ICD-10-CM | POA: Diagnosis not present

## 2018-05-31 DIAGNOSIS — I5022 Chronic systolic (congestive) heart failure: Secondary | ICD-10-CM | POA: Insufficient documentation

## 2018-05-31 DIAGNOSIS — F419 Anxiety disorder, unspecified: Secondary | ICD-10-CM | POA: Insufficient documentation

## 2018-05-31 DIAGNOSIS — Z79899 Other long term (current) drug therapy: Secondary | ICD-10-CM | POA: Diagnosis not present

## 2018-05-31 DIAGNOSIS — Z87891 Personal history of nicotine dependence: Secondary | ICD-10-CM | POA: Insufficient documentation

## 2018-05-31 DIAGNOSIS — Z87442 Personal history of urinary calculi: Secondary | ICD-10-CM | POA: Insufficient documentation

## 2018-06-03 ENCOUNTER — Encounter (HOSPITAL_COMMUNITY): Payer: Managed Care, Other (non HMO)

## 2018-06-03 ENCOUNTER — Encounter (HOSPITAL_COMMUNITY)
Admission: RE | Admit: 2018-06-03 | Discharge: 2018-06-03 | Disposition: A | Discharge status: Home / Self Care | Payer: Managed Care, Other (non HMO) | Source: Ambulatory Visit | Attending: Internal Medicine | Admitting: Internal Medicine

## 2018-06-03 DIAGNOSIS — I5022 Chronic systolic (congestive) heart failure: Secondary | ICD-10-CM

## 2018-06-05 ENCOUNTER — Encounter (HOSPITAL_COMMUNITY)
Admission: RE | Admit: 2018-06-05 | Discharge: 2018-06-05 | Disposition: A | Discharge status: Home / Self Care | Payer: Managed Care, Other (non HMO) | Source: Ambulatory Visit | Attending: Internal Medicine | Admitting: Internal Medicine

## 2018-06-05 ENCOUNTER — Telehealth (HOSPITAL_COMMUNITY): Payer: Self-pay | Admitting: Vascular Surgery

## 2018-06-05 ENCOUNTER — Encounter (HOSPITAL_COMMUNITY): Payer: Managed Care, Other (non HMO)

## 2018-06-05 DIAGNOSIS — I5022 Chronic systolic (congestive) heart failure: Secondary | ICD-10-CM | POA: Diagnosis not present

## 2018-06-05 NOTE — Telephone Encounter (Signed)
Left pt message to resh 11/22 app w/ db , db will not be in office

## 2018-06-07 ENCOUNTER — Encounter (HOSPITAL_COMMUNITY)
Admission: RE | Admit: 2018-06-07 | Discharge: 2018-06-07 | Disposition: A | Discharge status: Home / Self Care | Payer: Managed Care, Other (non HMO) | Source: Ambulatory Visit | Attending: Internal Medicine | Admitting: Internal Medicine

## 2018-06-07 ENCOUNTER — Encounter (HOSPITAL_COMMUNITY): Payer: Managed Care, Other (non HMO)

## 2018-06-07 DIAGNOSIS — I5022 Chronic systolic (congestive) heart failure: Secondary | ICD-10-CM | POA: Diagnosis not present

## 2018-06-10 ENCOUNTER — Encounter (HOSPITAL_COMMUNITY): Payer: Managed Care, Other (non HMO)

## 2018-06-10 ENCOUNTER — Encounter (HOSPITAL_COMMUNITY)
Admission: RE | Admit: 2018-06-10 | Discharge: 2018-06-10 | Disposition: A | Discharge status: Home / Self Care | Payer: Managed Care, Other (non HMO) | Source: Ambulatory Visit | Attending: Internal Medicine | Admitting: Internal Medicine

## 2018-06-10 DIAGNOSIS — I5022 Chronic systolic (congestive) heart failure: Secondary | ICD-10-CM | POA: Diagnosis not present

## 2018-06-12 ENCOUNTER — Encounter (HOSPITAL_COMMUNITY)
Admission: RE | Admit: 2018-06-12 | Discharge: 2018-06-12 | Disposition: A | Discharge status: Home / Self Care | Payer: Managed Care, Other (non HMO) | Source: Ambulatory Visit | Attending: Internal Medicine | Admitting: Internal Medicine

## 2018-06-12 ENCOUNTER — Encounter (HOSPITAL_COMMUNITY): Payer: Managed Care, Other (non HMO)

## 2018-06-12 VITALS — BP 112/60 | HR 92 | Ht 69.0 in | Wt 192.2 lb

## 2018-06-12 DIAGNOSIS — I5022 Chronic systolic (congestive) heart failure: Secondary | ICD-10-CM

## 2018-06-12 NOTE — Progress Notes (Signed)
Discharge Progress Report  Patient Details  Name: Angel Davies MRN: [id number] Date of Birth: 02-15-1971 Referring Provider:     CARDIAC REHAB PHASE II ORIENTATION from 03/12/2018 in [place]  Referring Provider  Larita Fife MD       Number of Visits: 36  Reason for Discharge:  Patient reached a stable level of exercise. Patient independent in their exercise. Patient has met program and personal goals.  Smoking History:  Social History   Tobacco Use  Smoking Status Former Smoker  . Packs/day: 0.00  . Years: 0.00  . Pack years: 0.00  Smokeless Tobacco Never Used    Diagnosis:  Heart failure, chronic systolic (HCC)  ADL [facility]SD:   Initial Exercise Prescription: Initial Exercise Prescription - 03/12/18 1100      Date of Initial Exercise RX and Referring Provider   Date  03/12/18    Referring Provider  Larita Fife MD      Recumbant Bike   Level  2    Minutes  10    METs  2      NuStep   Level  3    SPM  80    Minutes  10    METs  2      Track   Laps  10    Minutes  10    METs  2.74      Prescription Details   Frequency (times per week)  3    Duration  Progress to 30 minutes of continuous aerobic without signs/symptoms of physical distress      Intensity   THRR 40-80% of Max Heartrate  69-138    Ratings of Perceived Exertion  11-13    Perceived Dyspnea  0-4      Progression   Progression  Continue to progress workloads to maintain intensity without signs/symptoms of physical distress.      Resistance Training   Training Prescription  Yes    Weight  4lbs    Reps  10-15       Discharge Exercise Prescription (Final Exercise Prescription Changes): Exercise Prescription Changes - 06/12/18 0957      Response to Exercise   Blood Pressure (Admit)  112/60    Blood Pressure (Exercise)  132/80    Blood Pressure (Exit)  100/60    Heart Rate (Admit)  92 bpm    Heart Rate (Exercise)  134 bpm    Heart Rate  (Exit)  96 bpm    Rating of Perceived Exertion (Exercise)  15    Symptoms  none    Duration  Progress to 30 minutes of  aerobic without signs/symptoms of physical distress    Intensity  THRR unchanged      Progression   Progression  Continue to progress workloads to maintain intensity without signs/symptoms of physical distress.    Average METs  4.6      Resistance Training   Training Prescription  No   Relaxation day, no weights.   Weight  --    Reps  --    Time  --      Interval Training   Interval Training  No      NuStep   Level  7    SPM  85    Minutes  10    METs  4.3      Rower   Level  8    Watts  83    Minutes  10    METs  6.7      Track   Laps  11    Minutes  10    METs  2.92      Home Exercise Plan   Plans to continue exercise at  Home (comment)   [facility]   Frequency  Add 2 additional days to program exercise sessions.    Initial Home Exercises Provided  03/25/18       Functional Capacity: 6 Minute Walk    Row Name 03/12/18 1149 05/31/18 1003       6 Minute Walk   Phase  Initial  Discharge    Distance  1426 feet  2046 feet    Distance % Change  -  43.48 %    Distance Feet Change  -  620 ft    Walk Time  5.34 minutes  6 minutes    # of Rest Breaks  0  0    MPH  3  3.88    METS  4.1  5.42    RPE  13  13    Perceived Dyspnea   -  0    VO2 Peak  14.5  18.98    Symptoms  Yes (comment)  Yes (comment)    Comments  fatigue! Stopped test at 5:34; c/o B shin discomfort/ resolved with rest  Shins sore at end of test.    Resting HR  81 bpm  87 bpm    Resting BP  100/60  114/70    Resting Oxygen Saturation   97 %  -    Exercise Oxygen Saturation  during 6 min walk  98 %  -    Max Ex. HR  101 bpm  111 bpm    Max Ex. BP  108/70  122/82    2 Minute Post BP  97/69  117/57       Psychological, QOL, Others - Outcomes: PHQ 2/9: Depression screen [facility] 2/9 06/12/2018 03/18/2018 08/13/2017  Decreased Interest 0 0 0   Down, Depressed, Hopeless 0 0 0  PHQ - 2 Score 0 0 0    Quality of Life: Quality of Life - 06/11/18 1130      Quality of Life   Select  Quality of Life      Quality of Life Scores   Health/Function Pre  15.67 %    Health/Function Post  21.93 %    Health/Function % Change  39.95 %    Socioeconomic Pre  9.79 %    Socioeconomic Post  11.64 %    Socioeconomic % Change   18.9 %    Psych/Spiritual Pre  15.93 %    Psych/Spiritual Post  12.86 %    Psych/Spiritual % Change  -19.27 %    Family Pre  22.8 %    Family Post  26.38 %    Family % Change  15.7 %    GLOBAL Pre  15.56 %    GLOBAL Post  18.36 %    GLOBAL % Change  17.99 %       Personal Goals: Goals established at orientation with interventions provided to work toward goal. Personal Goals and Risk Factors at Admission - 03/12/18 1300      Core Components/Risk Factors/Patient Goals on Admission    Weight Management  Yes;Weight Maintenance    Intervention  Weight Management: Provide education and appropriate resources to help participant work on and attain dietary goals.;Weight Management: Develop a combined nutrition and  exercise program designed to reach desired caloric intake, while maintaining appropriate intake of nutrient and fiber, sodium and fats, and appropriate energy expenditure required for the weight goal.    Expected Outcomes  Short Term: Continue to assess and modify interventions until short term weight is achieved;Long Term: Adherence to nutrition and physical activity/exercise program aimed toward attainment of established weight goal;Weight Maintenance: Understanding of the daily nutrition guidelines, which includes 25-35% calories from fat, 7% or less cal from saturated fats, less than 222m cholesterol, less than 1.5gm of sodium, & 5 or more servings of fruits and vegetables daily;Understanding recommendations for meals to include 15-35% energy as protein, 25-35% energy from fat, 35-60% energy from carbohydrates,  less than 2031mof dietary cholesterol, 20-35 gm of total fiber daily;Understanding of distribution of calorie intake throughout the day with the consumption of 4-5 meals/snacks    Lipids  Yes    Intervention  Provide education and support for participant on nutrition & aerobic/resistive exercise along with prescribed medications to achieve LDL <7045mHDL >42m31m  Expected Outcomes  Short Term: Participant states understanding of desired cholesterol values and is compliant with medications prescribed. Participant is following exercise prescription and nutrition guidelines.;Long Term: Cholesterol controlled with medications as prescribed, with individualized exercise RX and with personalized nutrition plan. Value goals: LDL < 70mg6mL > 40 mg.        Personal Goals Discharge: Goals and Risk Factor Review    Row Name 04/04/18 1647 05/02/18 1333 05/30/18 1212 06/12/18 0959       Core Components/Risk Factors/Patient Goals Review   Personal Goals Review  Weight Management/Obesity;Lipids  Weight Management/Obesity;Lipids  Weight Management/Obesity;Lipids  Weight Management/Obesity;Lipids    Review  Angel's vital signs have been stable. Angel hClair Gullingbeen doing well with exercise.  Angel's vital signs have been stable. Angel hClair Gullingbeen doing well with exercise.  Angel's vital signs have been stable. Angel hClair Gullingbeen doing well with exercise.  Angel's vital signs have been stable. Angel hClair Gullingbeen doing well with exercise.    Expected Outcomes  Angel wClair Davies continue participate in phase 2 cardiac rehab. Follow exercise, nutrition and lifestyle modification opportunities.  Angel wClair Davies continue participate in phase 2 cardiac rehab. Follow exercise, nutrition and lifestyle modification opportunities.  Angel wClair Davies continue participate in phase 2 cardiac rehab. Follow exercise, nutrition and lifestyle modification opportunities.  [facility] and lifestyle [facility].        Exercise Goals and Review: Exercise Goals    Row Name 03/12/18 1155             Exercise Goals   Increase Physical Activity  Yes       Intervention  Provide advice, education, support and counseling about physical activity/exercise needs.;Develop an individualized exercise prescription for aerobic and resistive training based on initial evaluation findings, risk stratification, comorbidities and participant's personal goals.       Expected Outcomes  Short Term: Attend rehab on a regular basis to increase amount of physical activity.;Long Term: Add in home exercise to make exercise part of routine and to increase amount of physical activity.;Long Term: Exercising regularly at least 3-5 days a week.       Increase Strength and Stamina  Yes       Intervention  Provide advice, education, support and counseling about physical activity/exercise needs.;Develop an individualized exercise prescription for aerobic and resistive training based on initial evaluation findings, risk stratification, comorbidities and participant's personal goals.  Expected Outcomes  Short Term: Increase workloads from initial exercise prescription for resistance, speed, and METs.;Long Term: Improve cardiorespiratory fitness, muscular endurance and strength as measured by increased METs and functional capacity (6MWT);Short Term: Perform resistance training exercises routinely during rehab and add in resistance training at home       Able to understand and use rate of perceived exertion (RPE) scale  Yes       Intervention  Provide education and explanation on how to use RPE scale       Expected Outcomes  Short Term: Able to use RPE daily in rehab to express subjective intensity level;Long Term:  Able to use RPE to guide intensity level when exercising independently       Knowledge and understanding of Target Heart Rate Range (THRR)  Yes       Intervention  Provide education and explanation of THRR including how the  numbers were predicted and where they are located for reference       Expected Outcomes  Short Term: Able to state/look up THRR;Short Term: Able to use daily as guideline for intensity in rehab;Long Term: Able to use THRR to govern intensity when exercising independently       Able to check pulse independently  Yes       Intervention  Review the importance of being able to check your own pulse for safety during independent exercise;Provide education and demonstration on how to check pulse in carotid and radial arteries.       Expected Outcomes  Short Term: Able to explain why pulse checking is important during independent exercise;Long Term: Able to check pulse independently and accurately       Understanding of Exercise Prescription  Yes       Intervention  Provide education, explanation, and written materials on patient's individual exercise prescription       Expected Outcomes  Short Term: Able to explain program exercise prescription;Long Term: Able to explain home exercise prescription to exercise independently          Exercise Goals Re-Evaluation: Exercise Goals Re-Evaluation    Row Name 03/18/18 0955 03/25/18 1106 04/24/18 1100 05/22/18 1015 06/10/18 1010     Exercise Goal Re-Evaluation   Exercise Goals Review  Able to understand and use rate of perceived exertion (RPE) scale  Able to understand and use rate of perceived exertion (RPE) scale;Understanding of Exercise Prescription;Knowledge and understanding of Target Heart Rate Range (THRR)  Able to understand and use rate of perceived exertion (RPE) scale;Understanding of Exercise Prescription;Knowledge and understanding of Target Heart Rate Range (THRR);Increase Physical Activity;Able to check pulse independently  Able to understand and use rate of perceived exertion (RPE) scale;Understanding of Exercise Prescription;Knowledge and understanding of Target Heart Rate Range (THRR);Increase Physical Activity;Able to check pulse  independently;Increase Strength and Stamina  Able to understand and use rate of perceived exertion (RPE) scale;Understanding of Exercise Prescription;Knowledge and understanding of Target Heart Rate Range (THRR);Increase Physical Activity;Able to check pulse independently;Increase Strength and Stamina   Comments  Patient able to understand and use RPE scale appropriately.  Reviewed home exercise guidelines with patient including THRR, RPE scale, and endpoints for exercise. [facility] Pt is also walking and using a balance board as his mode of aerobic exercise. Pt will begin using the aerbic [facility] before resuming classes.  Patient has a pedometer and monitors his steps each day. [facility]  [facility] [place]. Encouraged patient to start at [facility] 1-[facility] days/week while he's still attending [facility] in case of any questions, and pt is amenable to this. Reviewed THRR. Pt know how to manually count his pulse.  Patient states he's exercised at the gym twice and did well, exercising 30 minutes. Pt states he feels he's getting stronger. Patient is progressing well with exercise.  [facility], in addtition to walking and using equipment at home upon completion of the cardiac rehab program.   Expected Outcomes  Increase workloads as tolerated to help improve cardiorespiratory fitness and improve stamina.  Patient will exercise 30 minutes at least 2 days in addition to exercise at [facility] to help improve cardiorespiratory fitness and increase strength and stamina.  Patient will exercise 30 minutes on Tuesdays and/or Thursdays at [facility].  Continue current exercise routine to help improve energy, stamina, and strength.  Patient will exericse 30-60 minutes at least 5 days week, walking, using equipment at home, and exercising at the [facility].   [place] Name 06/25/18 1541              Exercise Goal Re-Evaluation   Exercise Goals Review  Able to understand and use rate of perceived exertion (RPE) scale;Understanding of Exercise Prescription;Knowledge and understanding of Target Heart Rate Range (THRR);Increase Physical Activity;Able to check pulse independently;Increase Strength and Stamina       Comments  Patient completed the phase 2 cardiac rehab program on 06/12/18 and progressed well achieving 4.6 METs with exercise. Pt was very complementary of the program and felt that he benefitted greatly from participation       Expected Outcomes  Patient will exercise at least 5 days/[facility] and and walking at home to maintain health and fitness gains.          Nutrition & Weight - Outcomes: Pre Biometrics - 03/12/18 1155      Pre Biometrics   Height  '5\' 9"'$  (1.753 m)    Weight  191 lb 9.3 oz (86.9 kg)    Waist Circumference  38.5 inches    Hip Circumference  38.5 inches    Waist to Hip Ratio  1 %    BMI (Calculated)  28.28    Triceps Skinfold  20 mm    % Body Fat  27.5 %    Grip Strength  47 kg    Flexibility  12 in    Single Leg Stand  30 seconds      Post Biometrics - 06/12/18 1046       Post  Biometrics   Height  '5\' 9"'$  (1.753 m)    Weight  192 lb 3.9 oz (87.2 kg)    Waist Circumference  37.75 inches    Hip Circumference  39 inches    Waist to Hip Ratio  0.97 %    BMI (Calculated)  28.38    Triceps Skinfold  14 mm    % Body Fat  25.7 %    Grip Strength  51 kg    Flexibility  13 in    Single Leg Stand  30 seconds       Nutrition: Nutrition Therapy & Goals - 06/10/18 0819      Nutrition Therapy   Diet  heart healthy, carb modified       Personal Nutrition Goals   Nutrition Goal  Pt to identify and limit food sources of saturated fat,  trans fat, sodium, and refined carbohydrates    Personal Goal #2  pt to be able to identify foods that affect blood glucose      Intervention Plan   Intervention  Prescribe, educate and counsel regarding  individualized specific dietary modifications aiming towards targeted core components such as weight, hypertension, lipid management, diabetes, heart failure and other comorbidities.    Expected Outcomes  Short Term Goal: Understand basic principles of dietary content, such as calories, fat, sodium, cholesterol and nutrients.;Long Term Goal: Adherence to prescribed nutrition plan.       Nutrition Discharge: Nutrition Assessments - 06/10/18 0818      MEDFICTS Scores   Pre Score  18    Post Score  18    Score Difference  0       Education Questionnaire Score: Knowledge Questionnaire Score - 06/10/18 1000      Knowledge Questionnaire Score   Pre Score  23/28    Post Score  23/24       Goals reviewed with patient; copy given to patient. Angel Davies graduated from cardiac rehab program on 06/12/18 with completion of 36 exercise sessions in Phase II. Pt maintained good attendance and progressed nicely during his participation in rehab as evidenced by increased MET level.   Medication list reconciled. Repeat  PHQ score- 0 .  Pt has made significant lifestyle changes and should be commended for his success. Pt feels he has achieved his goals during cardiac rehab.   Pt plans to continue exercise at the [facility]. Angel Davies says he feels stronger and has more energy. We are proud of Angel's progress as Angel Davies increased his distance on his post exercise walk test and maintained his current weight. We are proud of Angel's progress. 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

## 2018-06-14 ENCOUNTER — Encounter (HOSPITAL_COMMUNITY): Payer: Managed Care, Other (non HMO)

## 2018-06-17 ENCOUNTER — Encounter (HOSPITAL_COMMUNITY): Payer: Managed Care, Other (non HMO)

## 2018-06-19 ENCOUNTER — Encounter (HOSPITAL_COMMUNITY): Payer: Managed Care, Other (non HMO)

## 2018-06-21 ENCOUNTER — Encounter (HOSPITAL_COMMUNITY): Payer: Managed Care, Other (non HMO) | Admitting: Internal Medicine

## 2018-06-21 ENCOUNTER — Other Ambulatory Visit (HOSPITAL_COMMUNITY): Payer: Managed Care, Other (non HMO)

## 2018-06-26 NOTE — Progress Notes (Deleted)
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 Patient presented via private vehicle with strokelike symptoms. Reportedly went to bed, woke up, was unable to bear weight on the left leg and at that time noticed not only left leg weakness but left-sided numbness, slurred speech and a left facial droop. Patient didn't seek any medical attention till later in the afternoon on 07/02/17. Patient admits to drinking half a box of wine and using cocaine the night before presentation. Pt also uses marijuana 4 times a week Of note, pt had been seen by neurology Dr. Lucia Gaskins on 11/19 with a chief complaint of history of strokes in the family, hx of fall, last in June, which was preceded by vision loss in the left eye peripherally and was associated with headaches. An outpatient MRI of the brain with and without contrast had been planned for 07/06/17. In the ED, CT of the head revealed small bilateral occipital infarcts. Neurology was consulted. A code stroke was called but pt was not a candidate for thrombolytic therapy UPDATE 1/21/2019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  CTA head and neck confirmed high-grade right P2 stenosis.  TTE EF 20-25%.  Unable to calculate LDL,  Triglycerides 539.  Hemoglobin A1c 6.  Urine drug screen positive for cocaine and THC.  He is currently receiving occupational therapy and therapy.  He has a follow-up appointment with Dr. neuro-ophthalmologist before he can start  back driving.  He is currently on Coumadin for secondary stroke prevention without bruising and bleeding.  He says he has stopped smoking and was congratulated for that.  He also says he is no longer using cocaine.  He is on Lipitor and fenofibrate.  He denies any myalgias.  He does complain with memory loss and weakness on the left side.  He returns for reevaluation UPDATE 5/20/2019[name], 47 year old male returns for follow-up with history of acute right pontine which occurred in December 2018.  He is currently on Coumadin for  secondary stroke prevention with minimal bruising and no bleeding.  He remains on Lipitor and fenofibrate without complaints of myalgias.  All therapies have stopped and he is continuing to exercise daily by walking or recumbent bike .He has stopped smoking and denies cocaine use.  He continues to follow with cardiology.  Digoxin was stopped.  He is on Entresto for chronic systolic heart failure.  He returns for reevaluation   REVIEW OF SYSTEMS: Full 14 system review of systems performed and notable only for those listed, all others are neg:  Constitutional: neg  Cardiovascular: neg Ear/Nose/Throat: neg  Skin: neg Eyes: neg Respiratory: neg Gastroitestinal: Urinary frequency Hematology/Lymphatic: neg  Endocrine: neg Musculoskeletal: Neck stiffness Allergy/Immunology: neg Neurological: Memory loss,  Psychiatric: Decreased concentration Sleep : neg   ALLERGIES: No Known Allergies  HOME MEDICATIONS: Outpatient Medications Prior to Visit  Medication Sig Dispense Refill  . amphetamine-dextroamphetamine (ADDERALL) 20 MG tablet Take 20 mg by mouth 3 (three) times daily.    Marland Kitchen apixaban (ELIQUIS) 5 MG TABS tablet Take 5 mg by mouth 2 (  two) times daily.    Marland Kitchen atorvastatin (LIPITOR) 80 MG tablet Take 1 tablet (80 mg total) by mouth daily at 6 PM. 30 tablet 5  . carvedilol (COREG) 6.25 MG tablet Take 1 tablet (6.25 mg total) by mouth 2 (two) times daily with a meal.  60 tablet 5  . fenofibrate 160 MG tablet TAKE ONE TABLET BY MOUTH ONE TIME DAILY  30 tablet 4  . folic acid (FOLVITE) 1 MG tablet Take 1 tablet (1 mg total) by mouth daily. 30 tablet 0  . Multiple Vitamin (MULTIVITAMIN WITH MINERALS) TABS tablet Take 1 tablet by mouth daily. 100 tablet 0  . nicotine (NICODERM CQ - DOSED IN MG/24 HOURS) 14 mg/24hr patch Place 1 patch (14 mg total) onto the skin daily. 28 patch 0  . sacubitril-valsartan (ENTRESTO) 97-103 MG Take 1 tablet by mouth 2 (two) times daily. 60 tablet 6  . spironolactone (ALDACTONE) 25 MG tablet Take 0.5 tablets (12.5 mg total) by mouth at bedtime. 30 tablet 5  . thiamine 100 MG tablet Take 1 tablet (100 mg total) by mouth daily. 30 tablet 0  . traMADol (ULTRAM) 50 MG tablet Take 50 mg by mouth as needed for moderate pain (Every 4-6 hours PRN).    Marland Kitchen traZODone (DESYREL) 50 MG tablet Take 100-150 mg by mouth at bedtime as needed for sleep.     No facility-administered medications prior to visit.     PAST MEDICAL HISTORY: Past Medical History:  Diagnosis Date  . ADHD (attention deficit hyperactivity disorder)   . Anxiety   . CVA (cerebral vascular accident) (HCC) 07/02/2017   "left sided weakness" (07/02/2017)  . Depression   . History of kidney stones     PAST SURGICAL HISTORY: Past Surgical History:  Procedure Laterality Date  . CYSTOSCOPY WITH URETEROSCOPY, STONE BASKETRY AND STENT PLACEMENT    . RIGHT/LEFT HEART CATH AND CORONARY ANGIOGRAPHY N/A 07/05/2017   Procedure: RIGHT/LEFT HEART CATH AND CORONARY ANGIOGRAPHY;  Surgeon: Dolores Patty, MD;  Location: [facility];  Service: Cardiovascular;  Laterality: N/A;    FAMILY HISTORY: Family History  Problem Relation Age of Onset  . Stroke Mother   . Colon cancer Mother   . Prostate cancer Father   . Seizures Neg Hx     SOCIAL HISTORY: Social History   Socioeconomic History  . Marital status: Married    Spouse name: Not on file  . Number of children: Not  on file  . Years of education: Not on file  . Highest education level: Not on file  Occupational History  . Not on file  Social Needs  . Financial resource strain: Not on file  . Food insecurity:    Worry: Not on file    Inability: Not on file  . Transportation needs:    Medical: Not on file    Non-medical: Not on file  Tobacco Use  . Smoking status: Former Smoker    Packs/day: 0.00    Years: 0.00    Pack years: 0.00  . Smokeless tobacco: Never Used  Substance and Sexual Activity  . Alcohol use: Yes    Alcohol/week: 4.0 standard drinks    Types: 4 Glasses of wine per week  . Drug use: Yes    Types: Marijuana, "Crack" cocaine    Comment: 07/02/2017 "smokes marijuana a couple days/week" , "08/20/17 vapes marijuana juice"  . Sexual activity: Yes  Lifestyle  . Physical activity:    Days per week: Not on file  Minutes per session: Not on file  . Stress: Not on file  Relationships  . Social connections:    Talks on phone: Not on file    Gets together: Not on file    Attends religious service: Not on file    Active member of club or organization: Not on file    Attends meetings of clubs or organizations: Not on file    Relationship status: Not on file  . Intimate partner violence:    Fear of current or ex partner: Not on file    Emotionally abused: Not on file    Physically abused: Not on file    Forced sexual activity: Not on file  Other Topics Concern  . Not on file  Social History Narrative  . Not on file     PHYSICAL EXAM  There were no vitals filed for this visit. There is no height or weight on file to calculate BMI.  Generalized: Well developed, in no acute distress  Head: normocephalic and atraumatic,. Oropharynx benign  Neck: Supple, no carotid bruits  Cardiac: Regular rate rhythm, no murmur  Musculoskeletal: No deformity   Neurological examination   Mentation: Alert oriented to time, place, history taking. Attention span and concentration  appropriate.  Follows all commands speech and language fluent.   Cranial nerve II-XII: Pupils were equal round reactive to light extraocular movements were full, vision loss  left upper and lower quadrant, mild left facial droop, hearing  was intact to finger rubbing bilaterally. Uvula tongue midline. head turning and shoulder shrug were normal and symmetric.Tongue protrusion into cheek strength was normal. Motor: normal bulk and tone, full strength in the BUE, BLE,  Sensory: normal and symmetric to light touch, pinprick, and  Vibration in the upper and lower extremities  Coordination: finger-nose-finger, heel-to-shin bilaterally, no dysmetria Reflexes: 3+ on the left, 2+ on the right, plantar responses were flexor bilaterally. Gait and Station: Rising up from seated position without assistance, normal stance,  moderate stride, good arm swing, smooth turning, able to perform tiptoe, and heel walking without difficulty. Tandem gait is unsteady.  No assistive device  DIAGNOSTIC DATA (LABS, IMAGING, TESTING) - I reviewed patient records, labs, notes, testing and imaging myself where available.  Lab Results  Component Value Date   WBC 9.4 12/17/2017   HGB 15.1 12/17/2017   HCT 47.1 12/17/2017   MCV 91.6 12/17/2017   PLT 362 12/17/2017      Component Value Date/Time   NA 139 12/17/2017 1058   K 4.2 12/17/2017 1058   CL 105 12/17/2017 1058   CO2 24 12/17/2017 1058   GLUCOSE 99 12/17/2017 1058   BUN 17 12/17/2017 1058   CREATININE 0.95 12/17/2017 1058   CALCIUM 9.8 12/17/2017 1058   PROT 6.7 07/07/2017 0223   ALBUMIN 3.8 07/07/2017 0223   AST 26 07/07/2017 0223   ALT 34 07/07/2017 0223   ALKPHOS 78 07/07/2017 0223   BILITOT 0.4 07/07/2017 0223   GFRNONAA >60 12/17/2017 1058   GFRAA >60 12/17/2017 1058   Lab Results  Component Value Date   CHOL 303 (H) 07/03/2017   HDL 31 (L) 07/03/2017   LDLCALC UNABLE TO CALCULATE IF TRIGLYCERIDE OVER 400 mg/dL 16/05/9603   TRIG 540 (H)  07/03/2017   CHOLHDL 9.8 07/03/2017   Lab Results  Component Value Date   HGBA1C 6.0 (H) 07/03/2017    ASSESSMENT AND PLAN 47 year old male with history of smoker, anxiety, substance abuse admitted for left sided weakness and slurred speech.  MRI showed acute right pontine infarct, subacute right PCA infarct, and old left PCA infarct.  MRA showed right P2 stenosis and left siphon atherosclerosis.  CTA head and neck confirmed high-grade right P2 stenosis and bilateral carotid siphon atherosclerosis.  TTE showed EF 20-25%.  LDL unable to calculate due to high TG at 539, A1c 6.0.  UDS showed positive cocaine and THC.  His cardiomyopathy may likely due to substance abuse.   He is currently on Coumadin  and Lipitor 80 with fenofibrate.   Therapies concluded.  He has stopped smoking.  He also denies further cocaine abuse   PLAN: Stressed the importance of management of risk factors to prevent further stroke Continue Coumadin for secondary stroke prevention Maintain strict control of hypertension with blood pressure goal below 130/90,  continue antihypertensive medications Control of diabetes with hemoglobin A1c below 6.5 followed by primary care  Cholesterol with LDL cholesterol less than 70, followed by primary care,   continue statin drugs Lipitor and fenofibrate Continue exercise at least 30 minutes daily  eat healthy diet with whole grains,  fresh fruits and vegetables Continue follow-up with cardiology F/U in 6 months if stable will discharge Nilda RiggsNancy Carolyn Corita Davies, [facility] CenterGNP, [facility] Mc[facility]ffieBC, APRN  [facility] [address], [address] [place], [place] [address] [phone]

## 2018-07-01 ENCOUNTER — Ambulatory Visit: Payer: Managed Care, Other (non HMO) | Admitting: Nurse Practitioner

## 2018-07-02 ENCOUNTER — Encounter: Payer: Self-pay | Admitting: Nurse Practitioner

## 2018-07-21 ENCOUNTER — Other Ambulatory Visit (HOSPITAL_COMMUNITY): Payer: Self-pay | Admitting: Internal Medicine

## 2018-07-30 ENCOUNTER — Other Ambulatory Visit (HOSPITAL_COMMUNITY): Payer: Self-pay | Admitting: Cardiology

## 2018-07-30 DIAGNOSIS — I5022 Chronic systolic (congestive) heart failure: Secondary | ICD-10-CM

## 2018-08-02 ENCOUNTER — Ambulatory Visit (HOSPITAL_COMMUNITY): Payer: Managed Care, Other (non HMO) | Attending: Cardiology

## 2018-08-02 ENCOUNTER — Other Ambulatory Visit: Payer: Self-pay

## 2018-08-02 DIAGNOSIS — I5022 Chronic systolic (congestive) heart failure: Secondary | ICD-10-CM

## 2018-08-02 MED ORDER — PERFLUTREN LIPID MICROSPHERE
1.0000 mL | INTRAVENOUS | Status: AC | PRN
  Administered 2018-08-02: 2 mL via INTRAVENOUS

## 2018-08-08 ENCOUNTER — Encounter (HOSPITAL_COMMUNITY): Payer: Managed Care, Other (non HMO) | Admitting: Internal Medicine

## 2018-08-08 ENCOUNTER — Other Ambulatory Visit (HOSPITAL_COMMUNITY): Payer: Managed Care, Other (non HMO)

## 2018-08-13 ENCOUNTER — Ambulatory Visit (HOSPITAL_COMMUNITY)
Admission: RE | Admit: 2018-08-13 | Discharge: 2018-08-13 | Disposition: A | Discharge status: Home / Self Care | Payer: Managed Care, Other (non HMO) | Source: Ambulatory Visit | Attending: Internal Medicine | Admitting: Internal Medicine

## 2018-08-13 ENCOUNTER — Encounter (HOSPITAL_COMMUNITY): Payer: Self-pay | Admitting: Internal Medicine

## 2018-08-13 ENCOUNTER — Other Ambulatory Visit: Payer: Self-pay

## 2018-08-13 ENCOUNTER — Encounter (HOSPITAL_COMMUNITY): Payer: Managed Care, Other (non HMO) | Admitting: Internal Medicine

## 2018-08-13 VITALS — BP 118/74 | HR 87 | Wt 191.0 lb

## 2018-08-13 DIAGNOSIS — Z8673 Personal history of transient ischemic attack (TIA), and cerebral infarction without residual deficits: Secondary | ICD-10-CM | POA: Insufficient documentation

## 2018-08-13 DIAGNOSIS — Z7901 Long term (current) use of anticoagulants: Secondary | ICD-10-CM | POA: Diagnosis not present

## 2018-08-13 DIAGNOSIS — I255 Ischemic cardiomyopathy: Secondary | ICD-10-CM | POA: Diagnosis not present

## 2018-08-13 DIAGNOSIS — I2510 Atherosclerotic heart disease of native coronary artery without angina pectoris: Secondary | ICD-10-CM | POA: Insufficient documentation

## 2018-08-13 DIAGNOSIS — I110 Hypertensive heart disease with heart failure: Secondary | ICD-10-CM | POA: Diagnosis present

## 2018-08-13 DIAGNOSIS — I5022 Chronic systolic (congestive) heart failure: Secondary | ICD-10-CM | POA: Diagnosis not present

## 2018-08-13 DIAGNOSIS — E785 Hyperlipidemia, unspecified: Secondary | ICD-10-CM | POA: Diagnosis not present

## 2018-08-13 DIAGNOSIS — Z823 Family history of stroke: Secondary | ICD-10-CM | POA: Insufficient documentation

## 2018-08-13 DIAGNOSIS — F909 Attention-deficit hyperactivity disorder, unspecified type: Secondary | ICD-10-CM | POA: Insufficient documentation

## 2018-08-13 DIAGNOSIS — F1010 Alcohol abuse, uncomplicated: Secondary | ICD-10-CM | POA: Insufficient documentation

## 2018-08-13 DIAGNOSIS — Z87891 Personal history of nicotine dependence: Secondary | ICD-10-CM | POA: Diagnosis not present

## 2018-08-13 DIAGNOSIS — L905 Scar conditions and fibrosis of skin: Secondary | ICD-10-CM | POA: Diagnosis not present

## 2018-08-13 DIAGNOSIS — Z87442 Personal history of urinary calculi: Secondary | ICD-10-CM | POA: Insufficient documentation

## 2018-08-13 DIAGNOSIS — Z79899 Other long term (current) drug therapy: Secondary | ICD-10-CM | POA: Insufficient documentation

## 2018-08-13 LAB — BASIC METABOLIC PANEL
Anion gap: 10 (ref 5–15)
BUN: 13 mg/dL (ref 6–20)
CO2: 25 mmol/L (ref 22–32)
CREATININE: 0.97 mg/dL (ref 0.61–1.24)
Calcium: 9.7 mg/dL (ref 8.9–10.3)
Chloride: 105 mmol/L (ref 98–111)
GFR calc Af Amer: >60 mL/min (ref >60)
GLUCOSE: 109 mg/dL — AB (ref 70–99)
Potassium: 4.2 mmol/L (ref 3.5–5.1)
SODIUM: 140 mmol/L (ref 135–145)

## 2018-08-13 MED ORDER — CARVEDILOL 6.25 MG PO TABS: 9.3750 mg | ORAL_TABLET | Freq: Two times a day (BID) | ORAL | 5 refills | Status: DC

## 2018-08-13 NOTE — Patient Instructions (Addendum)
INCREASE Carvedilol to 9.375mg  twice daily.  Routine lab work today. Will notify you of abnormal results   Follow up in 3-4 months.

## 2018-08-13 NOTE — Progress Notes (Signed)
[facility] Note   Primary Care: Dr. Susa Simmonds Primary Cardiologist: Dr. Gala Romney  HPI:  Angel Davies is a 48 y.o. male with PMH of HTN, ADHD, Obesity, anxiety, substance abuse, and depression.   Pt presented 07/02/2017 with CVA. UDS + for cocaine. Found to have new systolic CHF by echo.   Echo 07/02/17 with LVEF 20-25%, grade 2 DD, Trivial MR, Trivial TR -> HF team consulted.  With new CHF, pt underwent R/LHC as below that showed severe 3vD with no options for CABG/CAD. HF meds titrated as tolerated.  Admitted to CIR from 12/7 - 07/27/17 with marked improvement. Med titration somewhat limited due to soft pressures.   He presents today for HF follow up. Doing great. Graduated from cardiac rehab. Goes to the [facility] 3x/week for 45 minutes with a friend from cardiac rehab. Wants to increase to 1 hour gradually. No CP or SOB. No edema, orthopnea, or PND. Fingers occasionally swell. No dizziness. No bleeding on Eliquis. Occasional glass of red wine. Taking all medications. Limits salt intake. Drinks a lot of fluid.   Echo 12/17/17 EF 30-35% No apical thrombus.  Echo 08/02/18 EF 35-40%.  Review of systems complete and found to be negative unless listed in HPI.   cMRI on 07/06/17 with 1) Severe LVE with EF 18% septal and apical akinesis diffuse hypokinesis 2) Apical thrombus measuring 11 mm x 8 mm 3) Full thickness scar involving the mid and distal septum and apex Subendocardial scar involving the basal inferior lateral wall  R/LHC 07/05/17  Mid RCA to Dist RCA lesion is 100% stenosed.  Dist Cx lesion is 100% stenosed.  Mid LAD lesion is 100% stenosed.  Prox Cx to Dist Cx lesion is 30% stenosed.  Findings: Ao = 104/68 (84) LV = 110/9 RA = 1 RV = 19/3 PA = 19/9 (11) PCW = 5 Fick cardiac output/index = 4.3/2.1 PVR = 1.5 WU SVR = 2122 Ao sat = 96% PA sat = 64%, 66%  Assessment: 1. Severe CAD with left-dominant system and occluded mLAD 2. Ischemic CM with EF 25%  by echo 3. Well compensated filling pressures with moderately reduced cardiac output  Past Medical History:  Diagnosis Date  . ADHD (attention deficit hyperactivity disorder)   . Anxiety   . CVA (cerebral vascular accident) (HCC) 07/02/2017   "left sided weakness" (07/02/2017)  . Depression   . History of kidney stones    Current Outpatient Medications  Medication Sig Dispense Refill  . amphetamine-dextroamphetamine (ADDERALL) 20 MG tablet Take 20 mg by mouth 3 (three) times daily.    Marland Kitchen apixaban (ELIQUIS) 5 MG TABS tablet Take 5 mg by mouth 2 (two) times daily.    Marland Kitchen atorvastatin (LIPITOR) 80 MG tablet Take 1 tablet (80 mg total) by mouth daily at 6 PM. 30 tablet 5  . carvedilol (COREG) 6.25 MG tablet Take 1 tablet (6.25 mg total) by mouth 2 (two) times daily with a meal. 60 tablet 5  . ENTRESTO 97-103 MG TAKE ONE TABLET BY MOUTH TWICE DAILY  60 tablet 5  . fenofibrate 160 MG tablet TAKE ONE TABLET BY MOUTH ONE TIME DAILY  30 tablet 4  . folic acid (FOLVITE) 1 MG tablet Take 1 tablet (1 mg total) by mouth daily. 30 tablet 0  . Multiple Vitamin (MULTIVITAMIN WITH MINERALS) TABS tablet Take 1 tablet by mouth daily. 100 tablet 0  . nicotine (NICODERM CQ - DOSED IN MG/24 HOURS) 14 mg/24hr patch Place 1 patch (14 mg total)  onto the skin daily. 28 patch 0  . spironolactone (ALDACTONE) 25 MG tablet Take 0.5 tablets (12.5 mg total) by mouth at bedtime. 30 tablet 5  . thiamine 100 MG tablet Take 1 tablet (100 mg total) by mouth daily. 30 tablet 0  . traMADol (ULTRAM) 50 MG tablet Take 50 mg by mouth as needed for moderate pain (Every 4-6 hours PRN).    Marland Kitchen traZODone (DESYREL) 50 MG tablet Take 100-150 mg by mouth at bedtime as needed for sleep.     No current facility-administered medications for this encounter.    No Known Allergies  Social History   Socioeconomic History  . Marital status: Married    Spouse name: Not on file  . Number of children: Not on file  . Years of education: Not on  file  . Highest education level: Not on file  Occupational History  . Not on file  Social Needs  . Financial resource strain: Not on file  . Food insecurity:    Worry: Not on file    Inability: Not on file  . Transportation needs:    Medical: Not on file    Non-medical: Not on file  Tobacco Use  . Smoking status: Former Smoker    Packs/day: 0.00    Years: 0.00    Pack years: 0.00  . Smokeless tobacco: Never Used  Substance and Sexual Activity  . Alcohol use: Yes    Alcohol/week: 4.0 standard drinks    Types: 4 Glasses of wine per week  . Drug use: Yes    Types: Marijuana, "Crack" cocaine    Comment: 07/02/2017 "smokes marijuana a couple days/week" , "08/20/17 vapes marijuana juice"  . Sexual activity: Yes  Lifestyle  . Physical activity:    Days per week: Not on file    Minutes per session: Not on file  . Stress: Not on file  Relationships  . Social connections:    Talks on phone: Not on file    Gets together: Not on file    Attends religious service: Not on file    Active member of club or organization: Not on file    Attends meetings of clubs or organizations: Not on file    Relationship status: Not on file  . Intimate partner violence:    Fear of current or ex partner: Not on file    Emotionally abused: Not on file    Physically abused: Not on file    Forced sexual activity: Not on file  Other Topics Concern  . Not on file  Social History Narrative  . Not on file   Family History  Problem Relation Age of Onset  . Stroke Mother   . Colon cancer Mother   . Prostate cancer Father   . Seizures Neg Hx    Vitals:   08/13/18 1341  BP: 118/74  Pulse: 87  SpO2: 99%  Weight: 86.6 kg (191 lb)   Wt Readings from Last 3 Encounters:  08/13/18 86.6 kg (191 lb)  06/12/18 87.2 kg (192 lb 3.9 oz)  03/20/18 85.9 kg (189 lb 6 oz)   PHYSICAL EXAM: General: Well appearing. No resp difficulty. HEENT: Normal Neck: Supple. JVP flat. Carotids 2+ bilat; no bruits. No  thyromegaly or nodule noted. Cor: PMI nondisplaced. RRR, No M/G/R noted Lungs: CTAB, normal effort. Abdomen: Soft, non-tender, non-distended, no HSM. No bruits or masses. +BS  Extremities: No cyanosis, clubbing, or rash. R and LLE no edema. Cool extremities.  Neuro: Alert & orientedx3,  cranial nerves grossly intact. moves all 4 extremities w/o difficulty. Affect pleasant  EKG: NSR 92 bpm. Personally reviewed.   ASSESSMENT & PLAN:  1. Chronic systolic CHF, ICM - Echo 07/02/17 with LVEF 20-25%, grade 2 DD, Trivial MR, Trivial TR - cMRI 12/18 EF 18% - EF 12/17/17 ~35% range. Echo 08/02/18: EF 35-40%. Now out of ICD range.  - NYHA I symptoms - Volume status stable. Does not use lasix.  - Continue Entresto 97/103 mg BID. BMET today.  - Increase coreg to 9.375 mg BID.  - Continue spiro 12.5 daily 3. CAD - LHC 07/05/17 with severe CAD with left-dominant system and occluded mLAD. - Mostly distal CAD. No good targets for CABG or PCI. Continue medical therapy. Based on scar pattern on MRI, We are hopeful EF will improve at least moderately   - Continue statin. No ASA with coumadin - No s/s ischemia.  3. CVA - MRI Head 07/02/17 R pontine infarct and sub acute R temporal occipital/PCA territory infarct, along with old left occipital lobe/PCA territory infarct. - No bleeding eliquis.  - If cardiac apex improves can consider switching to ASA/Plavix - Minimal deficits. No change.  4. Cocaine abuse - Remains abstinent 5. Alcohol abuse - Encouraged responsible moderation. No change.  6. HLD - Continue atorvastatin 80 mg daily.  - Per PCP. No change.   Increase coreg BMET  Alford HighlandAshley M Smith, NP 08/13/18 1:45 PM  Patient seen and examined with the above-signed Advanced Practice Provider and/or Housestaff. I personally reviewed laboratory data, imaging studies and relevant notes. I independently examined the patient and formulated the important aspects of the plan. I have edited the note to reflect  any of my changes or salient points. I have personally discussed the plan with the patient and/or family.  Doing well. NYHA I. Volume status looks good. Recent echo EF 35-40%. No s/s of ischemia. Still with some weakness of his left but rehabbing. Increase carvedilol 9.375 bid.   [name], MD  2:44 PM

## 2018-08-13 NOTE — Addendum Note (Signed)
Encounter addended by: Modesta Messing, CMA on: 08/13/2018 3:20 PM  Actions taken: Clinical Note Signed, Charge Capture section accepted

## 2018-08-22 ENCOUNTER — Other Ambulatory Visit (HOSPITAL_COMMUNITY): Payer: Self-pay | Admitting: Internal Medicine

## 2018-09-04 DIAGNOSIS — Z0271 Encounter for disability determination: Secondary | ICD-10-CM

## 2018-09-25 ENCOUNTER — Other Ambulatory Visit (HOSPITAL_COMMUNITY): Payer: Self-pay

## 2018-09-25 ENCOUNTER — Telehealth (HOSPITAL_COMMUNITY): Payer: Self-pay | Admitting: Licensed Clinical Social Worker

## 2018-09-25 MED ORDER — APIXABAN 5 MG PO TABS: 5.0000 mg | ORAL_TABLET | Freq: Two times a day (BID) | ORAL | 3 refills | Status: DC

## 2018-09-25 MED ORDER — SACUBITRIL-VALSARTAN 97-103 MG PO TABS: 1.0000 | ORAL_TABLET | Freq: Two times a day (BID) | ORAL | 5 refills | Status: DC

## 2018-09-25 NOTE — Telephone Encounter (Signed)
CSW consulted to assist with Sherryll Burger and Eliquis patient assistance applications as patient has temporarily lost insurance.    CSW submitted completed applications for Entresto and Eliquis PAP and they are under review.  CSW provided 1 month samples of Entresto and Eliquis to patient while we await determination.  CSW will continue to follow and assist as needed  Burna Sis, LCSW Clinical Social Worker [facility] [phone]

## 2018-10-01 ENCOUNTER — Telehealth (HOSPITAL_COMMUNITY): Payer: Self-pay | Admitting: Licensed Clinical Social Worker

## 2018-10-01 NOTE — Telephone Encounter (Signed)
CSW received notice from [employer] that patient has been approved for [facility] assistance until 09/30/2019  Pt ID: [id number]  CSW called pt to update and provided with [employer] contact number to set up initial shipment- pt expressed understanding  CSW will continue to follow and assist as needed  Burna Sis, LCSW Clinical Social Worker [facility] [phone]

## 2018-10-17 ENCOUNTER — Other Ambulatory Visit (HOSPITAL_COMMUNITY): Payer: Self-pay | Admitting: Internal Medicine

## 2018-11-06 ENCOUNTER — Other Ambulatory Visit (HOSPITAL_COMMUNITY): Payer: Self-pay

## 2018-11-06 ENCOUNTER — Telehealth (HOSPITAL_COMMUNITY): Payer: Self-pay | Admitting: Licensed Clinical Social Worker

## 2018-11-06 MED ORDER — APIXABAN 5 MG PO TABS: 5.0000 mg | ORAL_TABLET | Freq: Two times a day (BID) | ORAL | 3 refills | Status: DC

## 2018-11-06 NOTE — Telephone Encounter (Signed)
CSW received call from pt regarding medication concerns.  Pt reports that his Eliquis would cost him $500 to pick up from the pharmacy and is wondering if samples are available.  Pt reports that he is in an insurance gap because he is on his wife's insurance and her company changed causing a 60 day gap in coverage for him.  Pt still has 45 days until he gets insurance and was denied for patient assistance through [employer] because him and his wife were over income limit.  CSW was able to utilize 30 day free card at pt pharmacy and confirmed it went through and pt will owe $0 at this time.  CSW updated pt.  CSW will continue to follow and assist as needed  Burna Sis, LCSW Clinical Social Worker [facility] Desk#: [phone] Cell#: [phone]

## 2018-11-08 ENCOUNTER — Telehealth (HOSPITAL_COMMUNITY): Payer: Managed Care, Other (non HMO) | Admitting: Internal Medicine

## 2018-11-12 ENCOUNTER — Encounter (HOSPITAL_COMMUNITY): Payer: Managed Care, Other (non HMO) | Admitting: Internal Medicine

## 2018-12-03 NOTE — Progress Notes (Signed)
Heart Failure TeleHealth Note  Due to national recommendations of social distancing due to COVID 19, Audio/video telehealth visit is felt to be most appropriate for this patient at this time.  See MyChart message from today for patient consent regarding telehealth for [facility].  Date:  12/04/2018   ID:  GURTEJ CLEGHORN, DOB [birth year], MRN [id number]  Location: Home  Provider location: [place] [facility] Type of Visit: Established patient   PCP:  Iva Boop, MD  Cardiologist:  No primary care provider on file. Primary HF: Dr Gala Romney  Chief Complaint: Heart Failure   History of Present Illness: DARIELL GEMMEL is a 48 y.o. male with a history of HTN, ADHD, Obesity, anxiety, substance abuse, and depression.   Pt presented 07/02/2017 with CVA. UDS + for cocaine. Found to have new systolic CHF by echo.   Echo 07/02/17 with LVEF 20-25%, grade 2 DD, Trivial MR, Trivial TR -> HF team consulted.  With new CHF, pt underwent R/LHC as below that showed severe 3vD with no options for CABG/CAD. HF meds titrated as tolerated.  Admitted to CIR from 12/7 - 07/27/17 with marked improvement. Med titration somewhat limited due to soft pressures.   He presents via audioconferencing for a telehealth visit today. Overall feeling fine. Denies SOB/PND/Orthopnea. Denies chest pain. Appetite ok. No fever or chills. Weight at home 183 pounds. Able exercise 20 minutes on 3 machines. Walking 6 miles a week. Taking all medications. He is not smoking or using cocaine.    he denies symptoms worrisome for COVID 19.   cMRI on 07/06/17 with 1) Severe LVE with EF 18% septal and apical akinesis diffuse hypokinesis 2) Apical thrombus measuring 11 mm x 8 mm 3) Full thickness scar involving the mid and distal septum and apex Subendocardial scar involving the basal inferior lateral wall  R/LHC 07/05/17  Mid RCA to Dist RCA lesion is 100% stenosed.  Dist Cx lesion is 100% stenosed.  Mid LAD  lesion is 100% stenosed.  Prox Cx to Dist Cx lesion is 30% stenosed.  Findings: Ao = 104/68 (84) LV = 110/9 RA = 1 RV = 19/3 PA = 19/9 (11) PCW = 5 Fick cardiac output/index = 4.3/2.1 PVR = 1.5 WU SVR = 2122 Ao sat = 96% PA sat = 64%, 66%  Assessment: 1. Severe CAD with left-dominant system and occluded mLAD 2. Ischemic CM with EF 25% by echo 3. Well compensated filling pressures with moderately reduced cardiac output  Past Medical History:  Diagnosis Date   ADHD (attention deficit hyperactivity disorder)    Anxiety    CVA (cerebral vascular accident) (HCC) 07/02/2017   "left sided weakness" (07/02/2017)   Depression    History of kidney stones    Past Surgical History:  Procedure Laterality Date   CYSTOSCOPY WITH URETEROSCOPY, STONE BASKETRY AND STENT PLACEMENT     RIGHT/LEFT HEART CATH AND CORONARY ANGIOGRAPHY N/A 07/05/2017   Procedure: RIGHT/LEFT HEART CATH AND CORONARY ANGIOGRAPHY;  Surgeon: Dolores Patty, MD;  Location: [facility] IN[facility];  Service: Cardiovascular;  Laterality: N/A;     Current Outpatient Medications  Medication Sig Dispense Refill   amphetamine-dextroamphetamine (ADDERALL) 20 MG tablet Take 20 mg by mouth 3 (three) times daily.     apixaban (ELIQUIS) 5 MG TABS tablet Take 1 tablet (5 mg total) by mouth 2 (two) times daily. 180 tablet 3   atorvastatin (LIPITOR) 80 MG tablet Take 1 tablet (80 mg total) by mouth daily at  6 PM. 30 tablet 5   carvedilol (COREG) 6.25 MG tablet Take 1.5 tablets (9.375 mg total) by mouth 2 (two) times daily with a meal. 90 tablet 5   fenofibrate 160 MG tablet TAKE ONE TABLET BY MOUTH ONE TIME DAILY  90 tablet 0   folic acid (FOLVITE) 1 MG tablet Take 1 tablet (1 mg total) by mouth daily. 30 tablet 0   Multiple Vitamin (MULTIVITAMIN WITH MINERALS) TABS tablet Take 1 tablet by mouth daily. 100 tablet 0   nicotine (NICODERM CQ - DOSED IN MG/24 HOURS) 14 mg/24hr patch Place 1 patch (14 mg total) onto  the skin daily. 28 patch 0   sacubitril-valsartan (ENTRESTO) 97-103 MG Take 1 tablet by mouth 2 (two) times daily. 60 tablet 5   spironolactone (ALDACTONE) 25 MG tablet TAKE 1/2 TABLET BY MOUTH DAILY AT BEDTIME  30 tablet 4   thiamine 100 MG tablet Take 1 tablet (100 mg total) by mouth daily. 30 tablet 0   traMADol (ULTRAM) 50 MG tablet Take 50 mg by mouth as needed for moderate pain (Every 4-6 hours PRN).     traZODone (DESYREL) 50 MG tablet Take 100-150 mg by mouth at bedtime as needed for sleep.     No current facility-administered medications for this encounter.     Allergies:   Patient has no known allergies.   Social History:  The patient  reports that he has quit smoking. He smoked 0.00 packs per day for 0.00 years. He has never used smokeless tobacco. He reports current alcohol use of about 4.0 standard drinks of alcohol per week. He reports current drug use. Drugs: Marijuana and "Crack" cocaine.   Family History:  The patient's family history includes Colon cancer in his mother; Prostate cancer in his father; Stroke in his mother.   ROS:  Please see the history of present illness.   All other systems are personally reviewed and negative.   Exam:  Tele Health Call; Exam is subjective  General:  Speaks in full sentences. No resp difficulty. Lungs: Normal respiratory effort with conversation.  Abdomen: Non-distended per patient report Extremities: Pt denies edema. Neuro: Alert & oriented x 3.   Recent Labs: 12/17/2017: Hemoglobin 15.1; Platelets 362 08/13/2018: BUN 13; Creatinine, Ser 0.97; Potassium 4.2; Sodium 140  Personally reviewed   Wt Readings from Last 3 Encounters:  08/13/18 86.6 kg (191 lb)  06/12/18 87.2 kg (192 lb 3.9 oz)  03/20/18 85.9 kg (189 lb 6 oz)      ASSESSMENT AND PLAN:  . Chronic systolic CHF, ICM - Echo 07/02/17 with LVEF 20-25%, grade 2 DD, Trivial MR, Trivial TR - cMRI 12/18 EF 18% - EF 12/17/17 ~35% range. Echo 08/02/18: EF 35-40%. Now out of  ICD range.  - NYHA I. Volume status sounds stable.  - Continue Entresto 97/103 mg BID.   - Continue coreg to 9.375 mg BID.  - Continue spiro 12.5 daily 3. CAD - LHC 07/05/17 with severe CAD with left-dominant system and occluded mLAD. - Mostly distal CAD. No good targets for CABG or PCI. Continue medical therapy. Based on scar pattern on MRI, We are hopeful EF will improve at least moderately  -Continue statin. Refill statin. No ASA with coumadin - No s/s ischemia  3. CVA - MRI Head 07/02/17 R pontine infarct and sub acute R temporal occipital/PCA territory infarct, along with old left occipital lobe/PCA territory infarct. - Continue eliquis.  - If cardiac apex improves can consider switching to ASA/Plavix - Minimal deficits.  No change.  4. Cocaine abuse -Remains abstinent 5. Alcohol abuse Rarely drinks alcohol.  6. HLD - Continue atorvastatin 80 mg daily. Refil atorvastatin.  - Per PCP. No change.    COVID screen The patient does not have any symptoms that suggest any further testing/ screening at this time.  Social distancing reinforced today.  Patient Risk: After full review of this patients clinical status, I feel that they are at moderate risk for cardiac decompensation at this time.  Relevant cardiac medications were reviewed at length with the patient today. The patient does not have concerns regarding their medications at this time.   The following changes were made today:  Refill atorvastatin.  Recommended follow-up:  Follow up 3-4  Months Dr Gala RomneyBensimhon  Today, I have spent 15 minutes with the patient with telehealth technology discussing the above issues .    Waneta MartinsSigned, Maeola Mchaney, NP  12/04/2018 12:38 PM  [facility] [address] [facility] [place] [address] [phone] (office) [phone] (fax)

## 2018-12-04 ENCOUNTER — Encounter (HOSPITAL_COMMUNITY): Payer: Self-pay

## 2018-12-04 ENCOUNTER — Ambulatory Visit (HOSPITAL_COMMUNITY)
Admission: RE | Admit: 2018-12-04 | Discharge: 2018-12-04 | Disposition: A | Discharge status: Home / Self Care | Payer: Self-pay | Source: Ambulatory Visit | Attending: Internal Medicine | Admitting: Internal Medicine

## 2018-12-04 ENCOUNTER — Other Ambulatory Visit: Payer: Self-pay

## 2018-12-04 VITALS — Wt 183.0 lb

## 2018-12-04 DIAGNOSIS — I5022 Chronic systolic (congestive) heart failure: Secondary | ICD-10-CM

## 2018-12-04 DIAGNOSIS — I2510 Atherosclerotic heart disease of native coronary artery without angina pectoris: Secondary | ICD-10-CM

## 2018-12-04 DIAGNOSIS — E782 Mixed hyperlipidemia: Secondary | ICD-10-CM

## 2018-12-04 MED ORDER — ATORVASTATIN CALCIUM 80 MG PO TABS: 80.0000 mg | ORAL_TABLET | Freq: Every day | ORAL | 3 refills | Status: DC

## 2018-12-04 NOTE — Addendum Note (Signed)
Encounter addended by: Shea Stakes, RN on: 12/04/2018 1:06 PM  Actions taken: Pharmacy for encounter modified, Order list changed, Clinical Note Signed

## 2018-12-04 NOTE — Patient Instructions (Signed)
Follow up with Dr Gala Romney on 6 August @ 1:40 pm, the garage code will be [id number].  Your Atorvastatin has been refilled.  Please call the office at [phone], option 2 for the nurses, with any questions or concerns.

## 2019-01-20 ENCOUNTER — Other Ambulatory Visit (HOSPITAL_COMMUNITY): Payer: Self-pay | Admitting: Internal Medicine

## 2019-02-24 ENCOUNTER — Other Ambulatory Visit (HOSPITAL_COMMUNITY): Payer: Self-pay | Admitting: Cardiology

## 2019-03-05 DIAGNOSIS — Z20828 Contact with and (suspected) exposure to other viral communicable diseases: Secondary | ICD-10-CM | POA: Diagnosis not present

## 2019-03-05 DIAGNOSIS — J209 Acute bronchitis, unspecified: Secondary | ICD-10-CM | POA: Diagnosis not present

## 2019-03-06 ENCOUNTER — Encounter (HOSPITAL_COMMUNITY): Payer: Self-pay | Admitting: Internal Medicine

## 2019-03-20 DIAGNOSIS — F410 Panic disorder [episodic paroxysmal anxiety] without agoraphobia: Secondary | ICD-10-CM | POA: Diagnosis not present

## 2019-03-20 DIAGNOSIS — F900 Attention-deficit hyperactivity disorder, predominantly inattentive type: Secondary | ICD-10-CM | POA: Diagnosis not present

## 2019-03-20 DIAGNOSIS — F3342 Major depressive disorder, recurrent, in full remission: Secondary | ICD-10-CM | POA: Diagnosis not present

## 2019-03-28 DIAGNOSIS — F3341 Major depressive disorder, recurrent, in partial remission: Secondary | ICD-10-CM | POA: Diagnosis not present

## 2019-03-28 DIAGNOSIS — F1910 Other psychoactive substance abuse, uncomplicated: Secondary | ICD-10-CM | POA: Diagnosis not present

## 2019-03-28 DIAGNOSIS — E119 Type 2 diabetes mellitus without complications: Secondary | ICD-10-CM | POA: Diagnosis not present

## 2019-03-28 DIAGNOSIS — F411 Generalized anxiety disorder: Secondary | ICD-10-CM | POA: Diagnosis not present

## 2019-04-03 DIAGNOSIS — M792 Neuralgia and neuritis, unspecified: Secondary | ICD-10-CM | POA: Diagnosis not present

## 2019-04-03 DIAGNOSIS — E782 Mixed hyperlipidemia: Secondary | ICD-10-CM | POA: Diagnosis not present

## 2019-04-03 DIAGNOSIS — E119 Type 2 diabetes mellitus without complications: Secondary | ICD-10-CM | POA: Diagnosis not present

## 2019-04-15 ENCOUNTER — Other Ambulatory Visit (HOSPITAL_COMMUNITY): Payer: Self-pay

## 2019-04-15 ENCOUNTER — Other Ambulatory Visit (HOSPITAL_COMMUNITY): Payer: Self-pay | Admitting: Internal Medicine

## 2019-04-15 MED ORDER — ENTRESTO 97-103 MG PO TABS: 1.0000 | ORAL_TABLET | Freq: Two times a day (BID) | ORAL | 5 refills | Status: DC

## 2019-04-30 ENCOUNTER — Ambulatory Visit (HOSPITAL_COMMUNITY)
Admission: RE | Admit: 2019-04-30 | Discharge: 2019-04-30 | Disposition: A | Discharge status: Home / Self Care | Payer: BC Managed Care – PPO | Source: Ambulatory Visit | Attending: Internal Medicine | Admitting: Internal Medicine

## 2019-04-30 ENCOUNTER — Other Ambulatory Visit: Payer: Self-pay

## 2019-04-30 ENCOUNTER — Encounter (HOSPITAL_COMMUNITY): Payer: Self-pay | Admitting: Internal Medicine

## 2019-04-30 VITALS — BP 132/84 | HR 79

## 2019-04-30 DIAGNOSIS — I2510 Atherosclerotic heart disease of native coronary artery without angina pectoris: Secondary | ICD-10-CM | POA: Insufficient documentation

## 2019-04-30 DIAGNOSIS — Z7901 Long term (current) use of anticoagulants: Secondary | ICD-10-CM | POA: Insufficient documentation

## 2019-04-30 DIAGNOSIS — I5022 Chronic systolic (congestive) heart failure: Secondary | ICD-10-CM | POA: Diagnosis not present

## 2019-04-30 DIAGNOSIS — Z87891 Personal history of nicotine dependence: Secondary | ICD-10-CM | POA: Insufficient documentation

## 2019-04-30 DIAGNOSIS — I236 Thrombosis of atrium, auricular appendage, and ventricle as current complications following acute myocardial infarction: Secondary | ICD-10-CM | POA: Diagnosis not present

## 2019-04-30 DIAGNOSIS — Z8249 Family history of ischemic heart disease and other diseases of the circulatory system: Secondary | ICD-10-CM | POA: Insufficient documentation

## 2019-04-30 DIAGNOSIS — F909 Attention-deficit hyperactivity disorder, unspecified type: Secondary | ICD-10-CM | POA: Diagnosis not present

## 2019-04-30 DIAGNOSIS — Z87442 Personal history of urinary calculi: Secondary | ICD-10-CM | POA: Insufficient documentation

## 2019-04-30 DIAGNOSIS — I110 Hypertensive heart disease with heart failure: Secondary | ICD-10-CM | POA: Insufficient documentation

## 2019-04-30 DIAGNOSIS — I255 Ischemic cardiomyopathy: Secondary | ICD-10-CM | POA: Insufficient documentation

## 2019-04-30 DIAGNOSIS — E785 Hyperlipidemia, unspecified: Secondary | ICD-10-CM | POA: Insufficient documentation

## 2019-04-30 DIAGNOSIS — F1010 Alcohol abuse, uncomplicated: Secondary | ICD-10-CM | POA: Diagnosis not present

## 2019-04-30 DIAGNOSIS — Z8673 Personal history of transient ischemic attack (TIA), and cerebral infarction without residual deficits: Secondary | ICD-10-CM | POA: Insufficient documentation

## 2019-04-30 DIAGNOSIS — F419 Anxiety disorder, unspecified: Secondary | ICD-10-CM | POA: Diagnosis not present

## 2019-04-30 DIAGNOSIS — Z79899 Other long term (current) drug therapy: Secondary | ICD-10-CM | POA: Diagnosis not present

## 2019-04-30 DIAGNOSIS — R0602 Shortness of breath: Secondary | ICD-10-CM | POA: Diagnosis not present

## 2019-04-30 MED ORDER — CARVEDILOL 6.25 MG PO TABS: 9.3750 mg | ORAL_TABLET | Freq: Two times a day (BID) | ORAL | 3 refills | Status: DC

## 2019-04-30 NOTE — Patient Instructions (Signed)
Medication Change:   Increased Carvedilol to 9.375. ( 1.5 tablets ) two times a day.  Your physician has sent a referral for the [facility] Clinic. That office will call you to set that appointment up.  At the [facility], you and your health needs are our priority. As part of our continuing mission to provide you with exceptional heart care, we have created designated Provider Care Teams. These Care Teams include your primary Cardiologist (physician) and Advanced Practice Providers (APPs- Physician Assistants and Nurse Practitioners) who all work together to provide you with the care you need, when you need it.   You may see any of the following providers on your designated Care Team at your next follow up: Marland Kitchen Dr Glori Bickers . Dr Loralie Champagne . Darrick Grinder, NP   Please be sure to bring in all your medications bottles to every appointment.

## 2019-04-30 NOTE — Progress Notes (Signed)
Advanced Heart Failure Clinic Note   Primary Care: Dr. Susa SimmondsVia Primary Cardiologist: Dr. Gala RomneyBensimhon  HPI:  Angel Davies is a 48 y.o. male with PMH of HTN, ADHD, Obesity, anxiety, substance abuse, and depression.   Pt presented 07/02/2017 with CVA. UDS + for cocaine. Found to have new systolic CHF by echo.   Echo 07/02/17 with LVEF 20-25%, grade 2 DD, Trivial MR, Trivial TR -> HF team consulted.  With new CHF, pt underwent R/LHC as below that showed severe 3vD with no options for CABG/CAD. HF meds titrated as tolerated.  Admitted to CIR from 12/7 - 07/27/17 with marked improvement. Med titration somewhat limited due to soft pressures.   He presents today for HF follow up. Had COVID-19 over the summer and recovered. Feels great. [facility] to [facility] 3 days per week for 1 hour at a time doing cardio. That said he can do more work on cardio machines then when he walks. Has much more energy. No CP, SOB, orthopnea, PND or edema.  No bleeding on Eliquis. Taking all medications. Lost 10-15 pounds. There is BP cuff at [facility] and occasionally SBP in 120s but rarely will be 95-100.   Echo 12/17/17 EF 30-35% No apical thrombus.  Echo 08/02/18 EF 35-40%.  Review of systems complete and found to be negative unless listed in HPI.   cMRI on 07/06/17 with 1) Severe LVE with EF 18% septal and apical akinesis diffuse hypokinesis 2) Apical thrombus measuring 11 mm x 8 mm 3) Full thickness scar involving the mid and distal septum and apex Subendocardial scar involving the basal inferior lateral wall  R/LHC 07/05/17  Mid RCA to Dist RCA lesion is 100% stenosed.  Dist Cx lesion is 100% stenosed.  Mid LAD lesion is 100% stenosed.  Prox Cx to Dist Cx lesion is 30% stenosed.  Findings: Ao = 104/68 (84) LV = 110/9 RA = 1 RV = 19/3 PA = 19/9 (11) PCW = 5 Fick cardiac output/index = 4.3/2.1 PVR = 1.5 WU SVR = 2122 Ao sat = 96% PA sat = 64%, 66%  Assessment: 1. Severe CAD with left-dominant system and occluded  mLAD 2. Ischemic CM with EF 25% by echo 3. Well compensated filling pressures with moderately reduced cardiac output  Past Medical History:  Diagnosis Date  . ADHD (attention deficit hyperactivity disorder)   . Anxiety   . CVA (cerebral vascular accident) (HCC) 07/02/2017   "left sided weakness" (07/02/2017)  . Depression   . History of kidney stones    Current Outpatient Medications  Medication Sig Dispense Refill  . amphetamine-dextroamphetamine (ADDERALL) 20 MG tablet Take 20 mg by mouth 3 (three) times daily.    Marland Kitchen. apixaban (ELIQUIS) 5 MG TABS tablet Take 1 tablet (5 mg total) by mouth 2 (two) times daily. 180 tablet 3  . atorvastatin (LIPITOR) 80 MG tablet Take 1 tablet (80 mg total) by mouth daily at 6 PM. 90 tablet 3  . carvedilol (COREG) 6.25 MG tablet TAKE ONE AND ONE HALF TABLET BY MOUTH TWICE DAILY WITH A MEAL 90 tablet 0  . clonazePAM (KLONOPIN) 1 MG tablet Take 1 mg by mouth as needed.    . fenofibrate 160 MG tablet TAKE ONE TABLET BY MOUTH ONE TIME DAILY  90 tablet 0  . folic acid (FOLVITE) 1 MG tablet Take 1 tablet (1 mg total) by mouth daily. 30 tablet 0  . Multiple Vitamin (MULTIVITAMIN WITH MINERALS) TABS tablet Take 1 tablet by mouth daily. 100 tablet 0  .  sacubitril-valsartan (ENTRESTO) 97-103 MG Take 1 tablet by mouth 2 (two) times daily. 60 tablet 5  . spironolactone (ALDACTONE) 25 MG tablet TAKE 1/2 TABLET BY MOUTH DAILY AT BEDTIME  30 tablet 4  . thiamine 100 MG tablet Take 1 tablet (100 mg total) by mouth daily. 30 tablet 0  . traZODone (DESYREL) 50 MG tablet Take 100-150 mg by mouth at bedtime as needed for sleep.     No current facility-administered medications for this encounter.    No Known Allergies  Social History   Socioeconomic History  . Marital status: Married    Spouse name: Not on file  . Number of children: Not on file  . Years of education: Not on file  . Highest education level: Not on file  Occupational History  . Not on file  Social  Needs  . Financial resource strain: Not on file  . Food insecurity    Worry: Not on file    Inability: Not on file  . Transportation needs    Medical: Not on file    Non-medical: Not on file  Tobacco Use  . Smoking status: Former Smoker    Packs/day: 0.00    Years: 0.00    Pack years: 0.00  . Smokeless tobacco: Never Used  Substance and Sexual Activity  . Alcohol use: Yes    Alcohol/week: 4.0 standard drinks    Types: 4 Glasses of wine per week  . Drug use: Yes    Types: Marijuana, "Crack" cocaine    Comment: 07/02/2017 "smokes marijuana a couple days/week" , "08/20/17 vapes marijuana juice"  . Sexual activity: Yes  Lifestyle  . Physical activity    Days per week: Not on file    Minutes per session: Not on file  . Stress: Not on file  Relationships  . Social Musician on phone: Not on file    Gets together: Not on file    Attends religious service: Not on file    Active member of club or organization: Not on file    Attends meetings of clubs or organizations: Not on file    Relationship status: Not on file  . Intimate partner violence    Fear of current or ex partner: Not on file    Emotionally abused: Not on file    Physically abused: Not on file    Forced sexual activity: Not on file  Other Topics Concern  . Not on file  Social History Narrative  . Not on file   Family History  Problem Relation Age of Onset  . Stroke Mother   . Colon cancer Mother   . Prostate cancer Father   . Seizures Neg Hx    Vitals:   04/30/19 1147  BP: 132/84  Pulse: 79  SpO2: 98%   Wt Readings from Last 3 Encounters:  12/04/18 83 kg (183 lb)  08/13/18 86.6 kg (191 lb)  06/12/18 87.2 kg (192 lb 3.9 oz)   PHYSICAL EXAM: General:  Well appearing. No resp difficulty HEENT: normal Neck: supple. no JVD. Carotids 2+ bilat; no bruits. No lymphadenopathy or thryomegaly appreciated. Cor: PMI nondisplaced. Regular rate & rhythm. No rubs, gallops or murmurs. Lungs: clear  Abdomen: soft, nontender, nondistended. No hepatosplenomegaly. No bruits or masses. Good bowel sounds. Extremities: no cyanosis, clubbing, rash, edema Neuro: alert & orientedx3, cranial nerves grossly intact. moves all 4 extremities w/o difficulty. Affect pleasant  ASSESSMENT & PLAN:  1. Chronic systolic CHF, ICM - Echo 07/02/17 with  LVEF 20-25%, grade 2 DD, Trivial MR, Trivial TR - cMRI 12/18 EF 18% - EF 12/17/17 ~35% range. Echo 08/02/18: EF 35-40%. Now out of ICD range.  -  Doing well. NYHA I-early II - Volume status looks good. Does not use lasix.  - Continue Entresto 97/103 mg BID. BMET today.  - Increase carvedilol to 9.375 mg BID.  - Continue spiro 12.5 daily; last K was 4.6 in 9/20 3. CAD - LHC 07/05/17 with severe CAD with left-dominant system and occluded mLAD. - Mostly distal CAD. No good targets for CABG or PCI. Continue medical therapy. Based on scar pattern on MRI, We are hopeful EF will improve at least moderately   - Continue statin. No ASA with coumadin - No s/s ischemia  3. CVA - MRI Head 07/02/17 R pontine infarct and sub acute R temporal occipital/PCA territory infarct, along with old left occipital lobe/PCA territory infarct. - No bleeding on eliquis.  - If cardiac apex improves can consider switching to ASA/Plavix - Minimal deficits. No change.  4. Cocaine abuse - Remains abstinent 5. Alcohol abuse - Encouraged responsible moderation. No change.  6. HLD - Last lipids 9/20 TC 164 HDL 38 TG 101 (down from 539) LDL 107 - Continue atorvastatin 80 mg daily.  - Refer to [facility] for possible PCSK-9   Glori Bickers, MD 04/30/19 12:02 PM

## 2019-05-05 NOTE — Progress Notes (Signed)
**Note De-Identified Angel Davies Obfuscation** [facility]    Provider:  Dr Angel Davies Requesting Provider: [name], MD Primary Care Provider:  Gweneth Davies, Wendy, MD  CC:  Feels like he put his left leg in a fire, started after he "came down" on his heel and now random   HPI:  Angel Davies is a 48 y.o. male here as requested by Angel Davies, Angel BeeKevin, MD for lower extremity intermittent neuralgia. PMHx 48 year old male here for left eye loss of vision and in December 2018 subsequent right pontine stroke in the setting of cocaine use, confusion and fall and head trauma.  He also has a past medical history of hypertension, ADHD, obesity, anxiety, substance abuse and depression.  He was admitted to [facility] from 12/7 to 07/27/2017 with marked improvement. Here for a new request for intermittent neuralgia. He is in a stroke support group, he sees cardiology regularly. He has been working on diet and exercise and getting his cholesterol down.  Today here for a new problem as above. Happens when he hits his heel. Started 3-4 months, Left foot, started when he came down hard on the heel, now is happening without inciting event, shoots up the back of the leg, and starts in the heel, severe, shooting pain over and over, no associated new weakness, he does a lot of balance board, no foot drop, happens 3 times a week, can last hours, ice helps, no ankle joint pain, no heel pain, no pain on the bottom of his foot, doesn't feel like a musculoskeletal injury more shooting, sharp, burning, severe, just under the skin. Extra strength tylenol helps. No new weakness. No other new focal neurologic deficits, associated symptoms, inciting events or modifiable factors.   Reviewed notes, labs and imaging from outside physicians, which showed:   I reviewed Angel. Prescott Davies's notes, he is feeling great[facility] at [facility].  No cardiac symptoms, no chest pain, no shortness of breath, no orthopnea, PND or edema, he is on  Eliquis for congestive heart failure and stroke.  He has lost 10 to 15 pounds.  His most recent ejection fraction in January 2020 was 35 to 40%.  In December 2018 acute onset stroke in the setting of his severe left ventricular reduced EF 18%.  He has residual right-sided weakness since 2018 his stroke.  He has stopped using cocaine.  Bmp with elevated glucose otherwise nml, nml BUN and Creat(13,0.97) 07/2018   Review of Systems: Patient complains of symptoms per HPI as well as the following symptoms: nerve pain. Pertinent negatives and positives per HPI. All others negative.   Social History   Socioeconomic History  . Marital status: Married    Spouse name: Not on file  . Number of children: Not on file  . Years of education: Not on file  . Highest education level: Not on file  Occupational History  . Not on file  Social Needs  . Financial resource strain: Not on file  . Food insecurity    Worry: Not on file    Inability: Not on file  . Transportation needs    Medical: Not on file    Non-medical: Not on file  Tobacco Use  . Smoking status: Former Smoker    Packs/day: 0.00    Years: 0.00    Pack years: 0.00    Types: Cigarettes, E-cigarettes  . Smokeless tobacco: Former Engineer, waterUser  Substance and Sexual Activity  . Alcohol use: Yes  Alcohol/week: 1.0 standard drinks    Types: 1 Glasses of wine per week    Comment: not weekly, social   . Drug use: Yes    Frequency: 7.0 times per week    Types: Marijuana    Comment: 07/02/2017 "smokes marijuana a couple days/week" , "08/20/17 vapes marijuana juice"  . Sexual activity: Yes  Lifestyle  . Physical activity    Days per week: Not on file    Minutes per session: Not on file  . Stress: Not on file  Relationships  . Social Herbalist on phone: Not on file    Gets together: Not on file    Attends religious service: Not on file    Active member of club or organization: Not on file    Attends meetings of clubs or  organizations: Not on file    Relationship status: Not on file  . Intimate partner violence    Fear of current or ex partner: Not on file    Emotionally abused: Not on file    Physically abused: Not on file    Forced sexual activity: Not on file  Other Topics Concern  . Not on file  Social History Narrative   Lives at home with wife and his father lives with them   Right handed    Family History  Problem Relation Age of Onset  . Stroke Mother   . Colon cancer Mother   . Prostate cancer Father   . Seizures Neg Hx     Past Medical History:  Diagnosis Date  . ADHD (attention deficit hyperactivity disorder)   . Anxiety   . CVA (cerebral vascular accident) ([place]) 07/02/2017   "left sided weakness" (07/02/2017)  . Depression   . History of kidney stones     Patient Active Problem List   Diagnosis Date Noted  . 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 Chronic systolic heart failure ([place]) 08/07/2017  . Encounter for therapeutic drug monitoring 07/27/2017  . Muscle pain   . Supratherapeutic INR   . Leukocytosis   . Coronary artery disease involving native coronary artery of native heart without angina pectoris   . Prediabetes   . Hemorrhoids   . Right pontine stroke ([place]) 07/06/2017  . Left hemiparesis ([place])   . Cardiomyopathy, ischemic   . 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 Myocardiopathy ([place])   . Mixed hyperlipidemia   . CVA (cerebral vascular accident) ([place]) 07/02/2017  . Alcohol use 07/02/2017  . Anxiety 07/02/2017  . Depression 07/02/2017  . Acute hyperglycemia 07/02/2017  . Elevated blood-pressure reading without diagnosis of hypertension 07/02/2017  . ADHD 07/02/2017  . Cocaine abuse ([place]) 07/02/2017  . Marijuana abuse 06/20/2017  . Cocaine use 06/20/2017    Past Surgical History:  Procedure Laterality Date  . CYSTOSCOPY WITH URETEROSCOPY, STONE BASKETRY AND STENT PLACEMENT    . RIGHT/LEFT HEART CATH AND CORONARY ANGIOGRAPHY N/A  07/05/2017   Procedure: RIGHT/LEFT HEART CATH AND CORONARY ANGIOGRAPHY;  Surgeon: Jolaine Artist, MD;  Location: [facility];  Service: Cardiovascular;  Laterality: N/A;    Current Outpatient Medications  Medication Sig Dispense Refill  . amphetamine-dextroamphetamine (ADDERALL) 20 MG tablet Take 20 mg by mouth 3 (three) times daily.    Marland Kitchen apixaban (ELIQUIS) 5 MG TABS tablet Take 1 tablet (5 mg total) by mouth 2 (two) times daily. 180 tablet 3  . atorvastatin (LIPITOR) 80 MG tablet Take 1 tablet (80 mg total) by  mouth daily at 6 PM. 90 tablet 3  . carvedilol (COREG) 6.25 MG tablet Take 1.5 tablets (9.375 mg total) by mouth 2 (two) times daily. Dosage change 90 tablet 3  . clonazePAM (KLONOPIN) 1 MG tablet Take 1 mg by mouth as needed.    . fenofibrate 160 MG tablet TAKE ONE TABLET BY MOUTH ONE TIME DAILY  90 tablet 0  . folic acid (FOLVITE) 1 MG tablet Take 1 tablet (1 mg total) by mouth daily. 30 tablet 0  . Multiple Vitamin (MULTIVITAMIN WITH MINERALS) TABS tablet Take 1 tablet by mouth daily. 100 tablet 0  . sacubitril-valsartan (ENTRESTO) 97-103 MG Take 1 tablet by mouth 2 (two) times daily. 60 tablet 5  . spironolactone (ALDACTONE) 25 MG tablet TAKE 1/2 TABLET BY MOUTH DAILY AT BEDTIME  30 tablet 4  . thiamine 100 MG tablet Take 1 tablet (100 mg total) by mouth daily. 30 tablet 0  . traZODone (DESYREL) 50 MG tablet Take 100-150 mg by mouth at bedtime as needed for sleep.    Marland Kitchen gabapentin (NEURONTIN) 300 MG capsule Take 1 capsule (300 mg total) by mouth 3 (three) times daily as needed. 30 capsule 3   No current facility-administered medications for this visit.     Allergies as of 05/06/2019  . (No Known Allergies)    Vitals: BP 108/66 (BP Location: Right Arm, Patient Position: Sitting)   Pulse 84   Temp (!) 97.3 F (36.3 C) Comment: taken at front door  Ht 5\' 10"  (1.778 m)   Wt 193 lb (87.5 kg)   BMI 27.69 kg/m  Last Weight:  Wt Readings from Last 1 Encounters:   05/06/19 193 lb (87.5 kg)   Last Height:   Ht Readings from Last 1 Encounters:  05/06/19 5\' 10"  (1.778 m)     Physical exam: Exam: Gen: NAD, conversant, well nourised                  CV: RRR, no MRG. No Carotid Bruits. No peripheral edema, warm, nontender Eyes: Conjunctivae clear without exudates or hemorrhage  Neuro: Detailed Neurologic Exam  Speech:    Speech is normal; fluent and spontaneous with normal comprehension.  Cognition:    The patient is oriented to person, place, and time;     recent and remote memory intact;     language fluent;     normal attention, concentration,     fund of knowledge Cranial Nerves:    The pupils are equal, round, and reactive to light. Attempted fundoscopic exam but could not visualize. Visual fields are full to finger confrontation. Extraocular movements are intact. Trigeminal sensation is intact and the muscles of mastication are normal. The face is symmetric. The palate elevates in the midline. Hearing intact. Voice is normal. Shoulder shrug is normal. The tongue has normal motion without fasciculations.   Coordination:    Normal finger to nose and heel to shin.  Gait:    Normal gait  Motor Observation:    No asymmetry, no atrophy, and no involuntary movements noted. Tone:    Normal muscle tone.    Posture:    Posture is normal. normal erect    Strength: Mild left lower extremity proximal weakness otherwise strength is V/V in the upper and lower limbs.      Sensation: decrease left posterior calf (Tibial)     Reflex Exam:  DTR's: left-sided reflexes are brisker than the right      Toes:    The toes are  downgoing bilaterally.   Clonus:    Clonus is absent.    Assessment/Plan:  48 year old with ankle and heel tightness and nerve pain in the Tibial nerve distribution, Tibial Neuropathy likely coming from the ankle possibly entrapment at achilles or tarsal tunnel or other ligament or bony etiology.  Orders Placed This  Encounter  Procedures  . DG Ankle Complete Left  . Ambulatory referral to Orthopedic Surgery   Meds ordered this encounter  Medications  . gabapentin (NEURONTIN) 300 MG capsule    Sig: Take 1 capsule (300 mg total) by mouth 3 (three) times daily as needed.    Dispense:  30 capsule    Refill:  3   Left ankle - xray Orthopaedics - First available for Tibial Neuropathy likely coming from the ankle possibly entrapment at achilles or tarsal tunnel or other ligament EMG/NCS - 6-8 weeks, would prefer to see Ortho first and cancel if unnecessary Gabapentin for pain  Cc: Angel Davies, Angel Bee, MD,  Naomie Dean, MD  [facility] [address] [place], [place] [address]  Phone [phone] Fax [phone]

## 2019-05-06 ENCOUNTER — Other Ambulatory Visit: Payer: Self-pay

## 2019-05-06 ENCOUNTER — Ambulatory Visit: Payer: BC Managed Care – PPO | Admitting: Neurology

## 2019-05-06 ENCOUNTER — Encounter: Payer: Self-pay | Admitting: Neurology

## 2019-05-06 VITALS — BP 108/66 | HR 84 | Temp 97.3°F | Ht 70.0 in | Wt 193.0 lb

## 2019-05-06 DIAGNOSIS — S99912A Unspecified injury of left ankle, initial encounter: Secondary | ICD-10-CM

## 2019-05-06 DIAGNOSIS — G589 Mononeuropathy, unspecified: Secondary | ICD-10-CM | POA: Diagnosis not present

## 2019-05-06 DIAGNOSIS — G5742 Lesion of medial popliteal nerve, left lower limb: Secondary | ICD-10-CM | POA: Diagnosis not present

## 2019-05-06 MED ORDER — GABAPENTIN 300 MG PO CAPS: 300.0000 mg | ORAL_CAPSULE | Freq: Three times a day (TID) | ORAL | 3 refills | Status: DC | PRN

## 2019-05-06 NOTE — Patient Instructions (Addendum)
Left ankle - xray, [facility] [address] [phone] call for walk-in hours Orthopaedics - First available for Tibial Neuropathy likely coming from the ankle possibly entrapment at achilles or tarsal tunnel or other ligament EMG/NCS - 6-8 weeks, would prefer to see Ortho first and cancel if unnecessary Gabapentin for pain  Gabapentin capsules or tablets What is this medicine? GABAPENTIN (GA ba pen tin) is used to control seizures in certain types of epilepsy. It is also used to treat certain types of nerve pain. This medicine may be used for other purposes; ask your health care provider or pharmacist if you have questions. COMMON BRAND NAME(S): Active-PAC with Gabapentin, Gabarone, Neurontin What should I tell my health care provider before I take this medicine? They need to know if you have any of these conditions:  history of drug abuse or alcohol abuse problem  kidney disease  lung or breathing disease  suicidal thoughts, plans, or attempt; a previous suicide attempt by you or a family member  an unusual or allergic reaction to gabapentin, other medicines, foods, dyes, or preservatives  pregnant or trying to get pregnant  breast-feeding How should I use this medicine? Take this medicine by mouth with a glass of water. Follow the directions on the prescription label. You can take it with or without food. If it upsets your stomach, take it with food. Take your medicine at regular intervals. Do not take it more often than directed. Do not stop taking except on your doctor's advice. If you are directed to break the 600 or 800 mg tablets in half as part of your dose, the extra half tablet should be used for the next dose. If you have not used the extra half tablet within 28 days, it should be thrown away. A special MedGuide will be given to you by the pharmacist with each prescription and refill. Be sure to read this information carefully each time. Talk to your pediatrician  regarding the use of this medicine in children. While this drug may be prescribed for children as young as 3 years for selected conditions, precautions do apply. Overdosage: If you think you have taken too much of this medicine contact a poison control center or emergency room at once. NOTE: This medicine is only for you. Do not share this medicine with others. What if I miss a dose? If you miss a dose, take it as soon as you can. If it is almost time for your next dose, take only that dose. Do not take double or extra doses. What may interact with this medicine? This medicine may interact with the following medications:  alcohol  antihistamines for allergy, cough, and cold  certain medicines for anxiety or sleep  certain medicines for depression like amitriptyline, fluoxetine, sertraline  certain medicines for seizures like phenobarbital, primidone  certain medicines for stomach problems  general anesthetics like halothane, isoflurane, methoxyflurane, propofol  local anesthetics like lidocaine, pramoxine, tetracaine  medicines that relax muscles for surgery  narcotic medicines for pain  phenothiazines like chlorpromazine, mesoridazine, prochlorperazine, thioridazine This list may not describe all possible interactions. Give your health care provider a list of all the medicines, herbs, non-prescription drugs, or dietary supplements you use. Also tell them if you smoke, drink alcohol, or use illegal drugs. Some items may interact with your medicine. What should I watch for while using this medicine? Visit your doctor or health care provider for regular checks on your progress. You may want to keep a record at home  of how you feel your condition is responding to treatment. You may want to share this information with your doctor or health care provider at each visit. You should contact your doctor or health care provider if your seizures get worse or if you have any new types of seizures.  Do not stop taking this medicine or any of your seizure medicines unless instructed by your doctor or health care provider. Stopping your medicine suddenly can increase your seizures or their severity. This medicine may cause serious skin reactions. They can happen weeks to months after starting the medicine. Contact your health care provider right away if you notice fevers or flu-like symptoms with a rash. The rash may be red or purple and then turn into blisters or peeling of the skin. Or, you might notice a red rash with swelling of the face, lips or lymph nodes in your neck or under your arms. Wear a medical identification bracelet or chain if you are taking this medicine for seizures, and carry a card that lists all your medications. You may get drowsy, dizzy, or have blurred vision. Do not drive, use machinery, or do anything that needs mental alertness until you know how this medicine affects you. To reduce dizzy or fainting spells, do not sit or stand up quickly, especially if you are an older patient. Alcohol can increase drowsiness and dizziness. Avoid alcoholic drinks. Your mouth may get dry. Chewing sugarless gum or sucking hard candy, and drinking plenty of water will help. The use of this medicine may increase the chance of suicidal thoughts or actions. Pay special attention to how you are responding while on this medicine. Any worsening of mood, or thoughts of suicide or dying should be reported to your health care provider right away. Women who become pregnant while using this medicine may enroll in the [place] American Antiepileptic Drug Pregnancy Registry by calling [id number]. This registry collects information about the safety of antiepileptic drug use during pregnancy. What side effects may I notice from receiving this medicine? Side effects that you should report to your doctor or health care professional as soon as possible:  allergic reactions like skin rash, itching or hives,  swelling of the face, lips, or tongue  breathing problems  rash, fever, and swollen lymph nodes  redness, blistering, peeling or loosening of the skin, including inside the mouth  suicidal thoughts, mood changes Side effects that usually do not require medical attention (report to your doctor or health care professional if they continue or are bothersome):  dizziness  drowsiness  headache  nausea, vomiting  swelling of ankles, feet, hands  tiredness This list may not describe all possible side effects. Call your doctor for medical advice about side effects. You may report side effects to FDA at 1-800-FDA-1088. Where should I keep my medicine? Keep out of reach of children. This medicine may cause accidental overdose and death if it taken by other adults, children, or pets. Mix any unused medicine with a substance like cat litter or coffee grounds. Then throw the medicine away in a sealed container like a sealed bag or a coffee can with a lid. Do not use the medicine after the expiration date. Store at room temperature between 15 and 30 degrees C (59 and 86 degrees F). NOTE: This sheet is a summary. It may not cover all possible information. If you have questions about this medicine, talk to your doctor, pharmacist, or health care provider.  2020 Elsevier/Gold Standard (2018-10-18 14:16:43)  Electromyoneurogram Electromyoneurogram is a test to check how well your muscles and nerves are working. This procedure includes the combined use of electromyogram (EMG) and nerve conduction study (NCS). EMG is used to look for muscular disorders. NCS, which is also called electroneurogram, measures how well your nerves are controlling your muscles. The procedures are usually done together to check if your muscles and nerves are healthy. If the results of the tests are abnormal, this may indicate disease or injury, such as a neuromuscular disease or peripheral nerve damage. Tell a health care  provider about:  Any allergies you have.  All medicines you are taking, including vitamins, herbs, eye drops, creams, and over-the-counter medicines.  Any problems you or family members have had with anesthetic medicines.  Any blood disorders you have.  Any surgeries you have had.  Any medical conditions you have.  If you have a pacemaker.  Whether you are pregnant or may be pregnant. What are the risks? Generally, this is a safe procedure. However, problems may occur, including:  Infection where the electrodes were inserted.  Bleeding. What happens before the procedure? Medicines Ask your health care provider about:  Changing or stopping your regular medicines. This is especially important if you are taking diabetes medicines or blood thinners.  Taking medicines such as aspirin and ibuprofen. These medicines can thin your blood. Do not take these medicines unless your health care provider tells you to take them.  Taking over-the-counter medicines, vitamins, herbs, and supplements. General instructions  Your health care provider may ask you to avoid: ? Beverages that have caffeine, such as coffee and tea. ? Any products that contain nicotine or tobacco. These products include cigarettes, e-cigarettes, and chewing tobacco. If you need help quitting, ask your health care provider.  Do not use lotions or creams on the same day that you will be having the procedure. What happens during the procedure? For EMG   Your health care provider will ask you to stay in a position so that he or she can access the muscle that will be studied. You may be standing, sitting, or lying down.  You may be given a medicine that numbs the area (local anesthetic).  A very thin needle that has an electrode will be inserted into your muscle.  Another small electrode will be placed on your skin near the muscle.  Your health care provider will ask you to continue to remain still.  The  electrodes will send a signal that tells about the electrical activity of your muscles. You may see this on a monitor or hear it in the room.  After your muscles have been studied at rest, your health care provider will ask you to contract or flex your muscles. The electrodes will send a signal that tells about the electrical activity of your muscles.  Your health care provider will remove the electrodes and the electrode needles when the procedure is finished. The procedure may vary among health care providers and hospitals. For NCS   An electrode that records your nerve activity (recording electrode) will be placed on your skin by the muscle that is being studied.  An electrode that is used as a reference (reference electrode) will be placed near the recording electrode.  A paste or gel will be applied to your skin between the recording electrode and the reference electrode.  Your nerve will be stimulated with a mild shock. Your health care provider will measure how much time it takes for your muscle to  react.  Your health care provider will remove the electrodes and the gel when the procedure is finished. The procedure may vary among health care providers and hospitals. What happens after the procedure?  It is up to you to get the results of your procedure. Ask your health care provider, or the department that is doing the procedure, when your results will be ready.  Your health care provider may: ? Give you medicines for any pain. ? Monitor the insertion sites to make sure that bleeding stops. Summary  Electromyoneurogram is a test to check how well your muscles and nerves are working.  If the results of the tests are abnormal, this may indicate disease or injury.  This is a safe procedure. However, problems may occur, such as bleeding and infection.  Your health care provider will do two tests to complete this procedure. One checks your muscles (EMG) and another checks your  nerves (NCS).  It is up to you to get the results of your procedure. Ask your health care provider, or the department that is doing the procedure, when your results will be ready. This information is not intended to replace advice given to you by your health care provider. Make sure you discuss any questions you have with your health care provider. Document Released: 11/17/2004 Document Revised: 04/02/2018 Document Reviewed: 03/15/2018 Elsevier Patient Education  2020 [employer]

## 2019-05-08 ENCOUNTER — Other Ambulatory Visit: Payer: Self-pay

## 2019-05-08 ENCOUNTER — Ambulatory Visit (INDEPENDENT_AMBULATORY_CARE_PROVIDER_SITE_OTHER): Payer: BC Managed Care – PPO | Admitting: Pharmacist

## 2019-05-08 DIAGNOSIS — E782 Mixed hyperlipidemia: Secondary | ICD-10-CM | POA: Diagnosis not present

## 2019-05-08 MED ORDER — REPATHA SURECLICK 140 MG/ML ~~LOC~~ SOAJ: 140.0000 mg | SUBCUTANEOUS | 11 refills | Status: DC

## 2019-05-08 NOTE — Progress Notes (Signed)
Patient ID: Angel Davies                 DOB: 02-16-1971                    MRN: [id number]     HPI: Angel Davies is a 48 y.o. male patient referred to the lipid clinic by Dr. Gala Romney. PMH is significant for CVA (2018), CAD, CHF (35-40% in 07/2018), MI, HTN, ADHD, obesity, anxiety, substance abuse, and depression. Pt was referred to discuss initiation of PCSK9i therapy.  Patient presents today in good spirits for lipid management for an LDL that is above goal. Patient reports compliance with all meds and minimal side effects; his wife helps him manage his medications. Patient does have some memory loss due to having 3 CVAs. Patient reports genetic disposition that contributes to elevated TGs.   Current Medications: Atorvastatin 80 mg daily, Fenofibrate 160 mg daily   Intolerances: N/A  Risk Factors: CVA, CAD, MI, family history   LDL goal: LDL < 55mg /dL  Diet: Snacks on hummus and pita. Yogurt for breakfast, salad for lunch, whole grain instead of white carbs. Lean meat - mostly chicken. Rarely eats sweets. Drinks water.  Exercise: Attends [employer] 3 days per week for an 1 hour cardio exercise  Family History: Mother - CHF, stroke, colon cancer; Father - stroke, prostate cancer  Social History: former smoker, marijuana and cocaine use, alcohol use (4 drinks/week)  Labs: 04/03/19: TC 164 HDL 38 TG 101 (down from 539) LDL 107 (atorvastatin 80mg  daily, fenofibrate 160mg  daily)  Past Medical History:  Diagnosis Date  . ADHD (attention deficit hyperactivity disorder)   . Anxiety   . CVA (cerebral vascular accident) (HCC) 07/02/2017   "left sided weakness" (07/02/2017)  . Depression   . History of kidney stones     Current Outpatient Medications on File Prior to Visit  Medication Sig Dispense Refill  . amphetamine-dextroamphetamine (ADDERALL) 20 MG tablet Take 20 mg by mouth 3 (three) times daily.    apixaban (ELIQUIS) 5 MG TABS tablet Take 1 tablet (5 mg total) by mouth 2 (two) times  daily. 180 tablet 3  . atorvastatin (LIPITOR) 80 MG tablet Take 1 tablet (80 mg total) by mouth daily at 6 PM. 90 tablet 3  . carvedilol (COREG) 6.25 MG tablet Take 1.5 tablets (9.375 mg total) by mouth 2 (two) times daily. Dosage change 90 tablet 3  . clonazePAM (KLONOPIN) 1 MG tablet Take 1 mg by mouth as needed.    . fenofibrate 160 MG tablet TAKE ONE TABLET BY MOUTH ONE TIME DAILY  90 tablet 0  . folic acid (FOLVITE) 1 MG tablet Take 1 tablet (1 mg total) by mouth daily. 30 tablet 0  . gabapentin (NEURONTIN) 300 MG capsule Take 1 capsule (300 mg total) by mouth 3 (three) times daily as needed. 30 capsule 3  . Multiple Vitamin (MULTIVITAMIN WITH MINERALS) TABS tablet Take 1 tablet by mouth daily. 100 tablet 0  . sacubitril-valsartan (ENTRESTO) 97-103 MG Take 1 tablet by mouth 2 (two) times daily. 60 tablet 5  . spironolactone (ALDACTONE) 25 MG tablet TAKE 1/2 TABLET BY MOUTH DAILY AT BEDTIME  30 tablet 4  . thiamine 100 MG tablet Take 1 tablet (100 mg total) by mouth daily. 30 tablet 0  . traZODone (DESYREL) 50 MG tablet Take 100-150 mg by mouth at bedtime as needed for sleep.     No current facility-administered medications on file prior to visit.  No Known Allergies  Assessment/Plan:  1. Hyperlipidemia - LDL is above the goal of < 55. Start Repatha 140 mg subcutaneously into the stomach every other week. Prior authorization sent and approved. Prescription sent to [facility] and copay card has been activated - patient is aware. Discussed the benefits of PCSK9 inhibitors - lowers LDL by ~60% and has proven cardiovascular benefits with minimal side effects. Discussed the importance of exercise and a low-carb, low-salt diet and encouraged the patient to continue his efforts in his cardio exercises and diet. Recheck fasting labs in 2 months to monitor efficacy of the PCSK9 inhibitor. Of note, provided patient with a discount Eliquis copay card (activated) and Entresto copay to reduce the  cost of his monthly fees.   [name], PharmD PGY1 [facility] [address] [place], [place] [address] Phone: [phone]; Fax: [phone]

## 2019-05-08 NOTE — Patient Instructions (Addendum)
Nice seeing you today!  Will submit paperwork to into your insurance for approval prior to sending prescription to your pharmacy. Will contact you once approved.   Start Praluent or Repatha depending on insurance coverage. You will inject this medication subcutaneously into the stomach every other week. Store unused pens in the fridge.  Continue Atorvastatin 80 mg daily and Fenofribate 160 mg daily.  Continue exercising and eating a low-carb, low-salt diet  Recheck fasting labs on December 14th anytime after 7:30am.   Call the pharmacy clinic with any questions, [phone].   Bring Eliquis copay card to your pharmacy, your copay cost should be $10 a month (this card has been activated). Call the number on the [facility] card to activate it and then bring to pharmacy

## 2019-05-12 ENCOUNTER — Ambulatory Visit (INDEPENDENT_AMBULATORY_CARE_PROVIDER_SITE_OTHER): Payer: BC Managed Care – PPO | Admitting: Orthopedic Surgery

## 2019-05-12 ENCOUNTER — Encounter: Payer: Self-pay | Admitting: Orthopedic Surgery

## 2019-05-12 ENCOUNTER — Ambulatory Visit: Payer: Self-pay

## 2019-05-12 VITALS — Ht 70.0 in | Wt 193.0 lb

## 2019-05-12 DIAGNOSIS — M6702 Short Achilles tendon (acquired), left ankle: Secondary | ICD-10-CM | POA: Diagnosis not present

## 2019-05-12 DIAGNOSIS — M25572 Pain in left ankle and joints of left foot: Secondary | ICD-10-CM | POA: Diagnosis not present

## 2019-05-12 NOTE — Progress Notes (Signed)
Office Visit Note   Patient: Angel Davies           Date of Birth: 02-13-1971           MRN: [id number] Visit Date: 05/12/2019              Requested by: Melvenia Beam, MD [address] [place],  [place] [address] PCP: Cari Caraway, MD  Chief Complaint  Patient presents with  . Left Ankle - Pain  . Left Foot - Pain      HPI: Patient is a 48 year old gentleman who is seen for initial evaluation for left calf pain.  Patient states he is status post a stroke which involved the left side.  Patient states he has pain in the posterior aspect of the left calf.  He complains of burning and tingling when he is ambulating.  No pain at rest.  Assessment & Plan: Visit Diagnoses:  1. Pain in left ankle and joints of left foot   2. Achilles tendon contracture, left     Plan: Patient was given instructions and demonstrated Achilles stretching.  Discussed that with stretching and restoration of the length of the gastrocnemius muscle his symptoms should completely resolve.  Follow-Up Instructions: Return if symptoms worsen or fail to improve.   Ortho Exam  Patient is alert, oriented, no adenopathy, well-dressed, normal affect, normal respiratory effort. Examination patient has an antalgic gait toes off early on the left compared to the right.  Patient has good pulses in the left foot.  With his knee extended he has dorsiflexion only to 0 on the left side dorsiflexion of 30 degrees on the right side.  Patient is tender to palpation over the gastrocnemius muscle the ankle is asymptomatic.  Imaging: Xr Ankle 2 Views Left  Result Date: 05/12/2019 2 view radiographs of the left ankle shows a congruent joint no bony abnormalities no fractures.  No images are attached to the encounter.  Labs: Lab Results  Component Value Date   HGBA1C 6.0 (H) 07/03/2017     Lab Results  Component Value Date   ALBUMIN 3.8 07/07/2017   ALBUMIN 4.4 07/02/2017   ALBUMIN 4.9 01/05/2017    No  results found for: MG No results found for: VD25OH  No results found for: PREALBUMIN CBC EXTENDED Latest Ref Rng & Units 12/17/2017 07/24/2017 07/23/2017  WBC 4.0 - 10.5 K/uL 9.4 11.1(H) 10.8(H)  RBC 4.22 - 5.81 MIL/uL 5.14 4.95 5.09  HGB 13.0 - 17.0 g/dL 15.1 14.9 15.5  HCT 39.0 - 52.0 % 47.1 45.3 46.7  PLT 150 - 400 K/uL 362 313 336  NEUTROABS 1.7 - 7.7 K/uL - - -  LYMPHSABS 0.7 - 4.0 K/uL - - -     Body mass index is 27.69 kg/m.  Orders:  Orders Placed This Encounter  Procedures  . XR Ankle 2 Views Left   No orders of the defined types were placed in this encounter.    Procedures: No procedures performed  Clinical Data: No additional findings.  ROS:  All other systems negative, except as noted in the HPI. Review of Systems  Objective: Vital Signs: Ht 5\' 10"  (1.778 m)   Wt 193 lb (87.5 kg)   BMI 27.69 kg/m   Specialty Comments:  No specialty comments available.  PMFS History: Patient Active Problem List   Diagnosis Date Noted  . 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 Chronic systolic heart failure ([place]) 08/07/2017  . Encounter for therapeutic drug monitoring 07/27/2017  .  Muscle pain   . Supratherapeutic INR   . Leukocytosis   . Coronary artery disease involving native coronary artery of native heart without angina pectoris   . Prediabetes   . Hemorrhoids   . 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 Left hemiparesis (HCC)   . Cardiomyopathy, ischemic   . 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 Myocardiopathy (HCC)   . Hyperlipidemia   . CVA (cerebral vascular accident) (HCC) 07/02/2017  . Alcohol use 07/02/2017  . Anxiety 07/02/2017  . Depression 07/02/2017  . Acute hyperglycemia 07/02/2017  . Elevated blood-pressure reading without diagnosis of hypertension 07/02/2017  . ADHD 07/02/2017  . Cocaine abuse (HCC) 07/02/2017  . Marijuana abuse 06/20/2017  . Cocaine use 06/20/2017   Past Medical History:  Diagnosis Date  .  ADHD (attention deficit hyperactivity disorder)   . Anxiety   . CVA (cerebral vascular accident) (HCC) 07/02/2017   "left sided weakness" (07/02/2017)  . Depression   . History of kidney stones     Family History  Problem Relation Age of Onset  . Stroke Mother   . Colon cancer Mother   . Prostate cancer Father   . Seizures Neg Hx     Past Surgical History:  Procedure Laterality Date  . CYSTOSCOPY WITH URETEROSCOPY, STONE BASKETRY AND STENT PLACEMENT    . RIGHT/LEFT HEART CATH AND CORONARY ANGIOGRAPHY N/A 07/05/2017   Procedure: RIGHT/LEFT HEART CATH AND CORONARY ANGIOGRAPHY;  Surgeon: Dolores Patty, MD;  Location: [facility];  Service: Cardiovascular;  Laterality: N/A;   Social History   Occupational History  . Not on file  Tobacco Use  . Smoking status: Former Smoker    Packs/day: 0.00    Years: 0.00    Pack years: 0.00    Types: Cigarettes, E-cigarettes  . Smokeless tobacco: Former Engineer, water and Sexual Activity  . Alcohol use: Yes    Alcohol/week: 1.0 standard drinks    Types: 1 Glasses of wine per week    Comment: not weekly, social   . Drug use: Yes    Frequency: 7.0 times per week    Types: Marijuana    Comment: 07/02/2017 "smokes marijuana a couple days/week" , "08/20/17 vapes marijuana juice"  . Sexual activity: Yes

## 2019-06-17 DIAGNOSIS — Z23 Encounter for immunization: Secondary | ICD-10-CM | POA: Diagnosis not present

## 2019-06-17 DIAGNOSIS — Z0000 Encounter for general adult medical examination without abnormal findings: Secondary | ICD-10-CM | POA: Diagnosis not present

## 2019-07-03 ENCOUNTER — Ambulatory Visit (INDEPENDENT_AMBULATORY_CARE_PROVIDER_SITE_OTHER): Payer: BC Managed Care – PPO | Admitting: Neurology

## 2019-07-03 ENCOUNTER — Ambulatory Visit: Payer: BC Managed Care – PPO | Admitting: Neurology

## 2019-07-03 ENCOUNTER — Encounter: Payer: Self-pay | Admitting: Neurology

## 2019-07-03 ENCOUNTER — Other Ambulatory Visit: Payer: Self-pay

## 2019-07-03 DIAGNOSIS — M79672 Pain in left foot: Secondary | ICD-10-CM | POA: Diagnosis not present

## 2019-07-03 DIAGNOSIS — Z0289 Encounter for other administrative examinations: Secondary | ICD-10-CM

## 2019-07-03 NOTE — Progress Notes (Signed)
Full Name: Angel Davies Gender: Male MRN #: [id number] Date of Birth: 1971/01/26    Visit Date: 07/03/2019 10:35 Age: 48 Years Examining Physician: Sarina Ill, MD  Referring Physician: Theadore Nan MD  History: 48 year old with ankle and heel tightness and nerve pain. He  States he "came down" very hard on his heel one day and since then he has shooting pain from his heel that is severe mostly when walking.   Summary: EMG performed on the left lower extremity. All nerves and muscles (as indicated in the following tables) were within normal limits.    Conclusion: This is a normal study.   Sarina Ill M.D.  [facility] [place], [place] [address] Tel: [phone] Fax: [phone]         [facility]    Nerve / Sites Muscle Latency Ref. Amplitude Ref. Rel Amp Segments Distance Velocity Ref. Area    ms ms mV mV %  cm m/s m/s mVms  L Peroneal - EDB     Ankle EDB 6.2 ?6.5 6.4 ?2.0 100 Ankle - EDB 9   23.7     Fib head EDB 12.8  5.7  89 Fib head - Ankle 29 44 ?44 22.7     Pop fossa EDB 15.1  5.3  93.7 Pop fossa - Fib head 10 44 ?44 22.3         Pop fossa - Ankle      L Tibial - AH     Ankle AH 4.2 ?5.8 7.2 ?4.0 100 Ankle - AH 9   21.8     Pop fossa AH 13.5  6.3  87 Pop fossa - Ankle 38 41 ?41 20.4         SNC    Nerve / Sites Rec. Site Peak Lat Ref.  Amp Ref. Segments Distance Peak Diff Ref.    ms ms V V  cm ms ms  L Sural - Ankle (Calf)     Calf Ankle 4.3 ?4.4 23 ?6 Calf - Ankle 14    L Superficial peroneal - Ankle     Lat leg Ankle 4.3 ?4.4 9 ?6 Lat leg - Ankle 14    L Medial plantar, Lateral plantar - Ankle (Medial, lateral sole)     Medial plantar Sole Ankle 4.2 ?5.9 12 ?3 Medial plantar Sole - Ankle 14       Lateral plantar Sole Ankle 3.9 ?5.9 9 ?3 Lateral plantar Sole - Ankle 14          Medial plantar Sole - Lateral plantar Sole     R Medial plantar, Lateral plantar - Ankle (Medial, lateral sole)     Medial plantar Sole  Ankle 3.9 ?5.9 16 ?3 Medial plantar Sole - Ankle 14       Lateral plantar Sole Ankle 3.8 ?5.9 5 ?3 Lateral plantar Sole - Ankle 14          Medial plantar Sole - Lateral plantar Sole                F  Wave    Nerve F Lat Ref.   ms ms  L Tibial - AH 54.9 ?56.0       EMG Summary Table    Spontaneous MUAP Recruitment  Muscle IA Fib PSW Fasc Other Amp Dur. Poly Pattern  L. Vastus medialis Normal None None None _______ Normal Normal Normal Normal  L. Tibialis anterior Normal None  None None _______ Normal Normal Normal Normal  L. Gastrocnemius (Medial head) Normal None None None _______ Normal Normal Normal Normal  L. Extensor hallucis longus Normal None None None _______ Normal Normal Normal Normal  L. Abductor hallucis Normal None None None _______ Normal Normal Normal Normal  L. Abductor digiti minimi (manus) Normal None None None _______ Normal Normal Normal Normal

## 2019-07-05 NOTE — Progress Notes (Signed)
See procedure note.

## 2019-07-05 NOTE — Progress Notes (Signed)
I had a long conversation with patient. I do not doubt he has severe pain but emg/ncs was non-localizing. He expressed interest in pain management but given his history of cocaine abuse and his current medical conditions I advised it would be best to see a pain specialist and follow pain protocols that we all have to abide by such as pain contract, screening for abuse and depression, regular urine checks and other precautions to keep him safe. I will reach out to Dr. Micheline Chapman or Dr. Sharol Given and see if he could perform steroid or other injections into patients heel to see if that would help. Patient agreed only if he could have injection when he saw physician, he states he is tired of going to multiple doctors, but he likely needs to be evaluated first.   A total of 15 minutes was spent face-to-face with this patient. Over half this time was spent on counseling patient on the  1. Pain of right heel    diagnosis and different diagnostic and therapeutic options, counseling and coordination of care, risks ans benefits of management, compliance, or risk factor reduction and education.

## 2019-07-08 NOTE — Procedures (Signed)
Full Name: Angel Davies Gender: Male MRN #: [id number] Date of Birth: 1971/01/26    Visit Date: 07/03/2019 10:35 Age: 48 Years Examining Physician: Sarina Ill, MD  Referring Physician: Theadore Nan MD  History: 48 year old with ankle and heel tightness and nerve pain. He  States he "came down" very hard on his heel one day and since then he has shooting pain from his heel that is severe mostly when walking.   Summary: EMG performed on the left lower extremity. All nerves and muscles (as indicated in the following tables) were within normal limits.    Conclusion: This is a normal study.   Sarina Ill M.D.  [facility] [place], [place] [address] Tel: [phone] Fax: [phone]         [facility]    Nerve / Sites Muscle Latency Ref. Amplitude Ref. Rel Amp Segments Distance Velocity Ref. Area    ms ms mV mV %  cm m/s m/s mVms  L Peroneal - EDB     Ankle EDB 6.2 ?6.5 6.4 ?2.0 100 Ankle - EDB 9   23.7     Fib head EDB 12.8  5.7  89 Fib head - Ankle 29 44 ?44 22.7     Pop fossa EDB 15.1  5.3  93.7 Pop fossa - Fib head 10 44 ?44 22.3         Pop fossa - Ankle      L Tibial - AH     Ankle AH 4.2 ?5.8 7.2 ?4.0 100 Ankle - AH 9   21.8     Pop fossa AH 13.5  6.3  87 Pop fossa - Ankle 38 41 ?41 20.4         SNC    Nerve / Sites Rec. Site Peak Lat Ref.  Amp Ref. Segments Distance Peak Diff Ref.    ms ms V V  cm ms ms  L Sural - Ankle (Calf)     Calf Ankle 4.3 ?4.4 23 ?6 Calf - Ankle 14    L Superficial peroneal - Ankle     Lat leg Ankle 4.3 ?4.4 9 ?6 Lat leg - Ankle 14    L Medial plantar, Lateral plantar - Ankle (Medial, lateral sole)     Medial plantar Sole Ankle 4.2 ?5.9 12 ?3 Medial plantar Sole - Ankle 14       Lateral plantar Sole Ankle 3.9 ?5.9 9 ?3 Lateral plantar Sole - Ankle 14          Medial plantar Sole - Lateral plantar Sole     R Medial plantar, Lateral plantar - Ankle (Medial, lateral sole)     Medial plantar Sole  Ankle 3.9 ?5.9 16 ?3 Medial plantar Sole - Ankle 14       Lateral plantar Sole Ankle 3.8 ?5.9 5 ?3 Lateral plantar Sole - Ankle 14          Medial plantar Sole - Lateral plantar Sole                F  Wave    Nerve F Lat Ref.   ms ms  L Tibial - AH 54.9 ?56.0       EMG Summary Table    Spontaneous MUAP Recruitment  Muscle IA Fib PSW Fasc Other Amp Dur. Poly Pattern  L. Vastus medialis Normal None None None _______ Normal Normal Normal Normal  L. Tibialis anterior Normal None  None None _______ Normal Normal Normal Normal  L. Gastrocnemius (Medial head) Normal None None None _______ Normal Normal Normal Normal  L. Extensor hallucis longus Normal None None None _______ Normal Normal Normal Normal  L. Abductor hallucis Normal None None None _______ Normal Normal Normal Normal  L. Abductor digiti minimi (manus) Normal None None None _______ Normal Normal Normal Normal

## 2019-07-14 ENCOUNTER — Other Ambulatory Visit: Payer: BC Managed Care – PPO

## 2019-07-14 ENCOUNTER — Other Ambulatory Visit: Payer: Self-pay

## 2019-07-14 DIAGNOSIS — E782 Mixed hyperlipidemia: Secondary | ICD-10-CM

## 2019-07-15 ENCOUNTER — Encounter: Payer: Self-pay | Admitting: Orthopedic Surgery

## 2019-07-15 ENCOUNTER — Ambulatory Visit: Payer: BC Managed Care – PPO | Admitting: Orthopedic Surgery

## 2019-07-15 VITALS — Ht 70.0 in | Wt 193.0 lb

## 2019-07-15 DIAGNOSIS — M25572 Pain in left ankle and joints of left foot: Secondary | ICD-10-CM | POA: Diagnosis not present

## 2019-07-16 ENCOUNTER — Encounter: Payer: Self-pay | Admitting: Orthopedic Surgery

## 2019-07-16 NOTE — Progress Notes (Signed)
Office Visit Note   Patient: Angel Davies           Date of Birth: [birth year]           MRN: [id number] Visit Date: 07/15/2019              Requested by: Cari Caraway, [place],  [place] [address] PCP: Cari Caraway, MD  Chief Complaint  Patient presents with  . Left Foot - Pain, Follow-up  . Left Ankle - Pain, Follow-up      HPI: The patient presents today in follow-up for his left foot and ankle pain.  He is not sure why he is really here and has had 3 strokes since his last visit and spent a month in the hospital he says his memory is not very good and he just had an alert on his phone to follow-up his ankle really is not bothering him much occasionally he gets some sharp pain in his Achilles in the back of his ankle this is very infrequent and he really does not seem to be bothered by it he currently has no ankle pain itself  Assessment & Plan: Visit Diagnoses: No diagnosis found.  Plan: We reviewed the Achilles stretching he may follow-up as needed  Follow-Up Instructions: No follow-ups on file.   Ortho Exam  Patient is alert, oriented, no adenopathy, well-dressed, normal affect, normal respiratory effort. Left ankle and foot: No soft tissue swelling full range of motion is dorsiflexion is actually quite good no tenderness over the Achilles no tenderness over the medial or lateral ankle joint distal CMS is intact  Imaging: No results found. No images are attached to the encounter.  Labs: Lab Results  Component Value Date   HGBA1C 6.0 (H) 07/03/2017     Lab Results  Component Value Date   ALBUMIN 3.8 07/07/2017   ALBUMIN 4.4 07/02/2017   ALBUMIN 4.9 01/05/2017    No results found for: MG No results found for: VD25OH  No results found for: PREALBUMIN CBC EXTENDED Latest Ref Rng & Units 12/17/2017 07/24/2017 07/23/2017  WBC 4.0 - 10.5 K/uL 9.4 11.1(H) 10.8(H)  RBC 4.22 - 5.81 MIL/uL 5.14 4.95 5.09  HGB 13.0 - 17.0 g/dL 15.1 14.9  15.5  HCT 39.0 - 52.0 % 47.1 45.3 46.7  PLT 150 - 400 K/uL 362 313 336  NEUTROABS 1.7 - 7.7 K/uL - - -  LYMPHSABS 0.7 - 4.0 K/uL - - -     Body mass index is 27.69 kg/m.  Orders:  No orders of the defined types were placed in this encounter.  No orders of the defined types were placed in this encounter.    Procedures: No procedures performed  Clinical Data: No additional findings.  ROS:  All other systems negative, except as noted in the HPI. Review of Systems  Objective: Vital Signs: Ht 5\' 10"  (1.778 m)   Wt 193 lb (87.5 kg)   BMI 27.69 kg/m   Specialty Comments:  No specialty comments available.  PMFS History: Patient Active Problem List   Diagnosis Date Noted  . 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 Chronic systolic heart failure ([place]) 08/07/2017  . Encounter for therapeutic drug monitoring 07/27/2017  . Muscle pain   . Supratherapeutic INR   . Leukocytosis   . Coronary artery disease involving native coronary artery of native heart without angina pectoris   . Prediabetes   . Hemorrhoids   . Right pontine stroke ([place]) 07/06/2017  .  Left hemiparesis (HCC)   . Cardiomyopathy, ischemic   . 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 Myocardiopathy (HCC)   . Hyperlipidemia   . CVA (cerebral vascular accident) (HCC) 07/02/2017  . Alcohol use 07/02/2017  . Anxiety 07/02/2017  . Depression 07/02/2017  . Acute hyperglycemia 07/02/2017  . Elevated blood-pressure reading without diagnosis of hypertension 07/02/2017  . ADHD 07/02/2017  . Cocaine abuse (HCC) 07/02/2017  . Marijuana abuse 06/20/2017  . Cocaine use 06/20/2017   Past Medical History:  Diagnosis Date  . ADHD (attention deficit hyperactivity disorder)   . Anxiety   . CVA (cerebral vascular accident) (HCC) 07/02/2017   "left sided weakness" (07/02/2017)  . Depression   . History of kidney stones     Family History  Problem Relation Age of Onset  . Stroke Mother   . Colon  cancer Mother   . Prostate cancer Father   . Seizures Neg Hx     Past Surgical History:  Procedure Laterality Date  . CYSTOSCOPY WITH URETEROSCOPY, STONE BASKETRY AND STENT PLACEMENT    . RIGHT/LEFT HEART CATH AND CORONARY ANGIOGRAPHY N/A 07/05/2017   Procedure: RIGHT/LEFT HEART CATH AND CORONARY ANGIOGRAPHY;  Surgeon: Dolores Patty, MD;  Location: [facility]IVE [facility] LAB;  Service: Cardiovascular;  Laterality: N/A;   Social History   Occupational History  . Not on file  Tobacco Use  . Smoking status: Former Smoker    Packs/day: 0.00    Years: 0.00    Pack years: 0.00    Types: Cigarettes, E-cigarettes  . Smokeless tobacco: Former Engineer, water and Sexual Activity  . Alcohol use: Yes    Alcohol/week: 1.0 standard drinks    Types: 1 Glasses of wine per week    Comment: not weekly, social   . Drug use: Yes    Frequency: 7.0 times per week    Types: Marijuana    Comment: 07/02/2017 "smokes marijuana a couple days/week" , "08/20/17 vapes marijuana juice"  . Sexual activity: Yes

## 2019-07-17 ENCOUNTER — Telehealth: Payer: Self-pay | Admitting: Pharmacist

## 2019-07-17 NOTE — Telephone Encounter (Signed)
Called patient and reviewed his cholesterol labs. LDL is wonderful- 9 TG 102. Continue Repatha 140mg  q 14 days and atorvastatin 80mg  daily. Patient appreciative of the call

## 2019-08-04 ENCOUNTER — Other Ambulatory Visit (HOSPITAL_COMMUNITY): Payer: Self-pay | Admitting: Internal Medicine

## 2019-08-25 DIAGNOSIS — I2510 Atherosclerotic heart disease of native coronary artery without angina pectoris: Secondary | ICD-10-CM | POA: Diagnosis not present

## 2019-08-25 DIAGNOSIS — I6930 Unspecified sequelae of cerebral infarction: Secondary | ICD-10-CM | POA: Diagnosis not present

## 2019-08-25 DIAGNOSIS — Z800 Family history of malignant neoplasm of digestive organs: Secondary | ICD-10-CM | POA: Diagnosis not present

## 2019-08-25 DIAGNOSIS — I5022 Chronic systolic (congestive) heart failure: Secondary | ICD-10-CM | POA: Diagnosis not present

## 2019-09-10 ENCOUNTER — Telehealth (HOSPITAL_COMMUNITY): Payer: Self-pay | Admitting: *Deleted

## 2019-09-10 NOTE — Telephone Encounter (Signed)
Received fax from [facility], pt needs clearance for colonoscopy w/propofol and ok to hold Eliquis  Per Dr Gala Romney:  "Ok to stop Eliquis x2 days.  Restart ASAP"  Note faxed back to them at [phone]

## 2019-09-12 DIAGNOSIS — F410 Panic disorder [episodic paroxysmal anxiety] without agoraphobia: Secondary | ICD-10-CM | POA: Diagnosis not present

## 2019-09-12 DIAGNOSIS — F900 Attention-deficit hyperactivity disorder, predominantly inattentive type: Secondary | ICD-10-CM | POA: Diagnosis not present

## 2019-09-12 DIAGNOSIS — F3342 Major depressive disorder, recurrent, in full remission: Secondary | ICD-10-CM | POA: Diagnosis not present

## 2019-09-15 ENCOUNTER — Other Ambulatory Visit (HOSPITAL_COMMUNITY): Payer: Self-pay

## 2019-09-15 ENCOUNTER — Other Ambulatory Visit (HOSPITAL_COMMUNITY): Payer: Self-pay | Admitting: Internal Medicine

## 2019-09-15 ENCOUNTER — Telehealth (HOSPITAL_COMMUNITY): Payer: Self-pay | Admitting: Licensed Clinical Social Worker

## 2019-09-15 MED ORDER — CARVEDILOL 6.25 MG PO TABS: ORAL_TABLET | ORAL | 3 refills | Status: DC

## 2019-09-15 NOTE — Telephone Encounter (Signed)
CSW received call from patient with medication concerns.  Pt states he is almost out of his carvedilol but that he has not been able to get to anyone in the office to request refill be sent in.  CSW informed clinic staff who sent in refill for patient.  Pt also due for 6 month follow up so CSW assisted in scheduling pt and sent him appt information.  CSW will continue to follow and assist as needed  Burna Sis, LCSW Clinical Social Worker [facility] Desk#: [phone] Cell#: [phone]

## 2019-10-09 ENCOUNTER — Other Ambulatory Visit (HOSPITAL_COMMUNITY): Payer: Self-pay | Admitting: Internal Medicine

## 2019-11-03 ENCOUNTER — Other Ambulatory Visit (HOSPITAL_COMMUNITY): Payer: Self-pay | Admitting: Internal Medicine

## 2019-11-06 ENCOUNTER — Other Ambulatory Visit: Payer: Self-pay

## 2019-11-06 ENCOUNTER — Encounter (HOSPITAL_COMMUNITY): Payer: Self-pay | Admitting: Internal Medicine

## 2019-11-06 ENCOUNTER — Ambulatory Visit (HOSPITAL_COMMUNITY)
Admission: RE | Admit: 2019-11-06 | Discharge: 2019-11-06 | Disposition: A | Discharge status: Home / Self Care | Payer: BC Managed Care – PPO | Source: Ambulatory Visit | Attending: Internal Medicine | Admitting: Internal Medicine

## 2019-11-06 VITALS — BP 122/78 | HR 91 | Wt 198.2 lb

## 2019-11-06 DIAGNOSIS — Z87891 Personal history of nicotine dependence: Secondary | ICD-10-CM | POA: Insufficient documentation

## 2019-11-06 DIAGNOSIS — F329 Major depressive disorder, single episode, unspecified: Secondary | ICD-10-CM | POA: Insufficient documentation

## 2019-11-06 DIAGNOSIS — Z8673 Personal history of transient ischemic attack (TIA), and cerebral infarction without residual deficits: Secondary | ICD-10-CM | POA: Insufficient documentation

## 2019-11-06 DIAGNOSIS — F419 Anxiety disorder, unspecified: Secondary | ICD-10-CM | POA: Insufficient documentation

## 2019-11-06 DIAGNOSIS — I639 Cerebral infarction, unspecified: Secondary | ICD-10-CM | POA: Diagnosis not present

## 2019-11-06 DIAGNOSIS — I2510 Atherosclerotic heart disease of native coronary artery without angina pectoris: Secondary | ICD-10-CM | POA: Diagnosis not present

## 2019-11-06 DIAGNOSIS — E669 Obesity, unspecified: Secondary | ICD-10-CM | POA: Insufficient documentation

## 2019-11-06 DIAGNOSIS — I255 Ischemic cardiomyopathy: Secondary | ICD-10-CM | POA: Diagnosis not present

## 2019-11-06 DIAGNOSIS — Z8616 Personal history of COVID-19: Secondary | ICD-10-CM | POA: Insufficient documentation

## 2019-11-06 DIAGNOSIS — F1410 Cocaine abuse, uncomplicated: Secondary | ICD-10-CM | POA: Insufficient documentation

## 2019-11-06 DIAGNOSIS — I5022 Chronic systolic (congestive) heart failure: Secondary | ICD-10-CM

## 2019-11-06 DIAGNOSIS — M79606 Pain in leg, unspecified: Secondary | ICD-10-CM | POA: Diagnosis not present

## 2019-11-06 DIAGNOSIS — Z7901 Long term (current) use of anticoagulants: Secondary | ICD-10-CM | POA: Insufficient documentation

## 2019-11-06 DIAGNOSIS — E782 Mixed hyperlipidemia: Secondary | ICD-10-CM | POA: Diagnosis not present

## 2019-11-06 DIAGNOSIS — F909 Attention-deficit hyperactivity disorder, unspecified type: Secondary | ICD-10-CM | POA: Insufficient documentation

## 2019-11-06 DIAGNOSIS — E785 Hyperlipidemia, unspecified: Secondary | ICD-10-CM | POA: Diagnosis not present

## 2019-11-06 DIAGNOSIS — I110 Hypertensive heart disease with heart failure: Secondary | ICD-10-CM | POA: Insufficient documentation

## 2019-11-06 DIAGNOSIS — F1010 Alcohol abuse, uncomplicated: Secondary | ICD-10-CM | POA: Diagnosis not present

## 2019-11-06 DIAGNOSIS — Z79899 Other long term (current) drug therapy: Secondary | ICD-10-CM | POA: Insufficient documentation

## 2019-11-06 MED ORDER — CARVEDILOL 12.5 MG PO TABS: 12.5000 mg | ORAL_TABLET | Freq: Two times a day (BID) | ORAL | 11 refills | Status: DC

## 2019-11-06 NOTE — Progress Notes (Signed)
[facility] Note   Primary Care: Dr. Susa Simmonds Primary Cardiologist: Dr. Gala Romney  HPI:  Angel Davies is a 49 y.o. male with PMH of HTN, ADHD, Obesity, anxiety, substance abuse, and depression.   Pt presented 07/02/2017 with CVA. UDS + for cocaine. Found to have new systolic CHF by echo.   Echo 07/02/17 with LVEF 20-25%, grade 2 DD, Trivial MR, Trivial TR -> HF team consulted.  With new CHF, pt underwent R/LHC as below that showed severe 3vD with no options for CABG/CAD. HF meds titrated as tolerated.  Admitted to CIR from 12/7 - 07/27/17 with marked improvement. Med titration somewhat limited due to soft pressures.   He presents today for HF follow up. Had COVID-19 over the summer and recovered.  Here for f/u. Doing quite well. [facility] buddy got Cov[facility]. Anxious to get back. Has been doing cardio for 1 hour 3x/week. Asking how much longer he has to take repatha. Denies CP or SOB. No edema, orthopnea or PND. Having episodes of neuropathic pain in leg.    Echo 12/17/17 EF 30-35% No apical thrombus.  Echo 08/02/18 EF 35-40%.  Review of systems complete and found to be negative unless listed in HPI.   cMRI on 07/06/17 with 1) Severe LVE with EF 18% septal and apical akinesis diffuse hypokinesis 2) Apical thrombus measuring 11 mm x 8 mm 3) Full thickness scar involving the mid and distal septum and apex Subendocardial scar involving the basal inferior lateral wall  R/LHC 07/05/17  Mid RCA to Dist RCA lesion is 100% stenosed.  Dist Cx lesion is 100% stenosed.  Mid LAD lesion is 100% stenosed.  Prox Cx to Dist Cx lesion is 30% stenosed.  Findings: Ao = 104/68 (84) LV = 110/9 RA = 1 RV = 19/3 PA = 19/9 (11) PCW = 5 Fick cardiac output/index = 4.3/2.1 PVR = 1.5 WU SVR = 2122 Ao sat = 96% PA sat = 64%, 66%  Assessment: 1. Severe CAD with left-dominant system and occluded mLAD 2. Ischemic CM with EF 25% by echo 3. Well  compensated filling pressures with moderately reduced cardiac output  Past Medical History:  Diagnosis Date  . ADHD (attention deficit hyperactivity disorder)   . Anxiety   . CVA (cerebral vascular accident) (HCC) 07/02/2017   "left sided weakness" (07/02/2017)  . Depression   . History of kidney stones    Current Outpatient Medications  Medication Sig Dispense Refill  . amphetamine-dextroamphetamine (ADDERALL) 20 MG tablet Take 20 mg by mouth 3 (three) times daily.    Marland Kitchen apixaban (ELIQUIS) 5 MG TABS tablet Take 1 tablet (5 mg total) by mouth 2 (two) times daily. 180 tablet 3  . atorvastatin (LIPITOR) 80 MG tablet Take 1 tablet (80 mg total) by mouth daily at 6 PM. 90 tablet 3  . carvedilol (COREG) 6.25 MG tablet TAKE ONE AND ONE-HALF TABLETS BY MOUTH TWICE DAILY *this is a dose change* 90 tablet 3  . clonazePAM (KLONOPIN) 1 MG tablet Take 1 mg by mouth as needed.    Marland Kitchen ENTRESTO 97-103 MG TAKE ONE TABLET BY MOUTH TWICE DAILY  60 tablet 0  . Evolocumab (REPATHA SURECLICK) 140 MG/ML SOAJ Inject 140 mg into the skin every 14 (fourteen) days. 2 pen 11  . fenofibrate 160 MG tablet TAKE ONE TABLET BY MOUTH ONE TIME DAILY  90 tablet 0  . folic acid (FOLVITE) 1 MG tablet Take 1 tablet (1 mg total)  by mouth daily. 30 tablet 0  . gabapentin (NEURONTIN) 300 MG capsule Take 1 capsule (300 mg total) by mouth 3 (three) times daily as needed. 30 capsule 3  . Multiple Vitamin (MULTIVITAMIN WITH MINERALS) TABS tablet Take 1 tablet by mouth daily. 100 tablet 0  . spironolactone (ALDACTONE) 25 MG tablet TAKE HALF TABLET BY MOUTH AT BEDTIME  45 tablet 3  . thiamine 100 MG tablet Take 1 tablet (100 mg total) by mouth daily. 30 tablet 0  . traZODone (DESYREL) 50 MG tablet Take 100-150 mg by mouth at bedtime as needed for sleep.     No current facility-administered medications for this encounter.   No Known Allergies  Social History   Socioeconomic History  . Marital status: Married    Spouse name: Not on  file  . Number of children: Not on file  . Years of education: Not on file  . Highest education level: Not on file  Occupational History  . Not on file  Tobacco Use  . Smoking status: Former Smoker    Packs/day: 0.00    Years: 0.00    Pack years: 0.00    Types: Cigarettes, E-cigarettes  . Smokeless tobacco: Former Engineer, water and Sexual Activity  . Alcohol use: Yes    Alcohol/week: 1.0 standard drinks    Types: 1 Glasses of wine per week    Comment: not weekly, social   . Drug use: Yes    Frequency: 7.0 times per week    Types: Marijuana    Comment: 07/02/2017 "smokes marijuana a couple days/week" , "08/20/17 vapes marijuana juice"  . Sexual activity: Yes  Other Topics Concern  . Not on file  Social History Narrative   Lives at home with wife and his father lives with them   Right handed   Social Determinants of Health   Financial Resource Strain:   . Difficulty of Paying Living Expenses:   Food Insecurity:   . Worried About Programme researcher, broadcasting/film/video in the Last Year:   . Barista in the Last Year:   Transportation Needs:   . Freight forwarder (Medical):   Marland Kitchen Lack of Transportation (Non-Medical):   Physical Activity:   . Days of Exercise per Week:   . Minutes of Exercise per Session:   Stress:   . Feeling of Stress :   Social Connections:   . Frequency of Communication with Friends and Family:   . Frequency of Social Gatherings with Friends and Family:   . Attends Religious Services:   . Active Member of Clubs or Organizations:   . Attends Banker Meetings:   Marland Kitchen Marital Status:   Intimate Partner Violence:   . Fear of Current or Ex-Partner:   . Emotionally Abused:   Marland Kitchen Physically Abused:   . Sexually Abused:    Family History  Problem Relation Age of Onset  . Stroke Mother   . Colon cancer Mother   . Prostate cancer Father   . Seizures Neg Hx    Vitals:   11/06/19 1052  BP: 122/78  Pulse: 91  SpO2: 96%  Weight: 89.9 kg (198 lb 3.2  oz)   Wt Readings from Last 3 Encounters:  11/06/19 89.9 kg (198 lb 3.2 oz)  07/15/19 87.5 kg (193 lb)  05/12/19 87.5 kg (193 lb)   PHYSICAL EXAM: General:  Well appearing. No resp difficulty HEENT: normal Neck: supple. no JVD. Carotids 2+ bilat; no bruits. No lymphadenopathy or thryomegaly  appreciated. Cor: PMI nondisplaced. Regular rate & rhythm. No rubs, gallops or murmurs. Lungs: clear Abdomen: soft, nontender, nondistended. No hepatosplenomegaly. No bruits or masses. Good bowel sounds. Extremities: no cyanosis, clubbing, rash, edema Neuro: alert & orientedx3, cranial nerves grossly intact. moves all 4 extremities w/o difficulty. Affect pleasant  ECG: NSR 89 No ST-T wave abnormalities. Personally reviewed   ASSESSMENT & PLAN:  1. Chronic systolic CHF, ICM - Echo 04/02/80 with LVEF 20-25%, grade 2 DD, Trivial MR, Trivial TR - cMRI 12/18 EF 18% - EF 12/17/17 ~35% range. Echo 08/02/18: EF 35-40%. Now out of ICD range.  - Continues to do well. NYHA I-II.  - Volume status looks good. Does not use lasix.  - Continue Entresto 97/103 mg BID. BMET today.  - Increase carvedilol to 12.5 mg BID.  - Continue spiro 12.5 daily; last K was 4.6 in 9/20 - Repeat echo 3. CAD - LHC 07/05/17 with severe CAD with left-dominant system and occluded mLAD. - Mostly distal CAD. No good targets for CABG or PCI. Continue medical therapy. Based on scar pattern on MRI  - Continue statin/repatha. No ASA with Eliquis - No s/s ischemia  3. CVA - MRI Head 07/02/17 R pontine infarct and sub acute R temporal occipital/PCA territory infarct, along with old left occipital lobe/PCA territory infarct. - No bleeding on Elipquis - If cardiac apex improves can consider switching to ASA/Plavix - Minimal residual 4. Cocaine abuse - Remains abstinent 5. Alcohol abuse - Encouraged responsible moderation. No change.  6. HLD - Continue atorvastatin 80 mg daily and repatha.  - Getting Repatha. PCP following lipids -  Last LDL 67  Glori Bickers, MD 11/06/19 11:19 AM

## 2019-11-06 NOTE — Patient Instructions (Signed)
Increase Carvedilol to 12.5 mg Twice daily   Please call our office in January 2022 to schedule that appointment  If you have any questions or concerns before your next appointment please send [place] a message through [place] or call our office at [phone].

## 2019-11-07 ENCOUNTER — Telehealth (HOSPITAL_COMMUNITY): Payer: Self-pay | Admitting: Licensed Clinical Social Worker

## 2019-11-07 NOTE — Telephone Encounter (Signed)
CSW received call from pt requesting help in obtaining letter from MD stating that he will likely be disabled Long Term.  CSW sent message to clinic RN to assist.  CSW will continue to follow and assist as needed  Burna Sis, LCSW Clinical Social Worker [facility] Desk#: [phone] Cell#: [phone]

## 2019-11-14 ENCOUNTER — Telehealth (HOSPITAL_COMMUNITY): Payer: Self-pay | Admitting: Licensed Clinical Social Worker

## 2019-11-14 NOTE — Telephone Encounter (Signed)
CSW informed by clinic RN that pt would not meet criteria for long term disability for his cardiac concerns at this time because his EF was improving and is currently 35-40%.  CSW relayed this information to the pt who expressed understanding.  CSW will continue to follow and assist as needed  Burna Sis, LCSW Clinical Social Worker [facility] Desk#: [phone] Cell#: [phone]

## 2019-12-09 ENCOUNTER — Other Ambulatory Visit (HOSPITAL_COMMUNITY): Payer: Self-pay | Admitting: Internal Medicine

## 2020-03-04 ENCOUNTER — Other Ambulatory Visit (HOSPITAL_COMMUNITY): Payer: Self-pay | Admitting: Internal Medicine

## 2020-03-04 ENCOUNTER — Telehealth (HOSPITAL_COMMUNITY): Payer: Self-pay | Admitting: Licensed Clinical Social Worker

## 2020-03-04 NOTE — Telephone Encounter (Signed)
CSW received call form patient stating that he needs a new co pay card as his  now expired. CSW provided cards and left at front desk for patient to pick up. Patient grateful for the support. Lasandra Beech, LCSW, CCSW-MCS [phone]

## 2020-04-07 ENCOUNTER — Telehealth: Payer: Self-pay

## 2020-04-07 NOTE — Telephone Encounter (Signed)
Called and spoke w/pt's wife stated that we needed new insurance but the wife stated that she will not have active plan until November possibly. I instructed the pt to make their sample last for 3 weeks instead of 2 and that we could help sample them out some when the time comes. They voiced understanding

## 2020-04-07 NOTE — Telephone Encounter (Signed)
Called and lmomed the pt's wife stating that we need insurance as the insurance for rx on file stated that the pt was inactive and we need to complete a pa for repatha

## 2020-04-07 NOTE — Telephone Encounter (Signed)
Patient's wife returning call. 

## 2020-06-21 ENCOUNTER — Other Ambulatory Visit: Payer: Self-pay | Admitting: Internal Medicine

## 2020-07-02 MED ORDER — PRALUENT 150 MG/ML ~~LOC~~ SOAJ: 1.0000 "pen " | SUBCUTANEOUS | 11 refills | Status: DC

## 2020-07-02 NOTE — Telephone Encounter (Signed)
praluent approved through 07/01/2021. Copay card faxed to pharmacy. Patient made aware

## 2020-07-02 NOTE — Addendum Note (Signed)
Addended by: Malena Peer D on: 07/02/2020 09:59 AM   Modules accepted: Orders

## 2020-09-06 ENCOUNTER — Telehealth (HOSPITAL_COMMUNITY): Payer: Self-pay | Admitting: *Deleted

## 2020-09-06 NOTE — Telephone Encounter (Signed)
Received fax from [facility], pt needs clearance to hold Eliquis for a colonoscopy with Dr Marca Ancona  Per Dr Gala Romney: "ok to hold Eliquis for 48 hours prior"  Note faxed back to them at [phone]

## 2020-09-30 ENCOUNTER — Other Ambulatory Visit (HOSPITAL_COMMUNITY): Payer: Self-pay | Admitting: *Deleted

## 2020-09-30 DIAGNOSIS — I5022 Chronic systolic (congestive) heart failure: Secondary | ICD-10-CM

## 2020-10-25 ENCOUNTER — Encounter (HOSPITAL_COMMUNITY): Payer: Self-pay | Admitting: Internal Medicine

## 2020-10-25 ENCOUNTER — Ambulatory Visit (HOSPITAL_COMMUNITY)
Admission: RE | Admit: 2020-10-25 | Discharge: 2020-10-25 | Disposition: A | Discharge status: Home / Self Care | Payer: 59 | Source: Ambulatory Visit | Attending: Internal Medicine | Admitting: Internal Medicine

## 2020-10-25 ENCOUNTER — Other Ambulatory Visit: Payer: Self-pay

## 2020-10-25 ENCOUNTER — Ambulatory Visit (HOSPITAL_BASED_OUTPATIENT_CLINIC_OR_DEPARTMENT_OTHER)
Admission: RE | Admit: 2020-10-25 | Discharge: 2020-10-25 | Disposition: A | Discharge status: Home / Self Care | Payer: 59 | Source: Ambulatory Visit | Attending: Internal Medicine | Admitting: Internal Medicine

## 2020-10-25 VITALS — BP 110/78 | HR 88 | Wt 212.8 lb

## 2020-10-25 DIAGNOSIS — Z7901 Long term (current) use of anticoagulants: Secondary | ICD-10-CM | POA: Insufficient documentation

## 2020-10-25 DIAGNOSIS — F1010 Alcohol abuse, uncomplicated: Secondary | ICD-10-CM | POA: Insufficient documentation

## 2020-10-25 DIAGNOSIS — I639 Cerebral infarction, unspecified: Secondary | ICD-10-CM | POA: Diagnosis not present

## 2020-10-25 DIAGNOSIS — I5022 Chronic systolic (congestive) heart failure: Secondary | ICD-10-CM

## 2020-10-25 DIAGNOSIS — Z79899 Other long term (current) drug therapy: Secondary | ICD-10-CM | POA: Insufficient documentation

## 2020-10-25 DIAGNOSIS — I2510 Atherosclerotic heart disease of native coronary artery without angina pectoris: Secondary | ICD-10-CM

## 2020-10-25 DIAGNOSIS — Z8673 Personal history of transient ischemic attack (TIA), and cerebral infarction without residual deficits: Secondary | ICD-10-CM | POA: Diagnosis not present

## 2020-10-25 DIAGNOSIS — I110 Hypertensive heart disease with heart failure: Secondary | ICD-10-CM | POA: Diagnosis not present

## 2020-10-25 DIAGNOSIS — Z87891 Personal history of nicotine dependence: Secondary | ICD-10-CM | POA: Diagnosis not present

## 2020-10-25 DIAGNOSIS — I255 Ischemic cardiomyopathy: Secondary | ICD-10-CM | POA: Insufficient documentation

## 2020-10-25 DIAGNOSIS — F1410 Cocaine abuse, uncomplicated: Secondary | ICD-10-CM | POA: Insufficient documentation

## 2020-10-25 DIAGNOSIS — E669 Obesity, unspecified: Secondary | ICD-10-CM | POA: Insufficient documentation

## 2020-10-25 DIAGNOSIS — E785 Hyperlipidemia, unspecified: Secondary | ICD-10-CM | POA: Diagnosis not present

## 2020-10-25 HISTORY — DX: Heart failure, unspecified: I50.9

## 2020-10-25 LAB — BASIC METABOLIC PANEL
Anion gap: 8 (ref 5–15)
BUN: 11 mg/dL (ref 6–20)
CO2: 23 mmol/L (ref 22–32)
Calcium: 9.6 mg/dL (ref 8.9–10.3)
Chloride: 110 mmol/L (ref 98–111)
Creatinine, Ser: 0.81 mg/dL (ref 0.61–1.24)
GFR, Estimated: >60 mL/min (ref >60)
Glucose, Bld: 101 mg/dL — ABNORMAL HIGH (ref 70–99)
Potassium: 4.1 mmol/L (ref 3.5–5.1)
Sodium: 141 mmol/L (ref 135–145)

## 2020-10-25 LAB — CBC
HCT: 44 % (ref 39.0–52.0)
Hemoglobin: 14.4 g/dL (ref 13.0–17.0)
MCH: 30.3 pg (ref 26.0–34.0)
MCHC: 32.7 g/dL (ref 30.0–36.0)
MCV: 92.4 fL (ref 80.0–100.0)
Platelets: 332 10*3/uL (ref 150–400)
RBC: 4.76 MIL/uL (ref 4.22–5.81)
RDW: 12.5 % (ref 11.5–15.5)
WBC: 9.4 10*3/uL (ref 4.0–10.5)
nRBC: 0 % (ref 0.0–0.2)

## 2020-10-25 LAB — ECHOCARDIOGRAM COMPLETE
Area-P 1/2: 4.36 cm2
Calc EF: 43.3 %
S' Lateral: 4.1 cm
Single Plane A2C EF: 40.9 %
Single Plane A4C EF: 46.6 %

## 2020-10-25 LAB — BRAIN NATRIURETIC PEPTIDE: B Natriuretic Peptide: 18.3 pg/mL (ref 0.0–100.0)

## 2020-10-25 MED ORDER — DAPAGLIFLOZIN PROPANEDIOL 10 MG PO TABS: 10.0000 mg | ORAL_TABLET | Freq: Every day | ORAL | 3 refills | Status: DC

## 2020-10-25 MED ORDER — ASPIRIN EC 81 MG PO TBEC: 81.0000 mg | DELAYED_RELEASE_TABLET | Freq: Every day | ORAL | 3 refills | Status: AC

## 2020-10-25 MED ORDER — CARVEDILOL 12.5 MG PO TABS: 12.5000 mg | ORAL_TABLET | Freq: Two times a day (BID) | ORAL | 3 refills | Status: DC

## 2020-10-25 MED ORDER — SPIRONOLACTONE 25 MG PO TABS: ORAL_TABLET | ORAL | 3 refills | Status: DC

## 2020-10-25 MED ORDER — ENTRESTO 97-103 MG PO TABS: 1.0000 | ORAL_TABLET | Freq: Two times a day (BID) | ORAL | 3 refills | Status: DC

## 2020-10-25 NOTE — Addendum Note (Signed)
Encounter addended by: Noralee Space, RN on: 10/25/2020 4:34 PM  Actions taken: Order list changed

## 2020-10-25 NOTE — Patient Instructions (Addendum)
Stop Eliquis  Start Aspirin 81 mg Daily  Start Farxiga 10 mg Daily  Your provider has prescribed Marcelline Deist for you. Please be aware the most common side effect of this medication is urinary tract infections and yeast infections. Please practice good hygiene and keep this area clean and dry to help prevent this. If you do begin to have symptoms of these infections, such as difficulty urinating or painful urination,  please let us know.  Labs done today, your results will be available in MyChart, we will contact you for abnormal readings.  Please call our office in August to schedule your follow up appointment  If you have any questions or concerns before your next appointment please send [place] a message through [place] or call our office at [phone].    TO LEAVE A MESSAGE FOR THE NURSE SELECT OPTION 2, PLEASE LEAVE A MESSAGE INCLUDING: . YOUR NAME . DATE OF BIRTH . CALL BACK NUMBER . REASON FOR CALL**this is important as we prioritize the call backs  YOU WILL RECEIVE A CALL BACK THE SAME DAY AS LONG AS YOU CALL BEFORE 4:00 PM  At the [facility], you and your health needs are our priority. As part of our continuing mission to provide you with exceptional heart care, we have created designated Provider Care Teams. These Care Teams include your primary Cardiologist (physician) and Advanced Practice Providers (APPs- Physician Assistants and Nurse Practitioners) who all work together to provide you with the care you need, when you need it.   You may see any of the following providers on your designated Care Team at your next follow up: Marland Kitchen Dr Arvilla Meres . Dr Marca Ancona . Dr Thornell Mule . Tonye Becket, NP . Robbie Lis, PA . Shanda Bumps [place],NP . Karle Plumber, PharmD   Please be sure to bring in all your medications bottles to every appointment.

## 2020-10-25 NOTE — Progress Notes (Signed)
Advanced [facility] Failure Clinic Note   Primary Care: Dr. Susa Simmonds Primary Cardiologist: Dr. Gala Romney  HPI:  Angel Davies is a 50 y.o. male with PMH of HTN, ADHD, Obesity, anxiety, substance abuse, and depression.   Pt presented 07/02/2017 with CVA. UDS + for cocaine. Found to have new systolic CHF by echo.   Echo 07/02/17 with LVEF 20-25%, grade 2 DD, Trivial MR, Trivial TR -> HF team consulted.  With new CHF, pt underwent R/LHC as below that showed severe 3vD with no options for CABG/CAD. HF meds titrated as tolerated.  Admitted to CIR from 12/7 - 07/27/17 with marked improvement. Med titration somewhat limited due to soft pressures.   He presents today for HF follow up. Had COVID-19 over the summer and recovered.  Here for f/u. Doing very well. Going to gym 3x/week and doing 1 hour of cardio at a time on Nu-step.Marland Kitchen No CP or SOB. Main limitation is left-sided weakness. Volume status ok. Can walk a long way on flat surface but gets SOB walking up hill.   Echo today 10/25/20 EF 45-50% RV ok Personally reviewed   Echo 12/17/17 EF 30-35% No apical thrombus.  Echo 08/02/18 EF 35-40%.  Review of systems complete and found to be negative unless listed in HPI.   cMRI on 07/06/17 with 1) Severe LVE with EF 18% septal and apical akinesis diffuse hypokinesis 2) Apical thrombus measuring 11 mm x 8 mm 3) Full thickness scar involving the mid and distal septum and apex Subendocardial scar involving the basal inferior lateral wall  R/LHC 07/05/17  Mid RCA to Dist RCA lesion is 100% stenosed.  Dist Cx lesion is 100% stenosed.  Mid LAD lesion is 100% stenosed.  Prox Cx to Dist Cx lesion is 30% stenosed.  Findings: Ao = 104/68 (84) LV = 110/9 RA = 1 RV = 19/3 PA = 19/9 (11) PCW = 5 Fick cardiac output/index = 4.3/2.1 PVR = 1.5 WU SVR = 2122 Ao sat = 96% PA sat = 64%, 66%  Assessment: 1. Severe CAD with left-dominant system and occluded mLAD 2. Ischemic CM with EF 25% by echo 3. Well  compensated filling pressures with moderately reduced cardiac output  Past Medical History:  Diagnosis Date  . ADHD (attention deficit hyperactivity disorder)   . Anxiety   . CHF (congestive heart failure) (HCC)   . CVA (cerebral vascular accident) (HCC) 07/02/2017   "left sided weakness" (07/02/2017)  . Depression   . History of kidney stones    Current Outpatient Medications  Medication Sig Dispense Refill  . Alirocumab (PRALUENT) 150 MG/ML SOAJ Inject 1 pen into the skin every 14 (fourteen) days. 2 mL 11  . amphetamine-dextroamphetamine (ADDERALL) 20 MG tablet Take 20 mg by mouth 3 (three) times daily.    Marland Kitchen apixaban (ELIQUIS) 5 MG TABS tablet Take 1 tablet (5 mg total) by mouth 2 (two) times daily. 180 tablet 3  . atorvastatin (LIPITOR) 80 MG tablet take 1 tablet by mouth daily at 6 pm 90 tablet 3  . carvedilol (COREG) 12.5 MG tablet Take 1 tablet (12.5 mg total) by mouth 2 (two) times daily with a meal. 60 tablet 11  . clonazePAM (KLONOPIN) 1 MG tablet Take 1 mg by mouth as needed.    Marland Kitchen ENTRESTO 97-103 MG TAKE ONE TABLET BY MOUTH TWICE DAILY  180 tablet 3  . fenofibrate 160 MG tablet TAKE ONE TABLET BY MOUTH ONE TIME DAILY  90 tablet 0  . folic acid (FOLVITE) 1 MG  tablet Take 1 tablet (1 mg total) by mouth daily. 30 tablet 0  . gabapentin (NEURONTIN) 300 MG capsule Take 1 capsule (300 mg total) by mouth 3 (three) times daily as needed. 30 capsule 3  . Multiple Vitamin (MULTIVITAMIN WITH MINERALS) TABS tablet Take 1 tablet by mouth daily. 100 tablet 0  . spironolactone (ALDACTONE) 25 MG tablet TAKE HALF TABLET BY MOUTH AT BEDTIME  45 tablet 3  . thiamine 100 MG tablet Take 1 tablet (100 mg total) by mouth daily. 30 tablet 0  . traZODone (DESYREL) 50 MG tablet Take 100-150 mg by mouth at bedtime as needed for sleep.     No current facility-administered medications for this encounter.   No Known Allergies  Social History   Socioeconomic History  . Marital status: Married     Spouse name: Not on file  . Number of children: Not on file  . Years of education: Not on file  . Highest education level: Not on file  Occupational History  . Not on file  Tobacco Use  . Smoking status: Former Smoker    Packs/day: 0.00    Years: 0.00    Pack years: 0.00    Types: Cigarettes, E-cigarettes  . Smokeless tobacco: Former Clinical biochemist  . Vaping Use: Former  . Substances: Nicotine  Substance and Sexual Activity  . Alcohol use: Yes    Alcohol/week: 1.0 standard drink    Types: 1 Glasses of wine per week    Comment: not weekly, social   . Drug use: Yes    Frequency: 7.0 times per week    Types: Marijuana    Comment: 07/02/2017 "smokes marijuana a couple days/week" , "08/20/17 vapes marijuana juice"  . Sexual activity: Yes  Other Topics Concern  . Not on file  Social History Narrative   Lives at home with wife and his father lives with them   Right handed   Social Determinants of Health   Financial Resource Strain: Not on file  Food Insecurity: Not on file  Transportation Needs: Not on file  Physical Activity: Not on file  Stress: Not on file  Social Connections: Not on file  Intimate Partner Violence: Not on file   Family History  Problem Relation Age of Onset  . Stroke Mother   . Colon cancer Mother   . Prostate cancer Father   . Seizures Neg Hx    Vitals:   10/25/20 1443  BP: 110/78  Pulse: 88  SpO2: 97%  Weight: 96.5 kg (212 lb 12.8 oz)   Wt Readings from Last 3 Encounters:  10/25/20 96.5 kg (212 lb 12.8 oz)  11/06/19 89.9 kg (198 lb 3.2 oz)  07/15/19 87.5 kg (193 lb)   PHYSICAL EXAM: General:  Well appearing. No resp difficulty HEENT: normal Neck: supple. no JVD. Carotids 2+ bilat; no bruits. No lymphadenopathy or thryomegaly appreciated. Cor: PMI nondisplaced. Regular rate & rhythm. No rubs, gallops or murmurs. Lungs: clear Abdomen: soft, nontender, nondistended. No hepatosplenomegaly. No bruits or masses. Good bowel  sounds. Extremities: no cyanosis, clubbing, rash, edema Neuro: alert & orientedx3, cranial nerves grossly intact. moves all 4 extremities w/o difficulty. Affect pleasant   ASSESSMENT & PLAN:  1. Chronic systolic CHF, ICM - Echo 07/02/17 with LVEF 20-25%, grade 2 DD, Trivial MR, Trivial TR - cMRI 12/18 EF 18% - EF 12/17/17 ~35% range. Echo 08/02/18: EF 35-40%. Now out of ICD range.  - Echo today 10/25/20 EF 45-50% RV ok Personally reviewed - Stable  NYHA II - Volume status looks good Does not use lasix.  - Continue Entresto 97/103 mg BID. BMET today.  - Continue carvedilol to 12.5 mg BID.  - Continue spiro 12.5 daily;  - Start Jardiance  2. CAD - LHC 07/05/17 with severe CAD with left-dominant system and occluded mLAD. - Mostly distal CAD. No good targets for CABG or PCI. Continue medical therapy. Based on scar pattern on MRI  - Continue statin/repatha. Will stop Eliquis. Add ASA - No s/s ischemia - Add Jardiance   3. CVA - MRI Head 07/02/17 R pontine infarct and sub acute R temporal occipital/PCA territory infarct, along with old left occipital lobe/PCA territory infarct. - LV thrombus has resolved. Can stop Eliquis  4. Cocaine abuse - Remains abstinent. No change  5. Alcohol abuse - Encouraged responsible moderation. No change.   6. HLD - Continue atorvastatin 80 mg daily and repatha.  - Getting Repatha. PCP following lipids - Goal LDL  < 70  Arvilla Meres, MD 10/25/20 3:43 PM

## 2020-10-25 NOTE — Progress Notes (Signed)
  Echocardiogram 2D Echocardiogram has been performed.  Angel Davies 10/25/2020, 2:30 PM

## 2020-10-25 NOTE — Addendum Note (Signed)
Encounter addended by: Noralee Space, RN on: 10/25/2020 4:14 PM  Actions taken: Visit diagnoses modified, Diagnosis association updated, Medication long-term status modified, Charge Capture section accepted, Order list changed, Clinical Note Signed

## 2020-11-11 ENCOUNTER — Other Ambulatory Visit (HOSPITAL_COMMUNITY): Payer: Self-pay | Admitting: *Deleted

## 2020-11-11 MED ORDER — ATORVASTATIN CALCIUM 80 MG PO TABS: ORAL_TABLET | ORAL | 3 refills | Status: AC

## 2021-05-03 ENCOUNTER — Encounter: Payer: Self-pay | Admitting: Physical Therapy

## 2021-05-03 ENCOUNTER — Ambulatory Visit: Payer: 59 | Attending: Family Medicine | Admitting: Physical Therapy

## 2021-05-03 DIAGNOSIS — M6289 Other specified disorders of muscle: Secondary | ICD-10-CM | POA: Insufficient documentation

## 2021-05-03 DIAGNOSIS — R296 Repeated falls: Secondary | ICD-10-CM | POA: Insufficient documentation

## 2021-05-03 DIAGNOSIS — M6281 Muscle weakness (generalized): Secondary | ICD-10-CM | POA: Diagnosis present

## 2021-05-03 DIAGNOSIS — R262 Difficulty in walking, not elsewhere classified: Secondary | ICD-10-CM | POA: Diagnosis present

## 2021-05-03 DIAGNOSIS — R41841 Cognitive communication deficit: Secondary | ICD-10-CM | POA: Diagnosis present

## 2021-05-03 NOTE — Therapy (Signed)
[facility] [address] [place], [place], [address] Phone: [phone]   Fax:  [phone]  Physical Therapy Evaluation  Patient Details  Name: Angel Davies MRN: [id number] Date of Birth: January 25, 1971 Referring Provider (PT): Corliss Blacker   Encounter Date: 05/03/2021   PT End of Session - 05/03/21 1152     Visit Number 1    Number of Visits 30    Date for PT Re-Evaluation 07/30/21    Authorization Type [employer]    PT Start Time 1015    PT Stop Time 1058    PT Time Calculation (min) 43 min    Activity Tolerance Patient tolerated treatment well    Behavior During Therapy [facility] for tasks assessed/performed             Past Medical History:  Diagnosis Date   ADHD (attention deficit hyperactivity disorder)    Anxiety    CHF (congestive heart failure) (HCC)    CVA (cerebral vascular accident) (HCC) 07/02/2017   "left sided weakness" (07/02/2017)   Depression    History of kidney stones     Past Surgical History:  Procedure Laterality Date   CYSTOSCOPY WITH URETEROSCOPY, STONE BASKETRY AND STENT PLACEMENT     RIGHT/LEFT HEART CATH AND CORONARY ANGIOGRAPHY N/A 07/05/2017   Procedure: RIGHT/LEFT HEART CATH AND CORONARY ANGIOGRAPHY;  Surgeon: Dolores Patty, MD;  [facility];  Service: Cardiovascular;  Laterality: N/A;    There were no vitals filed for this visit.    Subjective Assessment - 05/03/21 1016     Subjective Patient reports that he has had 3 strokes, reports that he has left side issues with weakness and tightness.  He reports that he has memory issues.  Reports that he has repeated falls, He reports 1-2 falls a week.  He reports that he is unsure of how or why he falls.  His most recent stroke with December 2019    Limitations Walking    Patient Stated Goals no falls, walk better, less tightness    Currently in Pain? No/denies    Pain Score 0-No pain    Pain Location Leg   reports "nerve pain"  left leg   Pain Orientation Left    Pain Descriptors / Indicators Burning    Pain Type Neuropathic pain    Pain Onset More than a month ago    Aggravating Factors  pain meds in the past, unsure why, feels like I an in a fire    Pain Relieving Factors reports that nothing really helps    Effect of Pain on Daily Activities reports that he just hurts and sometimes due to pain he cannot do much                [facility] PT Assessment - 05/03/21 0001       Assessment   Medical Diagnosis left LE pain, frequent falls, mm tightness    Referring Provider (PT) Corliss Blacker    Onset Date/Surgical Date 04/03/21    Hand Dominance Right    Prior Therapy afte the CVA      Precautions   Precautions Fall      Balance Screen   Has the patient fallen in the past 6 months Yes    How many times? 16    Has the patient had a decrease in activity level because of a fear of falling?  No    Is the patient reluctant to leave their home because of  a fear of falling?  No      Home Environment   Additional Comments has stairs, reports that he crawls up and scoot down, does some housework,      Prior Function   Level of Independence Independent    Vocation Unemployed    Vocation Requirements has applied for disability    Leisure goes to the gym, 2-3x/week      Cognition   Overall Cognitive Status --   reports issues iwth memory   Memory Impaired      Posture/Postural Control   Posture Comments fwd head, rounded shoulders      ROM / Strength   AROM / PROM / Strength AROM;Strength      AROM   Overall AROM Comments left shoulder appears to have ROM WFL's just "feels tight"    AROM Assessment Site Knee;Ankle    Right/Left Knee Left    Left Knee Extension 8    Left Knee Flexion 90    Right/Left Ankle Left    Left Ankle Dorsiflexion 4      Strength   Overall Strength Comments left hip, knee and ankle strength 4-/5 with ataxic type control in a ratcheting type pattern      Flexibility   Soft  Tissue Assessment /Muscle Length yes    Hamstrings very tight 45 degree SLR with pain    Quadriceps tight    ITB very tight    Piriformis very tight      Palpation   Palpation comment tender in the left calf "Nerve pain"      Standardized Balance Assessment   Standardized Balance Assessment Berg Balance Test      Berg Balance Test   Sit to Stand Able to stand without using hands and stabilize independently    Standing Unsupported Able to stand safely 2 minutes    Sitting with Back Unsupported but Feet Supported on Floor or Stool Able to sit safely and securely 2 minutes    Stand to Sit Sits safely with minimal use of hands    Transfers Able to transfer safely, definite need of hands    Standing Unsupported with Eyes Closed Able to stand 3 seconds    Standing Unsupported with Feet Together Able to place feet together independently but unable to hold for 30 seconds    From Standing, Reach Forward with Outstretched Arm Can reach forward >12 cm safely (5")    From Standing Position, Pick up Object from Floor Able to pick up shoe, needs supervision    From Standing Position, Turn to Look Behind Over each Shoulder Looks behind one side only/other side shows less weight shift    Turn 360 Degrees Able to turn 360 degrees safely but slowly    Standing Unsupported, Alternately Place Feet on Step/Stool Able to stand independently and safely and complete 8 steps in 20 seconds    Standing Unsupported, One Foot in Front Able to take small step independently and hold 30 seconds    Standing on One Leg Able to lift leg independently and hold 5-10 seconds    Total Score 43                        Objective measurements completed on examination: See above findings.                  PT Short Term Goals - 05/03/21 1158       PT SHORT TERM GOAL #1  Title independent with initial HEP (texted to his phone)    Time 2    Period Weeks    Status New               PT  Long Term Goals - 05/03/21 1159       PT LONG TERM GOAL #1   Title independent with advanced gym and flexibility program    Time 12    Period Weeks    Status New      PT LONG TERM GOAL #2   Title increase Berg balance test score to 47/56    Time 12    Period Weeks    Status New      PT LONG TERM GOAL #3   Title increase left LE SLR to 65 degrees    Time 12    Period Weeks    Status New      PT LONG TERM GOAL #4   Title increase left LE MMT to 4+/5    Time 12    Period Weeks    Status New                    Plan - 05/03/21 1153     Clinical Impression Statement Mr Hodes reports that he has had 3 past CVA's, he reports that the biggest issues and repeated falls and stiffness and tightness of the left LE.  Reviewwing the MD notes he does go to the gym and does the NuStep, he does not do much else and does not do any flexibility, she wanted PT involved due to him having memory issues and needing a written plan.  He is very tight in the left HS, piriformis and quad and calves.  He reports about a fall a week, he is unsure of why he falls.  His Berg balance test was 42/56.  He Has limitation in left knee flexion, some weakness with a ratcheting pattern with MMT.  He does c/o left LE pain reports burning, has been given different medications for this in the past but reports nothing has helped    Stability/Clinical Decision Making Stable/Uncomplicated    Clinical Decision Making Low    Rehab Potential Fair    PT Frequency 1x / week    PT Duration 12 weeks    PT Treatment/Interventions ADLs/Self Care Home Management;Electrical Stimulation;Moist Heat;Gait training;Stair training;Functional mobility training;Therapeutic activities;Therapeutic exercise;Balance training;Neuromuscular re-education;Patient/family education;Manual techniques    PT Next Visit Plan start gym activities, flexibility and balance    Consulted and Agree with Plan of Care Patient              Patient will benefit from skilled therapeutic intervention in order to improve the following deficits and impairments:  Abnormal gait, Decreased range of motion, Increased muscle spasms, Pain, Impaired flexibility, Decreased balance, Decreased strength, Decreased mobility  Visit Diagnosis: Muscle weakness (generalized) - Plan: PT plan of care cert/re-cert  Hamstring tightness - Plan: PT plan of care cert/re-cert  Difficulty in walking, not elsewhere classified - Plan: PT plan of care cert/re-cert  Repeated falls - Plan: PT plan of care cert/re-cert     Problem List Patient Active Problem List   Diagnosis Date Noted   Tibial neuropathy, 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   Chronic systolic heart failure (HCC) 08/07/2017   Encounter for therapeutic drug monitoring 07/27/2017   Muscle pain    Supratherapeutic INR    Leukocytosis    Coronary artery disease involving native coronary artery of native heart without  angina pectoris    Prediabetes    Hemorrhoids    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   Left hemiparesis (HCC)    Cardiomyopathy, ischemic    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 [place], [place], [address] Phone: [phone]   Fax:  [phone]  Name: Angel Davies MRN: [id number] Date of Birth: June 21, 1971

## 2021-05-03 NOTE — Patient Instructions (Signed)
Access Code: [email or web address] URL: [email or web address] Date: 05/03/2021 Prepared by: Stacie Glaze  Exercises Seated Hamstring Stretch with Chair - 2 x daily - 7 x weekly - 1 sets - 10 reps - 30 hold Supine Piriformis Stretch Pulling Heel to Hip - 2 x daily - 7 x weekly - 1 sets - 10 reps - 30 hold Slant Board Gastrocnemius Stretch - 2 x daily - 7 x weekly - 1 sets - 10 reps - 30 hold

## 2021-05-10 ENCOUNTER — Ambulatory Visit: Payer: 59 | Admitting: Speech Pathology

## 2021-05-10 ENCOUNTER — Other Ambulatory Visit: Payer: Self-pay

## 2021-05-10 ENCOUNTER — Encounter: Payer: Self-pay | Admitting: Speech Pathology

## 2021-05-10 ENCOUNTER — Ambulatory Visit: Payer: 59 | Admitting: Physical Therapy

## 2021-05-10 ENCOUNTER — Encounter: Payer: Self-pay | Admitting: Physical Therapy

## 2021-05-10 DIAGNOSIS — R41841 Cognitive communication deficit: Secondary | ICD-10-CM

## 2021-05-10 DIAGNOSIS — M6281 Muscle weakness (generalized): Secondary | ICD-10-CM | POA: Diagnosis not present

## 2021-05-10 DIAGNOSIS — M6289 Other specified disorders of muscle: Secondary | ICD-10-CM

## 2021-05-10 DIAGNOSIS — R296 Repeated falls: Secondary | ICD-10-CM

## 2021-05-10 DIAGNOSIS — R262 Difficulty in walking, not elsewhere classified: Secondary | ICD-10-CM

## 2021-05-10 NOTE — Therapy (Signed)
[facility] [place] [facility] [address] [place], [place], [address] Phone: [phone]   Fax:  [phone]  Physical Therapy Treatment  Patient Details  Name: Angel Davies MRN: [id number] Date of Birth: August 23, 1970 Referring Provider (PT): Corliss Blacker   Encounter Date: 05/10/2021   PT End of Session - 05/10/21 1640     Visit Number 2    Number of Visits 30    PT Start Time 1559    PT Stop Time 1640    PT Time Calculation (min) 41 min    Activity Tolerance Patient tolerated treatment well    Behavior During Therapy [facility] for tasks assessed/performed             Past Medical History:  Diagnosis Date   ADHD (attention deficit hyperactivity disorder)    Anxiety    CHF (congestive heart failure) (HCC)    CVA (cerebral vascular accident) (HCC) 07/02/2017   "left sided weakness" (07/02/2017)   Depression    History of kidney stones     Past Surgical History:  Procedure Laterality Date   CYSTOSCOPY WITH URETEROSCOPY, STONE BASKETRY AND STENT PLACEMENT     RIGHT/LEFT HEART CATH AND CORONARY ANGIOGRAPHY N/A 07/05/2017   Procedure: RIGHT/LEFT HEART CATH AND CORONARY ANGIOGRAPHY;  Surgeon: Dolores Patty, MD;  [facility];  Service: Cardiovascular;  Laterality: N/A;    There were no vitals filed for this visit.   Subjective Assessment - 05/10/21 1601     Subjective "I been ok" Fell down yesterday or the day before, no injuries    Currently in Pain? No/denies                               [facility] Adult PT Treatment/Exercise - 05/10/21 0001       Exercises   Exercises Lumbar;Knee/Hip      Lumbar Exercises: Aerobic   Nustep L5 x6 min      Lumbar Exercises: Machines for Strengthening   Cybex Knee Extension 15lb x10, LLE 2x10    Cybex Knee Flexion 35lb x10, LLE 20lb 2x10      Lumbar Exercises: Standing   Row Strengthening;Both;20 reps    Row Limitations 15    Shoulder Extension  Strengthening;Both;20 reps    Shoulder Extension Limitations 10      Lumbar Exercises: Seated   Sit to Stand 10 reps   x2, OHP yellow ball     Knee/Hip Exercises: Standing   Heel Raises Both;2 sets;15 reps;2 seconds    Lateral Step Up Both;1 set;10 reps;Hand Hold: 0;Step Height: 6"    Forward Step Up Both;1 set;10 reps;Hand Hold: 0;Step Height: 6"                       PT Short Term Goals - 05/03/21 1158       PT SHORT TERM GOAL #1   Title independent with initial HEP (texted to his phone)    Time 2    Period Weeks    Status New               PT Long Term Goals - 05/03/21 1159       PT LONG TERM GOAL #1   Title independent with advanced gym and flexibility program    Time 12    Period Weeks    Status New      PT LONG TERM GOAL #2  Title increase Berg balance test score to 47/56    Time 12    Period Weeks    Status New      PT LONG TERM GOAL #3   Title increase left LE SLR to 65 degrees    Time 12    Period Weeks    Status New      PT LONG TERM GOAL #4   Title increase left LE MMT to 4+/5    Time 12    Period Weeks    Status New                   Plan - 05/10/21 1640     Clinical Impression Statement Pt tolerated an initial progression to TE well evident by no subjective reports of increase pain. All interventions completed well, cue required throughout for pacing. Pt tend to complete interventions too quickly. Postural weakness present with standing shoulder extensions and rows. Some instability present with step up interventions. LE burning with SL strengthening.    Stability/Clinical Decision Making Stable/Uncomplicated    Rehab Potential Fair    PT Frequency 1x / week    PT Duration 12 weeks    PT Treatment/Interventions ADLs/Self Care Home Management;Electrical Stimulation;Moist Heat;Gait training;Stair training;Functional mobility training;Therapeutic activities;Therapeutic exercise;Balance training;Neuromuscular  re-education;Patient/family education;Manual techniques    PT Next Visit Plan gym activities, flexibility and balance             Patient will benefit from skilled therapeutic intervention in order to improve the following deficits and impairments:  Abnormal gait, Decreased range of motion, Increased muscle spasms, Pain, Impaired flexibility, Decreased balance, Decreased strength, Decreased mobility  Visit Diagnosis: Difficulty in walking, not elsewhere classified  Hamstring tightness  Muscle weakness (generalized)  Repeated falls     Problem List Patient Active Problem List   Diagnosis Date Noted   Tibial neuropathy, 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   Chronic systolic heart failure (HCC) 08/07/2017   Encounter for therapeutic drug monitoring 07/27/2017   Muscle pain    Supratherapeutic INR    Leukocytosis    Coronary artery disease involving native coronary artery of native heart without angina pectoris    Prediabetes    Hemorrhoids    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   Left hemiparesis (HCC)    Cardiomyopathy, ischemic    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 [place], [place], [address] Phone: [phone]   Fax:  [phone]  Name: Angel Davies MRN: [id number] Date of Birth: [birth year]

## 2021-05-11 NOTE — Therapy (Signed)
[facility] [facility] [address] [place], [place], [address] Phone: [phone]   Fax:  [phone]  Speech Language Pathology Evaluation  Patient Details  Name: Angel Davies MRN: [id number] Date of Birth: [birth year] Referring Provider (SLP): Gweneth Dimitri MD   Encounter Date: 05/10/2021   End of Session - 05/10/21 1520     Visit Number 1    Number of Visits 4    Date for SLP Re-Evaluation 07/10/21    SLP Start Time 1445    SLP Stop Time  1525    SLP Time Calculation (min) 40 min    Activity Tolerance Patient tolerated treatment well             Past Medical History:  Diagnosis Date   ADHD (attention deficit hyperactivity disorder)    Anxiety    CHF (congestive heart failure) (HCC)    CVA (cerebral vascular accident) (HCC) 07/02/2017   "left sided weakness" (07/02/2017)   Depression    History of kidney stones     Past Surgical History:  Procedure Laterality Date   CYSTOSCOPY WITH URETEROSCOPY, STONE BASKETRY AND STENT PLACEMENT     RIGHT/LEFT HEART CATH AND CORONARY ANGIOGRAPHY N/A 07/05/2017   Procedure: RIGHT/LEFT HEART CATH AND CORONARY ANGIOGRAPHY;  Surgeon: Dolores Patty, MD;  [facility];  Service: Cardiovascular;  Laterality: N/A;    There were no vitals filed for this visit.   Subjective Assessment - 05/10/21 1451     Subjective "I need to shut the door because I cant focus."    Currently in Pain? No/denies                SLP Evaluation [facility] - 05/10/21 1451       SLP Visit Information   SLP Received On 05/10/21    Referring Provider (SLP) Gweneth Dimitri MD    Onset Date 2019    Medical Diagnosis CVA      Subjective   Patient/Family Stated Goal To get back to work      General Information   HPI Reports 3 strokes (2018 x1, 2019 x2). Pt reports he falls x1-2 /week. Pt endorses memory concerns. PMH of HTN, ADHD, anxiety, substance abuse, and depression.      Balance  Screen   Has the patient fallen in the past 6 months Yes    How many times? Several; "once a week"    Has the patient had a decrease in activity level because of a fear of falling?  No    Is the patient reluctant to leave their home because of a fear of falling?  No      Prior Functional Status   Type of Home House     Lives With Family;Spouse    Available Support Family    Education [employer]    Vocation Unemployed      Cognition   Overall Cognitive Status Impaired/Different from baseline    Area of Impairment Attention;Memory;Safety/judgement    Education officer, environmental Comprehension   Overall Auditory Comprehension Appears within functional limits for tasks assessed      Verbal Expression   Overall Verbal Expression Appears within functional limits for tasks assessed      Standardized Assessments   Standardized Assessments  Other Assessment    Other Assessment SLUMS, CLQT, MMQ  SLP Education - 05/10/21 1520     Education Details cognitive communication impairment    Person(s) Educated Patient    Methods Demonstration;Handout    Comprehension Verbalized understanding              SLP Short Term Goals - 05/11/21 0906       SLP SHORT TERM GOAL #1   Title Pt will recall 3 attention strategies to assist with increasing concentration across 2 sessions.    Time 2    Period Weeks    Status New    Target Date 05/25/21      SLP SHORT TERM GOAL #2   Title Pt will recall 3 memory strategies to assist with recall of important information across 2 sessions.    Time 2    Period Weeks    Status New    Target Date 05/25/21              SLP Long Term Goals - 05/11/21 0903       SLP LONG TERM GOAL #1   Title Pt will report use of 2+ attention strategies to assist with increase concentration and focus.    Time 4    Period Weeks    Status New    Target Date 06/08/21      SLP LONG TERM GOAL #2    Title Pt will increase score on MMQ to greater than 17 on "How I feel about my Memory".    Time 4    Period Weeks    Status New    Target Date 06/08/21      SLP LONG TERM GOAL #3   Title Pt will increase score on MMQ to greater than 4 on "Memory Mistakes".    Time 4    Period Weeks    Status New    Target Date 06/08/21              Plan - 05/10/21 1832     Clinical Impression Statement Pt is a 50 yo male who presents to OP ST for evaluation post CVA in 2019 (x2) and 2018 (x1). Pt endorses premorbid attention deficits that have been exacerbated post CVA and memory impairment. Pt reports he is currently unemployed and has found it difficult to find a job due to physical limitations. He also reports he cannot return to his previous position because he can't "count items" (loses count). Pt was fidgeting during session (picking, rubbing hands on table), but was able to focus during assessment. SLP screened pt using the SLUMS - pt scored a 26/30 which placed pt in the "mild range". SLP assessed using subtests of CLQT. Pt scored a 13/13 on clock drawing, a 7/10 on story retell, and 7/9 on generative naming. Most difficulty was noted with more abstract items on generative naming task.Pt required repetition of questions throughout assessment and noted redirections to task. Pt demonstrated difficulty w/ topic maintenance which SLP suspects is related to attention. SLP provided pt with PROMS for memory. Pt completed the Multifactorial Memory Questionnaire [facility]) with the following scores: "How I Feel About My Memory" 17 "very low"; "Memory Mistakes 4 "very low"; and "Use of Memory Strategies" 62 "above average". SLP rec skilled ST services to address mild memory and attention impairment. SLP rec pt speak to doc about management of ADHD to assist with attention.    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   Chronic systolic heart failure (HCC) 08/07/2017   Encounter for therapeutic drug monitoring 07/27/2017   Muscle pain    Supratherapeutic INR    Leukocytosis    Coronary artery disease involving native coronary artery of native heart without angina pectoris    Prediabetes    Hemorrhoids    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   Left hemiparesis (HCC)    Cardiomyopathy, ischemic    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 [place], [place], [address] Phone: [phone]   Fax:  [phone]  Name: Angel Davies MRN: [id number] Date of Birth: 10-29-1970

## 2021-05-17 ENCOUNTER — Encounter: Payer: Self-pay | Admitting: Speech Pathology

## 2021-05-17 ENCOUNTER — Other Ambulatory Visit: Payer: Self-pay

## 2021-05-17 ENCOUNTER — Ambulatory Visit: Payer: 59 | Admitting: Speech Pathology

## 2021-05-17 DIAGNOSIS — R41841 Cognitive communication deficit: Secondary | ICD-10-CM

## 2021-05-17 DIAGNOSIS — M6281 Muscle weakness (generalized): Secondary | ICD-10-CM | POA: Diagnosis not present

## 2021-05-17 NOTE — Therapy (Signed)
[facility] [address] [place], [place], [address] Phone: [phone]   Fax:  [phone]  Speech Language Pathology Treatment  Patient Details  Name: Angel Davies MRN: [id number] Date of Birth: Apr 03, 1971 Referring Provider (SLP): Gweneth Dimitri MD   Encounter Date: 05/17/2021   End of Session - 05/17/21 0852     Visit Number 2    Number of Visits 4    Date for SLP Re-Evaluation 07/10/21    SLP Start Time 0846    SLP Stop Time  0928    SLP Time Calculation (min) 42 min    Activity Tolerance Patient tolerated treatment well             Past Medical History:  Diagnosis Date   ADHD (attention deficit hyperactivity disorder)    Anxiety    CHF (congestive heart failure) (HCC)    CVA (cerebral vascular accident) (HCC) 07/02/2017   "left sided weakness" (07/02/2017)   Depression    History of kidney stones     Past Surgical History:  Procedure Laterality Date   CYSTOSCOPY WITH URETEROSCOPY, STONE BASKETRY AND STENT PLACEMENT     RIGHT/LEFT HEART CATH AND CORONARY ANGIOGRAPHY N/A 07/05/2017   Procedure: RIGHT/LEFT HEART CATH AND CORONARY ANGIOGRAPHY;  Surgeon: Dolores Patty, MD;  [facility];  Service: Cardiovascular;  Laterality: N/A;    There were no vitals filed for this visit.   Subjective Assessment - 05/17/21 0850     Subjective "No one tells you anything in the first 10 days of the hospital."    Currently in Pain? No/denies                   ADULT SLP TREATMENT - 05/17/21 0855       General Information   Behavior/Cognition Alert;Cooperative;Distractible;Requires cueing      Treatment Provided   Treatment provided Cognitive-Linquistic      Cognitive-Linquistic Treatment   Treatment focused on Cognition    Skilled Treatment SLP trained pt in attention skills this session. Pt required supports for attention (cover sheet to reduce visual distraction, shorter/more  direct sentences). Pt was able to provide an example with minA and repetition of questions throughout tx session.      Assessment / Recommendations / Plan   Plan Continue with current plan of care      Progression Toward Goals   Progression toward goals Progressing toward goals                SLP Short Term Goals - 05/11/21 0906       SLP SHORT TERM GOAL #1   Title Pt will recall 3 attention strategies to assist with increasing concentration across 2 sessions.    Time 2    Period Weeks    Status New    Target Date 05/25/21      SLP SHORT TERM GOAL #2   Title Pt will recall 3 memory strategies to assist with recall of important information across 2 sessions.    Time 2    Period Weeks    Status New    Target Date 05/25/21              SLP Long Term Goals - 05/11/21 0903       SLP LONG TERM GOAL #1   Title Pt will report use of 2+ attention strategies to assist with increase concentration and focus.    Time 4    Period  Weeks    Status New    Target Date 06/08/21      SLP LONG TERM GOAL #2   Title Pt will increase score on MMQ to greater than 17 on "How I feel about my Memory".    Time 4    Period Weeks    Status New    Target Date 06/08/21      SLP LONG TERM GOAL #3   Title Pt will increase score on MMQ to greater than 4 on "Memory Mistakes".    Time 4    Period Weeks    Status New    Target Date 06/08/21              Plan - 05/17/21 0853     Clinical Impression Statement See tx note. Pt required several redirections to during training of attention skills. Pt reported he was late to his short term disability hearing because he forgot the time and then forgot how to be there. Pt also reports he has difficulty with task completion. Cont with current POC.    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   Chronic systolic heart failure (HCC) 08/07/2017   Encounter for therapeutic drug monitoring 07/27/2017   Muscle pain    Supratherapeutic INR    Leukocytosis    Coronary artery disease involving native coronary artery of native heart without angina pectoris    Prediabetes    Hemorrhoids    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   Left hemiparesis (HCC)    Cardiomyopathy, ischemic    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 [place], [place], [address] Phone: [phone]   Fax:  [phone]   Name: Angel Davies MRN: [id number] Date of Birth: Dec 15, 1970

## 2021-05-19 ENCOUNTER — Ambulatory Visit (HOSPITAL_COMMUNITY)
Admission: RE | Admit: 2021-05-19 | Discharge: 2021-05-19 | Disposition: A | Discharge status: Home / Self Care | Payer: 59 | Source: Ambulatory Visit | Attending: Internal Medicine | Admitting: Internal Medicine

## 2021-05-19 ENCOUNTER — Other Ambulatory Visit: Payer: Self-pay

## 2021-05-19 ENCOUNTER — Telehealth: Payer: Self-pay | Admitting: Neurology

## 2021-05-19 VITALS — BP 110/80 | HR 82 | Wt 203.4 lb

## 2021-05-19 DIAGNOSIS — Z7982 Long term (current) use of aspirin: Secondary | ICD-10-CM | POA: Diagnosis not present

## 2021-05-19 DIAGNOSIS — F1410 Cocaine abuse, uncomplicated: Secondary | ICD-10-CM | POA: Diagnosis not present

## 2021-05-19 DIAGNOSIS — I110 Hypertensive heart disease with heart failure: Secondary | ICD-10-CM | POA: Insufficient documentation

## 2021-05-19 DIAGNOSIS — Z8673 Personal history of transient ischemic attack (TIA), and cerebral infarction without residual deficits: Secondary | ICD-10-CM | POA: Diagnosis not present

## 2021-05-19 DIAGNOSIS — F1010 Alcohol abuse, uncomplicated: Secondary | ICD-10-CM | POA: Insufficient documentation

## 2021-05-19 DIAGNOSIS — E782 Mixed hyperlipidemia: Secondary | ICD-10-CM

## 2021-05-19 DIAGNOSIS — I5022 Chronic systolic (congestive) heart failure: Secondary | ICD-10-CM | POA: Insufficient documentation

## 2021-05-19 DIAGNOSIS — I2510 Atherosclerotic heart disease of native coronary artery without angina pectoris: Secondary | ICD-10-CM | POA: Diagnosis not present

## 2021-05-19 DIAGNOSIS — Z7984 Long term (current) use of oral hypoglycemic drugs: Secondary | ICD-10-CM | POA: Insufficient documentation

## 2021-05-19 DIAGNOSIS — E785 Hyperlipidemia, unspecified: Secondary | ICD-10-CM | POA: Diagnosis not present

## 2021-05-19 NOTE — Progress Notes (Signed)
Advanced Heart Failure Clinic Note   Primary Care: Dr. Susa Simmonds Primary Cardiologist: Dr. Gala Romney  HPI:  Angel Davies is a 50 y.o. male with PMH of HTN, ADHD, Obesity, anxiety, substance abuse, and depression.   Pt presented 07/02/2017 with CVA. UDS + for cocaine. Found to have new systolic CHF by echo.   Echo 07/02/17 with LVEF 20-25%, grade 2 DD, Trivial MR, Trivial TR -> HF team consulted.  With new CHF, pt underwent R/LHC as below that showed severe 3vD with no options for CABG/CAD. HF meds titrated as tolerated.  Admitted to CIR from 12/7 - 07/27/17 with marked improvement. Med titration somewhat limited due to soft pressures.   He presents today for HF follow up. Had COVID-19 over the summer and recovered.  Here for f/u. Struggling with memory loss and unable to get a job. Denies CP, SOB, orthopnea or PND. Continues to go to gym 3x/week and does 1hr on Nu-step.   Echo  10/25/20 EF 45-50% RV ok    Echo 12/17/17 EF 30-35% No apical thrombus.  Echo 08/02/18 EF 35-40%.  Review of systems complete and found to be negative unless listed in HPI.   cMRI on 07/06/17 with 1) Severe LVE with EF 18% septal and apical akinesis diffuse hypokinesis 2) Apical thrombus measuring 11 mm x 8 mm 3) Full thickness scar involving the mid and distal septum and apex Subendocardial scar involving the basal inferior lateral wall   R/LHC 07/05/17 Mid RCA to Dist RCA lesion is 100% stenosed. Dist Cx lesion is 100% stenosed. Mid LAD lesion is 100% stenosed. Prox Cx to Dist Cx lesion is 30% stenosed.   Findings: Ao = 104/68 (84) LV = 110/9 RA = 1 RV = 19/3 PA = 19/9 (11) PCW = 5 Fick cardiac output/index = 4.3/2.1 PVR = 1.5 WU SVR = 2122 Ao sat = 96% PA sat = 64%, 66%   Assessment: 1. Severe CAD with left-dominant system and occluded mLAD 2. Ischemic CM with EF 25% by echo 3. Well compensated filling pressures with moderately reduced cardiac output  Past Medical History:  Diagnosis Date    ADHD (attention deficit hyperactivity disorder)    Anxiety    CHF (congestive heart failure) (HCC)    CVA (cerebral vascular accident) (HCC) 07/02/2017   "left sided weakness" (07/02/2017)   Depression    History of kidney stones    Current Outpatient Medications  Medication Sig Dispense Refill   Alirocumab (PRALUENT) 150 MG/ML SOAJ Inject 1 pen into the skin every 14 (fourteen) days. 2 mL 11   amphetamine-dextroamphetamine (ADDERALL) 20 MG tablet Take 20 mg by mouth 3 (three) times daily.     aspirin EC 81 MG tablet Take 1 tablet (81 mg total) by mouth daily. Swallow whole. 90 tablet 3   atorvastatin (LIPITOR) 80 MG tablet take 1 tablet by mouth daily at 6 pm 90 tablet 3   carvedilol (COREG) 12.5 MG tablet Take 1 tablet (12.5 mg total) by mouth 2 (two) times daily with a meal. 180 tablet 3   clonazePAM (KLONOPIN) 1 MG tablet Take 1 mg by mouth as needed.     dapagliflozin propanediol (FARXIGA) 10 MG TABS tablet Take 1 tablet (10 mg total) by mouth daily before breakfast. 90 tablet 3   fenofibrate 160 MG tablet TAKE ONE TABLET BY MOUTH ONE TIME DAILY  90 tablet 0   folic acid (FOLVITE) 1 MG tablet Take 1 tablet (1 mg total) by mouth daily. 30 tablet 0  gabapentin (NEURONTIN) 300 MG capsule Take 1 capsule (300 mg total) by mouth 3 (three) times daily as needed. 30 capsule 3   Multiple Vitamin (MULTIVITAMIN WITH MINERALS) TABS tablet Take 1 tablet by mouth daily. 100 tablet 0   sacubitril-valsartan (ENTRESTO) 97-103 MG Take 1 tablet by mouth 2 (two) times daily. 180 tablet 3   spironolactone (ALDACTONE) 25 MG tablet TAKE HALF TABLET BY MOUTH AT BEDTIME 45 tablet 3   thiamine 100 MG tablet Take 1 tablet (100 mg total) by mouth daily. 30 tablet 0   traZODone (DESYREL) 50 MG tablet Take 100-150 mg by mouth at bedtime as needed for sleep.     No current facility-administered medications for this encounter.   No Known Allergies  Social History   Socioeconomic History   Marital status:  Married    Spouse name: Not on file   Number of children: Not on file   Years of education: Not on file   Highest education level: Not on file  Occupational History   Not on file  Tobacco Use   Smoking status: Former    Packs/day: 0.00    Years: 0.00    Pack years: 0.00    Types: Cigarettes, E-cigarettes   Smokeless tobacco: Former  Building services engineer Use: Former   Substances: Nicotine  Substance and Sexual Activity   Alcohol use: Yes    Alcohol/week: 1.0 standard drink    Types: 1 Glasses of wine per week    Comment: not weekly, social    Drug use: Yes    Frequency: 7.0 times per week    Types: Marijuana    Comment: 07/02/2017 "smokes marijuana a couple days/week" , "08/20/17 vapes marijuana juice"   Sexual activity: Yes  Other Topics Concern   Not on file  Social History Narrative   Lives at home with wife and his father lives with them   Right handed   Social Determinants of Health   Financial Resource Strain: Not on file  Food Insecurity: Not on file  Transportation Needs: Not on file  Physical Activity: Not on file  Stress: Not on file  Social Connections: Not on file  Intimate Partner Violence: Not on file   Family History  Problem Relation Age of Onset   Stroke Mother    Colon cancer Mother    Prostate cancer Father    Seizures Neg Hx    Vitals:   05/19/21 1213  BP: 110/80  Pulse: 82  SpO2: 97%  Weight: 92.3 kg (203 lb 6.4 oz)   Wt Readings from Last 3 Encounters:  05/19/21 92.3 kg (203 lb 6.4 oz)  10/25/20 96.5 kg (212 lb 12.8 oz)  11/06/19 89.9 kg (198 lb 3.2 oz)   PHYSICAL EXAM: General:  Well appearing. No resp difficulty HEENT: normal Neck: supple. no JVD. Carotids 2+ bilat; no bruits. No lymphadenopathy or thryomegaly appreciated. Cor: PMI nondisplaced. Regular rate & rhythm. No rubs, gallops or murmurs. Lungs: clear Abdomen: soft, nontender, nondistended. No hepatosplenomegaly. No bruits or masses. Good bowel sounds. Extremities: no  cyanosis, clubbing, rash, edema Neuro: alert & orientedx3, cranial nerves grossly intact. moves all 4 extremities w/o difficulty. Affect pleasant   ASSESSMENT & PLAN:  1. Chronic systolic CHF, ICM - Echo 07/02/17 with LVEF 20-25%, grade 2 DD, Trivial MR, Trivial TR - cMRI 12/18 EF 18% - EF 12/17/17 ~35% range. Echo 08/02/18: EF 35-40%. Now out of ICD range.  - Echo  10/25/20 EF 45-50% RV ok Personally reviewed -  Stable NYHA II  - Volume status ok off lasix - Continue Entresto 97/103 mg BID - Continue carvedilol to 12.5 mg BID  - Continue spiro 12.5 daily  - Continue Farxiga  2. CAD - LHC 07/05/17 with severe CAD with left-dominant system and occluded mLAD. - Mostly distal CAD. No good targets for CABG or PCI. Continue medical therapy. Based on scar pattern on MRI  - Continue statin/repatha and ASA - No s/s ischemia - Continue Farxiga  3. CVA - MRI Head 07/02/17 R pontine infarct and sub acute R temporal occipital/PCA territory infarct, along with old left occipital lobe/PCA territory infarct. - LV thrombus has resolved.Off Eliquis - Continues to struggle with memory loss. Suggested formal Neurocognitive testing with Dr. Daisy Blossom    4. Cocaine abuse - Remains abstinent . No change  5. Alcohol abuse - Encouraged responsible moderation. No change.   6. HLD - Continue atorvastatin 80 mg daily and repatha.  - Getting Repatha. PCP following lipids - Goal LDL  < 70  Arvilla Meres, MD 05/19/21 12:36 PM

## 2021-05-19 NOTE — Telephone Encounter (Signed)
I called pt and LMVM for him that since we have not seen him here for this that he would need a referral from his pcp to address.  If his pcp has already seen him and is willing to order referral then I would proceed with him as I know neuropsychologist are scheduling out 6 months.  If he wants [place] to see him for this issue then would need a new referral.  He is to call back for questions.

## 2021-05-19 NOTE — Telephone Encounter (Signed)
Pt called trying to apply for disability however he keeps getting denied. Pt was informed by his PCP that he could get a Neurocognitive test done. Pt wanting to know if he could have one done with [place], pt requesting a call back.

## 2021-05-19 NOTE — Addendum Note (Signed)
Encounter addended by: Noralee Space, RN on: 05/19/2021 12:51 PM  Actions taken: Clinical Note Signed

## 2021-05-19 NOTE — Addendum Note (Signed)
Encounter addended by: Noralee Space, RN on: 05/19/2021 1:01 PM  Actions taken: Order list changed, Diagnosis association updated

## 2021-05-19 NOTE — Patient Instructions (Signed)
Please follow up with Neurology  Your physician recommends that you schedule a follow-up appointment in: 9 months with an echocardiogram (July 2023), **PLEASE CALL OUR OFFICE IN MAY TO SCHEDULE THIS APPOINTMENT  If you have any questions or concerns before your next appointment please send [place] a message through [place] or call our office at [phone].    TO LEAVE A MESSAGE FOR THE NURSE SELECT OPTION 2, PLEASE LEAVE A MESSAGE INCLUDING: YOUR NAME DATE OF BIRTH CALL BACK NUMBER REASON FOR CALL**this is important as we prioritize the call backs  YOU WILL RECEIVE A CALL BACK THE SAME DAY AS LONG AS YOU CALL BEFORE 4:00 PM  At the [facility], you and your health needs are our priority. As part of our continuing mission to provide you with exceptional heart care, we have created designated Provider Care Teams. These Care Teams include your primary Cardiologist (physician) and Advanced Practice Providers (APPs- Physician Assistants and Nurse Practitioners) who all work together to provide you with the care you need, when you need it.   You may see any of the following providers on your designated Care Team at your next follow up: Dr Arvilla Meres Dr Carron Curie, NP Robbie Lis, [place] [facility] [place], [place] Karle Plumber, PharmD   Please be sure to bring in all your medications bottles to every appointment.

## 2021-05-24 ENCOUNTER — Ambulatory Visit: Payer: 59 | Admitting: Physical Therapy

## 2021-05-24 ENCOUNTER — Encounter: Payer: Self-pay | Admitting: Speech Pathology

## 2021-05-24 ENCOUNTER — Other Ambulatory Visit: Payer: Self-pay

## 2021-05-24 ENCOUNTER — Ambulatory Visit: Payer: 59 | Admitting: Speech Pathology

## 2021-05-24 ENCOUNTER — Encounter: Payer: Self-pay | Admitting: Physical Therapy

## 2021-05-24 DIAGNOSIS — R262 Difficulty in walking, not elsewhere classified: Secondary | ICD-10-CM

## 2021-05-24 DIAGNOSIS — M6281 Muscle weakness (generalized): Secondary | ICD-10-CM | POA: Diagnosis not present

## 2021-05-24 DIAGNOSIS — R41841 Cognitive communication deficit: Secondary | ICD-10-CM

## 2021-05-24 DIAGNOSIS — M6289 Other specified disorders of muscle: Secondary | ICD-10-CM

## 2021-05-24 DIAGNOSIS — R296 Repeated falls: Secondary | ICD-10-CM

## 2021-05-24 NOTE — Therapy (Signed)
[facility] [address] [place], [place], [address] Phone: [phone]   Fax:  [phone]  Physical Therapy Treatment  Patient Details  Name: Angel Davies MRN: [id number] Date of Birth: [birth year] Referring Provider (PT): Corliss Blacker   Encounter Date: 05/24/2021   PT End of Session - 05/24/21 1212     Visit Number 3    Number of Visits 30    Date for PT Re-Evaluation 07/30/21    PT Start Time 1015    PT Stop Time 1058    PT Time Calculation (min) 43 min    Activity Tolerance Patient tolerated treatment well    Behavior During Therapy [facility] for tasks assessed/performed             Past Medical History:  Diagnosis Date   ADHD (attention deficit hyperactivity disorder)    Anxiety    CHF (congestive heart failure) (HCC)    CVA (cerebral vascular accident) (HCC) 07/02/2017   "left sided weakness" (07/02/2017)   Depression    History of kidney stones     Past Surgical History:  Procedure Laterality Date   CYSTOSCOPY WITH URETEROSCOPY, STONE BASKETRY AND STENT PLACEMENT     RIGHT/LEFT HEART CATH AND CORONARY ANGIOGRAPHY N/A 07/05/2017   Procedure: RIGHT/LEFT HEART CATH AND CORONARY ANGIOGRAPHY;  Surgeon: Dolores Patty, MD;  [facility];  Service: Cardiovascular;  Laterality: N/A;    There were no vitals filed for this visit.   Subjective Assessment - 05/24/21 1015     Subjective Patient reports no falls, but he states he usually only falls when he is very challenged- He feels it is not a balance issue as much as his LLE giving way.    Limitations Walking    Patient Stated Goals no falls, walk better, less tightness    Currently in Pain? Yes    Pain Location Knee    Pain Orientation Left;Distal    Pain Descriptors / Indicators Burning    Pain Type Neuropathic pain    Pain Onset More than a month ago    Pain Frequency Intermittent                               [facility]  Adult PT Treatment/Exercise - 05/24/21 0001       High Level Balance   High Level Balance Activities Other (comment)   jump hops, moving feet out and back and then into alternating tandem. Increased difficulty with LLE control.   High Level Balance Comments single limb stance, stepping RLE across L with weight shift onto R foot, then return, alternating sides, stepping sideways over cones on the floor, trouble at times clearing iwht LLE.      Lumbar Exercises: Stretches   Passive Hamstring Stretch Right;Left;1 rep;30 seconds    Passive Hamstring Stretch Limitations Straight, leg in, leg out, all with strap    ITB Stretch Right;Left;1 rep;30 seconds    ITB Stretch Limitations supine    Figure 4 Stretch 1 rep;30 seconds;Supine;Other (comment)    Figure 4 Stretch Limitations with strap                       PT Short Term Goals - 05/24/21 1019       PT SHORT TERM GOAL #1   Title independent with initial HEP (texted to his phone)    Baseline Program updated to include strength  and coordination.    Time 1    Period Weeks    Status On-going    Target Date 05/31/21               PT Long Term Goals - 05/03/21 1159       PT LONG TERM GOAL #1   Title independent with advanced gym and flexibility program    Time 12    Period Weeks    Status New      PT LONG TERM GOAL #2   Title increase Berg balance test score to 47/56    Time 12    Period Weeks    Status New      PT LONG TERM GOAL #3   Title increase left LE SLR to 65 degrees    Time 12    Period Weeks    Status New      PT LONG TERM GOAL #4   Title increase left LE MMT to 4+/5    Time 12    Period Weeks    Status New                   Plan - 05/24/21 1213     Clinical Impression Statement Patient tolerated advanced blance activities as well as multiple LE stretching activities, therapist providing instruction for maximal benefit, including holding all stretches x 30 seconds, deep breathing  to improve relaxation. He demosntrates mildly decreased control and coordination of LLE when moving with increased speed, and in LLE WBing.    Stability/Clinical Decision Making Stable/Uncomplicated    Clinical Decision Making Low    Rehab Potential Good    PT Frequency 1x / week    PT Duration Other (comment)   11w   PT Treatment/Interventions ADLs/Self Care Home Management;Electrical Stimulation;Moist Heat;Gait training;Stair training;Functional mobility training;Therapeutic activities;Therapeutic exercise;Balance training;Neuromuscular re-education;Patient/family education;Manual techniques    PT Next Visit Plan Continue to challenge speed of movement, NM control and coordination in LLE as well as strength and flexibility.    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   Chronic systolic heart failure (HCC) 08/07/2017   Encounter for therapeutic drug monitoring 07/27/2017   Muscle pain    Supratherapeutic INR    Leukocytosis    Coronary artery disease involving native coronary artery of native heart without angina pectoris    Prediabetes    Hemorrhoids    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   Left hemiparesis (HCC)    Cardiomyopathy, ischemic    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 [place], [place], [address] Phone: [phone]   Fax:  [phone]  Name: Angel Davies MRN: [id number] Date of Birth: 04/19/1971

## 2021-05-24 NOTE — Therapy (Signed)
[facility] [address] [place], [place], [address] Phone: [phone]   Fax:  [phone]  Speech Language Pathology Treatment  Patient Details  Name: COLLIS THEDE MRN: [id number] Date of Birth: 1971-02-28 Referring Provider (SLP): Gweneth Dimitri MD   Encounter Date: 05/24/2021   End of Session - 05/24/21 0943     Visit Number 3    Number of Visits 4    Date for SLP Re-Evaluation 07/10/21    SLP Start Time 0931    SLP Stop Time  1011    SLP Time Calculation (min) 40 min    Activity Tolerance Patient tolerated treatment well             Past Medical History:  Diagnosis Date   ADHD (attention deficit hyperactivity disorder)    Anxiety    CHF (congestive heart failure) (HCC)    CVA (cerebral vascular accident) (HCC) 07/02/2017   "left sided weakness" (07/02/2017)   Depression    History of kidney stones     Past Surgical History:  Procedure Laterality Date   CYSTOSCOPY WITH URETEROSCOPY, STONE BASKETRY AND STENT PLACEMENT     RIGHT/LEFT HEART CATH AND CORONARY ANGIOGRAPHY N/A 07/05/2017   Procedure: RIGHT/LEFT HEART CATH AND CORONARY ANGIOGRAPHY;  Surgeon: Dolores Patty, MD;  [facility];  Service: Cardiovascular;  Laterality: N/A;    There were no vitals filed for this visit.   Subjective Assessment - 05/24/21 0941     Subjective "I have come to the realization that my memory isn't going to get better and I will have to use cheats to help. You guys are in the business of cheats."    Currently in Pain? No/denies                   ADULT SLP TREATMENT - 05/24/21 0947       General Information   Behavior/Cognition Alert;Cooperative;Distractible;Requires cueing      Treatment Provided   Treatment provided Cognitive-Linquistic      Cognitive-Linquistic Treatment   Treatment focused on Cognition    Skilled Treatment SLP trained pt in memory strategies this session. Pt  required supports for attention (cover sheet to reduce visual distraction, slowed rate/ more direct sentences). Pt reports the phone to be his primary external aid. *Pt noted to retell the same stories for the past 3 sessions with no awareness that he had previously shared stories.      Assessment / Recommendations / Plan   Plan Continue with current plan of care      Progression Toward Goals   Progression toward goals Progressing toward goals                SLP Short Term Goals - 05/11/21 0906       SLP SHORT TERM GOAL #1   Title Pt will recall 3 attention strategies to assist with increasing concentration across 2 sessions.    Time 2    Period Weeks    Status New    Target Date 05/25/21      SLP SHORT TERM GOAL #2   Title Pt will recall 3 memory strategies to assist with recall of important information across 2 sessions.    Time 2    Period Weeks    Status New    Target Date 05/25/21              SLP Long Term Goals - 05/11/21 [phone]  SLP LONG TERM GOAL #1   Title Pt will report use of 2+ attention strategies to assist with increase concentration and focus.    Time 4    Period Weeks    Status New    Target Date 06/08/21      SLP LONG TERM GOAL #2   Title Pt will increase score on MMQ to greater than 17 on "How I feel about my Memory".    Time 4    Period Weeks    Status New    Target Date 06/08/21      SLP LONG TERM GOAL #3   Title Pt will increase score on MMQ to greater than 4 on "Memory Mistakes".    Time 4    Period Weeks    Status New    Target Date 06/08/21              Plan - 05/24/21 0943     Clinical Impression Statement See tx note. Pt reports he did go home and set up a dedicated space for work to reduce environmental strategies. Cont with current POC.    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   Chronic systolic heart failure (HCC) 08/07/2017   Encounter for therapeutic drug monitoring 07/27/2017   Muscle pain    Supratherapeutic INR    Leukocytosis    Coronary artery disease involving native coronary artery of native heart without angina pectoris    Prediabetes    Hemorrhoids    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   Left hemiparesis (HCC)    Cardiomyopathy, ischemic    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 [place], [place], [address] Phone: [phone]   Fax:  [phone]   Name: ANDERS HOHMANN MRN: [id number] Date of Birth: April 03, 1971

## 2021-05-24 NOTE — Patient Instructions (Signed)
Access Code: [email or web address] URL: [email or web address] Date: 05/24/2021 Prepared by: Oley Balm  Exercises Supine Hamstring Stretch with Strap - 1 x daily - 7 x weekly - 1 sets - 3 reps - 30 hold Supine Figure 4 Piriformis Stretch - 1 x daily - 7 x weekly - 1 sets - 3 reps - 30 hold Supine Piriformis Stretch with Leg Straight - 1 x daily - 7 x weekly - 1 sets - 3 reps - 30 hold Thomas Stretch on Table - 1 x daily - 7 x weekly - 1 sets - 3 reps - 30 hold Side Step Overs with Cones and Unilateral Counter Support - 1 x daily - 7 x weekly - 1 sets - 10-20 reps Braided Sidestepping - 1 x daily - 7 x weekly - 1 sets - 10-20 reps

## 2021-05-31 ENCOUNTER — Ambulatory Visit: Payer: 59 | Attending: Family Medicine | Admitting: Speech Pathology

## 2021-05-31 ENCOUNTER — Ambulatory Visit: Payer: 59 | Admitting: Physical Therapy

## 2021-05-31 ENCOUNTER — Encounter: Payer: Self-pay | Admitting: Physical Therapy

## 2021-05-31 ENCOUNTER — Encounter: Payer: Self-pay | Admitting: Speech Pathology

## 2021-05-31 ENCOUNTER — Other Ambulatory Visit: Payer: Self-pay

## 2021-05-31 DIAGNOSIS — M6281 Muscle weakness (generalized): Secondary | ICD-10-CM

## 2021-05-31 DIAGNOSIS — M6289 Other specified disorders of muscle: Secondary | ICD-10-CM

## 2021-05-31 DIAGNOSIS — R296 Repeated falls: Secondary | ICD-10-CM | POA: Insufficient documentation

## 2021-05-31 DIAGNOSIS — R262 Difficulty in walking, not elsewhere classified: Secondary | ICD-10-CM | POA: Diagnosis present

## 2021-05-31 DIAGNOSIS — R41841 Cognitive communication deficit: Secondary | ICD-10-CM | POA: Insufficient documentation

## 2021-05-31 NOTE — Therapy (Addendum)
[facility] [address] [place], [place], [address] Phone: [phone]   Fax:  [phone]  Speech Language Pathology Treatment  Patient Details  Name: Angel Davies MRN: [id number] Date of Birth: 11-Oct-1970 Referring Provider (SLP): Gweneth Dimitri MD   Encounter Date: 05/31/2021   End of Session - 05/31/21 1127     Visit Number 4    Number of Visits 5   this was supposed to be 5 (forgot to include eval session)   Date for SLP Re-Evaluation 07/10/21    SLP Start Time 1015    SLP Stop Time  1100    SLP Time Calculation (min) 45 min    Activity Tolerance Patient tolerated treatment well             Past Medical History:  Diagnosis Date   ADHD (attention deficit hyperactivity disorder)    Anxiety    CHF (congestive heart failure) (HCC)    CVA (cerebral vascular accident) (HCC) 07/02/2017   "left sided weakness" (07/02/2017)   Depression    History of kidney stones     Past Surgical History:  Procedure Laterality Date   CYSTOSCOPY WITH URETEROSCOPY, STONE BASKETRY AND STENT PLACEMENT     RIGHT/LEFT HEART CATH AND CORONARY ANGIOGRAPHY N/A 07/05/2017   Procedure: RIGHT/LEFT HEART CATH AND CORONARY ANGIOGRAPHY;  Surgeon: Dolores Patty, MD;  [facility];  Service: Cardiovascular;  Laterality: N/A;    There were no vitals filed for this visit.   Subjective Assessment - 05/31/21 1126     Subjective "I didn't know I had speech therapy today."    Currently in Pain? No/denies                   ADULT SLP TREATMENT - 05/31/21 1107       General Information   Behavior/Cognition Pleasant mood;Alert      Treatment Provided   Treatment provided Cognitive-Linquistic      Cognitive-Linquistic Treatment   Treatment focused on Cognition    Skilled Treatment Pt practiced utilizing chunking internal memory strategy to practicing memorizing numbers. Pt was able to organize numbers into groups  of 3 and 4 and memorize them with minA verbal cues. Pt reported that he "sees himself using this for phone numbers". Pt was also instructed to give a list of grocery items and organize them by section in the store in order to practice organization with sustained attention. He required minA verbal cues in order to complete this task. SLP noted that pt required reminders in order to check off list of grocery items as pt went down the list.      Assessment / Recommendations / Plan   Plan Continue with current plan of care      Progression Toward Goals   Progression toward goals Progressing toward goals                SLP Short Term Goals - 05/31/21 1128       SLP SHORT TERM GOAL #1   Title Pt will recall 3 attention strategies to assist with increasing concentration across 2 sessions.    Time 2    Period Weeks    Status Achieved    Target Date 05/25/21      SLP SHORT TERM GOAL #2   Title Pt will recall 3 memory strategies to assist with recall of important information across 2 sessions.    Time 2    Period Weeks  Status Achieved    Target Date 05/25/21              SLP Long Term Goals - 05/11/21 0903       SLP LONG TERM GOAL #1   Title Pt will report use of 2+ attention strategies to assist with increase concentration and focus.    Time 4    Period Weeks    Status New    Target Date 06/08/21      SLP LONG TERM GOAL #2   Title Pt will increase score on MMQ to greater than 17 on "How I feel about my Memory".    Time 4    Period Weeks    Status New    Target Date 06/08/21      SLP LONG TERM GOAL #3   Title Pt will increase score on MMQ to greater than 4 on "Memory Mistakes".    Time 4    Period Weeks    Status New    Target Date 06/08/21              Plan - 05/31/21 1127     Clinical Impression Statement See tx note. Pt reports he has given up on restoring his memory ability and plans to use compensatory strategies instead. SLP re-edu on  neuroplasticity and connections in the brain. Cont with current POC.    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   Chronic systolic heart failure (HCC) 08/07/2017   Encounter for therapeutic drug monitoring 07/27/2017   Muscle pain    Supratherapeutic INR    Leukocytosis    Coronary artery disease involving native coronary artery of native heart without angina pectoris    Prediabetes    Hemorrhoids    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   Left hemiparesis (HCC)    Cardiomyopathy, ischemic    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. [place], [place], [address] Phone: [phone]   Fax:  [phone]   Name: Angel Davies MRN: [id number] Date of Birth: 11-15-1970

## 2021-05-31 NOTE — Therapy (Signed)
[facility] [address] [place], [place], [address] Phone: [phone]   Fax:  [phone]  Physical Therapy Treatment  Patient Details  Name: Angel Davies MRN: [id number] Date of Birth: [birth year] Referring Provider (PT): Corliss Blacker   Encounter Date: 05/31/2021   PT End of Session - 05/31/21 0955     Visit Number 4    Number of Visits 30    Date for PT Re-Evaluation 07/30/21    PT Start Time 0931    PT Stop Time 1014    PT Time Calculation (min) 43 min    Activity Tolerance Patient tolerated treatment well    Behavior During Therapy [facility] for tasks assessed/performed             Past Medical History:  Diagnosis Date   ADHD (attention deficit hyperactivity disorder)    Anxiety    CHF (congestive heart failure) (HCC)    CVA (cerebral vascular accident) (HCC) 07/02/2017   "left sided weakness" (07/02/2017)   Depression    History of kidney stones     Past Surgical History:  Procedure Laterality Date   CYSTOSCOPY WITH URETEROSCOPY, STONE BASKETRY AND STENT PLACEMENT     RIGHT/LEFT HEART CATH AND CORONARY ANGIOGRAPHY N/A 07/05/2017   Procedure: RIGHT/LEFT HEART CATH AND CORONARY ANGIOGRAPHY;  Surgeon: Dolores Patty, MD;  [facility];  Service: Cardiovascular;  Laterality: N/A;    There were no vitals filed for this visit.   Subjective Assessment - 05/31/21 0934     Subjective Patient reports no falls. He works out 1 hour, 3 times a week for cardio on a stepper, with high resistance. He is also spending an hour a week at the [employer].    Limitations Walking    Patient Stated Goals no falls, walk better, less tightness    Pain Onset More than a month ago                               [facility] Adult PT Treatment/Exercise - 05/31/21 0001       Lumbar Exercises: Aerobic   Recumbent Bike 1.9 x 5 minutes      Lumbar Exercises: Machines for Strengthening   Leg Press 2 x  10 reps at 60 #, U, R and L      Knee/Hip Exercises: Standing   Forward Step Up Both;2 sets;10 reps    Forward Step Up Limitations 8" step with Air Ex pad on top    SLS on Air Ex pad, with mini squats, occasional min a for stability when standing on L.    Rebounder Patient performed running, jump with abd/add, jump with alternating tandem stance. All with quick speed, normal coordination and control. 20 seconds each                       PT Short Term Goals - 05/31/21 0959       PT SHORT TERM GOAL #1   Title independent with initial HEP (texted to his phone)    Baseline Program updated to include strength and coordination.    Time 1    Period Weeks    Status On-going    Target Date 06/07/21               PT Long Term Goals - 05/31/21 1015       PT LONG TERM GOAL #1  Title independent with advanced gym and flexibility program    Time 8    Period Weeks    Status On-going    Target Date 07/26/21      PT LONG TERM GOAL #2   Title increase Berg balance test score to 47/56    Time 8    Period Weeks    Status On-going      PT LONG TERM GOAL #3   Title increase left LE SLR to 65 degrees    Time 12    Period Weeks    Status New      PT LONG TERM GOAL #4   Title increase left LE MMT to 4+/5    Time 12    Period Weeks    Status New                   Plan - 05/31/21 0956     Clinical Impression Statement Patient noted to perform  all activities very fast. Therapist encouraged patient to perform exercises slowly to increase challenge to control and coordination.    Stability/Clinical Decision Making Stable/Uncomplicated    Rehab Potential Good    PT Frequency 1x / week    PT Duration Other (comment)   11w   PT Treatment/Interventions ADLs/Self Care Home Management;Electrical Stimulation;Moist Heat;Gait training;Stair training;Functional mobility training;Therapeutic activities;Therapeutic exercise;Balance training;Neuromuscular  re-education;Patient/family education;Manual techniques    PT Next Visit Plan Continue to challenge speed of movement, NM control and coordination in LLE as well as strength and flexibility.    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   Chronic systolic heart failure (HCC) 08/07/2017   Encounter for therapeutic drug monitoring 07/27/2017   Muscle pain    Supratherapeutic INR    Leukocytosis    Coronary artery disease involving native coronary artery of native heart without angina pectoris    Prediabetes    Hemorrhoids    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   Left hemiparesis (HCC)    Cardiomyopathy, ischemic    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 [place], [place], [address] Phone: [phone]   Fax:  [phone]  Name: JOVAHN BREIT MRN: [id number] Date of Birth: [birth year]

## 2021-06-07 ENCOUNTER — Encounter: Payer: Self-pay | Admitting: Physical Therapy

## 2021-06-07 ENCOUNTER — Other Ambulatory Visit: Payer: Self-pay

## 2021-06-07 ENCOUNTER — Ambulatory Visit: Payer: 59 | Admitting: Physical Therapy

## 2021-06-07 ENCOUNTER — Ambulatory Visit: Payer: 59 | Admitting: Speech Pathology

## 2021-06-07 ENCOUNTER — Encounter: Payer: Self-pay | Admitting: Speech Pathology

## 2021-06-07 DIAGNOSIS — R296 Repeated falls: Secondary | ICD-10-CM

## 2021-06-07 DIAGNOSIS — R41841 Cognitive communication deficit: Secondary | ICD-10-CM

## 2021-06-07 DIAGNOSIS — R262 Difficulty in walking, not elsewhere classified: Secondary | ICD-10-CM

## 2021-06-07 DIAGNOSIS — M6281 Muscle weakness (generalized): Secondary | ICD-10-CM

## 2021-06-07 DIAGNOSIS — M6289 Other specified disorders of muscle: Secondary | ICD-10-CM

## 2021-06-07 NOTE — Therapy (Signed)
[facility] [address] [place], [place], [address] Phone: [phone]   Fax:  [phone]  Physical Therapy Treatment  Patient Details  Name: Angel Davies MRN: [id number] Date of Birth: April 14, 1971 Referring Provider (PT): Corliss Blacker   Encounter Date: 06/07/2021   PT End of Session - 06/07/21 1015     Visit Number 5    Number of Visits 30    Date for PT Re-Evaluation 07/30/21    Authorization Type [employer]    PT Start Time 1015    PT Stop Time 1100    PT Time Calculation (min) 45 min    Activity Tolerance Patient tolerated treatment well    Behavior During Therapy [facility] for tasks assessed/performed             Past Medical History:  Diagnosis Date   ADHD (attention deficit hyperactivity disorder)    Anxiety    CHF (congestive heart failure) (HCC)    CVA (cerebral vascular accident) (HCC) 07/02/2017   "left sided weakness" (07/02/2017)   Depression    History of kidney stones     Past Surgical History:  Procedure Laterality Date   CYSTOSCOPY WITH URETEROSCOPY, STONE BASKETRY AND STENT PLACEMENT     RIGHT/LEFT HEART CATH AND CORONARY ANGIOGRAPHY N/A 07/05/2017   Procedure: RIGHT/LEFT HEART CATH AND CORONARY ANGIOGRAPHY;  Surgeon: Dolores Patty, MD;  [facility];  Service: Cardiovascular;  Laterality: N/A;    There were no vitals filed for this visit.   Subjective Assessment - 06/07/21 1015     Subjective Patient frustrated due to inability to find work. No falls. Pt reports as depression sets in he moves around less and less.    Patient Stated Goals no falls, walk better, less tightness    Currently in Pain? No/denies                [facility] PT Assessment - 06/07/21 0001       Berg Balance Test   Standing Unsupported with Eyes Closed Able to stand 10 seconds safely    Standing Unsupported with Feet Together Able to place feet together independently but unable to hold for 30 seconds    Able to stand for 30 sec, then LOB   From Standing Position, Pick up Object from Floor Able to pick up shoe safely and easily    Turn 360 Degrees Able to turn 360 degrees safely one side only in 4 seconds or less    Standing on One Leg Able to lift leg independently and hold > 10 seconds                           [facility] Adult PT Treatment/Exercise - 06/07/21 0001       High Level Balance   High Level Balance Activities Other (comment)   jumps moving feet out and back and then into alternating tandem. Increased difficulty with LLE control.   High Level Balance Comments single limb stance, stepping RLE across L with weight shift onto R foot, then return, alternating sides, SLS with vecors; compliant surface: eyes closed; Wide and narrow BOS; standing with one foot on foam eyes closed bil; no difficulty with eyes open on 10 inch compliant surface. Full circles using visual target bil with no LOB after.      Lumbar Exercises: Aerobic   Recumbent Bike 2.0 x 5 minutes      Lumbar Exercises: Machines for  Strengthening   Leg Press 1 x 10 reps at 60 #, U, R and L   c/o of left quad pain; lowered to 50# but still painful     Knee/Hip Exercises: Standing   Forward Step Up Both;2 sets;10 reps    Forward Step Up Limitations 8" step with Air Ex pad on top                       PT Short Term Goals - 05/31/21 0959       PT SHORT TERM GOAL #1   Title independent with initial HEP (texted to his phone)    Baseline Program updated to include strength and coordination.    Time 1    Period Weeks    Status On-going    Target Date 06/07/21               PT Long Term Goals - 06/07/21 1226       PT LONG TERM GOAL #1   Title independent with advanced gym and flexibility program    Baseline -    Status On-going      PT LONG TERM GOAL #2   Title increase Berg balance test score to 47/56    Baseline -    Status On-going                   Plan -  06/07/21 1216     Clinical Impression Statement Patient is progressing with overall balance. His main difficulty today was when vision was taken away. He does fairly well on level ground, but is very challenged with compliant surfaces. He did well with turns using fixed visual cues. Static SLS has improved on the left but still challenging with vectors. No falls reported in at least 2 weeks per pt. He is very discouraged regarding his inability to find a job.    PT Frequency 1x / week    PT Treatment/Interventions ADLs/Self Care Home Management;Electrical Stimulation;Moist Heat;Gait training;Stair training;Functional mobility training;Therapeutic activities;Therapeutic exercise;Balance training;Neuromuscular re-education;Patient/family education;Manual techniques    PT Next Visit Plan complete remaining BERG reassessment, Continue to challenge speed of movement, NM control and coordination in LLE, vision restricted balance, narrow BOS, as well as strength and flexibility.    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   Chronic systolic heart failure (HCC) 08/07/2017   Encounter for therapeutic drug monitoring 07/27/2017   Muscle pain    Supratherapeutic INR    Leukocytosis    Coronary artery disease involving native coronary artery of native heart without angina pectoris    Prediabetes    Hemorrhoids    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   Left hemiparesis  (HCC)    Cardiomyopathy, ischemic    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 [place], [place], [address] Phone: [phone]   Fax:  [phone]  Name: KHALED HERDA MRN: [id number] Date of Birth: 1971-02-07

## 2021-06-07 NOTE — Therapy (Signed)
[place]. [place], [place], [address] Phone: [phone]   Fax:  [phone]  Speech Language Pathology Treatment & Discharge Summary  Patient Details  Name: Angel Davies MRN: [id number] Date of Birth: 31-Jan-1971 Referring Provider (SLP): Cari Caraway MD   Encounter Date: 06/07/2021   End of Session - 06/07/21 0937     Visit Number 5    Number of Visits 5   this was supposed to be 5 (forgot to include eval session)   Date for SLP Re-Evaluation 07/10/21    SLP Start Time 0930    SLP Stop Time  1010    SLP Time Calculation (min) 40 min    Activity Tolerance Patient tolerated treatment well             Past Medical History:  Diagnosis Date   ADHD (attention deficit hyperactivity disorder)    Anxiety    CHF (congestive heart failure) ([place])    CVA (cerebral vascular accident) ([place]) 07/02/2017   "left sided weakness" (07/02/2017)   Depression    History of kidney stones     Past Surgical History:  Procedure Laterality Date   CYSTOSCOPY WITH URETEROSCOPY, STONE BASKETRY AND STENT PLACEMENT     RIGHT/LEFT HEART CATH AND CORONARY ANGIOGRAPHY N/A 07/05/2017   Procedure: RIGHT/LEFT HEART CATH AND CORONARY ANGIOGRAPHY;  Surgeon: Jolaine Artist, MD;  Location: [facility];  Service: Cardiovascular;  Laterality: N/A;    There were no vitals filed for this visit.   Subjective Assessment - 06/07/21 0934     Subjective "I have 10 years of useless experience. I am a 50 yo stroke victim and I am not at the top of anyone's employment list."    Currently in Pain? No/denies                   ADULT SLP TREATMENT - 06/07/21 1012       General Information   Behavior/Cognition Pleasant mood;Alert      Treatment Provided   Treatment provided Cognitive-Linquistic      Cognitive-Linquistic Treatment   Treatment focused on Patient/family/caregiver education    Skilled Treatment Pt reported  he is feeling depressed because he can't find a job. SLP provided encouragment, discussed counseling, and provided an example of a "follow-up" letter for job applications. Provided edu on consistency of strategy use. Pt reported he is disappointed he is not back to his normal self. SLP provided edu that sometimes therapy is compensatory in nature vs. restorative and how compensatory techniques (internal memory strategies) can also be restorative in nature. Pt reports he doesn't feel like he can get anything else from [facility] = "how I feel"  16 and memory mistakes =10.      Assessment / Recommendations / Plan   Plan Discharge SLP treatment due to (comment)      Progression Toward Goals   Progression toward goals Goals met, education completed, patient discharged from [facility] - 05/31/21 1128       SLP SHORT TERM GOAL #1   Title Pt will recall 3 attention strategies to assist with increasing concentration across 2 sessions.    Time 2    Period Weeks    Status Achieved    Target Date 05/25/21      SLP SHORT TERM GOAL #2   Title Pt will recall  3 memory strategies to assist with recall of important information across 2 sessions.    Time 2    Period Weeks    Status Achieved    Target Date 05/25/21              SLP Long Term Goals - 06/07/21 1011       SLP LONG TERM GOAL #1   Title Pt will report use of 2+ attention strategies to assist with increase concentration and focus.    Time 4    Period Weeks    Status Achieved      SLP LONG TERM GOAL #2   Title Pt will increase score on MMQ to greater than 17 on "How I feel about my Memory".    Time 4    Period Weeks    Status Not Met      SLP LONG TERM GOAL #3   Title Pt will increase score on MMQ to greater than 4 on "Memory Mistakes".    Time 4    Period Weeks    Status Achieved              Plan - 06/07/21 1517     Clinical Impression Statement See tx note. Pt reports he is  occasionally using his attention strategies and memory strategies, but he reports, "my memory is never going to get better and I have come to terms with that." SLP explained the importance of consistency with strategy use. Pt in agreement. SLP to d/c from therapy.    Speech Therapy Frequency 1x /week    Duration 4 weeks    Treatment/Interventions Environmental controls;Patient/family education;Compensatory techniques;Internal/external aids;SLP instruction and feedback    Potential to Achieve Goals Good    Consulted and Agree with Plan of Care Patient             Patient will benefit from skilled therapeutic intervention in order to improve the following deficits and impairments:   Cognitive communication deficit    Problem List Patient Active Problem List   Diagnosis Date Noted   Tibial neuropathy, left 52/60/7371   Chronic systolic heart failure ([facility]) 08/07/2017   Encounter for therapeutic drug monitoring 07/27/2017   Muscle pain    Supratherapeutic INR    Leukocytosis    Coronary artery disease involving native coronary artery of native heart without angina pectoris    Prediabetes    Hemorrhoids    Right pontine stroke ([facility]) 07/06/2017   Left hemiparesis ([facility])    Cardiomyopathy, ischemic    LV (left ventricular) mural thrombus following MI ([place])    Myocardiopathy ([facility])    Hyperlipidemia    CVA (cerebral vascular accident) ([facility]) 07/02/2017   Alcohol use 07/02/2017   Anxiety 07/02/2017   Depression 07/02/2017   Acute hyperglycemia 07/02/2017   Elevated blood-pressure reading without diagnosis of hypertension 07/02/2017   ADHD 07/02/2017   Cocaine abuse ([facility]) 07/02/2017   Marijuana abuse 06/20/2017   Cocaine use 06/20/2017   SPEECH THERAPY DISCHARGE SUMMARY  Visits from Start of Care: 5  Current functional level related to goals / functional outcomes: Goals met.    Remaining deficits: Attention (baseline), and memory    Education / Equipment: Edu completed     Patient agrees to discharge. Patient goals were met. Patient is being discharged due to meeting the stated rehab goals.Angel Davies, CCC-SLP, CBIS   06/07/2021, 10:16 AM  [place]. [place],  [place], [address] Phone: [phone]   Fax:  [phone]   Name: Angel Davies MRN: [id number] Date of Birth: 24-Mar-1971

## 2021-06-08 ENCOUNTER — Encounter: Payer: 59 | Admitting: Physical Therapy

## 2021-06-09 ENCOUNTER — Other Ambulatory Visit: Payer: Self-pay | Admitting: Internal Medicine

## 2021-06-17 ENCOUNTER — Ambulatory Visit: Payer: 59 | Admitting: Physical Therapy

## 2021-06-17 ENCOUNTER — Telehealth: Payer: Self-pay | Admitting: Physical Therapy

## 2021-06-17 NOTE — Telephone Encounter (Signed)
No-show for this appointment- I called and spoke to him, he apologizes as he did not have this appt written down. Transferred him to the front desk to reschedule.  Madelaine Etienne, DPT, PN2   Supplemental Physical Therapist [facility]    Pager [phone] Acute Rehab Office [phone]

## 2021-06-21 ENCOUNTER — Ambulatory Visit: Payer: 59 | Admitting: Physical Therapy

## 2021-06-22 ENCOUNTER — Ambulatory Visit: Payer: 59 | Admitting: Physical Therapy

## 2021-06-27 ENCOUNTER — Ambulatory Visit: Payer: 59 | Admitting: Physical Therapy

## 2021-08-18 ENCOUNTER — Encounter (HOSPITAL_COMMUNITY): Payer: Self-pay

## 2021-08-18 ENCOUNTER — Other Ambulatory Visit (HOSPITAL_COMMUNITY): Payer: Self-pay

## 2021-08-18 MED ORDER — PRALUENT 150 MG/ML ~~LOC~~ SOAJ: SUBCUTANEOUS | 11 refills | Status: DC

## 2021-10-26 ENCOUNTER — Other Ambulatory Visit (HOSPITAL_COMMUNITY): Payer: Self-pay | Admitting: Internal Medicine

## 2022-01-30 ENCOUNTER — Other Ambulatory Visit (HOSPITAL_COMMUNITY): Payer: Self-pay

## 2022-02-04 ENCOUNTER — Other Ambulatory Visit: Payer: Self-pay | Admitting: Internal Medicine

## 2022-02-07 NOTE — Telephone Encounter (Signed)
Repatha management by lipid clinic Lipids managed by PCP

## 2022-03-07 ENCOUNTER — Other Ambulatory Visit (HOSPITAL_COMMUNITY): Payer: Self-pay | Admitting: Internal Medicine

## 2022-03-08 NOTE — Telephone Encounter (Signed)
Patient aware that his insurance is making his switch back to Repatha. He wanted to let Dr. Gala Romney know that his LDL-C came back at 7.

## 2022-06-09 ENCOUNTER — Telehealth: Payer: Self-pay | Admitting: Pharmacist

## 2022-06-09 NOTE — Telephone Encounter (Signed)
Repatha PA renewal received [id number]

## 2022-06-26 MED ORDER — REPATHA SURECLICK 140 MG/ML ~~LOC~~ SOAJ: 1.0000 mL | SUBCUTANEOUS | 3 refills | Status: DC

## 2022-06-27 NOTE — Telephone Encounter (Signed)
Approved through 06/08/23

## 2022-08-03 DIAGNOSIS — F900 Attention-deficit hyperactivity disorder, predominantly inattentive type: Secondary | ICD-10-CM | POA: Diagnosis not present

## 2022-08-03 DIAGNOSIS — G4700 Insomnia, unspecified: Secondary | ICD-10-CM | POA: Diagnosis not present

## 2022-08-03 DIAGNOSIS — F410 Panic disorder [episodic paroxysmal anxiety] without agoraphobia: Secondary | ICD-10-CM | POA: Diagnosis not present

## 2022-08-03 DIAGNOSIS — F3342 Major depressive disorder, recurrent, in full remission: Secondary | ICD-10-CM | POA: Diagnosis not present

## 2022-08-07 DIAGNOSIS — F411 Generalized anxiety disorder: Secondary | ICD-10-CM | POA: Diagnosis not present

## 2022-08-07 DIAGNOSIS — I5022 Chronic systolic (congestive) heart failure: Secondary | ICD-10-CM | POA: Diagnosis not present

## 2022-08-07 DIAGNOSIS — Z23 Encounter for immunization: Secondary | ICD-10-CM | POA: Diagnosis not present

## 2022-08-07 DIAGNOSIS — F5101 Primary insomnia: Secondary | ICD-10-CM | POA: Diagnosis not present

## 2022-08-07 DIAGNOSIS — F3341 Major depressive disorder, recurrent, in partial remission: Secondary | ICD-10-CM | POA: Diagnosis not present

## 2022-08-07 DIAGNOSIS — E119 Type 2 diabetes mellitus without complications: Secondary | ICD-10-CM | POA: Diagnosis not present

## 2022-09-05 DIAGNOSIS — E119 Type 2 diabetes mellitus without complications: Secondary | ICD-10-CM | POA: Diagnosis not present

## 2022-09-05 DIAGNOSIS — E669 Obesity, unspecified: Secondary | ICD-10-CM | POA: Diagnosis not present

## 2022-10-22 ENCOUNTER — Other Ambulatory Visit (HOSPITAL_COMMUNITY): Payer: Self-pay | Admitting: Internal Medicine

## 2022-11-06 DIAGNOSIS — E119 Type 2 diabetes mellitus without complications: Secondary | ICD-10-CM | POA: Diagnosis not present

## 2023-01-19 ENCOUNTER — Other Ambulatory Visit (HOSPITAL_COMMUNITY): Payer: Self-pay | Admitting: Internal Medicine

## 2023-01-25 DIAGNOSIS — F900 Attention-deficit hyperactivity disorder, predominantly inattentive type: Secondary | ICD-10-CM | POA: Diagnosis not present

## 2023-01-25 DIAGNOSIS — F5101 Primary insomnia: Secondary | ICD-10-CM | POA: Diagnosis not present

## 2023-01-25 DIAGNOSIS — F3342 Major depressive disorder, recurrent, in full remission: Secondary | ICD-10-CM | POA: Diagnosis not present

## 2023-01-25 DIAGNOSIS — F410 Panic disorder [episodic paroxysmal anxiety] without agoraphobia: Secondary | ICD-10-CM | POA: Diagnosis not present

## 2023-01-29 DIAGNOSIS — I5022 Chronic systolic (congestive) heart failure: Secondary | ICD-10-CM | POA: Diagnosis not present

## 2023-01-29 DIAGNOSIS — E119 Type 2 diabetes mellitus without complications: Secondary | ICD-10-CM | POA: Diagnosis not present

## 2023-01-29 DIAGNOSIS — E782 Mixed hyperlipidemia: Secondary | ICD-10-CM | POA: Diagnosis not present

## 2023-01-29 DIAGNOSIS — Z1159 Encounter for screening for other viral diseases: Secondary | ICD-10-CM | POA: Diagnosis not present

## 2023-01-29 DIAGNOSIS — I2510 Atherosclerotic heart disease of native coronary artery without angina pectoris: Secondary | ICD-10-CM | POA: Diagnosis not present

## 2023-01-30 ENCOUNTER — Other Ambulatory Visit (HOSPITAL_COMMUNITY): Payer: Self-pay | Admitting: Internal Medicine

## 2023-01-30 DIAGNOSIS — I5022 Chronic systolic (congestive) heart failure: Secondary | ICD-10-CM

## 2023-01-30 NOTE — Progress Notes (Signed)
Past due for echo and f/u with Dr Gala Romney, order placed, will arrange

## 2023-02-22 ENCOUNTER — Other Ambulatory Visit (HOSPITAL_COMMUNITY): Payer: Self-pay | Admitting: Internal Medicine

## 2023-02-26 ENCOUNTER — Other Ambulatory Visit (HOSPITAL_COMMUNITY): Payer: Self-pay | Admitting: Internal Medicine

## 2023-03-20 ENCOUNTER — Other Ambulatory Visit (HOSPITAL_COMMUNITY): Payer: Self-pay | Admitting: Internal Medicine

## 2023-03-23 ENCOUNTER — Other Ambulatory Visit (HOSPITAL_COMMUNITY): Payer: Self-pay | Admitting: Internal Medicine

## 2023-03-23 ENCOUNTER — Other Ambulatory Visit (HOSPITAL_COMMUNITY): Payer: Self-pay

## 2023-03-23 MED ORDER — ENTRESTO 97-103 MG PO TABS: 1.0000 | ORAL_TABLET | Freq: Two times a day (BID) | ORAL | 3 refills | Status: DC

## 2023-04-29 ENCOUNTER — Other Ambulatory Visit (HOSPITAL_COMMUNITY): Payer: Self-pay | Admitting: Internal Medicine

## 2023-04-30 ENCOUNTER — Ambulatory Visit (HOSPITAL_COMMUNITY)
Admission: RE | Admit: 2023-04-30 | Discharge: 2023-04-30 | Disposition: A | Discharge status: Home / Self Care | Payer: BC Managed Care – PPO | Source: Ambulatory Visit | Attending: Internal Medicine | Admitting: Internal Medicine

## 2023-04-30 ENCOUNTER — Encounter (HOSPITAL_COMMUNITY): Payer: Self-pay | Admitting: Internal Medicine

## 2023-04-30 VITALS — BP 136/80 | HR 84 | Wt 203.4 lb

## 2023-04-30 DIAGNOSIS — I5022 Chronic systolic (congestive) heart failure: Secondary | ICD-10-CM

## 2023-04-30 DIAGNOSIS — E669 Obesity, unspecified: Secondary | ICD-10-CM | POA: Insufficient documentation

## 2023-04-30 DIAGNOSIS — I110 Hypertensive heart disease with heart failure: Secondary | ICD-10-CM | POA: Insufficient documentation

## 2023-04-30 DIAGNOSIS — E785 Hyperlipidemia, unspecified: Secondary | ICD-10-CM | POA: Insufficient documentation

## 2023-04-30 DIAGNOSIS — F32A Depression, unspecified: Secondary | ICD-10-CM | POA: Diagnosis not present

## 2023-04-30 DIAGNOSIS — F909 Attention-deficit hyperactivity disorder, unspecified type: Secondary | ICD-10-CM | POA: Insufficient documentation

## 2023-04-30 DIAGNOSIS — F1010 Alcohol abuse, uncomplicated: Secondary | ICD-10-CM | POA: Diagnosis not present

## 2023-04-30 DIAGNOSIS — Z7984 Long term (current) use of oral hypoglycemic drugs: Secondary | ICD-10-CM | POA: Diagnosis not present

## 2023-04-30 DIAGNOSIS — Z8673 Personal history of transient ischemic attack (TIA), and cerebral infarction without residual deficits: Secondary | ICD-10-CM | POA: Insufficient documentation

## 2023-04-30 DIAGNOSIS — E782 Mixed hyperlipidemia: Secondary | ICD-10-CM | POA: Diagnosis not present

## 2023-04-30 DIAGNOSIS — Z86718 Personal history of other venous thrombosis and embolism: Secondary | ICD-10-CM | POA: Insufficient documentation

## 2023-04-30 DIAGNOSIS — I2510 Atherosclerotic heart disease of native coronary artery without angina pectoris: Secondary | ICD-10-CM

## 2023-04-30 DIAGNOSIS — F1410 Cocaine abuse, uncomplicated: Secondary | ICD-10-CM | POA: Diagnosis not present

## 2023-04-30 DIAGNOSIS — L905 Scar conditions and fibrosis of skin: Secondary | ICD-10-CM | POA: Diagnosis not present

## 2023-04-30 DIAGNOSIS — Z79899 Other long term (current) drug therapy: Secondary | ICD-10-CM | POA: Diagnosis not present

## 2023-04-30 DIAGNOSIS — F419 Anxiety disorder, unspecified: Secondary | ICD-10-CM | POA: Diagnosis not present

## 2023-04-30 NOTE — Progress Notes (Signed)
Advanced Heart Failure Clinic Note   Primary Care: Dr. Susa Simmonds Primary Cardiologist: Dr. Gala Romney  HPI:  Angel Davies is a 52 y.o. male with PMH of HTN, ADHD, obesity, anxiety, substance abuse, and depression.   Pt presented 07/02/2017 with CVA. UDS + for cocaine. Found to have new systolic CHF by echo.   Echo 07/02/17 with LVEF 20-25%, grade 2 DD, Trivial MR, Trivial TR -> HF team consulted.  With new CHF, pt underwent R/LHC as below that showed severe 3vD with no options for CABG/CAD. HF meds titrated as tolerated.  Here for f/u. Feels good. 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 Denies CP or SOB. Says he just doesn't have endurance on left side Gets weak. [facility] [facility] and does an hour on the stepper - will do 10K steps. Compliant with meds.    Echo  10/25/20 EF 45-50% RV ok    Echo 12/17/17 EF 30-35% No apical thrombus.  Echo 08/02/18 EF 35-40%.  Review of systems complete and found to be negative unless listed in HPI.   cMRI on 07/06/17 with 1) Severe LVE with EF 18% septal and apical akinesis diffuse hypokinesis 2) Apical thrombus measuring 11 mm x 8 mm 3) Full thickness scar involving the mid and distal septum and apex Subendocardial scar involving the basal inferior lateral wall   R/LHC 07/05/17 Mid RCA to Dist RCA lesion is 100% stenosed. Dist Cx lesion is 100% stenosed. Mid LAD lesion is 100% stenosed. Prox Cx to Dist Cx lesion is 30% stenosed.   Findings: Ao = 104/68 (84) LV = 110/9 RA = 1 RV = 19/3 PA = 19/9 (11) PCW = 5 Fick cardiac output/index = 4.3/2.1 PVR = 1.5 WU SVR = 2122 Ao sat = 96% PA sat = 64%, 66%   Assessment: 1. Severe CAD with left-dominant system and occluded mLAD 2. Ischemic CM with EF 25% by echo 3. Well compensated filling pressures with moderately reduced cardiac output  Past Medical History:  Diagnosis Date   ADHD (attention deficit hyperactivity disorder)    Anxiety    CHF (congestive heart failure) (HCC)    CVA (cerebral  vascular accident) (H[facility]) 07/02/2017   "left sided weakness" (07/02/2017)   Depression    History of kidney stones    Current Outpatient Medications  Medication Sig Dispense Refill   amphetamine-dextroamphetamine (ADDERALL) 20 MG tablet Take 20 mg by mouth 3 (three) times daily.     aspirin EC 81 MG tablet Take 1 tablet (81 mg total) by mouth daily. Swallow whole. 90 tablet 3   atorvastatin (LIPITOR) 80 MG tablet take 1 tablet by mouth daily at 6 pm 90 tablet 3   carvedilol (COREG) 12.5 MG tablet TAKE 1 TABLET (12.5 MG TOTAL) BY MOUTH 2 (TWO) TIMES DAILY WITH A MEAL. NEEDS FOLLOW UP APPOINTMENT FOR MORE REFILLS 180 tablet 1   clonazePAM (KLONOPIN) 1 MG tablet Take 1 mg by mouth as needed.     dapagliflozin propanediol (FARXIGA) 10 MG TABS tablet TAKE 1 TABLET BY MOUTH EVERY DAY BEFORE BREAKFAST 90 tablet 0   Evolocumab (REPATHA SURECLICK) 140 MG/ML SOAJ INJECT 1 PEN INTO THE SKIN EVERY 14 (FOURTEEN) DAYS. 6 mL 3   fenofibrate 160 MG tablet TAKE ONE TABLET BY MOUTH ONE TIME DAILY  90 tablet 0   folic acid (FOLVITE) 1 MG tablet Take 1 tablet (1 mg total) by mouth daily. 30 tablet 0   Multiple Vitamin (MULTIVITAMIN WITH MINERALS) TABS tablet Take 1 tablet by mouth daily.  100 tablet 0   sacubitril-valsartan (ENTRESTO) 97-103 MG Take 1 tablet by mouth 2 (two) times daily. 180 tablet 3   spironolactone (ALDACTONE) 25 MG tablet TAKE 1/2 TABLET BY MOUTH AT BEDTIME 45 tablet 0   thiamine 100 MG tablet Take 1 tablet (100 mg total) by mouth daily. 30 tablet 0   traZODone (DESYREL) 50 MG tablet Take 100-150 mg by mouth at bedtime as needed for sleep.     No current facility-administered medications for this encounter.   No Known Allergies  Social History   Socioeconomic History   Marital status: Married    Spouse name: Not on file   Number of children: Not on file   Years of education: Not on file   Highest education level: Not on file  Occupational History   Not on file  Tobacco Use   Smoking  status: Former    Current packs/day: 0.00    Types: Cigarettes, E-cigarettes   Smokeless tobacco: Former  Building services engineer status: Former   Substances: Nicotine  Substance and Sexual Activity   Alcohol use: Yes    Alcohol/week: 1.0 standard drink of alcohol    Types: 1 Glasses of wine per week    Comment: not weekly, social    Drug use: Yes    Frequency: 1.0 times per week    Types: Marijuana    Comment: 05/24/21: "When I want to be hungry." 07/02/2017 "smokes marijuana a couple days/week" , "08/20/17 vapes marijuana juice"   Sexual activity: Yes  Other Topics Concern   Not on file  Social History Narrative   Lives at home with wife and his father lives with them   Right handed   Social Determinants of Health   Financial Resource Strain: Not on file  Food Insecurity: Not on file  Transportation Needs: Not on file  Physical Activity: Not on file  Stress: Not on file  Social Connections: Unknown (12/13/2021)   Received [facility]   Social Network    Social Network: Not on file  Intimate Partner Violence: Unknown (11/04/2021)   Received [facility], [facility]   HITS    Physically Hurt: Not on file    Insult or Talk Down To: Not on file    Threaten Physical Harm: Not on file    Scream or Curse: Not on file   Family History  Problem Relation Age of Onset   Stroke Mother    Colon cancer Mother    Prostate cancer Father    Seizures Neg Hx    Vitals:   04/30/23 1015  BP: 136/80  Pulse: 84  SpO2: 97%  Weight: 92.3 kg (203 lb 6.4 oz)    Wt Readings from Last 3 Encounters:  04/30/23 92.3 kg (203 lb 6.4 oz)  05/19/21 92.3 kg (203 lb 6.4 oz)  10/25/20 96.5 kg (212 lb 12.8 oz)   PHYSICAL EXAM: General:  Well appearing. No resp difficulty HEENT: normal Neck: supple. no JVD. Carotids 2+ bilat; no bruits. No lymphadenopathy or thryomegaly appreciated. Cor: PMI nondisplaced. Regular rate & rhythm. No rubs, gallops or murmurs. Lungs:  clear Abdomen: soft, nontender, nondistended. No hepatosplenomegaly. No bruits or masses. Good bowel sounds. Extremities: no cyanosis, clubbing, rash, edema Neuro: alert & orientedx3, cranial nerves grossly intact. moves all 4 extremities w/o difficulty. Affect pleasant  ECG: NSR 86 non-specific ST abnormalities. Personally reviewed  ASSESSMENT & PLAN:  1. Chronic systolic CHF, ICM - Echo 07/02/17 with LVEF 20-25%,  grade 2 DD, Trivial MR, Trivial TR - cMRI 12/18 EF 18% - EF 12/17/17 ~35% range. Echo 08/02/18: EF 35-40%. Now out of ICD range.  - Echo  10/25/20 EF 45-50% RV ok Personally reviewed - Stable NYHA II - Volume status ok off lasix - Continue Entresto 97/103 mg BID - Continue carvedilol to 12.5 mg BID  - Continue spiro 12.5 daily  - Continue Farxiga - Due for repeat echo   2. CAD - LHC 07/05/17 with severe CAD with left-dominant system and occluded mLAD. - Mostly distal CAD. No good targets for CABG or PCI. Continue medical therapy. Based on scar pattern on MRI  - Continue statin/repatha/ASA - No s/s ischemia - Continue Farxiga  3. CVA - MRI Head 07/02/17 R pontine infarct and sub acute R temporal occipital/PCA territory infarct, along with old left occipital lobe/PCA territory infarct. - LV thrombus has resolved.Off Eliquis   4. Cocaine abuse - Remains abstinent . No change  5. Alcohol abuse - Rare ETOH  6. HLD - Continue atorvastatin 80 mg daily and repatha.  - Getting Repatha. PCP following lipids. Last LDL = 7 (seven)  - Goal LDL  < 70  Arvilla Meres, MD 04/30/23 10:33 AM

## 2023-04-30 NOTE — Patient Instructions (Signed)
Great to see you today!!!  No changes, please continue current medications  Your physician has requested that you have an echocardiogram. Echocardiography is a painless test that uses sound waves to create images of your heart. It provides your doctor with information about the size and shape of your heart and how well your heart's chambers and valves are working. This procedure takes approximately one hour. There are no restrictions for this procedure. Please do NOT wear cologne, perfume, aftershave, or lotions (deodorant is allowed). Please arrive 15 minutes prior to your appointment time.  Your physician recommends that you schedule a follow-up appointment in: 1 year (Sept 2025), **PLEASE CALL OUR OFFICE ON JULY TO SCHEDULE THIS APPOINTMENT  If you have any questions or concerns before your next appointment please send [place] a message through [place] or call our office at [phone].    TO LEAVE A MESSAGE FOR THE NURSE SELECT OPTION 2, PLEASE LEAVE A MESSAGE INCLUDING: YOUR NAME DATE OF BIRTH CALL BACK NUMBER REASON FOR CALL**this is important as we prioritize the call backs  YOU WILL RECEIVE A CALL BACK THE SAME DAY AS LONG AS YOU CALL BEFORE 4:00 PM  At the [facility], you and your health needs are our priority. As part of our continuing mission to provide you with exceptional heart care, we have created designated Provider Care Teams. These Care Teams include your primary Cardiologist (physician) and Advanced Practice Providers (APPs- Physician Assistants and Nurse Practitioners) who all work together to provide you with the care you need, when you need it.   You may see any of the following providers on your designated Care Team at your next follow up: Dr Arvilla Meres Dr Marca Ancona Dr. Marcos Eke, NP Robbie Lis, [place] [facility] [place], [place] Brynda Peon, NP Karle Plumber, PharmD   Please be sure to bring in all your  medications bottles to every appointment.    Thank you for choosing [facility]

## 2023-04-30 NOTE — Addendum Note (Signed)
Encounter addended by: Noralee Space, RN on: 04/30/2023 10:52 AM  Actions taken: Order list changed, Diagnosis association updated, Clinical Note Signed

## 2023-05-10 ENCOUNTER — Other Ambulatory Visit (HOSPITAL_COMMUNITY): Payer: Self-pay | Admitting: Internal Medicine

## 2023-05-30 ENCOUNTER — Ambulatory Visit (HOSPITAL_COMMUNITY)
Admission: RE | Admit: 2023-05-30 | Discharge: 2023-05-30 | Disposition: A | Discharge status: Home / Self Care | Payer: BC Managed Care – PPO | Source: Ambulatory Visit | Attending: Internal Medicine | Admitting: Internal Medicine

## 2023-05-30 DIAGNOSIS — I110 Hypertensive heart disease with heart failure: Secondary | ICD-10-CM | POA: Insufficient documentation

## 2023-05-30 DIAGNOSIS — I5022 Chronic systolic (congestive) heart failure: Secondary | ICD-10-CM | POA: Diagnosis not present

## 2023-05-30 LAB — ECHOCARDIOGRAM COMPLETE
AR max vel: 2.3 cm2
AV Area VTI: 2.3 cm2
AV Area mean vel: 2.24 cm2
AV Mean grad: 2 mm[Hg]
AV Peak grad: 4.5 mm[Hg]
Ao pk vel: 1.06 m/s
Area-P 1/2: 3.99 cm2
Calc EF: 50.6 %
S' Lateral: 4 cm
Single Plane A2C EF: 43.7 %
Single Plane A4C EF: 54.6 %

## 2023-05-30 NOTE — Progress Notes (Signed)
  Echocardiogram 2D Echocardiogram has been performed.  Angel Davies 05/30/2023, 10:24 AM

## 2023-06-04 DIAGNOSIS — Z23 Encounter for immunization: Secondary | ICD-10-CM | POA: Diagnosis not present

## 2023-06-04 DIAGNOSIS — I5022 Chronic systolic (congestive) heart failure: Secondary | ICD-10-CM | POA: Diagnosis not present

## 2023-06-04 DIAGNOSIS — E119 Type 2 diabetes mellitus without complications: Secondary | ICD-10-CM | POA: Diagnosis not present

## 2023-06-04 DIAGNOSIS — I2510 Atherosclerotic heart disease of native coronary artery without angina pectoris: Secondary | ICD-10-CM | POA: Diagnosis not present

## 2023-07-03 DIAGNOSIS — F410 Panic disorder [episodic paroxysmal anxiety] without agoraphobia: Secondary | ICD-10-CM | POA: Diagnosis not present

## 2023-07-03 DIAGNOSIS — F5101 Primary insomnia: Secondary | ICD-10-CM | POA: Diagnosis not present

## 2023-07-03 DIAGNOSIS — F3342 Major depressive disorder, recurrent, in full remission: Secondary | ICD-10-CM | POA: Diagnosis not present

## 2023-07-03 DIAGNOSIS — F900 Attention-deficit hyperactivity disorder, predominantly inattentive type: Secondary | ICD-10-CM | POA: Diagnosis not present

## 2023-07-10 DIAGNOSIS — Z23 Encounter for immunization: Secondary | ICD-10-CM | POA: Diagnosis not present

## 2023-07-22 ENCOUNTER — Other Ambulatory Visit (HOSPITAL_COMMUNITY): Payer: Self-pay | Admitting: Internal Medicine

## 2023-10-20 ENCOUNTER — Other Ambulatory Visit (HOSPITAL_COMMUNITY): Payer: Self-pay | Admitting: Internal Medicine

## 2023-11-30 DIAGNOSIS — Z125 Encounter for screening for malignant neoplasm of prostate: Secondary | ICD-10-CM | POA: Diagnosis not present

## 2023-11-30 DIAGNOSIS — E119 Type 2 diabetes mellitus without complications: Secondary | ICD-10-CM | POA: Diagnosis not present

## 2023-11-30 DIAGNOSIS — E782 Mixed hyperlipidemia: Secondary | ICD-10-CM | POA: Diagnosis not present

## 2023-12-05 DIAGNOSIS — F411 Generalized anxiety disorder: Secondary | ICD-10-CM | POA: Diagnosis not present

## 2023-12-05 DIAGNOSIS — I5022 Chronic systolic (congestive) heart failure: Secondary | ICD-10-CM | POA: Diagnosis not present

## 2023-12-05 DIAGNOSIS — F331 Major depressive disorder, recurrent, moderate: Secondary | ICD-10-CM | POA: Diagnosis not present

## 2023-12-05 DIAGNOSIS — E782 Mixed hyperlipidemia: Secondary | ICD-10-CM | POA: Diagnosis not present

## 2023-12-05 DIAGNOSIS — E1165 Type 2 diabetes mellitus with hyperglycemia: Secondary | ICD-10-CM | POA: Diagnosis not present

## 2023-12-25 DIAGNOSIS — F900 Attention-deficit hyperactivity disorder, predominantly inattentive type: Secondary | ICD-10-CM | POA: Diagnosis not present

## 2023-12-25 DIAGNOSIS — F5101 Primary insomnia: Secondary | ICD-10-CM | POA: Diagnosis not present

## 2023-12-25 DIAGNOSIS — F3342 Major depressive disorder, recurrent, in full remission: Secondary | ICD-10-CM | POA: Diagnosis not present

## 2023-12-25 DIAGNOSIS — F4321 Adjustment disorder with depressed mood: Secondary | ICD-10-CM | POA: Diagnosis not present

## 2024-01-15 DIAGNOSIS — I2510 Atherosclerotic heart disease of native coronary artery without angina pectoris: Secondary | ICD-10-CM | POA: Diagnosis not present

## 2024-01-15 DIAGNOSIS — Z8673 Personal history of transient ischemic attack (TIA), and cerebral infarction without residual deficits: Secondary | ICD-10-CM | POA: Diagnosis not present

## 2024-01-15 DIAGNOSIS — Z800 Family history of malignant neoplasm of digestive organs: Secondary | ICD-10-CM | POA: Diagnosis not present

## 2024-01-15 DIAGNOSIS — Z86018 Personal history of other benign neoplasm: Secondary | ICD-10-CM | POA: Diagnosis not present

## 2024-04-13 ENCOUNTER — Other Ambulatory Visit (HOSPITAL_COMMUNITY): Payer: Self-pay | Admitting: Internal Medicine

## 2024-05-13 ENCOUNTER — Other Ambulatory Visit (HOSPITAL_COMMUNITY): Payer: Self-pay | Admitting: Internal Medicine

## 2024-06-07 ENCOUNTER — Inpatient Hospital Stay: Admit: 2024-06-07 | Admitting: General Surgery

## 2024-06-07 ENCOUNTER — Emergency Department (HOSPITAL_COMMUNITY): Admitting: Anesthesiology

## 2024-06-07 ENCOUNTER — Encounter (HOSPITAL_BASED_OUTPATIENT_CLINIC_OR_DEPARTMENT_OTHER): Payer: Self-pay

## 2024-06-07 ENCOUNTER — Inpatient Hospital Stay (HOSPITAL_BASED_OUTPATIENT_CLINIC_OR_DEPARTMENT_OTHER)
Admission: EM | Admit: 2024-06-07 | Discharge: 2024-06-09 | DRG: 853 | Disposition: A | Discharge status: Home / Self Care | Attending: General Surgery | Admitting: General Surgery

## 2024-06-07 ENCOUNTER — Other Ambulatory Visit: Payer: Self-pay

## 2024-06-07 ENCOUNTER — Encounter (HOSPITAL_COMMUNITY): Admission: EM | Disposition: A | Discharge status: Home / Self Care | Payer: Self-pay | Source: Home / Self Care

## 2024-06-07 ENCOUNTER — Emergency Department (HOSPITAL_BASED_OUTPATIENT_CLINIC_OR_DEPARTMENT_OTHER)

## 2024-06-07 DIAGNOSIS — I69354 Hemiplegia and hemiparesis following cerebral infarction affecting left non-dominant side: Secondary | ICD-10-CM | POA: Diagnosis not present

## 2024-06-07 DIAGNOSIS — K6389 Other specified diseases of intestine: Secondary | ICD-10-CM | POA: Diagnosis not present

## 2024-06-07 DIAGNOSIS — E663 Overweight: Secondary | ICD-10-CM | POA: Diagnosis present

## 2024-06-07 DIAGNOSIS — A419 Sepsis, unspecified organism: Principal | ICD-10-CM | POA: Diagnosis present

## 2024-06-07 DIAGNOSIS — E119 Type 2 diabetes mellitus without complications: Secondary | ICD-10-CM | POA: Diagnosis present

## 2024-06-07 DIAGNOSIS — Z8042 Family history of malignant neoplasm of prostate: Secondary | ICD-10-CM

## 2024-06-07 DIAGNOSIS — K381 Appendicular concretions: Secondary | ICD-10-CM | POA: Diagnosis present

## 2024-06-07 DIAGNOSIS — K760 Fatty (change of) liver, not elsewhere classified: Secondary | ICD-10-CM | POA: Diagnosis not present

## 2024-06-07 DIAGNOSIS — Z800 Family history of malignant neoplasm of digestive organs: Secondary | ICD-10-CM

## 2024-06-07 DIAGNOSIS — K3532 Acute appendicitis with perforation and localized peritonitis, without abscess: Secondary | ICD-10-CM | POA: Diagnosis present

## 2024-06-07 DIAGNOSIS — K35201 Acute appendicitis with generalized peritonitis, with perforation, without abscess: Secondary | ICD-10-CM | POA: Diagnosis not present

## 2024-06-07 DIAGNOSIS — E785 Hyperlipidemia, unspecified: Secondary | ICD-10-CM | POA: Diagnosis present

## 2024-06-07 DIAGNOSIS — Z7982 Long term (current) use of aspirin: Secondary | ICD-10-CM

## 2024-06-07 DIAGNOSIS — R1031 Right lower quadrant pain: Secondary | ICD-10-CM | POA: Diagnosis not present

## 2024-06-07 DIAGNOSIS — I509 Heart failure, unspecified: Secondary | ICD-10-CM | POA: Diagnosis not present

## 2024-06-07 DIAGNOSIS — Z87442 Personal history of urinary calculi: Secondary | ICD-10-CM

## 2024-06-07 DIAGNOSIS — Z823 Family history of stroke: Secondary | ICD-10-CM

## 2024-06-07 DIAGNOSIS — K3580 Unspecified acute appendicitis: Secondary | ICD-10-CM | POA: Diagnosis not present

## 2024-06-07 DIAGNOSIS — Z79899 Other long term (current) drug therapy: Secondary | ICD-10-CM | POA: Diagnosis not present

## 2024-06-07 DIAGNOSIS — Z87891 Personal history of nicotine dependence: Secondary | ICD-10-CM | POA: Diagnosis not present

## 2024-06-07 DIAGNOSIS — K353 Acute appendicitis with localized peritonitis, without perforation or gangrene: Secondary | ICD-10-CM | POA: Diagnosis not present

## 2024-06-07 DIAGNOSIS — Z6828 Body mass index (BMI) 28.0-28.9, adult: Secondary | ICD-10-CM

## 2024-06-07 DIAGNOSIS — I2510 Atherosclerotic heart disease of native coronary artery without angina pectoris: Secondary | ICD-10-CM | POA: Diagnosis not present

## 2024-06-07 HISTORY — PX: LAPAROSCOPIC APPENDECTOMY: SHX408

## 2024-06-07 HISTORY — DX: Type 2 diabetes mellitus without complications: E11.9

## 2024-06-07 LAB — CBC
HCT: 40.7 % (ref 39.0–52.0)
Hemoglobin: 13.7 g/dL (ref 13.0–17.0)
MCH: 30 pg (ref 26.0–34.0)
MCHC: 33.7 g/dL (ref 30.0–36.0)
MCV: 89.3 fL (ref 80.0–100.0)
Platelets: 277 K/uL (ref 150–400)
RBC: 4.56 MIL/uL (ref 4.22–5.81)
RDW: 12.9 % (ref 11.5–15.5)
WBC: 15.6 K/uL — ABNORMAL HIGH (ref 4.0–10.5)
nRBC: 0 % (ref 0.0–0.2)

## 2024-06-07 LAB — CBC WITH DIFFERENTIAL/PLATELET
Abs Immature Granulocytes: 0.06 K/uL (ref 0.00–0.07)
Basophils Absolute: 0 K/uL (ref 0.0–0.1)
Basophils Relative: 0 %
Eosinophils Absolute: 0 K/uL (ref 0.0–0.5)
Eosinophils Relative: 0 %
HCT: 46.7 % (ref 39.0–52.0)
Hemoglobin: 15.8 g/dL (ref 13.0–17.0)
Immature Granulocytes: 0 %
Lymphocytes Relative: 15 %
Lymphs Abs: 2.5 K/uL (ref 0.7–4.0)
MCH: 29.9 pg (ref 26.0–34.0)
MCHC: 33.8 g/dL (ref 30.0–36.0)
MCV: 88.4 fL (ref 80.0–100.0)
Monocytes Absolute: 2.1 K/uL — ABNORMAL HIGH (ref 0.1–1.0)
Monocytes Relative: 13 %
Neutro Abs: 11.5 K/uL — ABNORMAL HIGH (ref 1.7–7.7)
Neutrophils Relative %: 72 %
Platelets: 311 K/uL (ref 150–400)
RBC: 5.28 MIL/uL (ref 4.22–5.81)
RDW: 13.1 % (ref 11.5–15.5)
WBC: 16.2 K/uL — ABNORMAL HIGH (ref 4.0–10.5)
nRBC: 0 % (ref 0.0–0.2)

## 2024-06-07 LAB — COMPREHENSIVE METABOLIC PANEL WITH GFR
ALT: 30 U/L (ref 0–44)
AST: 26 U/L (ref 15–41)
Albumin: 4.5 g/dL (ref 3.5–5.0)
Alkaline Phosphatase: 82 U/L (ref 38–126)
Anion gap: 14 (ref 5–15)
BUN: 9 mg/dL (ref 6–20)
CO2: 27 mmol/L (ref 22–32)
Calcium: 10.7 mg/dL — ABNORMAL HIGH (ref 8.9–10.3)
Chloride: 92 mmol/L — ABNORMAL LOW (ref 98–111)
Creatinine, Ser: 0.96 mg/dL (ref 0.61–1.24)
GFR, Estimated: >60 mL/min (ref >60)
Glucose, Bld: 221 mg/dL — ABNORMAL HIGH (ref 70–99)
Potassium: 4.1 mmol/L (ref 3.5–5.1)
Sodium: 133 mmol/L — ABNORMAL LOW (ref 135–145)
Total Bilirubin: 1 mg/dL (ref 0.0–1.2)
Total Protein: 8.7 g/dL — ABNORMAL HIGH (ref 6.5–8.1)

## 2024-06-07 LAB — URINALYSIS, ROUTINE W REFLEX MICROSCOPIC
Bilirubin Urine: NEGATIVE
Glucose, UA: >1000 mg/dL — AB
Leukocytes,Ua: NEGATIVE
Nitrite: NEGATIVE
Protein, ur: 100 mg/dL — AB
RBC / HPF: >50 RBC/hpf (ref 0–5)
Specific Gravity, Urine: 1.039 — ABNORMAL HIGH (ref 1.005–1.030)
pH: 5.5 (ref 5.0–8.0)

## 2024-06-07 LAB — CREATININE, SERUM
Creatinine, Ser: 1.6 mg/dL — ABNORMAL HIGH (ref 0.61–1.24)
GFR, Estimated: 51 mL/min — ABNORMAL LOW (ref >60)

## 2024-06-07 LAB — HIV ANTIBODY (ROUTINE TESTING W REFLEX): HIV Screen 4th Generation wRfx: NONREACTIVE

## 2024-06-07 LAB — LACTIC ACID, PLASMA
Lactic Acid, Venous: 1.2 mmol/L (ref 0.5–1.9)
Lactic Acid, Venous: 1.9 mmol/L (ref 0.5–1.9)

## 2024-06-07 LAB — LIPASE, BLOOD: Lipase: 88 U/L — ABNORMAL HIGH (ref 11–51)

## 2024-06-07 SURGERY — APPENDECTOMY, LAPAROSCOPIC
Anesthesia: General

## 2024-06-07 MED ORDER — INSULIN ASPART 100 UNIT/ML IJ SOLN
4.0000 [IU] | Freq: Once | INTRAMUSCULAR | Status: AC
  Administered 2024-06-07: 4 [IU] via SUBCUTANEOUS

## 2024-06-07 MED ORDER — DEXTROSE-SODIUM CHLORIDE 5-0.45 % IV SOLN: INTRAVENOUS | Status: DC

## 2024-06-07 MED ORDER — MIDAZOLAM HCL (PF) 2 MG/2ML IJ SOLN
INTRAMUSCULAR | Status: DC | PRN
  Administered 2024-06-07: 2 mg via INTRAVENOUS

## 2024-06-07 MED ORDER — CHLORHEXIDINE GLUCONATE 0.12 % MT SOLN
15.0000 mL | Freq: Once | OROMUCOSAL | Status: AC
  Administered 2024-06-07: 15 mL via OROMUCOSAL
  Filled 2024-06-07: qty 15

## 2024-06-07 MED ORDER — FENTANYL CITRATE (PF) 100 MCG/2ML IJ SOLN
INTRAMUSCULAR | Status: AC
  Filled 2024-06-07: qty 2

## 2024-06-07 MED ORDER — SUCCINYLCHOLINE CHLORIDE 200 MG/10ML IV SOSY
PREFILLED_SYRINGE | INTRAVENOUS | Status: DC | PRN
  Administered 2024-06-07: 160 mg via INTRAVENOUS

## 2024-06-07 MED ORDER — AMPHETAMINE-DEXTROAMPHETAMINE 10 MG PO TABS
20.0000 mg | ORAL_TABLET | Freq: Three times a day (TID) | ORAL | Status: DC
  Filled 2024-06-07: qty 2

## 2024-06-07 MED ORDER — SUCCINYLCHOLINE CHLORIDE 200 MG/10ML IV SOSY
PREFILLED_SYRINGE | INTRAVENOUS | Status: AC
Start: 2024-06-07 — End: 2024-06-07
  Filled 2024-06-07: qty 10

## 2024-06-07 MED ORDER — SPIRONOLACTONE 12.5 MG HALF TABLET
12.5000 mg | ORAL_TABLET | Freq: Every day | ORAL | Status: DC
  Administered 2024-06-07 – 2024-06-09 (×3): 12.5 mg via ORAL
  Filled 2024-06-07 (×4): qty 1

## 2024-06-07 MED ORDER — PROPOFOL 10 MG/ML IV BOLUS
INTRAVENOUS | Status: DC | PRN
  Administered 2024-06-07: 140 mg via INTRAVENOUS

## 2024-06-07 MED ORDER — TRAZODONE HCL 50 MG PO TABS: 100.0000 mg | ORAL_TABLET | Freq: Every evening | ORAL | Status: DC | PRN

## 2024-06-07 MED ORDER — MORPHINE SULFATE (PF) 2 MG/ML IV SOLN: 2.0000 mg | INTRAVENOUS | Status: DC | PRN

## 2024-06-07 MED ORDER — METOPROLOL TARTRATE 5 MG/5ML IV SOLN
INTRAVENOUS | Status: DC | PRN
  Administered 2024-06-07 (×2): 1 mg via INTRAVENOUS

## 2024-06-07 MED ORDER — SODIUM CHLORIDE 0.9 % IV BOLUS: 1000.0000 mL | Freq: Once | INTRAVENOUS | Status: DC

## 2024-06-07 MED ORDER — PHENYLEPHRINE 80 MCG/ML (10ML) SYRINGE FOR IV PUSH (FOR BLOOD PRESSURE SUPPORT)
PREFILLED_SYRINGE | INTRAVENOUS | Status: DC | PRN
  Administered 2024-06-07 (×2): 80 ug via INTRAVENOUS

## 2024-06-07 MED ORDER — ONDANSETRON HCL 4 MG/2ML IJ SOLN
4.0000 mg | Freq: Once | INTRAMUSCULAR | Status: AC
  Administered 2024-06-07: 4 mg via INTRAVENOUS
  Filled 2024-06-07: qty 2

## 2024-06-07 MED ORDER — DEXAMETHASONE SOD PHOSPHATE PF 10 MG/ML IJ SOLN
INTRAMUSCULAR | Status: DC | PRN
  Administered 2024-06-07: 5 mg via INTRAVENOUS

## 2024-06-07 MED ORDER — DIPHENHYDRAMINE HCL 50 MG/ML IJ SOLN: 25.0000 mg | Freq: Four times a day (QID) | INTRAMUSCULAR | Status: DC | PRN

## 2024-06-07 MED ORDER — CLONAZEPAM 1 MG PO TABS: 1.0000 mg | ORAL_TABLET | Freq: Three times a day (TID) | ORAL | Status: DC | PRN

## 2024-06-07 MED ORDER — ORAL CARE MOUTH RINSE
15.0000 mL | Freq: Once | OROMUCOSAL | Status: AC
  Filled 2024-06-07: qty 15

## 2024-06-07 MED ORDER — SUGAMMADEX SODIUM 200 MG/2ML IV SOLN
INTRAVENOUS | Status: DC | PRN
  Administered 2024-06-07: 100 mg via INTRAVENOUS

## 2024-06-07 MED ORDER — OXYCODONE HCL 5 MG PO TABS
5.0000 mg | ORAL_TABLET | ORAL | Status: DC | PRN
  Administered 2024-06-07 – 2024-06-09 (×4): 5 mg via ORAL
  Filled 2024-06-07 (×4): qty 1

## 2024-06-07 MED ORDER — ACETAMINOPHEN 325 MG PO TABS
650.0000 mg | ORAL_TABLET | Freq: Four times a day (QID) | ORAL | Status: DC | PRN
  Administered 2024-06-08 (×2): 650 mg via ORAL
  Filled 2024-06-07 (×2): qty 2

## 2024-06-07 MED ORDER — PIPERACILLIN-TAZOBACTAM 3.375 G IVPB
3.3750 g | Freq: Three times a day (TID) | INTRAVENOUS | Status: DC
  Administered 2024-06-07 – 2024-06-09 (×5): 3.375 g via INTRAVENOUS
  Filled 2024-06-07 (×5): qty 50

## 2024-06-07 MED ORDER — ONDANSETRON HCL 4 MG/2ML IJ SOLN
INTRAMUSCULAR | Status: DC | PRN
  Administered 2024-06-07: 4 mg via INTRAVENOUS

## 2024-06-07 MED ORDER — 0.9 % SODIUM CHLORIDE (POUR BTL) OPTIME
TOPICAL | Status: DC | PRN
  Administered 2024-06-07: 1000 mL

## 2024-06-07 MED ORDER — BUPIVACAINE-EPINEPHRINE (PF) 0.25% -1:200000 IJ SOLN
INTRAMUSCULAR | Status: AC
  Filled 2024-06-07: qty 30

## 2024-06-07 MED ORDER — ACETAMINOPHEN 10 MG/ML IV SOLN
INTRAVENOUS | Status: DC | PRN
  Administered 2024-06-07: 1000 mg via INTRAVENOUS

## 2024-06-07 MED ORDER — FENTANYL CITRATE (PF) 250 MCG/5ML IJ SOLN
INTRAMUSCULAR | Status: DC | PRN
  Administered 2024-06-07: 50 ug via INTRAVENOUS
  Administered 2024-06-07: 100 ug via INTRAVENOUS

## 2024-06-07 MED ORDER — ONDANSETRON HCL 4 MG/2ML IJ SOLN
INTRAMUSCULAR | Status: AC
Start: 2024-06-07 — End: 2024-06-07
  Filled 2024-06-07: qty 2

## 2024-06-07 MED ORDER — CHLORHEXIDINE GLUCONATE 0.12 % MT SOLN
OROMUCOSAL | Status: AC
  Filled 2024-06-07: qty 15

## 2024-06-07 MED ORDER — ROCURONIUM BROMIDE 10 MG/ML (PF) SYRINGE
PREFILLED_SYRINGE | INTRAVENOUS | Status: AC
  Filled 2024-06-07: qty 10

## 2024-06-07 MED ORDER — ENOXAPARIN SODIUM 40 MG/0.4ML IJ SOSY
40.0000 mg | PREFILLED_SYRINGE | INTRAMUSCULAR | Status: DC
  Administered 2024-06-08 – 2024-06-09 (×2): 40 mg via SUBCUTANEOUS
  Filled 2024-06-07 (×2): qty 0.4

## 2024-06-07 MED ORDER — CARVEDILOL 12.5 MG PO TABS
12.5000 mg | ORAL_TABLET | Freq: Two times a day (BID) | ORAL | Status: DC
  Administered 2024-06-08 – 2024-06-09 (×3): 12.5 mg via ORAL
  Filled 2024-06-07 (×3): qty 1

## 2024-06-07 MED ORDER — SODIUM CHLORIDE 0.9 % IV BOLUS
500.0000 mL | Freq: Once | INTRAVENOUS | Status: AC
  Administered 2024-06-07: 500 mL via INTRAVENOUS

## 2024-06-07 MED ORDER — IOHEXOL 300 MG/ML  SOLN
100.0000 mL | Freq: Once | INTRAMUSCULAR | Status: AC | PRN
  Administered 2024-06-07: 100 mL via INTRAVENOUS

## 2024-06-07 MED ORDER — FOLIC ACID 1 MG PO TABS
1.0000 mg | ORAL_TABLET | Freq: Every day | ORAL | Status: DC
  Administered 2024-06-08 – 2024-06-09 (×2): 1 mg via ORAL
  Filled 2024-06-07 (×2): qty 1

## 2024-06-07 MED ORDER — HYDROMORPHONE HCL 1 MG/ML IJ SOLN
INTRAMUSCULAR | Status: AC
  Filled 2024-06-07: qty 1

## 2024-06-07 MED ORDER — SODIUM CHLORIDE 0.9 % IR SOLN
Status: DC | PRN
  Administered 2024-06-07: 1000 mL

## 2024-06-07 MED ORDER — MIDAZOLAM HCL 2 MG/2ML IJ SOLN
INTRAMUSCULAR | Status: AC
  Filled 2024-06-07: qty 2

## 2024-06-07 MED ORDER — METOPROLOL TARTRATE 5 MG/5ML IV SOLN
INTRAVENOUS | Status: AC
  Filled 2024-06-07: qty 5

## 2024-06-07 MED ORDER — LIDOCAINE 2% (20 MG/ML) 5 ML SYRINGE
INTRAMUSCULAR | Status: AC
  Filled 2024-06-07: qty 5

## 2024-06-07 MED ORDER — INSULIN ASPART 100 UNIT/ML IJ SOLN
0.0000 [IU] | INTRAMUSCULAR | Status: DC | PRN
  Administered 2024-06-07: 4 [IU] via SUBCUTANEOUS

## 2024-06-07 MED ORDER — POLYETHYLENE GLYCOL 3350 17 G PO PACK: 17.0000 g | PACK | Freq: Every day | ORAL | Status: DC | PRN

## 2024-06-07 MED ORDER — ACETAMINOPHEN 650 MG RE SUPP: 650.0000 mg | Freq: Four times a day (QID) | RECTAL | Status: DC | PRN

## 2024-06-07 MED ORDER — LACTATED RINGERS IV SOLN: INTRAVENOUS | Status: DC

## 2024-06-07 MED ORDER — ATORVASTATIN CALCIUM 80 MG PO TABS
80.0000 mg | ORAL_TABLET | Freq: Every day | ORAL | Status: DC
  Administered 2024-06-07 – 2024-06-08 (×2): 80 mg via ORAL
  Filled 2024-06-07 (×2): qty 1

## 2024-06-07 MED ORDER — DIPHENHYDRAMINE HCL 25 MG PO CAPS: 25.0000 mg | ORAL_CAPSULE | Freq: Four times a day (QID) | ORAL | Status: DC | PRN

## 2024-06-07 MED ORDER — MORPHINE SULFATE (PF) 4 MG/ML IV SOLN
4.0000 mg | Freq: Once | INTRAVENOUS | Status: AC
  Administered 2024-06-07: 4 mg via INTRAVENOUS
  Filled 2024-06-07: qty 1

## 2024-06-07 MED ORDER — HYDROMORPHONE HCL 1 MG/ML IJ SOLN
0.5000 mg | Freq: Once | INTRAMUSCULAR | Status: AC
  Administered 2024-06-07: 0.5 mg via INTRAVENOUS

## 2024-06-07 MED ORDER — PROPOFOL 10 MG/ML IV BOLUS
INTRAVENOUS | Status: AC
  Filled 2024-06-07: qty 20

## 2024-06-07 MED ORDER — ONDANSETRON 4 MG PO TBDP: 4.0000 mg | ORAL_TABLET | Freq: Four times a day (QID) | ORAL | Status: DC | PRN

## 2024-06-07 MED ORDER — PIPERACILLIN-TAZOBACTAM 3.375 G IVPB: 3.3750 g | Freq: Three times a day (TID) | INTRAVENOUS | Status: DC

## 2024-06-07 MED ORDER — BUPIVACAINE-EPINEPHRINE 0.25% -1:200000 IJ SOLN
INTRAMUSCULAR | Status: DC | PRN
  Administered 2024-06-07: 30 mL

## 2024-06-07 MED ORDER — FENOFIBRATE 160 MG PO TABS
160.0000 mg | ORAL_TABLET | Freq: Every day | ORAL | Status: DC
  Administered 2024-06-08 – 2024-06-09 (×2): 160 mg via ORAL
  Filled 2024-06-07 (×2): qty 1

## 2024-06-07 MED ORDER — METOPROLOL TARTRATE 5 MG/5ML IV SOLN: 5.0000 mg | Freq: Four times a day (QID) | INTRAVENOUS | Status: DC | PRN

## 2024-06-07 MED ORDER — ONDANSETRON HCL 4 MG/2ML IJ SOLN
4.0000 mg | Freq: Four times a day (QID) | INTRAMUSCULAR | Status: DC | PRN
Start: 2024-06-07 — End: 2024-06-09

## 2024-06-07 MED ORDER — ACETAMINOPHEN 10 MG/ML IV SOLN
INTRAVENOUS | Status: AC
  Filled 2024-06-07: qty 100

## 2024-06-07 MED ORDER — ROCURONIUM BROMIDE 10 MG/ML (PF) SYRINGE
PREFILLED_SYRINGE | INTRAVENOUS | Status: DC | PRN
  Administered 2024-06-07: 40 mg via INTRAVENOUS

## 2024-06-07 MED ORDER — LIDOCAINE 2% (20 MG/ML) 5 ML SYRINGE
INTRAMUSCULAR | Status: DC | PRN
  Administered 2024-06-07: 100 mg via INTRAVENOUS

## 2024-06-07 MED ORDER — PIPERACILLIN-TAZOBACTAM 3.375 G IVPB 30 MIN
3.3750 g | Freq: Once | INTRAVENOUS | Status: AC
  Administered 2024-06-07: 3.375 g via INTRAVENOUS
  Filled 2024-06-07: qty 50

## 2024-06-07 SURGICAL SUPPLY — 44 items
BAG COUNTER SPONGE SURGICOUNT (BAG) ×1 IMPLANT
CANISTER SUCTION 3000ML PPV (SUCTIONS) ×1 IMPLANT
CHLORAPREP W/TINT 26 (MISCELLANEOUS) ×1 IMPLANT
CLIP APPLIE 5 13 M/L LIGAMAX5 (MISCELLANEOUS) IMPLANT
CLIP LIGATING HEMO LOK XL GOLD (MISCELLANEOUS) ×1 IMPLANT
COVER SURGICAL LIGHT HANDLE (MISCELLANEOUS) ×1 IMPLANT
DERMABOND ADVANCED .7 DNX12 (GAUZE/BANDAGES/DRESSINGS) ×1 IMPLANT
DRSG TEGADERM 4X4.75 (GAUZE/BANDAGES/DRESSINGS) IMPLANT
ELECTRODE REM PT RTRN 9FT ADLT (ELECTROSURGICAL) ×1 IMPLANT
ENDOLOOP SUT PDS II 0 18 (SUTURE) IMPLANT
EVACUATOR SILICONE 100CC (DRAIN) IMPLANT
GAUZE SPONGE 4X4 12PLY STRL (GAUZE/BANDAGES/DRESSINGS) IMPLANT
GLOVE BIOGEL PI IND STRL 7.0 (GLOVE) ×1 IMPLANT
GLOVE SURG SS PI 7.0 STRL IVOR (GLOVE) ×1 IMPLANT
GOWN STRL REUS W/ TWL LRG LVL3 (GOWN DISPOSABLE) ×3 IMPLANT
GRASPER SUT TROCAR 14GX15 (MISCELLANEOUS) ×1 IMPLANT
IRRIGATION SUCT STRKRFLW 2 WTP (MISCELLANEOUS) ×1 IMPLANT
KIT BASIN OR (CUSTOM PROCEDURE TRAY) ×1 IMPLANT
KIT TURNOVER KIT B (KITS) ×1 IMPLANT
NDL 22X1.5 STRL (OR ONLY) (MISCELLANEOUS) ×1 IMPLANT
NDL HYPO 22X1.5 SAFETY MO (MISCELLANEOUS) ×1 IMPLANT
NEEDLE 22X1.5 STRL (OR ONLY) (MISCELLANEOUS) ×1 IMPLANT
NEEDLE HYPO 22X1.5 SAFETY MO (MISCELLANEOUS) ×1 IMPLANT
PAD ARMBOARD POSITIONER FOAM (MISCELLANEOUS) ×2 IMPLANT
POUCH RETRIEVAL ECOSAC 10 (ENDOMECHANICALS) ×1 IMPLANT
RELOAD STAPLE 45 3.6 BLU REG (STAPLE) IMPLANT
RELOAD STAPLE 60 3.6 BLU REG (STAPLE) IMPLANT
SCISSORS LAP 5X35 DISP (ENDOMECHANICALS) ×1 IMPLANT
SET TUBE SMOKE EVAC HIGH FLOW (TUBING) ×1 IMPLANT
SLEEVE Z-THREAD 5X100MM (TROCAR) ×1 IMPLANT
SOLN 0.9% NACL POUR BTL 1000ML (IV SOLUTION) ×1 IMPLANT
SOLN STERILE WATER BTL 1000 ML (IV SOLUTION) ×1 IMPLANT
STAPLE ECHEON FLEX 60 POW ENDO (STAPLE) IMPLANT
STAPLER POWER ECHELON 45 WIDE (STAPLE) IMPLANT
SUT ETHILON 3 0 PS 1 (SUTURE) IMPLANT
SUT MNCRL AB 4-0 PS2 18 (SUTURE) ×1 IMPLANT
SUT VICRYL 0 UR6 27IN ABS (SUTURE) IMPLANT
TOWEL GREEN STERILE (TOWEL DISPOSABLE) ×1 IMPLANT
TOWEL GREEN STERILE FF (TOWEL DISPOSABLE) ×1 IMPLANT
TRAY FOLEY W/BAG SLVR 14FR (SET/KITS/TRAYS/PACK) IMPLANT
TRAY LAPAROSCOPIC MC (CUSTOM PROCEDURE TRAY) ×1 IMPLANT
TROCAR Z THREAD OPTICAL 12X100 (TROCAR) ×1 IMPLANT
TROCAR Z-THREAD OPTICAL 5X100M (TROCAR) ×1 IMPLANT
WARMER LAPAROSCOPE (MISCELLANEOUS) ×1 IMPLANT

## 2024-06-07 NOTE — ED Provider Notes (Signed)
 Received patient in turnover from Dr. Elnor.  Please see their note for further details of Hx, PE.  Briefly patient is a 53 y.o. male with a Abdominal Pain .  Patient with appendicitis on CT.  Awaiting transfer to preop.    Emil Share, DO 06/07/24 1715

## 2024-06-07 NOTE — Anesthesia Preprocedure Evaluation (Addendum)
 Anesthesia Evaluation  Patient identified by MRN, date of birth, ID band Patient awake    Reviewed: Allergy & Precautions, NPO status , Patient's Chart, lab work & pertinent test results, reviewed documented beta blocker date and time   History of Anesthesia Complications Negative for: history of anesthetic complications  Airway Mallampati: III  TM Distance: <3 FB Neck ROM: Full    Dental  (+) Teeth Intact, Dental Advisory Given   Pulmonary neg shortness of breath, neg sleep apnea, neg COPD, neg recent URI, Patient abstained from smoking., former smoker   breath sounds clear to auscultation       Cardiovascular hypertension, Pt. on medications and Pt. on home beta blockers (-) angina + CAD and +CHF   Rhythm:Regular Rate:Tachycardia   1. Left ventricular ejection fraction, by estimation, is 45 to 50%. The  left ventricle has mildly decreased function. The left ventricle  demonstrates global hypokinesis. Left ventricular diastolic parameters are  indeterminate.   2. Right ventricular systolic function is normal. The right ventricular  size is normal. Tricuspid regurgitation signal is inadequate for assessing  PA pressure.   3. The mitral valve is normal in structure. No evidence of mitral valve  regurgitation. No evidence of mitral stenosis.   4. The aortic valve is tricuspid. Aortic valve regurgitation is not  visualized. Aortic valve sclerosis/calcification is present, without any  evidence of aortic stenosis. Aortic valve area, by VTI measures 2.30 cm.  Aortic valve mean gradient measures  2.0 mmHg. Aortic valve Vmax measures 1.06 m/s.   5. The inferior vena cava is normal in size with <50% respiratory  variability, suggesting right atrial pressure of 8 mmHg.    Assessment: 1. Severe CAD with left-dominant system and occluded mLAD 2. Ischemic CM with EF 25% by echo 3. Well compensated filling pressures with moderately  reduced cardiac output   Plan/Discussion:   He has severe ischemic CM with totally occluded mLAD. Will get cMRI to assess viability but on cath there does not appear to be any surgical or PCI targets in LAD. Medical therapy likely only option.    Angel Fuel, MD  1:49 PM    Neuro/Psych  PSYCHIATRIC DISORDERS Anxiety      Neuromuscular disease CVA, Residual Symptoms    GI/Hepatic Neg liver ROS,,,acute appendicitis   Endo/Other  diabetes    Renal/GU negative Renal ROSLab Results      Component                Value               Date                      NA                       133 (L)             06/07/2024                K                        4.1                 06/07/2024                CO2                      27  06/07/2024                GLUCOSE                  221 (H)             06/07/2024                BUN                      9                   06/07/2024                CREATININE               0.96                06/07/2024                CALCIUM                   10.7 (H)            06/07/2024                GFRNONAA                 >60                 06/07/2024                Musculoskeletal negative musculoskeletal ROS (+)    Abdominal   Peds  Hematology negative hematology ROS (+) Lab Results      Component                Value               Date                      WBC                      16.2 (H)            06/07/2024                HGB                      15.8                06/07/2024                HCT                      46.7                06/07/2024                MCV                      88.4                06/07/2024                PLT                      311                 06/07/2024  Anesthesia Other Findings   Reproductive/Obstetrics                              Anesthesia Physical Anesthesia Plan  ASA: 3  Anesthesia Plan: General   Post-op Pain Management: Ofirmev   IV (intra-op)*   Induction: Intravenous and Rapid sequence  PONV Risk Score and Plan: 3 and Ondansetron , Dexamethasone and Midazolam   Airway Management Planned: Oral ETT  Additional Equipment: None  Intra-op Plan:   Post-operative Plan: Extubation in OR  Informed Consent: I have reviewed the patients History and Physical, chart, labs and discussed the procedure including the risks, benefits and alternatives for the proposed anesthesia with the patient or authorized representative who has indicated his/her understanding and acceptance.     Dental advisory given  Plan Discussed with: CRNA  Anesthesia Plan Comments:         Anesthesia Quick Evaluation

## 2024-06-07 NOTE — ED Provider Notes (Signed)
 [place] EMERGENCY DEPARTMENT AT [facility] Provider Note  CSN: [id number] Arrival date & time: 06/07/24 1020  Chief Complaint(s) Abdominal Pain  HPI Angel Davies is a 53 y.o. male with past medical history as below, significant for CHF, CVA with residual left-sided weakness, nephrolithiasis, ADHD, HLD who presents to the ED with complaint of right lower quad abdominal pain.  Onset of symptoms yesterday morning, right lower quadrant pain, progressively worsening.  Sharp, stabbing pain.  Does not seem to radiate.  Poor appetite.  Nausea but no vomiting.  Thought he was constipated took a laxative which did not alleviate his symptoms.  He is voiding without difficulty, no report of bleeding.  No prior abdominal surgeries.  Last p.o. intake was around 8 AM this morning. 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   Chronic systolic heart failure (HCC) 08/07/2017   Encounter for therapeutic drug monitoring 07/27/2017   Muscle pain    Supratherapeutic INR    Leukocytosis    Coronary artery disease involving native coronary artery of native heart without angina pectoris    Prediabetes    Hemorrhoids    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   Left hemiparesis (HCC)    Cardiomyopathy, ischemic    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 Authorizing Provider  amphetamine -dextroamphetamine  (ADDERALL) 20 MG tablet Take 20 mg by mouth 3 (three) times daily.    [provider]  aspirin  EC 81 MG tablet Take 1 tablet (81 mg total) by mouth daily. Swallow whole. 10/25/20   Bensimhon, Toribio SAUNDERS, MD  atorvastatin  (LIPITOR ) 80 MG tablet take 1 tablet by mouth daily at 6 pm 11/11/20   Bensimhon, Toribio SAUNDERS, MD  carvedilol  (COREG ) 12.5 MG tablet TAKE 1 TABLET BY MOUTH 2 TIMES DAILY WITH A MEAL. NEEDS FOLLOW UP APPOINTMENT FOR MORE REFILLS 07/23/23   Bensimhon, Toribio SAUNDERS, MD  clonazePAM  (KLONOPIN ) 1 MG tablet Take 1 mg by mouth as needed. 03/25/19   [provider]  dapagliflozin  propanediol (FARXIGA ) 10 MG TABS tablet TAKE 1 TABLET BY MOUTH EVERY DAY BEFORE BREAKFAST 10/23/22   Bensimhon, Toribio SAUNDERS, MD  Evolocumab  (REPATHA  SURECLICK) 140 MG/ML SOAJ Inject 140 mg into the skin every 14 (fourteen) days. Please call for appointment 05/14/24   Bensimhon, Toribio SAUNDERS, MD  fenofibrate  160 MG tablet TAKE ONE TABLET BY MOUTH ONE TIME DAILY  01/20/19   Rolan Ezra RAMAN, MD  folic acid  (FOLVITE ) 1 MG tablet Take 1 tablet (1 mg total) by mouth daily. 07/27/17   Maurice Sharlet RAMAN, PA-C  Multiple Vitamin (MULTIVITAMIN  WITH MINERALS) TABS tablet Take 1 tablet by mouth daily. 07/27/17   Love, Sharlet RAMAN, PA-C  sacubitril -valsartan  (ENTRESTO ) 97-103 MG Take 1 tablet by mouth 2 (two) times daily. 03/23/23   Bensimhon, Toribio SAUNDERS, MD  spironolactone  (ALDACTONE ) 25 MG tablet TAKE 1/2 TABLET BY MOUTH EVERY DAY AT BEDTIME 04/14/24   Bensimhon, Toribio SAUNDERS, MD  thiamine  100 MG tablet Take 1 tablet (100 mg total) by mouth daily. 07/27/17   Love, Sharlet RAMAN, PA-C  traZODone  (DESYREL ) 50 MG tablet Take 100-150 mg by mouth  at bedtime as needed for sleep.    [provider]                                                                                                                                    Past Surgical History Past Surgical History:  Procedure Laterality Date   CYSTOSCOPY WITH URETEROSCOPY, STONE BASKETRY AND STENT PLACEMENT     RIGHT/LEFT HEART CATH AND CORONARY ANGIOGRAPHY N/A 07/05/2017   Procedure: RIGHT/LEFT HEART CATH AND CORONARY ANGIOGRAPHY;  Surgeon: Cherrie Toribio SAUNDERS, MD;  Location: [facility] INVASIVE [facility] LAB;  Service: Cardiovascular;  Laterality: N/A;   Family History Family History  Problem Relation Age of Onset   Stroke Mother    Colon cancer Mother    Prostate cancer Father    Seizures Neg Hx     Social History Social History   Tobacco Use   Smoking status: Former    Current packs/day: 0.00    Types: Cigarettes, E-cigarettes   Smokeless tobacco: Former  Building Services Engineer status: Former   Substances: Nicotine   Substance Use Topics   Alcohol use: Yes    Alcohol/week: 1.0 standard drink of alcohol    Types: 1 Glasses of wine per week    Comment: not weekly, social    Drug use: Yes    Frequency: 1.0 times per week    Types: Marijuana    Comment: 05/24/21: When I want to be hungry. 07/02/2017 smokes marijuana a couple days/week , 08/20/17 vapes marijuana juice   Allergies Patient has no known allergies.  Review of Systems A thorough review of systems was obtained and all systems are negative except as noted in the HPI and PMH.   Physical Exam Vital Signs  I have reviewed the triage vital signs BP 122/83   Pulse (!) 122   Temp 98.1 F (36.7 C) (Oral)   Resp 14   Ht 5' 9.75 (1.772 m)   Wt 90.3 kg   SpO2 97%   BMI 28.76 kg/m  Physical Exam Vitals and nursing note reviewed.  Constitutional:      General: He is not in acute distress.    Appearance: He is well-developed.  HENT:     Head: Normocephalic and atraumatic.     Right Ear: External ear  normal.     Left Ear: External ear normal.     Mouth/Throat:  Mouth: Mucous membranes are moist.  Eyes:     General: No scleral icterus. Cardiovascular:     Rate and Rhythm: Regular rhythm. Tachycardia present.     Pulses: Normal pulses.     Heart sounds: Normal heart sounds.  Pulmonary:     Effort: Pulmonary effort is normal. No respiratory distress.     Breath sounds: Normal breath sounds.  Abdominal:     General: Abdomen is flat.     Palpations: Abdomen is soft.     Tenderness: There is abdominal tenderness in the right lower quadrant.  Musculoskeletal:     Cervical back: No rigidity.     Right lower leg: No edema.     Left lower leg: No edema.  Skin:    General: Skin is warm and dry.     Capillary Refill: Capillary refill takes less than 2 seconds.  Neurological:     Mental Status: He is alert.  Psychiatric:        Mood and Affect: Mood normal.        Behavior: Behavior normal.     ED Results and Treatments Labs (all labs ordered are listed, but only abnormal results are displayed) Labs Reviewed  CBC WITH DIFFERENTIAL/PLATELET - Abnormal; Notable for the following components:      Result Value   WBC 16.2 (*)    Neutro Abs 11.5 (*)    Monocytes Absolute 2.1 (*)    All other components within normal limits  COMPREHENSIVE METABOLIC PANEL WITH GFR - Abnormal; Notable for the following components:   Sodium 133 (*)    Chloride 92 (*)    Glucose, Bld 221 (*)    Calcium  10.7 (*)    Total Protein 8.7 (*)    All other components within normal limits  LIPASE, BLOOD - Abnormal; Notable for the following components:   Lipase 88 (*)    All other components within normal limits  URINALYSIS, ROUTINE W REFLEX MICROSCOPIC - Abnormal; Notable for the following components:   Specific Gravity, Urine 1.039 (*)    Glucose, UA >1,000 (*)    Hgb urine dipstick LARGE (*)    Ketones, ur TRACE (*)    Protein, ur 100 (*)    Bacteria, UA RARE (*)    All other components within  normal limits  CULTURE, BLOOD (ROUTINE X 2)  CULTURE, BLOOD (ROUTINE X 2)  LACTIC ACID, PLASMA  LACTIC ACID, PLASMA                                                                                                                          Radiology CT ABDOMEN PELVIS W CONTRAST Result Date: 06/07/2024 EXAM: CT ABDOMEN AND PELVIS WITH CONTRAST 06/07/2024 12:30:49 PM TECHNIQUE: CT of the abdomen and pelvis was performed with the administration of 100 mL of iohexol (OMNIPAQUE) 300 MG/ML solution. Multiplanar reformatted images are provided for review. Automated exposure control, iterative reconstruction, and/or weight-based adjustment of the mA/kV was utilized to reduce the  radiation dose to as low as reasonably achievable. COMPARISON: CT 03/18/2004. CLINICAL HISTORY: RLQ abdominal pain. FINDINGS: LOWER CHEST: No acute abnormality. LIVER: Hepatic steatosis. GALLBLADDER AND BILE DUCTS: Gallbladder is unremarkable. No biliary ductal dilatation. SPLEEN: No acute abnormality. PANCREAS: No acute abnormality. ADRENAL GLANDS: No acute abnormality. KIDNEYS, URETERS AND BLADDER: No stones in the kidneys or ureters. No hydronephrosis. No perinephric or periureteral stranding. Urinary bladder is unremarkable. GI AND BOWEL: Stomach demonstrates no acute abnormality. Dilated appendix measuring 15 mm with adjacent inflammatory stranding and fluid. No abscess or evidence of perforation. 6 mm linear appendicolith at the appendiceal base. Reactive inflammation of the terminal ileum. Mild distention of the ascending colon and cecum with air-fluid levels likely due to reactive ileus. There is no bowel obstruction. PERITONEUM AND RETROPERITONEUM: No ascites. No free air. VASCULATURE: Aorta is normal in caliber. LYMPH NODES: No lymphadenopathy. REPRODUCTIVE ORGANS: No acute abnormality. BONES AND SOFT TISSUES: No acute osseous abnormality. No focal soft tissue abnormality. IMPRESSION: 1. Acute uncomplicated appendicitis.  Electronically signed by: Norman Gatlin MD 06/07/2024 12:47 PM EST RP Workstation: [id number]    Pertinent labs & imaging results that were available during my care of the patient were reviewed by me and considered in my medical decision making (see MDM for details).  Medications Ordered in ED Medications  piperacillin-tazobactam (ZOSYN) IVPB 3.375 g (0 g Intravenous Stopped 06/07/24 1351)    Followed by  piperacillin-tazobactam (ZOSYN) IVPB 3.375 g (has no administration in time range)  ondansetron  (ZOFRAN ) injection 4 mg (4 mg Intravenous Given 06/07/24 1204)  morphine (PF) 4 MG/ML injection 4 mg (4 mg Intravenous Given 06/07/24 1205)  sodium chloride  0.9 % bolus 500 mL (0 mLs Intravenous Stopped 06/07/24 1324)  iohexol (OMNIPAQUE) 300 MG/ML solution 100 mL (100 mLs Intravenous Contrast Given 06/07/24 1224)                                                                                                                                     Procedures .Critical Care  Performed by: Elnor Jayson LABOR, DO Authorized by: Elnor Jayson LABOR, DO   Critical care provider statement:    Critical care time (minutes):  30   Critical care time was exclusive of:  Separately billable procedures and treating other patients   Critical care was necessary to treat or prevent imminent or life-threatening deterioration of the following conditions:  Sepsis   Critical care was time spent personally by me on the following activities:  Development of treatment plan with patient or surrogate, discussions with consultants, evaluation of patient's response to treatment, examination of patient, ordering and review of laboratory studies, ordering and review of radiographic studies, ordering and performing treatments and interventions, pulse oximetry, re-evaluation of patient's condition, review of old charts and obtaining history from patient or surrogate   Care discussed with: admitting provider     (including critical care  time)  Medical Decision Making / ED Course  Medical Decision Making:    Angel Davies is a 53 y.o. male with past medical history as below, significant for CHF, CVA with residual left-sided weakness, nephrolithiasis, ADHD, HLD who presents to the ED with complaint of right lower quad abdominal pain. The complaint involves an extensive differential diagnosis and also carries with it a high risk of complications and morbidity.  Serious etiology was considered. Ddx includes but is not limited to: Differential diagnosis includes but is not exclusive to acute appendicitis, renal colic, testicular torsion, urinary tract infection, prostatitis,  diverticulitis, small bowel obstruction, colitis, abdominal aortic aneurysm, gastroenteritis, constipation etc.   Complete initial physical exam performed, notably the patient was in no acute distress, abdomen is acutely tender right lower quadrant, he is tachycardic.    Reviewed and confirmed nursing documentation for past medical history, family history, social history.  Vital signs reviewed.    Right lower quadrant abdominal pain Acute appendicitis> - Abdominal pain last 24 hours, sharp, stabbing, he is not peritoneal. - Labs leukocytosis 16.2 with elevated neutrophils, creatinine stable.  Lipase is mildly elevated, he has no epigastric pain - CT abdomen pelvis w/ acute appendicitis - pt meets for sepsis, he is HDS. Cultures ordered, start zosyn, ivf in progress, he is hemodynamically stable and does not appear to be in septic shock - last po was around 0800 with small amount of yogurt, not anticoagulated - spoke w/ gen surg Dr Stevie who requests pt be send to short stay for operative evaluation, will maintain on their service. - pt/family agreeable to plan, pain greatly improved w/ morphine                       Additional history obtained: -Additional history obtained from spouse -External records from outside source  obtained and reviewed including: Chart review including previous notes, labs, imaging, consultation notes including  Home medications, primary care documentation   Lab Tests: -I ordered, reviewed, and interpreted labs.   The pertinent results include:   Labs Reviewed  CBC WITH DIFFERENTIAL/PLATELET - Abnormal; Notable for the following components:      Result Value   WBC 16.2 (*)    Neutro Abs 11.5 (*)    Monocytes Absolute 2.1 (*)    All other components within normal limits  COMPREHENSIVE METABOLIC PANEL WITH GFR - Abnormal; Notable for the following components:   Sodium 133 (*)    Chloride 92 (*)    Glucose, Bld 221 (*)    Calcium  10.7 (*)    Total Protein 8.7 (*)    All other components within normal limits  LIPASE, BLOOD - Abnormal; Notable for the following components:   Lipase 88 (*)    All other components within normal limits  URINALYSIS, ROUTINE W REFLEX MICROSCOPIC - Abnormal; Notable for the following components:   Specific Gravity, Urine 1.039 (*)    Glucose, UA >1,000 (*)    Hgb urine dipstick LARGE (*)    Ketones, ur TRACE (*)    Protein, ur 100 (*)    Bacteria, UA RARE (*)    All other components within normal limits  CULTURE, BLOOD (ROUTINE X 2)  CULTURE, BLOOD (ROUTINE X 2)  LACTIC ACID, PLASMA  LACTIC ACID, PLASMA    Notable for as above  EKG   EKG Interpretation Date/Time:    Ventricular Rate:    PR Interval:    QRS Duration:    QT Interval:    QTC Calculation:   R Axis:  Text Interpretation:           Imaging Studies ordered: I ordered imaging studies including CT abdomen pelvis I independently visualized the following imaging with scope of interpretation limited to determining acute life threatening conditions related to emergency care; findings noted above I agree with the radiologist interpretation If any imaging was obtained with contrast I closely monitored patient for any possible adverse reaction a/w contrast administration  in the emergency department   Medicines ordered and prescription drug management: Meds ordered this encounter  Medications   ondansetron  (ZOFRAN ) injection 4 mg   DISCONTD: sodium chloride  0.9 % bolus 1,000 mL   morphine (PF) 4 MG/ML injection 4 mg   sodium chloride  0.9 % bolus 500 mL   iohexol (OMNIPAQUE) 300 MG/ML solution 100 mL   FOLLOWED BY Linked Order Group    piperacillin-tazobactam (ZOSYN) IVPB 3.375 g     Antibiotic Indication::   Intra-abdominal Infection    piperacillin-tazobactam (ZOSYN) IVPB 3.375 g    -I have reviewed the patients home medicines and have made adjustments as needed   Consultations Obtained: I requested consultation with the gen surg,  and discussed lab and imaging findings as well as pertinent plan - they recommend: send to short stay   Cardiac Monitoring: The patient was maintained on a cardiac monitor.  I personally viewed and interpreted the cardiac monitored which showed an underlying rhythm of: sinus tachy Continuous pulse oximetry interpreted by myself, 96% on room air.    Social Determinants of Health:  Diagnosis or treatment significantly limited by social determinants of health: former smoker   Reevaluation: After the interventions noted above, I reevaluated the patient and found that they have improved  Co morbidities that complicate the patient evaluation  Past Medical History:  Diagnosis Date   ADHD (attention deficit hyperactivity disorder)    Anxiety    CHF (congestive heart failure) (HCC)    CVA (cerebral vascular accident) (HCC) 07/02/2017   left sided weakness (07/02/2017)   Depression    Diabetes mellitus without complication (HCC)    Type 2   History of kidney stones       Dispostion: Disposition decision including need for hospitalization was considered, and patient admitted to the hospital.    Final Clinical Impression(s) / ED Diagnoses Final diagnoses:  Acute appendicitis, unspecified acute appendicitis  type  Sepsis, due to unspecified organism, unspecified whether acute organ dysfunction present (HCC)        Elnor Jayson LABOR, DO 06/07/24 1531

## 2024-06-07 NOTE — Plan of Care (Signed)

## 2024-06-07 NOTE — Progress Notes (Signed)
 Elink following for sepsis protocol.

## 2024-06-07 NOTE — Anesthesia Procedure Notes (Signed)
 Procedure Name: Intubation Date/Time: 06/07/2024 6:36 PM  Performed by: Marti Acebo C., CRNAPre-anesthesia Checklist: Patient identified, Emergency Drugs available, Suction available, Patient being monitored and Timeout performed Patient Re-evaluated:Patient Re-evaluated prior to induction Oxygen Delivery Method: Circle system utilized Preoxygenation: Pre-oxygenation with 100% oxygen Induction Type: IV induction, Rapid sequence and Cricoid Pressure applied Laryngoscope Size: Mac and 3 Grade View: Grade II Tube type: Oral Tube size: 7.5 mm Number of attempts: 1 Airway Equipment and Method: Stylet Placement Confirmation: ETT inserted through vocal cords under direct vision, positive ETCO2 and breath sounds checked- equal and bilateral Secured at: 23 cm Tube secured with: Tape Dental Injury: Teeth and Oropharynx as per pre-operative assessment

## 2024-06-07 NOTE — Transfer of Care (Signed)
 Immediate Anesthesia Transfer of Care Note  Patient: Lynwood JONETTA Kapur  Procedure(s) Performed: APPENDECTOMY, LAPAROSCOPIC  Patient Location: PACU  Anesthesia Type:General  Level of Consciousness: awake, alert , and oriented  Airway & Oxygen Therapy: Patient Spontanous Breathing and Patient connected to face mask oxygen  Post-op Assessment: Report given to RN and Post -op Vital signs reviewed and stable  Post vital signs: Reviewed and stable  Last Vitals:  Vitals Value Taken Time  BP 144/95 06/07/24 20:02  Temp    Pulse 112 06/07/24 20:05  Resp 20 06/07/24 20:05  SpO2 93 % 06/07/24 20:05  Vitals shown include unfiled device data.  Last Pain:  Vitals:   06/07/24 1643  TempSrc:   PainSc: 3          Complications: No notable events documented.

## 2024-06-07 NOTE — ED Notes (Signed)
 Attempted to call report to pre-op. Left name and number. Nurse to call back for report.

## 2024-06-07 NOTE — Op Note (Signed)
 Preoperative diagnosis: acute appendicitis with perforation  Postoperative diagnosis: Same   Procedure: laparoscopic appendectomy  Surgeon: Herlene Bureau, M.D.  Asst: none  Anesthesia: Gen.   Indications for procedure: Angel Davies is a 53 y.o. male with symptoms of pain in right lower quadrant and nausea consistent with acute appendicitis. Confirmed by CT.  Description of procedure: The patient was brought into the operative suite, placed supine. Anesthesia was administered with endotracheal tube. The patient's left arm was tucked. All pressure points were offloaded by foam padding. The patient was prepped and draped in the usual sterile fashion.  A transverse incision was made to the left of the umbilicus and a 5mm trocar was us . Pneumoperitoneum was applied with high flow low pressure.  2 5mm trocars were placed, one in the suprapubic space, one in the LLQ, the periumbilical incision was then up-sized and a 12mm trocar placed in that space. A transversus abdominal block was placed on the left and right sides. Next, the patient was placed in trendelenberg, rotated to the left. The omentum was retracted cephalad. The cecum and appendix were identified. The appendix was retrocecal with gangrenous change. The White line of [place] was partially taken down to better visualize the appendix. The appendix was bluntly dissected free. The artery tore and was clipped with hemolock. The appendix base was gangrenous and tore during dissection. 2 60 mm Echelon staplers were used to divide a portion of cecum with the appendiceal base.  The appendix was placed in a specimen bag. The pelvis and RLQ were irrigated. No purulence was seen in the pelvis. The appendix was removed via the 12 mm trocar. 0 vicryl was used to close the fascial defect. Pneumoperitoneum was removed, all trocars were removed. All incisions were closed with 4-0 monocryl subcuticular stitch. The patient woke from anesthesia and was brought  to PACU in stable condition.  Findings: perforated appendicitis  Specimen: appendix  Blood loss: 30 ml  Local anesthesia: 30 ml Marcaine  Complications: none  Herlene Bureau, M.D. General, Bariatric, & Minimally Invasive Surgery [facility]

## 2024-06-07 NOTE — H&P (Signed)
 Reason for Consult:abdominal pain Referring Provider: [name] is an 53 y.o. male.  HPI: 53 yo male with 2 days of abdominal pain. He thought it was constipation and took laxatives without improvement. His pain is in the RLQ. It is constant and worsening. He denies nausea or vomiting.  Past Medical History:  Diagnosis Date   ADHD (attention deficit hyperactivity disorder)    Anxiety    CHF (congestive heart failure) (HCC)    CVA (cerebral vascular accident) (HCC) 07/02/2017   left sided weakness (07/02/2017)   Depression    Diabetes mellitus without complication (HCC)    Type 2   History of kidney stones     Past Surgical History:  Procedure Laterality Date   CYSTOSCOPY WITH URETEROSCOPY, STONE BASKETRY AND STENT PLACEMENT     RIGHT/LEFT HEART CATH AND CORONARY ANGIOGRAPHY N/A 07/05/2017   Procedure: RIGHT/LEFT HEART CATH AND CORONARY ANGIOGRAPHY;  Surgeon: Cherrie Toribio SAUNDERS, MD;  Location: [facility];  Service: Cardiovascular;  Laterality: N/A;    Family History  Problem Relation Age of Onset   Stroke Mother    Colon cancer Mother    Prostate cancer Father    Seizures Neg Hx     Social History:  reports that he has quit smoking. His smoking use included cigarettes and e-cigarettes. He has quit using smokeless tobacco. He reports current alcohol use of about 1.0 standard drink of alcohol per week. He reports current drug use. Frequency: 1.00 time per week. Drug: Marijuana.  Allergies: No Known Allergies  Medications: I have reviewed the patient's current medications.  Results for orders placed or performed during the hospital encounter of 06/07/24 (from the past 48 hours)  CBC with Differential     Status: Abnormal   Collection Time: 06/07/24 10:44 AM  Result Value Ref Range   WBC 16.2 (H) 4.0 - 10.5 K/uL   RBC 5.28 4.22 - 5.81 MIL/uL   Hemoglobin 15.8 13.0 - 17.0 g/dL   HCT 53.2 60.9 - 47.9 %   MCV 88.4 80.0 - 100.0 fL   MCH 29.9 26.0  - 34.0 pg   MCHC 33.8 30.0 - 36.0 g/dL   RDW 86.8 88.4 - 84.4 %   Platelets 311 150 - 400 K/uL   nRBC 0.0 0.0 - 0.2 %   Neutrophils Relative % 72 %   Neutro Abs 11.5 (H) 1.7 - 7.7 K/uL   Lymphocytes Relative 15 %   Lymphs Abs 2.5 0.7 - 4.0 K/uL   Monocytes Relative 13 %   Monocytes Absolute 2.1 (H) 0.1 - 1.0 K/uL   Eosinophils Relative 0 %   Eosinophils Absolute 0.0 0.0 - 0.5 K/uL   Basophils Relative 0 %   Basophils Absolute 0.0 0.0 - 0.1 K/uL   Immature Granulocytes 0 %   Abs Immature Granulocytes 0.06 0.00 - 0.07 K/uL    Comment: Performed at [facility], [address] [place], [place] [address]  Comprehensive metabolic panel     Status: Abnormal   Collection Time: 06/07/24 10:44 AM  Result Value Ref Range   Sodium 133 (L) 135 - 145 mmol/L   Potassium 4.1 3.5 - 5.1 mmol/L   Chloride 92 (L) 98 - 111 mmol/L   CO2 27 22 - 32 mmol/L   Glucose, Bld 221 (H) 70 - 99 mg/dL    Comment: Glucose reference range applies only to samples taken after fasting for at least 8 hours.   BUN 9 6 -  20 mg/dL   Creatinine, Ser 9.03 0.61 - 1.24 mg/dL   Calcium  10.7 (H) 8.9 - 10.3 mg/dL   Total Protein 8.7 (H) 6.5 - 8.1 g/dL   Albumin 4.5 3.5 - 5.0 g/dL   AST 26 15 - 41 U/L   ALT 30 0 - 44 U/L   Alkaline Phosphatase 82 38 - 126 U/L   Total Bilirubin 1.0 0.0 - 1.2 mg/dL   GFR, Estimated >39 >39 mL/min    Comment: (NOTE) Calculated using the CKD-EPI Creatinine Equation (2021)    Anion gap 14 5 - 15    Comment: Performed at [facility], [address] [place], [place] [address]  Lipase, blood     Status: Abnormal   Collection Time: 06/07/24 10:44 AM  Result Value Ref Range   Lipase 88 (H) 11 - 51 U/L    Comment: Performed at [facility], [address], [place], [place] [address]  Urinalysis, Routine w reflex microscopic -Urine, Clean Catch     Status: Abnormal   Collection Time: 06/07/24 11:23 AM  Result Value Ref Range    Color, Urine YELLOW YELLOW   APPearance CLEAR CLEAR   Specific Gravity, Urine 1.039 (H) 1.005 - 1.030   pH 5.5 5.0 - 8.0   Glucose, UA >1,000 (A) NEGATIVE mg/dL   Hgb urine dipstick LARGE (A) NEGATIVE   Bilirubin Urine NEGATIVE NEGATIVE   Ketones, ur TRACE (A) NEGATIVE mg/dL   Protein, ur 899 (A) NEGATIVE mg/dL   Nitrite NEGATIVE NEGATIVE   Leukocytes,Ua NEGATIVE NEGATIVE   RBC / HPF >50 0 - 5 RBC/hpf   WBC, UA 11-20 0 - 5 WBC/hpf   Bacteria, UA RARE (A) NONE SEEN   Squamous Epithelial / HPF 0-5 0 - 5 /HPF   Mucus PRESENT    Budding Yeast PRESENT    Hyaline Casts, UA PRESENT     Comment: Performed at [facility], [address], [place], [place] [address]  Lactic acid, plasma     Status: None   Collection Time: 06/07/24  1:24 PM  Result Value Ref Range   Lactic Acid, Venous 1.2 0.5 - 1.9 mmol/L    Comment: Performed at [facility], [address], [place], [place] [address]  Lactic acid, plasma     Status: None   Collection Time: 06/07/24  1:25 PM  Result Value Ref Range   Lactic Acid, Venous 1.9 0.5 - 1.9 mmol/L    Comment: Performed at [facility], [address] [place], [place] [address]    PE Blood pressure 122/83, pulse (!) 122, temperature 98.1 F (36.7 C), temperature source Oral, resp. rate 14, height 5' 9.75 (1.772 m), weight 90.3 kg, SpO2 97%. Constitutional: NAD; conversant; no deformities Eyes: Moist conjunctiva; no lid lag; anicteric; PERRL Neck: Trachea midline; no thyromegaly Lungs: Normal respiratory effort; no tactile fremitus CV: RRR; no palpable thrills; no pitting edema GI: Abd soft, +guarding RLQ; no palpable hepatosplenomegaly MSK: Normal gait; no clubbing/cyanosis Psychiatric: Appropriate affect; alert and oriented x3 Lymphatic: No palpable cervical or axillary lymphadenopathy Skin: No major subcutaneous nodules. Warm and dry   Assessment/Plan: 53 yo male with acute appendicitis. He  has a leukocytosis. I reviewed his Ct scan showing dilation of the appendix with appendicolith. Ct scan also shows large amount liquid in right colon -IV abx -OR for lap appendectomy -we discussed the details of the procedure; that it would be done under general anesthesia, that we would attempt to do the procedure  laparoscopically. That the appendix would be isolated from the large and small intestine and then ligated and removed. We discussed the reason for this is to avoid rupture and infection and resolve the pains. We discussed risks of infection, abscess, injury to intestines or urinary structures, and need for open incision. He showed good understanding and wanted to proceed. -possible discharge from PACU  I reviewed last 24 h vitals and pain scores, last 48 h intake and output, last 24 h labs and trends, and last 24 h imaging results.  This care required high  level of medical decision making.   Herlene Righter Angel Davies 06/07/2024, 4:04 PM

## 2024-06-07 NOTE — ED Triage Notes (Signed)
 RLQ abd pain onset Friday 0500/ Denies nausea or vomiting. Constipation. Took laxative without any results. Last BM Friday and normal.

## 2024-06-07 NOTE — ED Notes (Signed)
 1 set of blood cultures sent to the lab to hold.

## 2024-06-07 NOTE — ED Notes (Signed)
Report called to Cathy in short stay.  

## 2024-06-07 NOTE — Progress Notes (Signed)
 Dr. Leopoldo stated to hold carvedilol .

## 2024-06-08 LAB — CBC
HCT: 41.3 % (ref 39.0–52.0)
Hemoglobin: 13.4 g/dL (ref 13.0–17.0)
MCH: 29.2 pg (ref 26.0–34.0)
MCHC: 32.4 g/dL (ref 30.0–36.0)
MCV: 90 fL (ref 80.0–100.0)
Platelets: 286 K/uL (ref 150–400)
RBC: 4.59 MIL/uL (ref 4.22–5.81)
RDW: 12.9 % (ref 11.5–15.5)
WBC: 15.2 K/uL — ABNORMAL HIGH (ref 4.0–10.5)
nRBC: 0 % (ref 0.0–0.2)

## 2024-06-08 LAB — BASIC METABOLIC PANEL WITH GFR
Anion gap: 15 (ref 5–15)
BUN: 11 mg/dL (ref 6–20)
CO2: 25 mmol/L (ref 22–32)
Calcium: 8.9 mg/dL (ref 8.9–10.3)
Chloride: 95 mmol/L — ABNORMAL LOW (ref 98–111)
Creatinine, Ser: 1.41 mg/dL — ABNORMAL HIGH (ref 0.61–1.24)
GFR, Estimated: 59 mL/min — ABNORMAL LOW (ref >60)
Glucose, Bld: 210 mg/dL — ABNORMAL HIGH (ref 70–99)
Potassium: 4.6 mmol/L (ref 3.5–5.1)
Sodium: 135 mmol/L (ref 135–145)

## 2024-06-08 LAB — GLUCOSE, CAPILLARY
Glucose-Capillary: 181 mg/dL — ABNORMAL HIGH (ref 70–99)
Glucose-Capillary: 248 mg/dL — ABNORMAL HIGH (ref 70–99)
Glucose-Capillary: 222 mg/dL — ABNORMAL HIGH (ref 70–99)

## 2024-06-08 LAB — HEMOGLOBIN A1C
Hgb A1c MFr Bld: 7.9 % — ABNORMAL HIGH (ref 4.8–5.6)
Mean Plasma Glucose: 180.03 mg/dL

## 2024-06-08 MED ORDER — ENSURE PLUS HIGH PROTEIN PO LIQD
237.0000 mL | Freq: Two times a day (BID) | ORAL | Status: DC
  Administered 2024-06-08 – 2024-06-09 (×2): 237 mL via ORAL

## 2024-06-08 MED ORDER — INSULIN ASPART 100 UNIT/ML IJ SOLN
0.0000 [IU] | Freq: Three times a day (TID) | INTRAMUSCULAR | Status: DC
  Administered 2024-06-08: 5 [IU] via SUBCUTANEOUS
  Administered 2024-06-09 (×2): 3 [IU] via SUBCUTANEOUS
  Filled 2024-06-08: qty 3
  Filled 2024-06-08: qty 5
  Filled 2024-06-08: qty 3

## 2024-06-08 MED ORDER — IBUPROFEN 200 MG PO TABS
800.0000 mg | ORAL_TABLET | Freq: Three times a day (TID) | ORAL | Status: DC | PRN
  Administered 2024-06-08 – 2024-06-09 (×2): 800 mg via ORAL
  Filled 2024-06-08 (×2): qty 4

## 2024-06-08 NOTE — Progress Notes (Signed)
  1 Day Post-Op   Chief Complaint/Subjective: Moderate pain over incision, no nausea or vomiting  Objective: Vital signs in last 24 hours: Temp:  [98.1 F (36.7 C)-99.5 F (37.5 C)] 98.5 F (36.9 C) (11/09 0827) Pulse Rate:  [94-126] 97 (11/09 0827) Resp:  [13-25] 20 (11/09 0827) BP: (103-149)/(69-96) 124/74 (11/09 0827) SpO2:  [89 %-97 %] 93 % (11/09 0827) Weight:  [90.3 kg] 90.3 kg (11/08 1029) Last BM Date : 06/06/24 Intake/Output from previous day: 11/08 0701 - 11/09 0700 In: 861.9 [I.V.:811.7; IV Piggyback:50.3] Out: 470 [Urine:430; Drains:25; Blood:15]  PE: Gen: NAD Resp: nonlabored Card: RRR Abd: soft, incision c/d/i  Lab Results:  Recent Labs    06/07/24 2135 06/08/24 0230  WBC 15.6* 15.2*  HGB 13.7 13.4  HCT 40.7 41.3  PLT 277 286   Recent Labs    06/07/24 1044 06/07/24 2135 06/08/24 0230  NA 133*  --  135  K 4.1  --  4.6  CL 92*  --  95*  CO2 27  --  25  GLUCOSE 221*  --  210*  BUN 9  --  11  CREATININE 0.96 1.60* 1.41*  CALCIUM  10.7*  --  8.9   No results for input(s): LABPROT, INR in the last 72 hours.    Component Value Date/Time   NA 135 06/08/2024 0230   K 4.6 06/08/2024 0230   CL 95 (L) 06/08/2024 0230   CO2 25 06/08/2024 0230   GLUCOSE 210 (H) 06/08/2024 0230   BUN 11 06/08/2024 0230   CREATININE 1.41 (H) 06/08/2024 0230   CALCIUM  8.9 06/08/2024 0230   PROT 8.7 (H) 06/07/2024 1044   ALBUMIN 4.5 06/07/2024 1044   AST 26 06/07/2024 1044   ALT 30 06/07/2024 1044   ALKPHOS 82 06/07/2024 1044   BILITOT 1.0 06/07/2024 1044   GFRNONAA 59 (L) 06/08/2024 0230   GFRAA >60 08/13/2018 1407    Assessment/Plan  s/p Procedure(s): APPENDECTOMY, LAPAROSCOPIC 06/07/2024  -perforated appendicitis, drain with bloody output  FEN - reg diet VTE - lovenox  ID - [email or web address] 11/8-> Disposition - inpatient, repeat CBC in am   LOS: 1 day   I reviewed last 24 h vitals and pain scores, last 48 h intake and output, last 24 h labs and trends, and  last 24 h imaging results.  This care required moderate level of medical decision making.   Herlene Righter [facility] Surgery at [facility] 06/08/2024, 8:38 AM Please see Amion for pager number during day hours 7:00am-4:30pm or 7:00am -11:30am on weekends

## 2024-06-08 NOTE — Plan of Care (Signed)

## 2024-06-09 ENCOUNTER — Other Ambulatory Visit (HOSPITAL_COMMUNITY): Payer: Self-pay

## 2024-06-09 ENCOUNTER — Encounter (HOSPITAL_COMMUNITY): Payer: Self-pay | Admitting: General Surgery

## 2024-06-09 LAB — GLUCOSE, CAPILLARY
Glucose-Capillary: 181 mg/dL — ABNORMAL HIGH (ref 70–99)
Glucose-Capillary: 208 mg/dL — ABNORMAL HIGH (ref 70–99)
Glucose-Capillary: 211 mg/dL — ABNORMAL HIGH (ref 70–99)
Glucose-Capillary: 162 mg/dL — ABNORMAL HIGH (ref 70–99)
Glucose-Capillary: 191 mg/dL — ABNORMAL HIGH (ref 70–99)

## 2024-06-09 LAB — CBC
HCT: 36.3 % — ABNORMAL LOW (ref 39.0–52.0)
Hemoglobin: 12 g/dL — ABNORMAL LOW (ref 13.0–17.0)
MCH: 29.6 pg (ref 26.0–34.0)
MCHC: 33.1 g/dL (ref 30.0–36.0)
MCV: 89.6 fL (ref 80.0–100.0)
Platelets: 268 K/uL (ref 150–400)
RBC: 4.05 MIL/uL — ABNORMAL LOW (ref 4.22–5.81)
RDW: 12.8 % (ref 11.5–15.5)
WBC: 10.5 K/uL (ref 4.0–10.5)
nRBC: 0 % (ref 0.0–0.2)

## 2024-06-09 MED ORDER — ACETAMINOPHEN 325 MG PO TABS: 650.0000 mg | ORAL_TABLET | Freq: Four times a day (QID) | ORAL | Status: AC | PRN

## 2024-06-09 MED ORDER — POLYETHYLENE GLYCOL 3350 17 G PO PACK: 17.0000 g | PACK | Freq: Every day | ORAL | Status: AC | PRN

## 2024-06-09 MED ORDER — OXYCODONE HCL 5 MG PO TABS
5.0000 mg | ORAL_TABLET | Freq: Four times a day (QID) | ORAL | 0 refills | Status: AC | PRN
  Filled 2024-06-09: qty 5, 2d supply, fill #0

## 2024-06-09 MED ORDER — IBUPROFEN 800 MG PO TABS
800.0000 mg | ORAL_TABLET | Freq: Three times a day (TID) | ORAL | 0 refills | Status: AC | PRN
  Filled 2024-06-09: qty 30, 10d supply, fill #0

## 2024-06-09 MED ORDER — AMOXICILLIN-POT CLAVULANATE 875-125 MG PO TABS
1.0000 | ORAL_TABLET | Freq: Two times a day (BID) | ORAL | 0 refills | Status: AC
  Filled 2024-06-09: qty 10, 5d supply, fill #0

## 2024-06-09 NOTE — Progress Notes (Signed)
 Reviewed AVS, patient expressed understanding of medications, MD follow up reviewed.   Removed IV, Site clean, dry and intact.  See LDA for information on wounds at discharge.  Patient states all belongings brought to the hospital at time of admission are accounted for and packed to take home.  Picked up medications from [facility] pharmacy.   [facility] contacted to transport patient to entrance A, where family member was waiting in vehicle to transport home.

## 2024-06-09 NOTE — Discharge Instructions (Signed)

## 2024-06-09 NOTE — Progress Notes (Signed)
 Progress Note  2 Days Post-Op  Subjective: Pt reports some abdominal soreness, primarily at LLQ drain site. Passing flatus, no BM. Denies nausea but does not like the hospital food. Would like to go home later today. BP soft overnight but patient denies lightheadedness or dizziness. Reviewed post-op care/instructions.   Objective: Vital signs in last 24 hours: Temp:  [97.7 F (36.5 C)-98.7 F (37.1 C)] 97.7 F (36.5 C) (11/10 0812) Pulse Rate:  [80-97] 87 (11/10 0812) Resp:  [16-20] 16 (11/10 0812) BP: (89-111)/(50-71) 102/51 (11/10 0812) SpO2:  [94 %-97 %] 94 % (11/10 0812) Last BM Date : 06/06/24  Intake/Output from previous day: 11/09 0701 - 11/10 0700 In: 472 [P.O.:472] Out: 340 [Urine:300; Drains:40] Intake/Output this shift: No intake/output data recorded.  PE: General: pleasant, WD, overweight male who is laying in bed in NAD Heart: regular, rate, and rhythm.  Lungs:  Respiratory effort nonlabored Abd: soft, appropriately ttp, mild distention, incisions C/D/I, JP with small volume SS fluid  Psych: A&Ox3 with an appropriate affect.    Lab Results:  Recent Labs    06/08/24 0230 06/09/24 0353  WBC 15.2* 10.5  HGB 13.4 12.0*  HCT 41.3 36.3*  PLT 286 268   BMET Recent Labs    06/07/24 1044 06/07/24 2135 06/08/24 0230  NA 133*  --  135  K 4.1  --  4.6  CL 92*  --  95*  CO2 27  --  25  GLUCOSE 221*  --  210*  BUN 9  --  11  CREATININE 0.96 1.60* 1.41*  CALCIUM  10.7*  --  8.9   PT/INR No results for input(s): LABPROT, INR in the last 72 hours. CMP     Component Value Date/Time   NA 135 06/08/2024 0230   K 4.6 06/08/2024 0230   CL 95 (L) 06/08/2024 0230   CO2 25 06/08/2024 0230   GLUCOSE 210 (H) 06/08/2024 0230   BUN 11 06/08/2024 0230   CREATININE 1.41 (H) 06/08/2024 0230   CALCIUM  8.9 06/08/2024 0230   PROT 8.7 (H) 06/07/2024 1044   ALBUMIN 4.5 06/07/2024 1044   AST 26 06/07/2024 1044   ALT 30 06/07/2024 1044   ALKPHOS 82 06/07/2024  1044   BILITOT 1.0 06/07/2024 1044   GFRNONAA 59 (L) 06/08/2024 0230   GFRAA >60 08/13/2018 1407   Lipase     Component Value Date/Time   LIPASE 88 (H) 06/07/2024 1044       Studies/Results: CT ABDOMEN PELVIS W CONTRAST Result Date: 06/07/2024 EXAM: CT ABDOMEN AND PELVIS WITH CONTRAST 06/07/2024 12:30:49 PM TECHNIQUE: CT of the abdomen and pelvis was performed with the administration of 100 mL of iohexol (OMNIPAQUE) 300 MG/ML solution. Multiplanar reformatted images are provided for review. Automated exposure control, iterative reconstruction, and/or weight-based adjustment of the mA/kV was utilized to reduce the radiation dose to as low as reasonably achievable. COMPARISON: CT 03/18/2004. CLINICAL HISTORY: RLQ abdominal pain. FINDINGS: LOWER CHEST: No acute abnormality. LIVER: Hepatic steatosis. GALLBLADDER AND BILE DUCTS: Gallbladder is unremarkable. No biliary ductal dilatation. SPLEEN: No acute abnormality. PANCREAS: No acute abnormality. ADRENAL GLANDS: No acute abnormality. KIDNEYS, URETERS AND BLADDER: No stones in the kidneys or ureters. No hydronephrosis. No perinephric or periureteral stranding. Urinary bladder is unremarkable. GI AND BOWEL: Stomach demonstrates no acute abnormality. Dilated appendix measuring 15 mm with adjacent inflammatory stranding and fluid. No abscess or evidence of perforation. 6 mm linear appendicolith at the appendiceal base. Reactive inflammation of the terminal ileum. Mild distention of the  ascending colon and cecum with air-fluid levels likely due to reactive ileus. There is no bowel obstruction. PERITONEUM AND RETROPERITONEUM: No ascites. No free air. VASCULATURE: Aorta is normal in caliber. LYMPH NODES: No lymphadenopathy. REPRODUCTIVE ORGANS: No acute abnormality. BONES AND SOFT TISSUES: No acute osseous abnormality. No focal soft tissue abnormality. IMPRESSION: 1. Acute uncomplicated appendicitis. Electronically signed by: Norman Gatlin MD 06/07/2024 12:47  PM EST RP Workstation: [id number]    Anti-infectives: Anti-infectives (From admission, onward)    Start     Dose/Rate Route Frequency Ordered Stop   06/07/24 2200  piperacillin-tazobactam (ZOSYN) IVPB 3.375 g        3.375 g 12.5 mL/hr over 240 Minutes Intravenous Every 8 hours 06/07/24 2103 06/14/24 2159   06/07/24 2000  piperacillin-tazobactam (ZOSYN) IVPB 3.375 g  Status:  Discontinued       Placed in Followed by Linked Group   3.375 g 12.5 mL/hr over 240 Minutes Intravenous Every 8 hours 06/07/24 1252 06/07/24 2114   06/07/24 1300  piperacillin-tazobactam (ZOSYN) IVPB 3.375 g       Placed in Followed by Linked Group   3.375 g 100 mL/hr over 30 Minutes Intravenous  Once 06/07/24 1252 06/07/24 1351        Assessment/Plan  POD2 S/P laparoscopic appendectomy - tolerating diet and passing flatus - JP with 40 cc of SS output for last 24 hrs - discussing home with drain vs removal prior to DC with operative surgeon - BP soft overnight, monitor through AM and if stable then likely DC home this afternoon - WBC normalized and hgb 12 from 13    FEN: reg diet VTE: LMWH ID: Zosyn 11/8>>    LOS: 2 days    Burnard JONELLE Louder, [facility] 06/09/2024, 8:51 AM Please see Amion for pager number during day hours 7:00am-4:30pm

## 2024-06-09 NOTE — Plan of Care (Signed)

## 2024-06-09 NOTE — Discharge Summary (Signed)
 [facility] Discharge Summary   Patient ID: Angel Davies MRN: [id number] DOB/AGE: September 24, 1970 53 y.o.  Admit date: 06/07/2024 Discharge date: 06/09/2024  Admitting Diagnosis: Acute appendicitis   Discharge Diagnosis S/P laparoscopic appendectomy   Consultants None   Imaging: CT ABDOMEN PELVIS W CONTRAST Result Date: 06/07/2024 EXAM: CT ABDOMEN AND PELVIS WITH CONTRAST 06/07/2024 12:30:49 PM TECHNIQUE: CT of the abdomen and pelvis was performed with the administration of 100 mL of iohexol (OMNIPAQUE) 300 MG/ML solution. Multiplanar reformatted images are provided for review. Automated exposure control, iterative reconstruction, and/or weight-based adjustment of the mA/kV was utilized to reduce the radiation dose to as low as reasonably achievable. COMPARISON: CT 03/18/2004. CLINICAL HISTORY: RLQ abdominal pain. FINDINGS: LOWER CHEST: No acute abnormality. LIVER: Hepatic steatosis. GALLBLADDER AND BILE DUCTS: Gallbladder is unremarkable. No biliary ductal dilatation. SPLEEN: No acute abnormality. PANCREAS: No acute abnormality. ADRENAL GLANDS: No acute abnormality. KIDNEYS, URETERS AND BLADDER: No stones in the kidneys or ureters. No hydronephrosis. No perinephric or periureteral stranding. Urinary bladder is unremarkable. GI AND BOWEL: Stomach demonstrates no acute abnormality. Dilated appendix measuring 15 mm with adjacent inflammatory stranding and fluid. No abscess or evidence of perforation. 6 mm linear appendicolith at the appendiceal base. Reactive inflammation of the terminal ileum. Mild distention of the ascending colon and cecum with air-fluid levels likely due to reactive ileus. There is no bowel obstruction. PERITONEUM AND RETROPERITONEUM: No ascites. No free air. VASCULATURE: Aorta is normal in caliber. LYMPH NODES: No lymphadenopathy. REPRODUCTIVE ORGANS: No acute abnormality. BONES AND SOFT TISSUES: No acute osseous abnormality. No focal soft tissue abnormality.  IMPRESSION: 1. Acute uncomplicated appendicitis. Electronically signed by: Norman Gatlin MD 06/07/2024 12:47 PM EST RP Workstation: [id number]    Procedures Dr. Herlene Kinsinger (06/07/24) - Laparoscopic Appendectomy  Hospital Course:  Patient is a 53 year old male who presented to the ED with abdominal pain.  Workup showed acute appendicitis.  Patient was admitted and underwent procedure listed above.  Tolerated procedure well and was transferred to the floor.  Diet was advanced as tolerated.  On POD2, the patient was voiding well, tolerating diet, ambulating well, pain well controlled, vital signs stable, incisions c/d/i and felt stable for discharge home.  Patient will follow up in our office in 3-4 weeks and knows to call with questions or concerns.    I or a member of my team have reviewed this patient in the Controlled Substance Database.   Allergies as of 06/09/2024   No Known Allergies      Medication List     TAKE these medications    acetaminophen  325 MG tablet Commonly known as: TYLENOL  Take 2 tablets (650 mg total) by mouth every 6 (six) hours as needed for mild pain (pain score 1-3) (or temp > 100).   amoxicillin-clavulanate 875-125 MG tablet Commonly known as: AUGMENTIN Take 1 tablet by mouth 2 (two) times daily for 5 days.   amphetamine -dextroamphetamine  20 MG tablet Commonly known as: ADDERALL Take 20 mg by mouth 3 (three) times daily.   aspirin  EC 81 MG tablet Take 1 tablet (81 mg total) by mouth daily. Swallow whole.   atorvastatin  80 MG tablet Commonly known as: LIPITOR  take 1 tablet by mouth daily at 6 pm   b complex vitamins capsule Take 1 capsule by mouth daily.   carvedilol  12.5 MG tablet Commonly known as: COREG  TAKE 1 TABLET BY MOUTH 2 TIMES DAILY WITH A MEAL. NEEDS FOLLOW UP APPOINTMENT FOR MORE REFILLS   clonazePAM  1 MG tablet  Commonly known as: KLONOPIN  Take 1 mg by mouth 3 (three) times daily as needed for anxiety.   Farxiga  10 MG Tabs  tablet Generic drug: dapagliflozin  propanediol TAKE 1 TABLET BY MOUTH EVERY DAY BEFORE BREAKFAST   fenofibrate  160 MG tablet TAKE ONE TABLET BY MOUTH ONE TIME DAILY   folic acid  1 MG tablet Commonly known as: FOLVITE  Take 1 tablet (1 mg total) by mouth daily.   ibuprofen 800 MG tablet Commonly known as: ADVIL Take 1 tablet (800 mg total) by mouth every 8 (eight) hours as needed (1st line for pain). TAKE WITH FOOD   metFORMIN 1000 MG tablet Commonly known as: GLUCOPHAGE Take 1,000 mg by mouth 2 (two) times daily with a meal.   multivitamin with minerals Tabs tablet Take 1 tablet by mouth daily.   oxyCODONE 5 MG immediate release tablet Commonly known as: Oxy IR/ROXICODONE Take 1 tablet (5 mg total) by mouth every 6 (six) hours as needed for moderate pain (pain score 4-6) or severe pain (pain score 7-10).   polyethylene glycol 17 g packet Commonly known as: MIRALAX  / GLYCOLAX  Take 17 g by mouth daily as needed for mild constipation.   Repatha  SureClick 140 MG/ML Soaj Generic drug: Evolocumab  Inject 140 mg into the skin every 14 (fourteen) days. Please call for appointment   spironolactone  25 MG tablet Commonly known as: ALDACTONE  TAKE 1/2 TABLET BY MOUTH EVERY DAY AT BEDTIME   traZODone  50 MG tablet Commonly known as: DESYREL  Take 100-150 mg by mouth at bedtime as needed for sleep.          Follow-up Information     Maczis, Puja Gosai, PA-C. Go on 07/03/2024.   Specialty: General Surgery Why: 3:30 PM, please arrive 30 min prior to appointment time to complete paperwork and check in. Have ID and insurance card with you. Contact information: [address] [place] [address] [facility] [place] [address] [phone]                 Signed: Burnard JONELLE Louder , [facility] 06/09/2024, 10:07 AM Please see Amion for pager number during day hours 7:00am-4:30pm

## 2024-06-10 LAB — SURGICAL PATHOLOGY

## 2024-06-11 DIAGNOSIS — Z9049 Acquired absence of other specified parts of digestive tract: Secondary | ICD-10-CM | POA: Diagnosis not present

## 2024-06-11 DIAGNOSIS — E119 Type 2 diabetes mellitus without complications: Secondary | ICD-10-CM | POA: Diagnosis not present

## 2024-06-11 DIAGNOSIS — I5022 Chronic systolic (congestive) heart failure: Secondary | ICD-10-CM | POA: Diagnosis not present

## 2024-06-11 DIAGNOSIS — E782 Mixed hyperlipidemia: Secondary | ICD-10-CM | POA: Diagnosis not present

## 2024-06-12 LAB — CULTURE, BLOOD (ROUTINE X 2)
Culture: NO GROWTH
Culture: NO GROWTH
Special Requests: ADEQUATE
Special Requests: ADEQUATE

## 2024-06-14 ENCOUNTER — Other Ambulatory Visit: Payer: Self-pay | Admitting: Surgery

## 2024-06-17 NOTE — Anesthesia Postprocedure Evaluation (Signed)
 Anesthesia Post Note  Patient: Angel Davies  Procedure(s) Performed: APPENDECTOMY, LAPAROSCOPIC     Patient location during evaluation: PACU Anesthesia Type: General Level of consciousness: awake and alert Pain management: pain level controlled Vital Signs Assessment: post-procedure vital signs reviewed and stable Respiratory status: spontaneous breathing, nonlabored ventilation and respiratory function stable Cardiovascular status: blood pressure returned to baseline and stable Postop Assessment: no apparent nausea or vomiting Anesthetic complications: no   No notable events documented.                Saara Kijowski

## 2024-06-23 DIAGNOSIS — F3342 Major depressive disorder, recurrent, in full remission: Secondary | ICD-10-CM | POA: Diagnosis not present

## 2024-06-23 DIAGNOSIS — F900 Attention-deficit hyperactivity disorder, predominantly inattentive type: Secondary | ICD-10-CM | POA: Diagnosis not present

## 2024-06-23 DIAGNOSIS — F5101 Primary insomnia: Secondary | ICD-10-CM | POA: Diagnosis not present

## 2024-06-23 DIAGNOSIS — F4321 Adjustment disorder with depressed mood: Secondary | ICD-10-CM | POA: Diagnosis not present

## 2024-07-02 ENCOUNTER — Telehealth (HOSPITAL_COMMUNITY): Payer: Self-pay

## 2024-07-02 NOTE — Progress Notes (Signed)
 [facility] Clinic Note   Primary Care: Dr. Laurent Primary Cardiologist: Dr. Cherrie  HPI:  Angel Davies is a 53 y.o. male with PMH of HTN, ADHD, obesity, anxiety, substance abuse, and depression.   Pt presented 07/02/2017 with CVA. UDS + for cocaine. Found to have new systolic CHF by echo.   Echo 07/02/17 with LVEF 20-25%, grade 2 DD, Trivial MR, Trivial TR -> HF team consulted.  With new CHF, pt underwent R/LHC as below that showed severe 3vD with no options for CABG/CAD. HF meds titrated as tolerated.  S/p appendectomy 11/25. Entresto  stopped? ***  Today he returns for AHF 1 yr follow up. Overall feeling ***. Denies palpitations, CP, dizziness, edema, or PND/Orthopnea. *** SOB. Appetite ok. No fever or chills. Weight at home *** pounds. Taking all medications. Denies ETOH, tobacco or drug use.    Echo  10/25/20 EF 45-50% RV ok  Echo 12/17/17 EF 30-35% No apical thrombus.  Echo 08/02/18 EF 35-40%.  Review of systems complete and found to be negative unless listed in HPI.   cMRI on 07/06/17 with 1) Severe LVE with EF 18% septal and apical akinesis diffuse hypokinesis 2) Apical thrombus measuring 11 mm x 8 mm 3) Full thickness scar involving the mid and distal septum and apex Subendocardial scar involving the basal inferior lateral wall   R/LHC 07/05/17 Mid RCA to Dist RCA lesion is 100% stenosed. Dist Cx lesion is 100% stenosed. Mid LAD lesion is 100% stenosed. Prox Cx to Dist Cx lesion is 30% stenosed.   Findings: Ao = 104/68 (84) LV = 110/9 RA = 1 RV = 19/3 PA = 19/9 (11) PCW = 5 Fick cardiac output/index = 4.3/2.1 PVR = 1.5 WU SVR = 2122 Ao sat = 96% PA sat = 64%, 66%   Assessment: 1. Severe CAD with left-dominant system and occluded mLAD 2. Ischemic CM with EF 25% by echo 3. Well compensated filling pressures with moderately reduced cardiac output  Past Medical History:  Diagnosis Date   ADHD (attention deficit hyperactivity disorder)    Anxiety     CHF (congestive heart failure) (HCC)    CVA (cerebral vascular accident) (HCC) 07/02/2017   left sided weakness (07/02/2017)   Depression    Diabetes mellitus without complication (HCC)    Type 2   History of kidney stones    Current Outpatient Medications  Medication Sig Dispense Refill   acetaminophen  (TYLENOL ) 325 MG tablet Take 2 tablets (650 mg total) by mouth every 6 (six) hours as needed for mild pain (pain score 1-3) (or temp > 100).     amphetamine -dextroamphetamine  (ADDERALL) 20 MG tablet Take 20 mg by mouth 3 (three) times daily.     aspirin  EC 81 MG tablet Take 1 tablet (81 mg total) by mouth daily. Swallow whole. 90 tablet 3   atorvastatin  (LIPITOR ) 80 MG tablet take 1 tablet by mouth daily at 6 pm 90 tablet 3   b complex vitamins capsule Take 1 capsule by mouth daily.     carvedilol  (COREG ) 12.5 MG tablet TAKE 1 TABLET BY MOUTH 2 TIMES DAILY WITH A MEAL. NEEDS FOLLOW UP APPOINTMENT FOR MORE REFILLS 180 tablet 1   clonazePAM  (KLONOPIN ) 1 MG tablet Take 1 mg by mouth 3 (three) times daily as needed for anxiety.     dapagliflozin  propanediol (FARXIGA ) 10 MG TABS tablet TAKE 1 TABLET BY MOUTH EVERY DAY BEFORE BREAKFAST 90 tablet 0   Evolocumab  (REPATHA  SURECLICK) 140 MG/ML SOAJ Inject 140 mg  into the skin every 14 (fourteen) days. Please call for appointment 6 mL 0   fenofibrate  160 MG tablet TAKE ONE TABLET BY MOUTH ONE TIME DAILY  90 tablet 0   folic acid  (FOLVITE ) 1 MG tablet Take 1 tablet (1 mg total) by mouth daily. 30 tablet 0   ibuprofen  (ADVIL ) 800 MG tablet Take 1 tablet (800 mg total) by mouth every 8 (eight) hours as needed (1st line for pain). TAKE WITH FOOD 30 tablet 0   metFORMIN (GLUCOPHAGE) 1000 MG tablet Take 1,000 mg by mouth 2 (two) times daily with a meal.     Multiple Vitamin (MULTIVITAMIN WITH MINERALS) TABS tablet Take 1 tablet by mouth daily. 100 tablet 0   oxyCODONE  (OXY IR/ROXICODONE ) 5 MG immediate release tablet Take 1 tablet (5 mg total) by mouth every  6 (six) hours as needed for moderate pain (pain score 4-6) or severe pain (pain score 7-10). 5 tablet 0   polyethylene glycol (MIRALAX  / GLYCOLAX ) 17 g packet Take 17 g by mouth daily as needed for mild constipation.     spironolactone  (ALDACTONE ) 25 MG tablet TAKE 1/2 TABLET BY MOUTH EVERY DAY AT BEDTIME 45 tablet 3   traZODone  (DESYREL ) 50 MG tablet Take 100-150 mg by mouth at bedtime as needed for sleep.     No current facility-administered medications for this visit.   No Known Allergies  Social History   Socioeconomic History   Marital status: Married    Spouse name: Not on file   Number of children: Not on file   Years of education: Not on file   Highest education level: Not on file  Occupational History   Not on file  Tobacco Use   Smoking status: Former    Current packs/day: 0.00    Types: Cigarettes, E-cigarettes   Smokeless tobacco: Former  Building Services Engineer status: Former   Substances: Nicotine   Substance and Sexual Activity   Alcohol use: Yes    Alcohol/week: 1.0 standard drink of alcohol    Types: 1 Glasses of wine per week    Comment: not weekly, social    Drug use: Yes    Frequency: 1.0 times per week    Types: Marijuana    Comment: 05/24/21: When I want to be hungry. 07/02/2017 smokes marijuana a couple days/week , 08/20/17 vapes marijuana juice   Sexual activity: Yes  Other Topics Concern   Not on file  Social History Narrative   Lives at home with wife and his father lives with them   Right handed   Social Drivers of Health   Financial Resource Strain: Not on file  Food Insecurity: No Food Insecurity (06/07/2024)   Hunger Vital Sign    Worried About Running Out of Food in the Last Year: Never true    Ran Out of Food in the Last Year: Never true  Transportation Needs: No Transportation Needs (06/07/2024)   PRAPARE - Administrator, Civil Service (Medical): No    Lack of Transportation (Non-Medical): No  Physical Activity: Not on  file  Stress: Not on file  Social Connections: Not on file  Intimate Partner Violence: Not At Risk (06/07/2024)   Humiliation, Afraid, Rape, and Kick questionnaire    Fear of Current or Ex-Partner: No    Emotionally Abused: No    Physically Abused: No    Sexually Abused: No   Family History  Problem Relation Age of Onset   Stroke Mother    Colon  cancer Mother    Prostate cancer Father    Seizures Neg Hx    There were no vitals filed for this visit.   Wt Readings from Last 3 Encounters:  06/07/24 90.3 kg (199 lb)  04/30/23 92.3 kg (203 lb 6.4 oz)  05/19/21 92.3 kg (203 lb 6.4 oz)   PHYSICAL EXAM: General:  *** appearing.  No respiratory difficulty Neck: JVD *** cm.  Cor: Regular rate & rhythm. No murmurs. Lungs: clear Extremities: no edema  Neuro: alert & oriented x 3. Affect pleasant.   ECG: *** (Personally reviewed)    ASSESSMENT & PLAN:  1. Chronic systolic CHF, ICM - Echo 07/02/17 with LVEF 20-25%, grade 2 DD, Trivial MR, Trivial TR - cMRI 12/18 EF 18% - EF 12/17/17 ~35% range. Echo 08/02/18: EF 35-40%. Now out of ICD range.  - Echo  10/25/20 EF 45-50% RV ok Personally reviewed - Stable NYHA II - Volume status ok off lasix - Continue Entresto  97/103 mg BID ? *** - Continue carvedilol  to 12.5 mg BID  - Continue spiro 12.5 daily  - Continue Farxiga  - Due for repeat echo   2. CAD - LHC 07/05/17 with severe CAD with left-dominant system and occluded mLAD. - Mostly distal CAD. No good targets for CABG or PCI. Continue medical therapy. Based on scar pattern on MRI  - Continue statin/repatha /ASA - No s/s ischemia - Continue Farxiga   3. CVA - MRI Head 07/02/17 R pontine infarct and sub acute R temporal occipital/PCA territory infarct, along with old left occipital lobe/PCA territory infarct. - LV thrombus has resolved.Off Eliquis    4. Cocaine abuse - Remains abstinent . No change  5. Alcohol abuse - Rare ETOH  6. HLD - Continue atorvastatin  80 mg daily and  repatha .  - Getting Repatha . PCP following lipids. Last LDL = 7 (seven)  - Goal LDL  < 70  Follow up ***  Beckey LITTIE Coe, NP 07/02/24 7:58 PM

## 2024-07-02 NOTE — Telephone Encounter (Signed)
 Called to confirm/remind patient of their appointment at the [facility] Clinic on 07/03/24.   Appointment:   [x] Confirmed  [] Left mess   [] No answer/No voice mail  [] VM Full/unable to leave message  [] Phone not in service  Patient reminded to bring all medications and/or complete list.  Confirmed patient has transportation. Gave directions, instructed to utilize valet parking.

## 2024-07-03 ENCOUNTER — Encounter (HOSPITAL_COMMUNITY): Payer: Self-pay

## 2024-07-03 ENCOUNTER — Other Ambulatory Visit (HOSPITAL_COMMUNITY): Payer: Self-pay

## 2024-07-03 ENCOUNTER — Ambulatory Visit (HOSPITAL_COMMUNITY): Payer: Self-pay | Admitting: Internal Medicine

## 2024-07-03 ENCOUNTER — Ambulatory Visit (HOSPITAL_COMMUNITY): Admission: RE | Admit: 2024-07-03 | Discharge: 2024-07-03 | Attending: Physician Assistant

## 2024-07-03 ENCOUNTER — Telehealth (HOSPITAL_COMMUNITY): Payer: Self-pay

## 2024-07-03 VITALS — BP 146/86 | HR 98 | Ht 69.75 in | Wt 196.6 lb

## 2024-07-03 DIAGNOSIS — I5020 Unspecified systolic (congestive) heart failure: Secondary | ICD-10-CM | POA: Diagnosis not present

## 2024-07-03 DIAGNOSIS — I5022 Chronic systolic (congestive) heart failure: Secondary | ICD-10-CM | POA: Diagnosis not present

## 2024-07-03 DIAGNOSIS — Z87891 Personal history of nicotine dependence: Secondary | ICD-10-CM | POA: Diagnosis not present

## 2024-07-03 DIAGNOSIS — F1410 Cocaine abuse, uncomplicated: Secondary | ICD-10-CM | POA: Diagnosis not present

## 2024-07-03 DIAGNOSIS — I2582 Chronic total occlusion of coronary artery: Secondary | ICD-10-CM | POA: Diagnosis not present

## 2024-07-03 DIAGNOSIS — I2510 Atherosclerotic heart disease of native coronary artery without angina pectoris: Secondary | ICD-10-CM | POA: Diagnosis not present

## 2024-07-03 DIAGNOSIS — E782 Mixed hyperlipidemia: Secondary | ICD-10-CM

## 2024-07-03 DIAGNOSIS — F32A Depression, unspecified: Secondary | ICD-10-CM | POA: Diagnosis not present

## 2024-07-03 DIAGNOSIS — I513 Intracardiac thrombosis, not elsewhere classified: Secondary | ICD-10-CM | POA: Diagnosis not present

## 2024-07-03 DIAGNOSIS — F419 Anxiety disorder, unspecified: Secondary | ICD-10-CM | POA: Diagnosis not present

## 2024-07-03 DIAGNOSIS — Z7982 Long term (current) use of aspirin: Secondary | ICD-10-CM | POA: Diagnosis not present

## 2024-07-03 DIAGNOSIS — Z79899 Other long term (current) drug therapy: Secondary | ICD-10-CM | POA: Diagnosis not present

## 2024-07-03 DIAGNOSIS — Z7984 Long term (current) use of oral hypoglycemic drugs: Secondary | ICD-10-CM | POA: Diagnosis not present

## 2024-07-03 DIAGNOSIS — F1010 Alcohol abuse, uncomplicated: Secondary | ICD-10-CM | POA: Diagnosis not present

## 2024-07-03 DIAGNOSIS — F1090 Alcohol use, unspecified, uncomplicated: Secondary | ICD-10-CM | POA: Diagnosis not present

## 2024-07-03 DIAGNOSIS — I255 Ischemic cardiomyopathy: Secondary | ICD-10-CM | POA: Diagnosis not present

## 2024-07-03 DIAGNOSIS — F1290 Cannabis use, unspecified, uncomplicated: Secondary | ICD-10-CM | POA: Diagnosis not present

## 2024-07-03 DIAGNOSIS — Z8673 Personal history of transient ischemic attack (TIA), and cerebral infarction without residual deficits: Secondary | ICD-10-CM

## 2024-07-03 DIAGNOSIS — F909 Attention-deficit hyperactivity disorder, unspecified type: Secondary | ICD-10-CM | POA: Diagnosis not present

## 2024-07-03 DIAGNOSIS — I110 Hypertensive heart disease with heart failure: Secondary | ICD-10-CM | POA: Diagnosis not present

## 2024-07-03 DIAGNOSIS — E785 Hyperlipidemia, unspecified: Secondary | ICD-10-CM | POA: Diagnosis not present

## 2024-07-03 DIAGNOSIS — E669 Obesity, unspecified: Secondary | ICD-10-CM | POA: Diagnosis not present

## 2024-07-03 DIAGNOSIS — R000 Tachycardia, unspecified: Secondary | ICD-10-CM | POA: Diagnosis not present

## 2024-07-03 LAB — BASIC METABOLIC PANEL WITH GFR
Anion gap: 10 (ref 5–15)
BUN: 11 mg/dL (ref 6–20)
CO2: 26 mmol/L (ref 22–32)
Calcium: 9.8 mg/dL (ref 8.9–10.3)
Chloride: 105 mmol/L (ref 98–111)
Creatinine, Ser: 0.95 mg/dL (ref 0.61–1.24)
GFR, Estimated: >60 mL/min (ref >60)
Glucose, Bld: 119 mg/dL — ABNORMAL HIGH (ref 70–99)
Potassium: 4.7 mmol/L (ref 3.5–5.1)
Sodium: 141 mmol/L (ref 135–145)

## 2024-07-03 LAB — BRAIN NATRIURETIC PEPTIDE: B Natriuretic Peptide: 58.1 pg/mL (ref 0.0–100.0)

## 2024-07-03 MED ORDER — CARVEDILOL 25 MG PO TABS: 25.0000 mg | ORAL_TABLET | Freq: Two times a day (BID) | ORAL | 3 refills | Status: AC

## 2024-07-03 MED ORDER — SACUBITRIL-VALSARTAN 24-26 MG PO TABS: 1.0000 | ORAL_TABLET | Freq: Two times a day (BID) | ORAL | 3 refills | Status: DC

## 2024-07-03 NOTE — Telephone Encounter (Signed)
 Advanced Heart Failure Patient Advocate Encounter  Test billing for this patient's current coverage (Rx BCBS) returns a $25 copay for 30 day supply of Sacubitril -Valsartan . $75 copay for 90 day supply.  This test claim was processed through [facility]- copay amounts may vary at other pharmacies due to pharmacy/plan contracts, or as the patient moves through the different stages of their insurance plan.  Rachel DEL, CPhT Rx Patient Advocate Phone: [phone]

## 2024-07-03 NOTE — Patient Instructions (Signed)
 Medication Changes:  INCREASE Carvedilol  to 25 mg Twice daily   START Entresto  24/26 mg Twice daily   Lab Work:  Labs done today, your results will be available in MyChart, we will contact you for abnormal readings.  Your physician recommends that you return for lab work in: 1 week  Testing/Procedures:  Your physician has requested that you have an echocardiogram. Echocardiography is a painless test that uses sound waves to create images of your heart. It provides your doctor with information about the size and shape of your heart and how well your heart's chambers and valves are working. This procedure takes approximately one hour. There are no restrictions for this procedure. Please do NOT wear cologne, perfume, aftershave, or lotions (deodorant is allowed). Please arrive 15 minutes prior to your appointment time.  Please note: We ask at that you not bring children with you during ultrasound (echo/ vascular) testing. Due to room size and safety concerns, children are not allowed in the ultrasound rooms during exams. Our front office staff cannot provide observation of children in our lobby area while testing is being conducted. An adult accompanying a patient to their appointment will only be allowed in the ultrasound room at the discretion of the ultrasound technician under special circumstances. We apologize for any inconvenience.   Special Instructions // Education:  Do the following things EVERYDAY: Weigh yourself in the morning before breakfast. Write it down and keep it in a log. Take your medicines as prescribed Eat low salt foods--Limit salt (sodium) to 2000 mg per day.  Stay as active as you can everyday Limit all fluids for the day to less than 2 liters   Follow-Up in: 6 weeks   At the [facility] Clinic, you and your health needs are our priority. We have a designated team specialized in the treatment of Heart Failure. This Care Team includes your primary  Heart Failure Specialized Cardiologist (physician), Advanced Practice Providers (APPs- Physician Assistants and Nurse Practitioners), and Pharmacist who all work together to provide you with the care you need, when you need it.   You may see any of the following providers on your designated Care Team at your next follow up:  Dr. [name] Dr. Ezra Shuck Dr. Odis Brownie Greig Mosses, NP Caffie Shed, [place] [facility], [place] Beckey Coe, NP Jordan Lee, NP Tinnie Redman, PharmD   Please be sure to bring in all your medications bottles to every appointment.   Need to Contact Us :  If you have any questions or concerns before your next appointment please send us  a message through [place] or call our office at [phone].    TO LEAVE A MESSAGE FOR THE NURSE SELECT OPTION 2, PLEASE LEAVE A MESSAGE INCLUDING: YOUR NAME DATE OF BIRTH CALL BACK NUMBER REASON FOR CALL**this is important as we prioritize the call backs  YOU WILL RECEIVE A CALL BACK THE SAME DAY AS LONG AS YOU CALL BEFORE 4:00 PM

## 2024-07-10 ENCOUNTER — Ambulatory Visit (HOSPITAL_COMMUNITY): Admission: RE | Admit: 2024-07-10 | Discharge: 2024-07-10 | Attending: Internal Medicine

## 2024-07-10 ENCOUNTER — Encounter (HOSPITAL_COMMUNITY): Payer: Self-pay

## 2024-07-10 DIAGNOSIS — I5022 Chronic systolic (congestive) heart failure: Secondary | ICD-10-CM | POA: Diagnosis not present

## 2024-07-10 LAB — BASIC METABOLIC PANEL WITH GFR
Anion gap: 15 (ref 5–15)
BUN: 10 mg/dL (ref 6–20)
CO2: 22 mmol/L (ref 22–32)
Calcium: 9.2 mg/dL (ref 8.9–10.3)
Chloride: 104 mmol/L (ref 98–111)
Creatinine, Ser: 0.85 mg/dL (ref 0.61–1.24)
GFR, Estimated: >60 mL/min (ref >60)
Glucose, Bld: 113 mg/dL — ABNORMAL HIGH (ref 70–99)
Potassium: 4.1 mmol/L (ref 3.5–5.1)
Sodium: 141 mmol/L (ref 135–145)

## 2024-08-05 ENCOUNTER — Ambulatory Visit (HOSPITAL_COMMUNITY)

## 2024-08-15 ENCOUNTER — Ambulatory Visit (HOSPITAL_COMMUNITY)

## 2024-08-19 ENCOUNTER — Telehealth (HOSPITAL_COMMUNITY): Payer: Self-pay

## 2024-08-19 NOTE — Telephone Encounter (Signed)
 Called to confirm/remind patient of their appointment at the [facility] Clinic on 08/20/24.   Appointment:   [x] Confirmed  [] Left mess   [] No answer/No voice mail  [] VM Full/unable to leave message  [] Phone not in service  Patient reminded to bring all medications and/or complete list.  Confirmed patient has transportation. Gave directions, instructed to utilize valet parking.

## 2024-08-20 ENCOUNTER — Encounter (HOSPITAL_COMMUNITY): Payer: Self-pay

## 2024-08-20 ENCOUNTER — Ambulatory Visit (HOSPITAL_COMMUNITY)
Admission: RE | Admit: 2024-08-20 | Discharge: 2024-08-20 | Disposition: A | Source: Ambulatory Visit | Attending: Internal Medicine

## 2024-08-20 ENCOUNTER — Ambulatory Visit (HOSPITAL_COMMUNITY)
Admission: RE | Admit: 2024-08-20 | Discharge: 2024-08-20 | Disposition: A | Discharge status: Home / Self Care | Source: Ambulatory Visit | Attending: Internal Medicine | Admitting: Internal Medicine

## 2024-08-20 VITALS — BP 138/78 | HR 98 | Ht 69.0 in | Wt 198.8 lb

## 2024-08-20 DIAGNOSIS — F1010 Alcohol abuse, uncomplicated: Secondary | ICD-10-CM | POA: Diagnosis not present

## 2024-08-20 DIAGNOSIS — E782 Mixed hyperlipidemia: Secondary | ICD-10-CM | POA: Diagnosis not present

## 2024-08-20 DIAGNOSIS — I5022 Chronic systolic (congestive) heart failure: Secondary | ICD-10-CM | POA: Diagnosis not present

## 2024-08-20 DIAGNOSIS — F1410 Cocaine abuse, uncomplicated: Secondary | ICD-10-CM | POA: Diagnosis not present

## 2024-08-20 DIAGNOSIS — Z8673 Personal history of transient ischemic attack (TIA), and cerebral infarction without residual deficits: Secondary | ICD-10-CM

## 2024-08-20 DIAGNOSIS — I513 Intracardiac thrombosis, not elsewhere classified: Secondary | ICD-10-CM | POA: Insufficient documentation

## 2024-08-20 DIAGNOSIS — I639 Cerebral infarction, unspecified: Secondary | ICD-10-CM | POA: Insufficient documentation

## 2024-08-20 DIAGNOSIS — Z79899 Other long term (current) drug therapy: Secondary | ICD-10-CM | POA: Diagnosis not present

## 2024-08-20 DIAGNOSIS — I341 Nonrheumatic mitral (valve) prolapse: Secondary | ICD-10-CM

## 2024-08-20 DIAGNOSIS — Z87891 Personal history of nicotine dependence: Secondary | ICD-10-CM | POA: Diagnosis not present

## 2024-08-20 DIAGNOSIS — I255 Ischemic cardiomyopathy: Secondary | ICD-10-CM | POA: Insufficient documentation

## 2024-08-20 DIAGNOSIS — I110 Hypertensive heart disease with heart failure: Secondary | ICD-10-CM | POA: Insufficient documentation

## 2024-08-20 DIAGNOSIS — I2510 Atherosclerotic heart disease of native coronary artery without angina pectoris: Secondary | ICD-10-CM | POA: Insufficient documentation

## 2024-08-20 DIAGNOSIS — E785 Hyperlipidemia, unspecified: Secondary | ICD-10-CM | POA: Insufficient documentation

## 2024-08-20 DIAGNOSIS — F1090 Alcohol use, unspecified, uncomplicated: Secondary | ICD-10-CM

## 2024-08-20 LAB — ECHOCARDIOGRAM COMPLETE
AR max vel: 2.59 cm2
AV Area VTI: 2.66 cm2
AV Area mean vel: 2.87 cm2
AV Mean grad: 3 mmHg
AV Peak grad: 6.6 mmHg
Ao pk vel: 1.28 m/s
Area-P 1/2: 2.73 cm2
Calc EF: 50.5 %
S' Lateral: 3.3 cm
Single Plane A2C EF: 50.4 %
Single Plane A4C EF: 50.6 %

## 2024-08-20 MED ORDER — SACUBITRIL-VALSARTAN 49-51 MG PO TABS: 1.0000 | ORAL_TABLET | Freq: Two times a day (BID) | ORAL | 11 refills | Status: AC

## 2024-08-20 NOTE — Progress Notes (Signed)
 "  Advanced Heart Failure Clinic    Primary Care: Aisha Harvey, MD HF Cardiologist: Dr. Cherrie  HPI: Angel Davies is a 54 y.o. male with PMH of HTN, ADHD, obesity, anxiety, substance abuse, and depression.   Pt presented 07/02/2017 with CVA. UDS + for cocaine. Found to have new systolic CHF by echo.   Echo 07/02/17 with LVEF 20-25%, grade 2 DD, Trivial MR, Trivial TR -> HF team consulted.  With new CHF, pt underwent R/LHC as below that showed severe 3vD with no options for CABG/CAD. HF meds titrated as tolerated.  S/p appendectomy 11/25.    Today he returns for HF follow up. Overall feeling fine. No undue dyspnea with activity. Rare positional dizziness, no syncope or falls. Denies palpitations, abnormal bleeding, CP, edema, or PND/Orthopnea. Appetite ok. Weight at home 200 pounds. Taking all medications. Occasional ETOH and THC, no tobacco. 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  Echo today 08/20/24 shows EF 45-50%, G1DD. RV ok, myxomatous mitral valve  Review of systems complete and found to be negative unless listed in HPI.   Cardiac Studies  - Echo 10/25/20: EF 45-50% RV ok   - Echo 08/02/18: EF 35-40%  - Echo 12/17/17 EF 30-35% No apical thrombus.  - cMRI 12/18: 1) Severe LVE with EF 18% septal and apical akinesis diffuse hypokinesis 2) Apical thrombus measuring 11 mm x 8 mm 3) Full thickness scar involving the mid and distal septum and apex Subendocardial scar involving the basal inferior lateral wall   - R/LHC 07/05/17 Mid RCA to Dist RCA lesion is 100% stenosed. Dist Cx lesion is 100% stenosed. Mid LAD lesion is 100% stenosed. Prox Cx to Dist Cx lesion is 30% stenosed.   Findings: Ao = 104/68 (84) LV = 110/9 RA = 1 RV = 19/3 PA = 19/9 (11) PCW = 5 Fick cardiac output/index = 4.3/2.1 PVR = 1.5 WU SVR = 2122 Ao sat = 96% PA sat = 64%, 66%   Assessment: 1. Severe CAD with left-dominant system and occluded mLAD 2. Ischemic CM with EF 25% by echo 3. Well compensated  filling pressures with moderately reduced cardiac output  Past Medical History:  Diagnosis Date   ADHD (attention deficit hyperactivity disorder)    Anxiety    CHF (congestive heart failure) (HCC)    CVA (cerebral vascular accident) (HCC) 07/02/2017   left sided weakness (07/02/2017)   Depression    Diabetes mellitus without complication (HCC)    Type 2   History of kidney stones    Current Outpatient Medications  Medication Sig Dispense Refill   acetaminophen  (TYLENOL ) 325 MG tablet Take 2 tablets (650 mg total) by mouth every 6 (six) hours as needed for mild pain (pain score 1-3) (or temp > 100).     amphetamine -dextroamphetamine  (ADDERALL) 20 MG tablet Take 20 mg by mouth 3 (three) times daily.     aspirin  EC 81 MG tablet Take 1 tablet (81 mg total) by mouth daily. Swallow whole. 90 tablet 3   atorvastatin  (LIPITOR ) 80 MG tablet take 1 tablet by mouth daily at 6 pm 90 tablet 3   b complex vitamins capsule Take 1 capsule by mouth daily.     carvedilol  (COREG ) 25 MG tablet Take 1 tablet (25 mg total) by mouth 2 (two) times daily with a meal. 60 tablet 3   clonazePAM  (KLONOPIN ) 1 MG tablet Take 1 mg by mouth 3 (three) times daily as needed for anxiety.     dapagliflozin  propanediol (FARXIGA ) 10 MG  TABS tablet TAKE 1 TABLET BY MOUTH EVERY DAY BEFORE BREAKFAST 90 tablet 0   Evolocumab  (REPATHA  SURECLICK) 140 MG/ML SOAJ Inject 140 mg into the skin every 14 (fourteen) days. Please call for appointment 6 mL 0   fenofibrate  160 MG tablet TAKE ONE TABLET BY MOUTH ONE TIME DAILY  90 tablet 0   folic acid  (FOLVITE ) 1 MG tablet Take 1 tablet (1 mg total) by mouth daily. 30 tablet 0   ibuprofen  (ADVIL ) 800 MG tablet Take 1 tablet (800 mg total) by mouth every 8 (eight) hours as needed (1st line for pain). TAKE WITH FOOD 30 tablet 0   metFORMIN (GLUCOPHAGE) 1000 MG tablet Take 1,000 mg by mouth 2 (two) times daily with a meal.     Multiple Vitamin (MULTIVITAMIN WITH MINERALS) TABS tablet Take 1  tablet by mouth daily. 100 tablet 0   oxyCODONE  (OXY IR/ROXICODONE ) 5 MG immediate release tablet Take 1 tablet (5 mg total) by mouth every 6 (six) hours as needed for moderate pain (pain score 4-6) or severe pain (pain score 7-10). 5 tablet 0   polyethylene glycol (MIRALAX  / GLYCOLAX ) 17 g packet Take 17 g by mouth daily as needed for mild constipation.     sacubitril -valsartan  (ENTRESTO ) 49-51 MG Take 1 tablet by mouth 2 (two) times daily. 60 tablet 11   spironolactone  (ALDACTONE ) 25 MG tablet TAKE 1/2 TABLET BY MOUTH EVERY DAY AT BEDTIME 45 tablet 3   traZODone  (DESYREL ) 50 MG tablet Take 100-150 mg by mouth at bedtime as needed for sleep.     No current facility-administered medications for this encounter.   No Known Allergies  Social History   Socioeconomic History   Marital status: Married    Spouse name: Not on file   Number of children: Not on file   Years of education: Not on file   Highest education level: Not on file  Occupational History   Not on file  Tobacco Use   Smoking status: Former    Current packs/day: 0.00    Types: Cigarettes, E-cigarettes   Smokeless tobacco: Former  Building Services Engineer status: Former   Substances: Nicotine   Substance and Sexual Activity   Alcohol use: Yes    Alcohol/week: 1.0 standard drink of alcohol    Types: 1 Glasses of wine per week    Comment: not weekly, social    Drug use: Yes    Frequency: 1.0 times per week    Types: Marijuana    Comment: 05/24/21: When I want to be hungry. 07/02/2017 smokes marijuana a couple days/week , 08/20/17 vapes marijuana juice   Sexual activity: Yes  Other Topics Concern   Not on file  Social History Narrative   Lives at home with wife and his father lives with them   Right handed   Social Drivers of Health   Tobacco Use: Medium Risk (08/20/2024)   Patient History    Smoking Tobacco Use: Former    Smokeless Tobacco Use: Former    Passive Exposure: Not on Actuary Strain:  Not on file  Food Insecurity: No Food Insecurity (06/07/2024)   Epic    Worried About Programme Researcher, Broadcasting/film/video in the Last Year: Never true    Ran Out of Food in the Last Year: Never true  Transportation Needs: No Transportation Needs (06/07/2024)   Epic    Lack of Transportation (Medical): No    Lack of Transportation (Non-Medical): No  Physical Activity: Not on file  Stress: Not on file  Social Connections: Not on file  Intimate Partner Violence: Not At Risk (06/07/2024)   Epic    Fear of Current or Ex-Partner: No    Emotionally Abused: No    Physically Abused: No    Sexually Abused: No  Depression (PHQ2-9): Not on file  Alcohol Screen: Not on file  Housing: Low Risk (06/07/2024)   Epic    Unable to Pay for Housing in the Last Year: No    Number of Times Moved in the Last Year: 0    Homeless in the Last Year: No  Utilities: Not At Risk (06/07/2024)   Epic    Threatened with loss of utilities: No  Health Literacy: Not on file   Family History  Problem Relation Age of Onset   Stroke Mother    Colon cancer Mother    Prostate cancer Father    Seizures Neg Hx    Wt Readings from Last 3 Encounters:  08/20/24 90.2 kg (198 lb 12.8 oz)  07/03/24 89.2 kg (196 lb 9.6 oz)  06/07/24 90.3 kg (199 lb)   BP 138/78 (BP Location: Left Arm)   Pulse 98   Ht 5' 9 (1.753 m)   Wt 90.2 kg (198 lb 12.8 oz)   SpO2 96%   BMI 29.36 kg/m   PHYSICAL EXAM: General:  NAD. No resp difficulty, walked into clinic HEENT: Normal Neck: Supple. No JVD. Cor: Regular rate & rhythm. No rubs, gallops or murmurs. Lungs: Clear Abdomen: Soft, nontender, nondistended.  Extremities: No cyanosis, clubbing, rash, edema Neuro: Alert & oriented x 3, moves all 4 extremities w/o difficulty. Affect pleasant.   ASSESSMENT & PLAN:  1. Chronic systolic CHF, ICM - Echo 07/02/17 with LVEF 20-25%, grade 2 DD, Trivial MR, Trivial TR - cMRI 12/18 EF 18% - Echo 5/19: EF ~35%  - Echo 08/02/18: EF 35-40%. Now out of ICD  range.  - Echo 10/25/20 EF 45-50% RV ok  - Echo today 08/20/24, EF 45-50%, RV ok - Stable NYHA I. Volume status ok  - Increase Entresto  to 49/51 mg bid - Continue Coreg  25 mg bid - Continue spiro 12.5 daily  - Continue Farxiga  10 mg daily. Denies GU symptoms.  - Recent labs reviewed and are stable, check BMET in 10-14 days  2. CAD - LHC 07/05/17 with severe CAD with left-dominant system and occluded mLAD. - Mostly distal CAD. No good targets for CABG or PCI.  - Continue medical therapy. Based on scar pattern on MRI  - No chest pain - Continue ASA - Continue Repatha  and statin  3. CVA - MRI Head 07/02/17 R pontine infarct and sub acute R temporal occipital/PCA territory infarct, along with old left occipital lobe/PCA territory infarct. - LV thrombus has resolved. - Now off Eliquis   4. Cocaine abuse - Remains abstinent.  - No change  5. Alcohol abuse - Rare ETOH  6. HLD - Continue atorvastatin  80 mg daily and Repatha  - PCP following lipids. Last LDL = 7 (seven)  - Goal LDL  < 70  7. Myxomatous Mitral Valve - echo today shows mild prolapse of middle segment of anterior leaflet of mitral valve - No MR or MS - Will follow. Discussed with Dr Rolan  Follow up in 1 year with Dr. Cherrie Angel CHRISTELLA Glena, FNP 08/20/24 2:25 PM  "

## 2024-08-20 NOTE — Patient Instructions (Addendum)
 Thank you for coming in today  If you had labs drawn today, any labs that are abnormal the clinic will call you No news is good news  Medications: Increase Estresto to 49/51 mg 1 tablet twice daily   Follow up appointments:  Your physician recommends that you schedule a follow-up appointment in:  1 year follow With Dr. Cherrie Please call our office to schedule the follow-up appointment in November 2026 for January 2027.    Do the following things EVERYDAY: Weigh yourself in the morning before breakfast. Write it down and keep it in a log. Take your medicines as prescribed Eat low salt foods--Limit salt (sodium) to 2000 mg per day.  Stay as active as you can everyday Limit all fluids for the day to less than 2 liters   At the [facility], you and your health needs are our priority. As part of our continuing mission to provide you with exceptional heart care, we have created designated Provider Care Teams. These Care Teams include your primary Cardiologist (physician) and Advanced Practice Providers (APPs- Physician Assistants and Nurse Practitioners) who all work together to provide you with the care you need, when you need it.   You may see any of the following providers on your designated Care Team at your next follow up: Dr Toribio Cherrie Dr Ezra Shuck Dr. Ria Gardenia Greig Lenetta, NP Caffie Shed, [place] [facility] [place], [place] Beckey Coe, NP Tinnie Redman, PharmD   Please be sure to bring in all your medications bottles to every appointment.    Thank you for choosing [facility]  If you have any questions or concerns before your next appointment please send us  a message through [place] or call our office at [phone].    TO LEAVE A MESSAGE FOR THE NURSE SELECT OPTION 2, PLEASE LEAVE A MESSAGE INCLUDING: YOUR NAME DATE OF BIRTH CALL BACK NUMBER REASON FOR CALL**this is important as we  prioritize the call backs  YOU WILL RECEIVE A CALL BACK THE SAME DAY AS LONG AS YOU CALL BEFORE 4:00 PM

## 2024-09-01 ENCOUNTER — Ambulatory Visit (HOSPITAL_COMMUNITY): Payer: Self-pay | Admitting: Family Medicine

## 2024-09-01 ENCOUNTER — Ambulatory Visit (HOSPITAL_COMMUNITY)
Admission: RE | Admit: 2024-09-01 | Discharge: 2024-09-01 | Disposition: A | Discharge status: Home / Self Care | Source: Ambulatory Visit | Attending: Cardiology | Admitting: Cardiology

## 2024-09-01 DIAGNOSIS — I5022 Chronic systolic (congestive) heart failure: Secondary | ICD-10-CM | POA: Insufficient documentation

## 2024-09-01 LAB — BASIC METABOLIC PANEL WITH GFR
Anion gap: 14 (ref 5–15)
BUN: 14 mg/dL (ref 6–20)
CO2: 24 mmol/L (ref 22–32)
Calcium: 10.5 mg/dL — ABNORMAL HIGH (ref 8.9–10.3)
Chloride: 105 mmol/L (ref 98–111)
Creatinine, Ser: 1.1 mg/dL (ref 0.61–1.24)
GFR, Estimated: >60 mL/min
Glucose, Bld: 123 mg/dL — ABNORMAL HIGH (ref 70–99)
Potassium: 4.5 mmol/L (ref 3.5–5.1)
Sodium: 143 mmol/L (ref 135–145)
